# Patient Record
Sex: Female | Born: 1965 | Race: White | Hispanic: No | Marital: Married | Smoking: Never smoker
Health system: Southern US, Community
[De-identification: ages and names within clinical notes are randomized; demographics above are authoritative.]

## PROBLEM LIST (undated history)

## (undated) DIAGNOSIS — D649 Anemia, unspecified: Secondary | ICD-10-CM

## (undated) DIAGNOSIS — D219 Benign neoplasm of connective and other soft tissue, unspecified: Secondary | ICD-10-CM

## (undated) DIAGNOSIS — R112 Nausea with vomiting, unspecified: Secondary | ICD-10-CM

## (undated) DIAGNOSIS — T8859XA Other complications of anesthesia, initial encounter: Secondary | ICD-10-CM

## (undated) DIAGNOSIS — M419 Scoliosis, unspecified: Secondary | ICD-10-CM

## (undated) DIAGNOSIS — C801 Malignant (primary) neoplasm, unspecified: Secondary | ICD-10-CM

## (undated) DIAGNOSIS — I959 Hypotension, unspecified: Secondary | ICD-10-CM

## (undated) DIAGNOSIS — T7840XA Allergy, unspecified, initial encounter: Secondary | ICD-10-CM

## (undated) DIAGNOSIS — R519 Headache, unspecified: Secondary | ICD-10-CM

## (undated) DIAGNOSIS — Z853 Personal history of malignant neoplasm of breast: Secondary | ICD-10-CM

## (undated) DIAGNOSIS — M1990 Unspecified osteoarthritis, unspecified site: Secondary | ICD-10-CM

## (undated) DIAGNOSIS — A048 Other specified bacterial intestinal infections: Secondary | ICD-10-CM

## (undated) DIAGNOSIS — M069 Rheumatoid arthritis, unspecified: Secondary | ICD-10-CM

## (undated) DIAGNOSIS — T4145XA Adverse effect of unspecified anesthetic, initial encounter: Secondary | ICD-10-CM

## (undated) DIAGNOSIS — Z9889 Other specified postprocedural states: Secondary | ICD-10-CM

## (undated) DIAGNOSIS — R51 Headache: Secondary | ICD-10-CM

## (undated) DIAGNOSIS — R011 Cardiac murmur, unspecified: Secondary | ICD-10-CM

## (undated) DIAGNOSIS — K649 Unspecified hemorrhoids: Secondary | ICD-10-CM

## (undated) DIAGNOSIS — E7800 Pure hypercholesterolemia, unspecified: Secondary | ICD-10-CM

## (undated) DIAGNOSIS — L0591 Pilonidal cyst without abscess: Secondary | ICD-10-CM

## (undated) DIAGNOSIS — N939 Abnormal uterine and vaginal bleeding, unspecified: Secondary | ICD-10-CM

## (undated) DIAGNOSIS — R7309 Other abnormal glucose: Secondary | ICD-10-CM

## (undated) DIAGNOSIS — J45909 Unspecified asthma, uncomplicated: Secondary | ICD-10-CM

## (undated) DIAGNOSIS — J3089 Other allergic rhinitis: Secondary | ICD-10-CM

## (undated) DIAGNOSIS — Z5189 Encounter for other specified aftercare: Secondary | ICD-10-CM

## (undated) DIAGNOSIS — Z8619 Personal history of other infectious and parasitic diseases: Secondary | ICD-10-CM

## (undated) HISTORY — DX: Cardiac murmur, unspecified: R01.1

## (undated) HISTORY — DX: Pure hypercholesterolemia, unspecified: E78.00

## (undated) HISTORY — DX: Personal history of malignant neoplasm of breast: Z85.3

## (undated) HISTORY — PX: RHINOPLASTY: SUR1284

## (undated) HISTORY — DX: Personal history of other infectious and parasitic diseases: Z86.19

## (undated) HISTORY — PX: ENDOMETRIAL BIOPSY: SHX622

## (undated) HISTORY — DX: Scoliosis, unspecified: M41.9

## (undated) HISTORY — DX: Abnormal uterine and vaginal bleeding, unspecified: N93.9

## (undated) HISTORY — PX: WISDOM TOOTH EXTRACTION: SHX21

## (undated) HISTORY — DX: Other specified postprocedural states: Z98.890

## (undated) HISTORY — DX: Rheumatoid arthritis, unspecified: M06.9

## (undated) HISTORY — DX: Other specified bacterial intestinal infections: A04.8

## (undated) HISTORY — DX: Benign neoplasm of connective and other soft tissue, unspecified: D21.9

## (undated) HISTORY — DX: Pilonidal cyst without abscess: L05.91

## (undated) HISTORY — DX: Anemia, unspecified: D64.9

## (undated) HISTORY — DX: Encounter for other specified aftercare: Z51.89

## (undated) HISTORY — DX: Malignant (primary) neoplasm, unspecified: C80.1

## (undated) HISTORY — PX: ABDOMINAL HYSTERECTOMY: SHX81

## (undated) HISTORY — DX: Nausea with vomiting, unspecified: R11.2

## (undated) HISTORY — DX: Unspecified hemorrhoids: K64.9

## (undated) HISTORY — DX: Hypotension, unspecified: I95.9

## (undated) HISTORY — DX: Allergy, unspecified, initial encounter: T78.40XA

## (undated) HISTORY — PX: FOOT SURGERY: SHX648

## (undated) HISTORY — DX: Other abnormal glucose: R73.09

## (undated) HISTORY — PX: APPENDECTOMY: SHX54

---
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
## 2001-01-31 ENCOUNTER — Other Ambulatory Visit: Admission: RE | Admit: 2001-01-31 | Discharge: 2001-01-31 | Payer: Self-pay | Admitting: Obstetrics and Gynecology

## 2001-09-21 HISTORY — PX: EYE SURGERY: SHX253

## 2002-04-07 ENCOUNTER — Other Ambulatory Visit: Admission: RE | Admit: 2002-04-07 | Discharge: 2002-04-07 | Payer: Self-pay | Admitting: Obstetrics and Gynecology

## 2002-09-21 HISTORY — PX: OTHER SURGICAL HISTORY: SHX169

## 2003-06-25 ENCOUNTER — Other Ambulatory Visit: Admission: RE | Admit: 2003-06-25 | Discharge: 2003-06-25 | Payer: Self-pay | Admitting: Obstetrics & Gynecology

## 2004-01-01 ENCOUNTER — Encounter (INDEPENDENT_AMBULATORY_CARE_PROVIDER_SITE_OTHER): Payer: Self-pay | Admitting: Specialist

## 2004-01-01 ENCOUNTER — Inpatient Hospital Stay (HOSPITAL_COMMUNITY): Admission: RE | Admit: 2004-01-01 | Discharge: 2004-01-04 | Payer: Self-pay | Admitting: Obstetrics and Gynecology

## 2004-08-05 ENCOUNTER — Other Ambulatory Visit: Admission: RE | Admit: 2004-08-05 | Discharge: 2004-08-05 | Payer: Self-pay | Admitting: Obstetrics and Gynecology

## 2005-10-21 ENCOUNTER — Other Ambulatory Visit: Admission: RE | Admit: 2005-10-21 | Discharge: 2005-10-21 | Payer: Self-pay | Admitting: Obstetrics and Gynecology

## 2006-01-19 HISTORY — PX: OTHER SURGICAL HISTORY: SHX169

## 2008-02-23 ENCOUNTER — Encounter: Admission: RE | Admit: 2008-02-23 | Discharge: 2008-02-23 | Payer: Self-pay | Admitting: Obstetrics and Gynecology

## 2008-03-19 ENCOUNTER — Encounter: Admission: RE | Admit: 2008-03-19 | Discharge: 2008-03-19 | Payer: Self-pay | Admitting: Obstetrics and Gynecology

## 2008-03-21 ENCOUNTER — Encounter: Admission: RE | Admit: 2008-03-21 | Discharge: 2008-03-21 | Payer: Self-pay | Admitting: Obstetrics and Gynecology

## 2008-03-21 ENCOUNTER — Encounter (INDEPENDENT_AMBULATORY_CARE_PROVIDER_SITE_OTHER): Payer: Self-pay | Admitting: Diagnostic Radiology

## 2008-03-28 ENCOUNTER — Ambulatory Visit: Payer: Self-pay | Admitting: Hematology

## 2008-04-20 ENCOUNTER — Encounter: Admission: RE | Admit: 2008-04-20 | Discharge: 2008-04-20 | Payer: Self-pay | Admitting: Surgery

## 2008-04-23 ENCOUNTER — Encounter (INDEPENDENT_AMBULATORY_CARE_PROVIDER_SITE_OTHER): Payer: Self-pay | Admitting: Surgery

## 2008-04-23 ENCOUNTER — Encounter: Admission: RE | Admit: 2008-04-23 | Discharge: 2008-04-23 | Payer: Self-pay | Admitting: Surgery

## 2008-04-23 ENCOUNTER — Ambulatory Visit (HOSPITAL_BASED_OUTPATIENT_CLINIC_OR_DEPARTMENT_OTHER): Admission: RE | Admit: 2008-04-23 | Discharge: 2008-04-23 | Payer: Self-pay | Admitting: Surgery

## 2008-04-23 HISTORY — PX: BREAST LUMPECTOMY W/ NEEDLE LOCALIZATION: SHX1266

## 2008-04-24 ENCOUNTER — Ambulatory Visit: Payer: Self-pay | Admitting: Oncology

## 2008-05-09 LAB — CBC WITH DIFFERENTIAL/PLATELET
Eosinophils Absolute: 0.4 10*3/uL (ref 0.0–0.5)
HCT: 30.6 % — ABNORMAL LOW (ref 34.8–46.6)
HGB: 10.1 g/dL — ABNORMAL LOW (ref 11.6–15.9)
LYMPH%: 29.8 % (ref 14.0–48.0)
MONO#: 0.3 10*3/uL (ref 0.1–0.9)
NEUT#: 2.7 10*3/uL (ref 1.5–6.5)
NEUT%: 55.6 % (ref 39.6–76.8)
Platelets: 278 10*3/uL (ref 145–400)
WBC: 4.9 10*3/uL (ref 3.9–10.0)
lymph#: 1.5 10*3/uL (ref 0.9–3.3)

## 2008-05-10 ENCOUNTER — Ambulatory Visit: Admission: RE | Admit: 2008-05-10 | Discharge: 2008-06-05 | Payer: Self-pay | Admitting: Radiation Oncology

## 2008-05-10 LAB — COMPREHENSIVE METABOLIC PANEL
ALT: 23 U/L (ref 0–35)
CO2: 27 mEq/L (ref 19–32)
Calcium: 9.6 mg/dL (ref 8.4–10.5)
Chloride: 105 mEq/L (ref 96–112)
Creatinine, Ser: 0.76 mg/dL (ref 0.40–1.20)
Glucose, Bld: 63 mg/dL — ABNORMAL LOW (ref 70–99)
Total Bilirubin: 0.8 mg/dL (ref 0.3–1.2)
Total Protein: 6.9 g/dL (ref 6.0–8.3)

## 2008-05-10 LAB — LACTATE DEHYDROGENASE: LDH: 147 U/L (ref 94–250)

## 2008-05-10 LAB — CANCER ANTIGEN 27.29: CA 27.29: 36 U/mL (ref 0–39)

## 2008-05-15 ENCOUNTER — Ambulatory Visit: Admission: RE | Admit: 2008-05-15 | Discharge: 2008-05-15 | Payer: Self-pay | Admitting: Oncology

## 2008-05-15 ENCOUNTER — Encounter: Payer: Self-pay | Admitting: Oncology

## 2008-05-17 ENCOUNTER — Encounter (INDEPENDENT_AMBULATORY_CARE_PROVIDER_SITE_OTHER): Payer: Self-pay | Admitting: Surgery

## 2008-05-17 ENCOUNTER — Ambulatory Visit (HOSPITAL_BASED_OUTPATIENT_CLINIC_OR_DEPARTMENT_OTHER): Admission: RE | Admit: 2008-05-17 | Discharge: 2008-05-17 | Payer: Self-pay | Admitting: Surgery

## 2008-05-17 HISTORY — PX: BREAST LUMPECTOMY: SHX2

## 2008-05-17 HISTORY — PX: PORTACATH PLACEMENT: SHX2246

## 2008-05-31 LAB — CBC WITH DIFFERENTIAL/PLATELET
Basophils Absolute: 0 10*3/uL (ref 0.0–0.1)
Eosinophils Absolute: 0 10*3/uL (ref 0.0–0.5)
HGB: 11.2 g/dL — ABNORMAL LOW (ref 11.6–15.9)
LYMPH%: 11.1 % — ABNORMAL LOW (ref 14.0–48.0)
MONO#: 0.3 10*3/uL (ref 0.1–0.9)
NEUT#: 7.2 10*3/uL — ABNORMAL HIGH (ref 1.5–6.5)
Platelets: 237 10*3/uL (ref 145–400)
RBC: 4.07 10*6/uL (ref 3.70–5.32)
RDW: 18.2 % — ABNORMAL HIGH (ref 11.3–14.5)
WBC: 8.4 10*3/uL (ref 3.9–10.0)

## 2008-06-08 LAB — CBC WITH DIFFERENTIAL/PLATELET
Eosinophils Absolute: 0.1 10*3/uL (ref 0.0–0.5)
HCT: 32.2 % — ABNORMAL LOW (ref 34.8–46.6)
LYMPH%: 5.2 % — ABNORMAL LOW (ref 14.0–48.0)
MONO#: 0.6 10*3/uL (ref 0.1–0.9)
NEUT#: 27 10*3/uL — ABNORMAL HIGH (ref 1.5–6.5)
NEUT%: 92.3 % — ABNORMAL HIGH (ref 39.6–76.8)
Platelets: 193 10*3/uL (ref 145–400)
WBC: 29.3 10*3/uL — ABNORMAL HIGH (ref 3.9–10.0)

## 2008-06-14 ENCOUNTER — Ambulatory Visit: Payer: Self-pay | Admitting: Oncology

## 2008-06-20 LAB — CBC & DIFF AND RETIC
Basophils Absolute: 0 10*3/uL (ref 0.0–0.1)
Eosinophils Absolute: 0 10*3/uL (ref 0.0–0.5)
HCT: 30.5 % — ABNORMAL LOW (ref 34.8–46.6)
HGB: 10.4 g/dL — ABNORMAL LOW (ref 11.6–15.9)
IRF: 0.21 (ref 0.130–0.330)
LYMPH%: 26.9 % (ref 14.0–48.0)
MCHC: 34.2 g/dL (ref 32.0–36.0)
MONO#: 0.4 10*3/uL (ref 0.1–0.9)
NEUT#: 2.5 10*3/uL (ref 1.5–6.5)
NEUT%: 62.6 % (ref 39.6–76.8)
Platelets: 283 10*3/uL (ref 145–400)
WBC: 4 10*3/uL (ref 3.9–10.0)

## 2008-06-28 LAB — CBC WITH DIFFERENTIAL/PLATELET
EOS%: 0.7 % (ref 0.0–7.0)
HGB: 10.6 g/dL — ABNORMAL LOW (ref 11.6–15.9)
MCH: 28.9 pg (ref 26.0–34.0)
MCV: 83 fL (ref 81.0–101.0)
MONO%: 5.6 % (ref 0.0–13.0)
NEUT#: 18.6 10*3/uL — ABNORMAL HIGH (ref 1.5–6.5)
RBC: 3.68 10*6/uL — ABNORMAL LOW (ref 3.70–5.32)
RDW: 18.7 % — ABNORMAL HIGH (ref 11.3–14.5)
lymph#: 1.8 10*3/uL (ref 0.9–3.3)

## 2008-07-12 LAB — CBC & DIFF AND RETIC
Basophils Absolute: 0.1 10*3/uL (ref 0.0–0.1)
Eosinophils Absolute: 0 10*3/uL (ref 0.0–0.5)
HGB: 11.2 g/dL — ABNORMAL LOW (ref 11.6–15.9)
IRF: 0.31 (ref 0.130–0.330)
MONO#: 0.6 10*3/uL (ref 0.1–0.9)
NEUT#: 7 10*3/uL — ABNORMAL HIGH (ref 1.5–6.5)
RBC: 3.72 10*6/uL (ref 3.70–5.32)
RDW: 25 % — ABNORMAL HIGH (ref 11.3–14.5)
RETIC #: 86.3 10*3/uL (ref 19.7–115.1)
Retic %: 2.3 % (ref 0.4–2.3)
WBC: 8.9 10*3/uL (ref 3.9–10.0)
lymph#: 1.2 10*3/uL (ref 0.9–3.3)

## 2008-07-20 LAB — CBC WITH DIFFERENTIAL/PLATELET
Eosinophils Absolute: 0.1 10*3/uL (ref 0.0–0.5)
MCV: 86 fL (ref 81.0–101.0)
MONO#: 1.5 10*3/uL — ABNORMAL HIGH (ref 0.1–0.9)
MONO%: 5.7 % (ref 0.0–13.0)
NEUT#: 22.6 10*3/uL — ABNORMAL HIGH (ref 1.5–6.5)
RBC: 3.65 10*6/uL — ABNORMAL LOW (ref 3.70–5.32)
RDW: 18.3 % — ABNORMAL HIGH (ref 11.3–14.5)
WBC: 26.2 10*3/uL — ABNORMAL HIGH (ref 3.9–10.0)
lymph#: 1.8 10*3/uL (ref 0.9–3.3)

## 2008-07-31 ENCOUNTER — Ambulatory Visit: Payer: Self-pay | Admitting: Oncology

## 2008-08-02 LAB — CBC WITH DIFFERENTIAL/PLATELET
BASO%: 0.1 % (ref 0.0–2.0)
Basophils Absolute: 0 10*3/uL (ref 0.0–0.1)
EOS%: 0.1 % (ref 0.0–7.0)
HCT: 33.8 % — ABNORMAL LOW (ref 34.8–46.6)
HGB: 11.6 g/dL (ref 11.6–15.9)
MCH: 30.5 pg (ref 26.0–34.0)
MCHC: 34.2 g/dL (ref 32.0–36.0)
MCV: 89.1 fL (ref 81.0–101.0)
MONO%: 2.9 % (ref 0.0–13.0)
NEUT%: 83.2 % — ABNORMAL HIGH (ref 39.6–76.8)
RDW: 18.8 % — ABNORMAL HIGH (ref 11.3–14.5)
lymph#: 1 10*3/uL (ref 0.9–3.3)

## 2008-08-09 LAB — MANUAL DIFFERENTIAL
ALC: 1.4 10*3/uL (ref 0.9–3.3)
ANC (CHCC manual diff): 6.1 10*3/uL (ref 1.5–6.5)
Basophil: 2 % (ref 0–2)
Blasts: 0 % (ref 0–0)
Metamyelocytes: 4 % — ABNORMAL HIGH (ref 0–0)
Myelocytes: 1 % — ABNORMAL HIGH (ref 0–0)
PLT EST: DECREASED
PROMYELO: 0 % (ref 0–0)
Variant Lymph: 0 % (ref 0–0)

## 2008-08-09 LAB — CBC WITH DIFFERENTIAL/PLATELET
MCH: 31.3 pg (ref 26.0–34.0)
MCHC: 35.8 g/dL (ref 32.0–36.0)
RDW: 16.4 % — ABNORMAL HIGH (ref 11.3–14.5)

## 2008-08-14 ENCOUNTER — Encounter: Payer: Self-pay | Admitting: Oncology

## 2008-08-14 ENCOUNTER — Ambulatory Visit: Admission: RE | Admit: 2008-08-14 | Discharge: 2008-08-14 | Payer: Self-pay | Admitting: Oncology

## 2008-08-21 DIAGNOSIS — C801 Malignant (primary) neoplasm, unspecified: Secondary | ICD-10-CM

## 2008-08-21 HISTORY — DX: Malignant (primary) neoplasm, unspecified: C80.1

## 2008-08-23 LAB — CBC & DIFF AND RETIC
Basophils Absolute: 0.1 10*3/uL (ref 0.0–0.1)
EOS%: 1.5 % (ref 0.0–7.0)
Eosinophils Absolute: 0.1 10*3/uL (ref 0.0–0.5)
HCT: 34.1 % — ABNORMAL LOW (ref 34.8–46.6)
HGB: 11.9 g/dL (ref 11.6–15.9)
IRF: 0.37 — ABNORMAL HIGH (ref 0.130–0.330)
MCH: 32 pg (ref 26.0–34.0)
MONO#: 0.5 10*3/uL (ref 0.1–0.9)
NEUT#: 4.5 10*3/uL (ref 1.5–6.5)
NEUT%: 70.5 % (ref 39.6–76.8)
RDW: 18 % — ABNORMAL HIGH (ref 11.3–14.5)
RETIC #: 62 10*3/uL (ref 19.7–115.1)
lymph#: 1.2 10*3/uL (ref 0.9–3.3)

## 2008-09-11 ENCOUNTER — Ambulatory Visit (HOSPITAL_COMMUNITY): Admission: RE | Admit: 2008-09-11 | Discharge: 2008-09-12 | Payer: Self-pay | Admitting: Surgery

## 2008-09-11 ENCOUNTER — Encounter (INDEPENDENT_AMBULATORY_CARE_PROVIDER_SITE_OTHER): Payer: Self-pay | Admitting: Surgery

## 2008-09-11 HISTORY — PX: INSERTION OF TISSUE EXPANDER AFTER MASTECTOMY: SHX1831

## 2008-09-11 HISTORY — PX: MASTECTOMY: SHX3

## 2008-09-17 ENCOUNTER — Ambulatory Visit: Payer: Self-pay | Admitting: Oncology

## 2008-09-19 LAB — COMPREHENSIVE METABOLIC PANEL
AST: 17 U/L (ref 0–37)
Albumin: 3.7 g/dL (ref 3.5–5.2)
BUN: 11 mg/dL (ref 6–23)
CO2: 29 mEq/L (ref 19–32)
Calcium: 8.8 mg/dL (ref 8.4–10.5)
Chloride: 103 mEq/L (ref 96–112)
Creatinine, Ser: 0.66 mg/dL (ref 0.40–1.20)
Potassium: 4.1 mEq/L (ref 3.5–5.3)

## 2008-09-19 LAB — CANCER ANTIGEN 27.29: CA 27.29: 21 U/mL (ref 0–39)

## 2008-09-19 LAB — CBC WITH DIFFERENTIAL/PLATELET
Basophils Absolute: 0 10*3/uL (ref 0.0–0.1)
Eosinophils Absolute: 0.8 10*3/uL — ABNORMAL HIGH (ref 0.0–0.5)
HCT: 31.9 % — ABNORMAL LOW (ref 34.8–46.6)
HGB: 11.2 g/dL — ABNORMAL LOW (ref 11.6–15.9)
MONO#: 0.3 10*3/uL (ref 0.1–0.9)
NEUT#: 3.9 10*3/uL (ref 1.5–6.5)
NEUT%: 64.1 % (ref 39.6–76.8)
RDW: 13.5 % (ref 11.3–14.5)
WBC: 6.1 10*3/uL (ref 3.9–10.0)
lymph#: 1.1 10*3/uL (ref 0.9–3.3)

## 2008-09-21 HISTORY — PX: KNEE ARTHROSCOPY W/ MENISCAL REPAIR: SHX1877

## 2008-10-02 LAB — ESTRADIOL, ULTRA SENS

## 2008-10-24 ENCOUNTER — Ambulatory Visit: Admission: RE | Admit: 2008-10-24 | Discharge: 2008-10-24 | Payer: Self-pay | Admitting: Oncology

## 2008-10-24 ENCOUNTER — Encounter: Payer: Self-pay | Admitting: Oncology

## 2008-10-24 ENCOUNTER — Ambulatory Visit: Payer: Self-pay | Admitting: Cardiovascular Disease

## 2008-10-29 ENCOUNTER — Ambulatory Visit: Payer: Self-pay | Admitting: Oncology

## 2008-11-29 LAB — URIC ACID: Uric Acid, Serum: 5.2 mg/dL (ref 2.4–7.0)

## 2008-12-19 ENCOUNTER — Ambulatory Visit: Payer: Self-pay | Admitting: Oncology

## 2008-12-20 HISTORY — PX: REMOVAL OF TISSUE EXPANDER AND PLACEMENT OF IMPLANT: SHX6457

## 2008-12-24 ENCOUNTER — Ambulatory Visit (HOSPITAL_COMMUNITY): Admission: RE | Admit: 2008-12-24 | Discharge: 2008-12-24 | Payer: Self-pay | Admitting: Oncology

## 2008-12-24 LAB — COMPREHENSIVE METABOLIC PANEL
ALT: 15 U/L (ref 0–35)
AST: 17 U/L (ref 0–37)
Albumin: 4.1 g/dL (ref 3.5–5.2)
Alkaline Phosphatase: 53 U/L (ref 39–117)
Calcium: 9.3 mg/dL (ref 8.4–10.5)
Chloride: 106 mEq/L (ref 96–112)
Creatinine, Ser: 0.69 mg/dL (ref 0.40–1.20)
Potassium: 3.9 mEq/L (ref 3.5–5.3)

## 2008-12-24 LAB — CBC WITH DIFFERENTIAL/PLATELET
BASO%: 0.2 % (ref 0.0–2.0)
EOS%: 2.7 % (ref 0.0–7.0)
MCH: 31.2 pg (ref 25.1–34.0)
MCHC: 34.8 g/dL (ref 31.5–36.0)
MONO%: 7.2 % (ref 0.0–14.0)
RDW: 14.4 % (ref 11.2–14.5)
lymph#: 1.6 10*3/uL (ref 0.9–3.3)

## 2008-12-31 ENCOUNTER — Encounter: Admission: RE | Admit: 2008-12-31 | Discharge: 2008-12-31 | Payer: Self-pay | Admitting: Orthopedic Surgery

## 2009-01-23 LAB — RESEARCH LABS

## 2009-01-23 LAB — HCG, SERUM, QUALITATIVE: Preg, Serum: NEGATIVE

## 2009-02-11 ENCOUNTER — Ambulatory Visit: Payer: Self-pay | Admitting: Oncology

## 2009-03-22 ENCOUNTER — Ambulatory Visit: Payer: Self-pay | Admitting: Oncology

## 2009-04-17 ENCOUNTER — Ambulatory Visit (HOSPITAL_COMMUNITY): Admission: RE | Admit: 2009-04-17 | Discharge: 2009-04-17 | Payer: Self-pay | Admitting: Oncology

## 2009-04-18 LAB — COMPREHENSIVE METABOLIC PANEL
Albumin: 3.5 g/dL (ref 3.5–5.2)
CO2: 28 mEq/L (ref 19–32)
Calcium: 8.6 mg/dL (ref 8.4–10.5)
Chloride: 107 mEq/L (ref 96–112)
Glucose, Bld: 80 mg/dL (ref 70–99)
Potassium: 3.9 mEq/L (ref 3.5–5.3)
Sodium: 139 mEq/L (ref 135–145)
Total Protein: 6.2 g/dL (ref 6.0–8.3)

## 2009-04-18 LAB — CBC WITH DIFFERENTIAL/PLATELET
Eosinophils Absolute: 0.1 10*3/uL (ref 0.0–0.5)
LYMPH%: 34.1 % (ref 14.0–49.7)
MONO#: 0.4 10*3/uL (ref 0.1–0.9)
NEUT#: 2 10*3/uL (ref 1.5–6.5)
Platelets: 177 10*3/uL (ref 145–400)
RBC: 3.79 10*6/uL (ref 3.70–5.45)
RDW: 13 % (ref 11.2–14.5)
WBC: 3.8 10*3/uL — ABNORMAL LOW (ref 3.9–10.3)

## 2009-05-07 ENCOUNTER — Ambulatory Visit: Payer: Self-pay | Admitting: Oncology

## 2009-05-30 LAB — CBC WITH DIFFERENTIAL/PLATELET
Basophils Absolute: 0 10*3/uL (ref 0.0–0.1)
Eosinophils Absolute: 0.2 10*3/uL (ref 0.0–0.5)
HCT: 37.9 % (ref 34.8–46.6)
HGB: 13 g/dL (ref 11.6–15.9)
LYMPH%: 30.4 % (ref 14.0–49.7)
MCV: 95.8 fL (ref 79.5–101.0)
MONO%: 9.8 % (ref 0.0–14.0)
NEUT#: 2.2 10*3/uL (ref 1.5–6.5)
NEUT%: 54.9 % (ref 38.4–76.8)
Platelets: 187 10*3/uL (ref 145–400)

## 2009-05-30 LAB — COMPREHENSIVE METABOLIC PANEL
Albumin: 3.7 g/dL (ref 3.5–5.2)
Alkaline Phosphatase: 39 U/L (ref 39–117)
BUN: 11 mg/dL (ref 6–23)
Glucose, Bld: 53 mg/dL — ABNORMAL LOW (ref 70–99)
Potassium: 4.4 mEq/L (ref 3.5–5.3)

## 2009-06-17 ENCOUNTER — Ambulatory Visit: Payer: Self-pay | Admitting: Oncology

## 2009-08-28 ENCOUNTER — Ambulatory Visit (HOSPITAL_BASED_OUTPATIENT_CLINIC_OR_DEPARTMENT_OTHER): Admission: RE | Admit: 2009-08-28 | Discharge: 2009-08-28 | Payer: Self-pay | Admitting: Surgery

## 2009-09-06 ENCOUNTER — Ambulatory Visit (HOSPITAL_COMMUNITY): Admission: RE | Admit: 2009-09-06 | Discharge: 2009-09-06 | Payer: Self-pay | Admitting: Oncology

## 2009-09-06 ENCOUNTER — Ambulatory Visit: Payer: Self-pay | Admitting: Oncology

## 2009-09-09 HISTORY — PX: PORT-A-CATH REMOVAL: SHX5289

## 2009-09-09 HISTORY — PX: UMBILICAL HERNIA REPAIR: SHX196

## 2009-09-10 LAB — COMPREHENSIVE METABOLIC PANEL
Albumin: 3.8 g/dL (ref 3.5–5.2)
Alkaline Phosphatase: 36 U/L — ABNORMAL LOW (ref 39–117)
BUN: 8 mg/dL (ref 6–23)
CO2: 30 mEq/L (ref 19–32)
Chloride: 106 mEq/L (ref 96–112)
Glucose, Bld: 108 mg/dL — ABNORMAL HIGH (ref 70–99)
Potassium: 3.7 mEq/L (ref 3.5–5.3)
Total Protein: 6.6 g/dL (ref 6.0–8.3)

## 2009-09-10 LAB — CBC WITH DIFFERENTIAL/PLATELET
Basophils Absolute: 0 10*3/uL (ref 0.0–0.1)
Eosinophils Absolute: 0.2 10*3/uL (ref 0.0–0.5)
HGB: 13.9 g/dL (ref 11.6–15.9)
MCV: 94.9 fL (ref 79.5–101.0)
MONO#: 0.3 10*3/uL (ref 0.1–0.9)
MONO%: 7.3 % (ref 0.0–14.0)
NEUT#: 2.5 10*3/uL (ref 1.5–6.5)
RDW: 13.6 % (ref 11.2–14.5)
WBC: 4.1 10*3/uL (ref 3.9–10.3)

## 2010-06-27 ENCOUNTER — Ambulatory Visit: Payer: Self-pay | Admitting: Oncology

## 2010-07-01 ENCOUNTER — Ambulatory Visit (HOSPITAL_COMMUNITY): Admission: RE | Admit: 2010-07-01 | Discharge: 2010-07-01 | Payer: Self-pay | Admitting: Oncology

## 2010-07-01 LAB — COMPREHENSIVE METABOLIC PANEL
ALT: 20 U/L (ref 0–35)
AST: 20 U/L (ref 0–37)
Albumin: 4.2 g/dL (ref 3.5–5.2)
Calcium: 9.6 mg/dL (ref 8.4–10.5)
Chloride: 105 mEq/L (ref 96–112)
Potassium: 4.1 mEq/L (ref 3.5–5.3)
Total Protein: 6.5 g/dL (ref 6.0–8.3)

## 2010-07-01 LAB — CBC WITH DIFFERENTIAL/PLATELET
BASO%: 0.5 % (ref 0.0–2.0)
EOS%: 3 % (ref 0.0–7.0)
HGB: 13.1 g/dL (ref 11.6–15.9)
MCH: 33.4 pg (ref 25.1–34.0)
MCHC: 35 g/dL (ref 31.5–36.0)
RDW: 12.5 % (ref 11.2–14.5)
WBC: 4.5 10*3/uL (ref 3.9–10.3)
lymph#: 1.6 10*3/uL (ref 0.9–3.3)

## 2010-09-10 ENCOUNTER — Ambulatory Visit: Payer: Self-pay | Admitting: Oncology

## 2010-09-10 LAB — CBC WITH DIFFERENTIAL/PLATELET
Basophils Absolute: 0 10*3/uL (ref 0.0–0.1)
EOS%: 4.8 % (ref 0.0–7.0)
HGB: 13.3 g/dL (ref 11.6–15.9)
MCH: 33.3 pg (ref 25.1–34.0)
MCV: 94.6 fL (ref 79.5–101.0)
MONO%: 9.4 % (ref 0.0–14.0)
RBC: 3.99 10*6/uL (ref 3.70–5.45)
RDW: 12.9 % (ref 11.2–14.5)

## 2010-09-11 LAB — COMPREHENSIVE METABOLIC PANEL
ALT: 16 U/L (ref 0–35)
AST: 18 U/L (ref 0–37)
Albumin: 4 g/dL (ref 3.5–5.2)
Alkaline Phosphatase: 38 U/L — ABNORMAL LOW (ref 39–117)
BUN: 11 mg/dL (ref 6–23)
CO2: 25 mEq/L (ref 19–32)
Calcium: 8.9 mg/dL (ref 8.4–10.5)
Chloride: 105 mEq/L (ref 96–112)
Creatinine, Ser: 0.72 mg/dL (ref 0.40–1.20)
Glucose, Bld: 87 mg/dL (ref 70–99)
Potassium: 4.1 mEq/L (ref 3.5–5.3)
Sodium: 139 mEq/L (ref 135–145)
Total Bilirubin: 0.6 mg/dL (ref 0.3–1.2)
Total Protein: 5.9 g/dL — ABNORMAL LOW (ref 6.0–8.3)

## 2010-09-11 LAB — FOLLICLE STIMULATING HORMONE: FSH: 35.4 m[IU]/mL

## 2010-09-11 LAB — CANCER ANTIGEN 27.29: CA 27.29: 25 U/mL (ref 0–39)

## 2010-09-11 LAB — LUTEINIZING HORMONE: LH: 28.8 m[IU]/mL

## 2010-09-11 LAB — VITAMIN D 25 HYDROXY (VIT D DEFICIENCY, FRACTURES): Vit D, 25-Hydroxy: 42 ng/mL (ref 30–89)

## 2010-09-18 LAB — ESTRADIOL, ULTRA SENS

## 2010-12-23 LAB — POCT HEMOGLOBIN-HEMACUE: Hemoglobin: 13.2 g/dL (ref 12.0–15.0)

## 2011-02-03 NOTE — Op Note (Signed)
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   OPERATION:  Needle-guided excision of left breast cancer with blue dye  injection and axillary sentinel lymph node biopsy.   SURGEON:  Currie Paris, M.D.   ANESTHESIA:  General.   CLINICAL HISTORY:  This is a 45 year old lady who was recently found  (basically by an MRI) to have a breast cancer in upper outer quadrant is  about 1.2 cm in size.  She has very dense breast tissue and the mass was  neither palpable nor visible to me on mammography.  After discussion  with the patient and review of her situation with a genetic counselor,  we elected to proceed to needle-guided wide local excision with axillary  sentinel lymph node biopsy.   DESCRIPTION OF PROCEDURE:  The patient was seen in the holding area and  she had no further questions.  We both initialed the left breast as the  operative side.  I reviewed the mammogram films.  The guidewire appeared  to enter the upper outer quadrant and tracked somewhat medially and  superiorly.  There was an X on the skin overlying the approximate  tumor.  This was somewhat inferior to the procedure tract of the  guidewire with the patient lying supine and her arm out.   The patient was then taken to the operating room.  After satisfactory  general anesthesia had been obtained, the time-out was done.  I then  injected the left breast nipple-areolar area with 5 mL dilute methylene  blue and massaged this in.  Entire  breast was prepped and draped.   I started with the axillary sentinel lymph node biopsy.  I used a  NeoProbe to identify a hot area and made a transverse incision.  Divided  a little bit of the subcutaneous tissues and almost immediately found a  blue lymphatic leading to a blue lymph node, which as I started to  dissect it out, realized it was really a cluster of three nodes together  two of  which had blue dye and one of which was just hot.  I removed all  three and the counts in the hottest was up to 3500.  With those out,  there were no other high counts, any other palpable abnormalities, and  no other blue lymphatics noted.  There was a little leakage of blue  fluid from the primary lymphatic entering this node cluster, and I put a  clip on that.  At the end of the case, we put some Marcaine and closed  with 3-0 Vicryl, 4-0 Monocryl subcuticular, and eventually  Dermabond.  Pathology later at the end of the case had reported that the sentinel  nodes were negative.   While awaiting for the node results, the lumpectomy was done.  I made a  curvilinear incision with the lower lateral port being the guidewire  entry side and a curving over the procedure tract of the guidewire.  I  then raised some skin flaps medially and out towards the axilla.  I then  raised a little bit lateral to the guidewire so I could get underneath  it and then using some Allis clamps on the tissue, took a wide area of  what I thought was the tract of the guidewire including the fascia deep.  I got well medial to where I thought the tip of the wire was and  completed the dissection.  This tissue was extraordinarily dense fibrous  breast tissue with multiple fairly large blood vessels, which had to be  coagulated or tied.  I was indeed past its tip of the guidewire and did  a specimen mammogram.  It was oriented in the tray from anterior to  posterior and the guidewire and the clip overlay each other, so I   thought we were well around this.  That was also the thought of the  radiologist, Dr. Cain Saupe.   Once this was out, I went ahead and made sure everything was dry,  spending several minutes irrigating, coagulating, etc.  I put Marcaine  in here as well and then closed a little bit of the breast tissue deep  as I could with some 3-0 Vicryl and then the more superficial with more  3-0 Vicryl followed by 4-0 Monocryl subcuticular, and Dermabond.  The  patient tolerated the procedure well and there were no operative  complications.  All counts were correct.      Currie Paris, M.D.  Electronically Signed     CJS/MEDQ  D:  04/23/2008  T:  04/24/2008  Job:  [id number]   cc:   Randye Lobo, M.D.

## 2011-02-03 NOTE — Op Note (Signed)
NAME:  [place], [facility]                 ACCOUNT NO.:  [id number]   MEDICAL RECORD NO.:  [id number]          PATIENT TYPE:  OIB   LOCATION:  5114                         FACILITY:  [facility]   PHYSICIAN:  Etter Sjogren, M.D.     DATE OF BIRTH:  05/01/1966   DATE OF PROCEDURE:  DATE OF DISCHARGE:                               OPERATIVE REPORT   PREOPERATIVE DIAGNOSIS:  Breast cancer with bilateral mastectomy.   POSTOPERATIVE DIAGNOSIS:  Breast cancer with bilateral mastectomy.   PROCEDURE PERFORMED:  Bilateral breast reconstruction with tissue  expanders.   SURGEON:  Etter Sjogren, MD   ANESTHESIA:  General.   ESTIMATED BLOOD LOSS:  100 mL.   DRAINS:  Two Blake drains left on each side.   CLINICAL NOTE:  A 45 year old woman has had breast cancer and had  bilateral mastectomy and requested reconstruction at the time of the  planned mastectomies.  Options discussed.  She selected to use tissue  expanders followed as a planned stage procedure by placement of  implants.  These procedures were discussed with her in great detail,  risks plus complications, overall convalescence, risks included, but not  limited to bleeding, infection, anesthesia complications, healing  problems, scarring, fluid collections, loss of sensation, displacing the  expanders, asymmetry, failure of the tissue expanders, capsular  contracture, disappointment, and the deleterious effects of radiation  for only the tissue expanders, if in fact, radiation proved to be  necessary postoperatively.  She understood all of this and wished to  proceed.   SUMMARY:  The patient had inframammary folds marked in the office the  day prior to the procedure.  The mastectomies had been completed and the  dissection was then carried deep to the pectoralis major muscles.  Submuscular pocket was developed with full muscle coverage, dissected  down to the level of the inframammary folds that had been marked  preoperatively.  Hemostasis  with electrocautery in great care was taken  to avoid damage to the underlying chest cavity during this dissection.  Thorough irrigation with saline, meticulous hemostasis with  electrocautery.  The expanders were prepared.  These were Allergan  tissue expanders and the 500 mL, style and the lot numbers on the  left side [id number] and on the right side [id number], 150 mL sterile saline  placed using a closed filling system for the right side, 100 mL for the  left side.  Skin was little bit tighter on the left as compared to the  right, and all the air was removed and expanders were soaked with  antibiotic solution and the submuscular space was soaked with antibiotic  solution as well.  The breast weight was little over 300 g on the left  side, under 300 g on the right side.  The expanders were positioned  after confirming excellent hemostasis.  Blake drains had already been  positioned, brought through separate stab wounds inferiorly.  One of  these left in the submuscular pocket.  The other placed under the  mastectomy flaps.  These were secured at the skin level with 3-0 Prolene  sutures.  The muscle was then closed using 3-0 Vicryl figure-of-eight  interrupted sutures.  Great care had been taken to avoid damaged  underlying tissue expanders.  The remainder of the skin closure with 3-0  Monocryl interrupted inverted deep dermal sutures and running 3-0  Monocryl subcuticular suture.  The skin flaps appeared to have excellent  color and bright  red bleeding along the periphery consisting viability and there is no  evidence of any vascular compromise at the present time for the  mastectomy flaps.  Steri-Strips, dry sterile dressing, and Ace wrap were  applied and she was transferred to the recovery room stable having  tolerated the procedure well.      Etter Sjogren, M.D.  Electronically Signed     DB/MEDQ  D:  09/11/2008  T:  09/12/2008  Job:  [id number]

## 2011-02-03 NOTE — Op Note (Signed)
NAME:  Rebecca Fleming, [facility]                 ACCOUNT NO.:  [id number]   MEDICAL RECORD NO.:  [id number]          PATIENT TYPE:  OIB   LOCATION:  5114                         FACILITY:  [facility]   PHYSICIAN:  Currie Paris, M.D.DATE OF BIRTH:  28-Jul-1966   DATE OF PROCEDURE:  09/11/2008  DATE OF DISCHARGE:                               OPERATIVE REPORT   PREOPERATIVE DIAGNOSIS:  Carcinoma, left breast, upper outer quadrant,  status post lumpectomy and chemotherapy.   POSTOPERATIVE DIAGNOSIS:  Carcinoma, left breast, upper outer quadrant,  status post lumpectomy and chemotherapy.   OPERATION:  Bilateral total mastectomy.   SURGEON:  Currie Paris, MD   ASSISTANT:  Clovis Pu. Cornett, MD   ANESTHESIA:  General endotracheal.   CLINICAL HISTORY:  Rebecca Fleming has had a lumpectomy for breast cancer in  the upper outer quadrant of the left breast.  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  Following discussion with the  patient, she elected to have a mastectomy after her chemotherapy as well  as a prophylactic mastectomy on the right side.   DESCRIPTION OF PROCEDURE:  The patient was seen in the holding area.  She had no further questions.  We confirmed bilateral mastectomy as the  planned procedure and confirmed that we planned to leave the port in  situ, so she could complete her Herceptin therapy.   Preoperatively, the patient was taken to the operating room, and after  satisfactory general endotracheal anesthesia had been obtained, both  breasts were prepped and draped as a sterile field.  The time-out was  done.   We had marked inframammary folda and the midline and the outline of the  breast.  I started on the right side and made an incision around the  areola and then extended it medially and laterally, but the only skin we  took was that of the areola and nipple complex.  I raised skin  flaps in  the usual fashion superiorly towards the clavicle, medially to the  sternum, inferiorly to the inframammary fold, and laterally to the  latissimus.  The breast was removed with coagulation current cautery  starting medially and working laterally and we did not enter the axilla.  She was fairly vascular and we spent a lot of time controlling blood  vessels with cautery.   Once this was done and dry, I put a moist lap.  I turned my attention to  the left breast.  Here, I made more elliptical incision to include the  upper outer quadrant scar from prior lumpectomy.  Again, skin flaps were  raised as on the right and the breast was removed from medial to  lateral.  I did not enter the axilla, but we did not feel any  abnormalities up there and she already had a sentinel node evaluation  preop at one of her other surgeries.  Again, we  made sure everything was dry, placed a moist lap.  At this point, we  waited for  Dr. Odis Luster to come in to complete the reconstructions.  Estimated blood loss from operative portion of the procedure was about  200 mL.  There were no complications.      Currie Paris, M.D.  Electronically Signed     CJS/MEDQ  D:  09/11/2008  T:  09/11/2008  Job:  [id number]

## 2011-02-03 NOTE — Op Note (Signed)
NAME:  P[place], [facility]                 ACCOUNT NO.:  [id number]   MEDICAL RECORD NO.:  [id number]          PATIENT TYPE:  AMB   LOCATION:  DSC                          FACILITY:  [facility]   PHYSICIAN:  Currie Paris, M.D.DATE OF BIRTH:  [birth year]   DATE OF PROCEDURE:  05/17/2008  DATE OF DISCHARGE:                               OPERATIVE REPORT   PREOPERATIVE DIAGNOSIS:  Carcinoma left breast stage I upper outer  quadrant.   POSTOPERATIVE DIAGNOSIS:  Carcinoma left breast stage I upper outer  quadrant.   OPERATION:  Re-excision of left lumpectomy site and placement of Port-A-  Cath right subclavian.   SURGEON:  Currie Paris, MD   ANESTHESIA:  General.   CLINICAL HISTORY:  This is a 45 year old lady who recently found to have  an invasive left breast cancer.  Her margins were somewhat closed and we  decided we would re-excise these at the time of her Port-A-Cath  placement for chemo.   DESCRIPTION OF PROCEDURE:  The patient was seen in the holding area and  she had no further questions.  I identified and marked the right breast  as the operative side for the re-excision and I selected a spot on the  right anterior chest wall for the Port-A-Cath placement.  She had no  further questions about either portion of the procedure.   The patient was taken to the operating room after satisfactory general  anesthesia have been obtained.  The breasts and lower neck were prepped  and draped as a sterile field.  A time-out was done.   I started out by reopening the old scar, using cautery I did go through  a very dense breast tissue, I entered the lumpectomy cavity.  I took  excision of the entire inferior margingoing down to be sure I was down  to fascia for the entire length of the inferior portion of the incision  and then carrying it around the medial portion.  This all looked like  dense fibrotic breast tissue.  I oriented the specimen for pathology for  the new side.   I  spent several minutes irrigating, making sure everything was dry and  put some Marcaine in.  I was able to reapproximate a little breast  tissue deep and then the more superficial tissue leaving cavity to form.  I put 4 baby clips in to mark the 4 corners +1 on the chest wall for the  deep margin.   Skin was then closed with 4-0 Monocryl, subcuticular, and Dermabond.   Using new instruments and gloves etc., the right side was approached.  She was placed in Trendelenburg.  The right subclavian vein was entered  on the initial attempt and the guidewire threaded easily into superior  vena cava.  Because of some of the patient's apparent scoliosis, this  was little disorienting at first looking at the fluoro, but when we had  her tilted to get right atrium projected over the spine, we were able to  see that we in the right atrial area with a guidewire.   I then made  a transverse incision on the anterior chest wall portion,  formed a pocket.  I put some Marcaine in all of this to make sure it was  somewhat numb for postop pain control.   A Port-A-Cath tubing was pulled between the 2 sites and flushed.  The  guidewire tract was dilated once with a dilator peel-away sheath and the  dilator and guidewire were removed.  The Port-A-Cath tubing was threaded  into 20 cm and the peel-away sheath removed.  It aspirated and flushed  easily.   Care using fluoro, I could see I appeared to be in the right atrium and  using the fluoro was able to back this up until what I thought was the  distal superior vena cava just about a vertical body below the  bifurcation of the trachea.  Again this aspirated and flushed easily.  The reservoir was flushed, attached and locking mechanism engaged and  that aspirated and flushed easily.   The reservoir was then secured in the pocket with some Prolene sutures.  A final fluoro check was made and appeared to have good positioning and  no kinks.  It was aspirated  and flushed with concentrated heparin.   The incision was closed with 3-0 Vicryl, 4-0 Monocryl, subcuticular, and  Dermabond.   The patient tolerated the procedure well and there were no  complications.  All counts were correct.      Currie Paris, M.D.  Electronically Signed     CJS/MEDQ  D:  05/17/2008  T:  05/18/2008  Job:  [id number]   cc:   Randye Lobo, M.D.  Valentino Hue. Magrinat, M.D.

## 2011-02-06 NOTE — Op Note (Signed)
NAME:  Rebecca Fleming, [facility]                             ACCOUNT NO.:  [id number]   MEDICAL RECORD NO.:  [id number]                   PATIENT TYPE:  INP   LOCATION:  9313                                 FACILITY:  [facility]   PHYSICIAN:  Randye Lobo, M.D.                DATE OF BIRTH:  Jan 26, 1966   DATE OF PROCEDURE:  01/01/2004  DATE OF DISCHARGE:                                 OPERATIVE REPORT   PREOPERATIVE DIAGNOSES:  1. Intrauterine gestation at 36 + 5 weeks.  2. Twin gestation.  3. Vertex and oblique transverse presentation.  4. Status post in vitro fertilization, surrogate pregnancy.   POSTOPERATIVE DIAGNOSES:  1. Intrauterine gestation at 68 + 5 weeks.  2. Twin gestation.  3. Vertex and oblique transverse presentation.  4. Status post in vitro fertilization with surrogate pregnancy.  5. Parasitic fibroid.   PROCEDURES:  1. Primary low segment transverse cesarean section.  2. Removal of parasitic fibroid.   SURGEON:  Randye Lobo, M.D.   ASSISTANT:  Carrington Clamp, M.D.   ANESTHESIA:  Spinal.   FLUIDS REPLACED:  4000 mL Ringer's lactate.   ESTIMATED BLOOD LOSS:  1200 mL.   URINE OUTPUT:  200 mL.   COMPLICATIONS:  None.   INDICATION FOR PROCEDURE:  The patient is a 45 year old gravida 2, para 1-0-  0-1, Caucasian female with a known twin gestation, status post IVF, who  presents at 51 + 5 weeks with a vertex-oblique transverse lie.  The patient  is a surrogate mother in this pregnancy.  Throughout the pregnancy the twins  had normal concordant growth, and twin A had mild pyelectasis.  There was an  increased risk of an open neural tube defect but ultrasound documented  normal neuroanatomy.  No amniocentesis was performed.  A discussion was held  with the patient and with the adoptive mother regarding the malpresentation  of twin B, and a plan was made to proceed with a primary cesarean section  after risks, benefits, and alternatives were discussed with them.   FINDINGS:  A viable female was delivered at 11:46 a.m.  Apgars were 9 at one  minute and 9 at five minutes.  The weight was 7 pounds 5 ounces, and the  amniotic fluid was noted to be clear.  A viable female was delivered at  11:47 a.m. with Apgars of 8 at one minute and 9 at five minutes.  The weight  was 6 pounds 15 ounces, and the amniotic fluid was also clear.  There was a  nuchal cord x1.  Twin A was delivered in the vertex presentation and twin B  was guided into a vertex presentation as well.   The uterus, tubes, and ovaries were normal.  There was a 1 cm parasitic  fibroid which was attached between the left mesosalpinx and the large  intestine, and this was removed.   SPECIMENS:  The placentas  were sent to pathology with an umbilical clamp on  the placenta of twin A.  The fibroid was sent to pathology separately.   PROCEDURE:  The patient was re-identified in the preoperative hold area.  An  ultrasound was performed to confirm the vertex and transverse oblique lie of  the twins.  The patient was then brought down to the operating room, where a  spinal anesthetic was administered.  The patient was then placed in the  supine position and the abdomen was sterilely prepped and draped.   A Pfannenstiel incision was created sharply with a scalpel and carried down  to the fascia using monopolar cautery for hemostasis.  The fascia was then  incised in the midline with the monopolar cautery and the incision was  extended sharply using a Mayo scissors bilaterally.  The rectus muscles were  dissected off of the overlying fascia superiorly and inferiorly, and the  rectus muscles were sharply divided in the midline.  The parietal peritoneum  was elevated with hemostat clamps and entered sharply.  The incision was  extended cranially and caudally.   The lower uterine segment was exposed and the bladder flap was created  sharply.  A transverse lower uterine segment incision was created with a   scalpel and was extended bluntly.  Membranes were ruptured and a hand was  inserted through the uterine incision to deliver twin A without difficulty.  The nares and mouth were suctioned and the cord was doubly clamped and cut,  and the newborn was carried over to the pediatricians.  The placenta of twin  A was then clamped with an umbilical clamp.   The vertex of twin B was then guided down to the uterine incision and  membranes were ruptured and again clear fluid was appreciated.  The vertex  of twin B was delivered without difficulty and a nuchal cord x1 was reduced  and the remainder of the infant was delivered.  The cord was again doubly  clamped and cut and the newborn was carried over to the awaiting  pediatricians.  The nares and mouth were suctioned.  The cord blood was then  obtained from each of the placentas separately and the placenta was manually  extracted and was sent to pathology.   A moistened lap pad was used to remove any remaining products of conception  from within the uterine cavity.  The uterus was exteriorized to facilitate  its closure.  The lower uterine segment incision was closed with a double  layer closure of #1 chromic.  The first layer was a running locked layer and  the second layer was an imbricating layer.  Hemostasis was noted to be  excellent.   The fibroid was identified attached from the left adnexal region to the  sigmoid colon, and this was sharply excised after two Kelly clamps were  placed on either side.  A free tie was placed around the adipose tissue  which held it in this location.  The specimen was sent to pathology.  Hemostasis was excellent at this site.   The peritoneum along the lower uterine segment incision was made hemostatic  with monopolar cautery and the uterus was then returned to the peritoneal  cavity.   The uterine incision was hemostatic, and the abdomen was therefore closed at this time.  A running suture of 3-0 Vicryl  was used to close the peritoneum.  The rectus muscles were reapproximated in the midline by placing interrupted  sutures of #1 chromic.  The fascia was closed with a running suture of 0  Vicryl.  The subcutaneous tissue  was irrigated and suctioned and made hemostatic with monopolar cautery.  Staples were used to close the skin and a bandage was placed over the  incision.   There were no complications to the procedure.  All needle, instrument, and  sponge counts were correct.                                               Randye Lobo, M.D.    BES/MEDQ  D:  01/01/2004  T:  01/02/2004  Job:  [id number]

## 2011-02-06 NOTE — Discharge Summary (Signed)
NAME:  [facility]                             ACCOUNT NO.:  [id number]   MEDICAL RECORD NO.:  [id number]                   PATIENT TYPE:  INP   LOCATION:  9313                                 FACILITY:  [facility]   PHYSICIAN:  Carrington Clamp, M.D.              DATE OF BIRTH:  1966/01/20   DATE OF ADMISSION:  01/01/2004  DATE OF DISCHARGE:  01/04/2004                                 DISCHARGE SUMMARY   FINAL DIAGNOSES:  1. Twin gestation.  2. Intrauterine pregnancy at 38-5/7 weeks.  3. Vertex and oblique transverse presentation.  4. Status post in vitro fertilization with surrogate pregnancy.  5. Paracytic fibroid.   PROCEDURES:  1. Primary low transverse cesarean section.  2. Removal of paracytic fibroid.   SURGEON:  Randye Lobo, M.D.   ASSISTANT:  Carrington Clamp, M.D.   COMPLICATIONS:  None.   HOSPITAL COURSE:  This 45 year old G2, P1-0-0-1, pregnant at term for a  cesarean section.  The patient has had a known twin gestation which has been  monitored throughout her pregnancy.  She has had normal concordant growth.  The patient's quad screen did show an increased open neural tube defect.  Risk at ultrasound had documented normal neurologic anatomy.  There was no  amniocentesis performed.  The patient was advanced maternal age, though.  The patient's antepartum course has also been complicated by her history of  infertility.  She did go through IVF.  She is a surrogate mother in this  pregnancy as well.  Otherwise, the patient's antepartum course up to this  point had been uncomplicated.  Because of the now presentation of twin B, a  discussion was held with the patient and the adoptive mother and it was felt  to proceed with a cesarean section.   The patient was taken to the operating room on January 01, 2004, by Dr. Edward Jolly  where a primary low transverse cesarean section was performed with delivery  of a 7 pound 5 ounce female infant which was twin A.  He had Apgars of 9  and  9.  Twin B was delivered as well with a female infant weighing 6 pounds 15  ounces, Apgars 8 and 9.  Their deliveries went without complication.  There  was a 1-cm paracytic fibroid which was attached between the left mesosalpinx  and the left intestine and this was removed.  The placentas and the fibroid  were both sent to pathology separately.  The procedure went without  complication.   The patient's postoperative course was benign without significant fever.  She did have some postoperative anemia, for which she was started on iron.  She was felt ready for discharge on postoperative day #3.  She was sent home  on  a regular diet, told to decrease activity, and told to continue her iron as  needed.  There is no pain medication listed in the chart that  she was given.  She was told to follow up in the office in 4 weeks with Dr. Edward Jolly.   Laboratory on discharge:  The patient had a hemoglobin of 8.9, white blood  cell count 9.6.     Leilani Able, P.A.-C.                Carrington Clamp, M.D.    MB/MEDQ  D:  02/04/2004  T:  02/04/2004  Job:  [id number]

## 2011-04-17 ENCOUNTER — Other Ambulatory Visit: Payer: Self-pay | Admitting: Oncology

## 2011-04-17 ENCOUNTER — Encounter (HOSPITAL_BASED_OUTPATIENT_CLINIC_OR_DEPARTMENT_OTHER): Payer: BC Managed Care – PPO | Admitting: Oncology

## 2011-04-17 DIAGNOSIS — C50419 Malignant neoplasm of upper-outer quadrant of unspecified female breast: Secondary | ICD-10-CM

## 2011-04-17 LAB — COMPREHENSIVE METABOLIC PANEL
Albumin: 3.7 g/dL (ref 3.5–5.2)
Alkaline Phosphatase: 35 U/L — ABNORMAL LOW (ref 39–117)
BUN: 14 mg/dL (ref 6–23)
Glucose, Bld: 139 mg/dL — ABNORMAL HIGH (ref 70–99)
Total Bilirubin: 0.6 mg/dL (ref 0.3–1.2)

## 2011-04-17 LAB — CBC WITH DIFFERENTIAL/PLATELET
Basophils Absolute: 0 10*3/uL (ref 0.0–0.1)
EOS%: 3.6 % (ref 0.0–7.0)
HCT: 36 % (ref 34.8–46.6)
HGB: 12.6 g/dL (ref 11.6–15.9)
MCH: 33.1 pg (ref 25.1–34.0)
MCV: 94.9 fL (ref 79.5–101.0)
MONO%: 8.1 % (ref 0.0–14.0)
NEUT%: 57.9 % (ref 38.4–76.8)
Platelets: 164 10*3/uL (ref 145–400)

## 2011-04-24 ENCOUNTER — Encounter: Payer: Self-pay | Admitting: Rheumatology

## 2011-04-24 ENCOUNTER — Encounter: Payer: Self-pay | Admitting: Physician Assistant

## 2011-05-01 ENCOUNTER — Ambulatory Visit (HOSPITAL_COMMUNITY)
Admission: RE | Admit: 2011-05-01 | Discharge: 2011-05-01 | Disposition: A | Discharge status: Home / Self Care | Payer: BC Managed Care – PPO | Source: Ambulatory Visit | Attending: Rheumatology | Admitting: Rheumatology

## 2011-05-01 ENCOUNTER — Other Ambulatory Visit (HOSPITAL_COMMUNITY): Payer: Self-pay | Admitting: Rheumatology

## 2011-05-01 DIAGNOSIS — J45909 Unspecified asthma, uncomplicated: Secondary | ICD-10-CM | POA: Insufficient documentation

## 2011-05-01 DIAGNOSIS — M4120 Other idiopathic scoliosis, site unspecified: Secondary | ICD-10-CM | POA: Insufficient documentation

## 2011-05-01 DIAGNOSIS — D869 Sarcoidosis, unspecified: Secondary | ICD-10-CM

## 2011-06-19 LAB — DIFFERENTIAL
Basophils Absolute: 0
Basophils Relative: 1
Eosinophils Absolute: 0
Monocytes Relative: 6
Neutro Abs: 3.5
Neutrophils Relative %: 62

## 2011-06-19 LAB — COMPREHENSIVE METABOLIC PANEL
ALT: 26
Alkaline Phosphatase: 46
BUN: 9
CO2: 29
Chloride: 100
GFR calc non Af Amer: >60
Glucose, Bld: 93
Potassium: 3.6
Sodium: 135
Total Bilirubin: 1.1
Total Protein: 7

## 2011-06-19 LAB — CBC
HCT: 32.8 — ABNORMAL LOW
Hemoglobin: 10.8 — ABNORMAL LOW
RBC: 4.02
RDW: 16.1 — ABNORMAL HIGH

## 2011-06-19 LAB — CANCER ANTIGEN 27.29: CA 27.29: 33

## 2011-06-19 LAB — POCT HEMOGLOBIN-HEMACUE: Hemoglobin: 10.6 — ABNORMAL LOW

## 2011-06-26 LAB — CBC
MCV: 95 fL (ref 78.0–100.0)
Platelets: 223 10*3/uL (ref 150–400)
RBC: 3.82 MIL/uL — ABNORMAL LOW (ref 3.87–5.11)
WBC: 5.2 10*3/uL (ref 4.0–10.5)

## 2011-06-26 LAB — COMPREHENSIVE METABOLIC PANEL
ALT: 21 U/L (ref 0–35)
AST: 21 U/L (ref 0–37)
Albumin: 3.7 g/dL (ref 3.5–5.2)
CO2: 32 mEq/L (ref 19–32)
Calcium: 9.7 mg/dL (ref 8.4–10.5)
Chloride: 106 mEq/L (ref 96–112)
Creatinine, Ser: 0.66 mg/dL (ref 0.4–1.2)
GFR calc Af Amer: >60 mL/min (ref >60)
Sodium: 143 mEq/L (ref 135–145)
Total Bilirubin: 0.6 mg/dL (ref 0.3–1.2)

## 2011-06-26 LAB — DIFFERENTIAL
Eosinophils Absolute: 0.6 10*3/uL (ref 0.0–0.7)
Eosinophils Relative: 11 % — ABNORMAL HIGH (ref 0–5)
Lymphocytes Relative: 26 % (ref 12–46)
Lymphs Abs: 1.3 10*3/uL (ref 0.7–4.0)
Monocytes Absolute: 0.4 10*3/uL (ref 0.1–1.0)

## 2011-06-26 LAB — URINALYSIS, ROUTINE W REFLEX MICROSCOPIC
Bilirubin Urine: NEGATIVE
Glucose, UA: NEGATIVE mg/dL
Ketones, ur: NEGATIVE mg/dL
Protein, ur: NEGATIVE mg/dL
pH: 7 (ref 5.0–8.0)

## 2011-07-02 ENCOUNTER — Other Ambulatory Visit: Payer: Self-pay | Admitting: Oncology

## 2011-07-02 ENCOUNTER — Encounter (HOSPITAL_BASED_OUTPATIENT_CLINIC_OR_DEPARTMENT_OTHER): Payer: BC Managed Care – PPO | Admitting: Oncology

## 2011-07-02 ENCOUNTER — Ambulatory Visit (HOSPITAL_COMMUNITY)
Admission: RE | Admit: 2011-07-02 | Discharge: 2011-07-02 | Disposition: A | Discharge status: Home / Self Care | Payer: BC Managed Care – PPO | Source: Ambulatory Visit | Attending: Oncology | Admitting: Oncology

## 2011-07-02 DIAGNOSIS — Z853 Personal history of malignant neoplasm of breast: Secondary | ICD-10-CM | POA: Insufficient documentation

## 2011-07-02 DIAGNOSIS — M4120 Other idiopathic scoliosis, site unspecified: Secondary | ICD-10-CM | POA: Insufficient documentation

## 2011-07-02 DIAGNOSIS — Z5111 Encounter for antineoplastic chemotherapy: Secondary | ICD-10-CM

## 2011-07-02 DIAGNOSIS — C801 Malignant (primary) neoplasm, unspecified: Secondary | ICD-10-CM

## 2011-07-02 LAB — CBC WITH DIFFERENTIAL/PLATELET
Basophils Absolute: 0 10*3/uL (ref 0.0–0.1)
EOS%: 1.4 % (ref 0.0–7.0)
HCT: 39.5 % (ref 34.8–46.6)
HGB: 13.9 g/dL (ref 11.6–15.9)
LYMPH%: 18.9 % (ref 14.0–49.7)
MCH: 33.8 pg (ref 25.1–34.0)
MCV: 95.7 fL (ref 79.5–101.0)
MONO%: 6.8 % (ref 0.0–14.0)
NEUT%: 72.7 % (ref 38.4–76.8)
Platelets: 185 10*3/uL (ref 145–400)

## 2011-07-02 LAB — COMPREHENSIVE METABOLIC PANEL
AST: 25 U/L (ref 0–37)
Alkaline Phosphatase: 37 U/L — ABNORMAL LOW (ref 39–117)
BUN: 12 mg/dL (ref 6–23)
Calcium: 10 mg/dL (ref 8.4–10.5)
Chloride: 102 mEq/L (ref 96–112)
Creatinine, Ser: 0.78 mg/dL (ref 0.50–1.10)
Total Bilirubin: 0.6 mg/dL (ref 0.3–1.2)

## 2011-07-02 LAB — LUTEINIZING HORMONE: LH: 37.2 m[IU]/mL

## 2011-07-09 ENCOUNTER — Other Ambulatory Visit: Payer: Self-pay | Admitting: Obstetrics and Gynecology

## 2011-07-09 ENCOUNTER — Encounter (HOSPITAL_BASED_OUTPATIENT_CLINIC_OR_DEPARTMENT_OTHER): Payer: BC Managed Care – PPO | Admitting: Oncology

## 2011-07-09 ENCOUNTER — Other Ambulatory Visit: Payer: Self-pay | Admitting: Oncology

## 2011-07-09 DIAGNOSIS — M069 Rheumatoid arthritis, unspecified: Secondary | ICD-10-CM

## 2011-07-09 DIAGNOSIS — C50419 Malignant neoplasm of upper-outer quadrant of unspecified female breast: Secondary | ICD-10-CM

## 2011-07-09 DIAGNOSIS — Z853 Personal history of malignant neoplasm of breast: Secondary | ICD-10-CM

## 2011-07-11 LAB — ESTRADIOL, ULTRA SENS: Estradiol, Ultra Sensitive: 472 pg/mL

## 2011-07-16 ENCOUNTER — Encounter (INDEPENDENT_AMBULATORY_CARE_PROVIDER_SITE_OTHER): Payer: Self-pay | Admitting: Surgery

## 2011-07-16 DIAGNOSIS — Z853 Personal history of malignant neoplasm of breast: Secondary | ICD-10-CM | POA: Insufficient documentation

## 2011-07-20 NOTE — H&P (Signed)
Rebecca Fleming, Rebecca Fleming                 ACCOUNT NO.:  [id number]  MEDICAL RECORD NO.:  [id number]  LOCATION:  [facility]                         FACILITY:  [facility]  PHYSICIAN:  Randye Lobo, M.D.   DATE OF BIRTH:  09/13/1966  DATE OF ADMISSION:  07/09/2011 DATE OF DISCHARGE:                             HISTORY & PHYSICAL   CHIEF COMPLAINT:  Thickened endometrial lining.  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  The patient's last menstrual period was September 2009, which coincided with her chemotherapy for her breast cancer.  Pelvic ultrasound performed July 01, 2011 documented uterine volume of 328 cubic cm with an endometrial stripe of 19.59 mm.  Tamoxifen cystic effect was noted in the endometrium.  There were no fibroids and the ovaries appeared to be unremarkable.  An endometrial biopsy was performed on July 03, 2011, and this documented benign endocervical tissue with no evidence of endometrial tissue present.  The patient's last Pap smear was performed July 01, 2011 and was within normal limits.  The patient reports symptoms of left lower quadrant sensitivity and bloating.  PAST OBSTETRIC AND GYNECOLOGIC HISTORY: The patient is status post vaginal delivery x1.  She is status post cesarean section of twins, which she carried as a surrogate mother.  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 Colposcopic biopsies at that time were negative, as have been on followup Pap smears.  Her last Pap smear was performed July 01, 2011 and was within normal limits.  PAST MEDICAL HISTORY: 1. Left breast cancer, T1c grade 2 invasive ductal carcinoma, triple     positive.  BRCA1 and 2 negative.  The patient is status post left     lumpectomy and sentinel lymph node dissection.  She is status post     chemotherapy with Taxotere, Cytoxan, and Herceptin.  She began     tamoxifen therapy in January 2010.  She is status post bilateral     mastectomies with implant placement in November 2009. 2. Rheumatoid arthritis. 3. Asthma. 4. Scoliosis. 5. Urinary tract infections.  PAST SURGICAL HISTORY: 1. Status post left breast lumpectomy with sentinel lymph node     dissection. 2. Status post bilateral mastectomies with bilateral reconstructions     with implant placement. 3. Status post rotator cuff surgery in 2007. 4. Status post right knee arthroscopy. 5. Status post cesarean section for twins. 6. Status post appendectomy. 7. Status post rhinoplasty. 8. Status post ganglion cyst removal.  MEDICATIONS:  Tamoxifen, Singulair, Flonase.  ALLERGIES:  LATEX.  SOCIAL HISTORY:  The patient is married.  She works as a Social research officer, government.  She denies the use of tobacco or illicit drugs.  She uses alcohol 1 time per month.  FAMILY HISTORY:  Positive for breast cancer in the patient's mother at age 66.  PHYSICAL EXAMINATION:  VITAL SIGNS:  Height is 5 feet, 9  inches, weight is 152 pounds, blood pressure 100/72. HEENT:  Normocephalic, atraumatic. LUNGS:  Clear to auscultation bilaterally. HEART:  S1, S2 with a regular rate and rhythm. ABDOMEN:  There is a well-healed Pfannenstiel incision, and a right lower quadrant oblique incision, which are both well healed.  The abdomen is soft and nontender without evidence of hepatosplenomegaly or organomegaly. CHEST:  Chest wall and breast exam are significant for bilateral mastectomy with palpable implants consistent with reconstruction.  No evidence of axillary adenopathy is noted. PELVIC:  Normal external genitalia and urethra.  The cervix and vagina are unremarkable.  The uterus palpates to be 9-10  weeks size and is anteverted and mobile.  No adnexal masses nor tenderness are appreciated.  IMPRESSION:  The patient is a 45 year old,  para 38 female with left breast cancer who is currently on tamoxifen therapy and demonstrates both uterine enlargement along with the thickened endometrial stripe. Office biopsy of the endometrium is nondiagnostic.  PLAN:  The patient will undergo a hysteroscopy with dilation and curettage at the [facility] [place] on July 21, 2011. Risks, benefits, alternatives, and surgical goals have been discussed with the patient, who wishes to proceed.     Randye Lobo, M.D.     BES/MEDQ  D:  07/20/2011  T:  07/20/2011  Job:  [id number]

## 2011-07-21 ENCOUNTER — Other Ambulatory Visit: Payer: Self-pay | Admitting: Obstetrics and Gynecology

## 2011-07-21 ENCOUNTER — Encounter (HOSPITAL_COMMUNITY): Payer: Self-pay | Admitting: *Deleted

## 2011-07-21 ENCOUNTER — Ambulatory Visit (HOSPITAL_COMMUNITY): Payer: BC Managed Care – PPO | Admitting: Anesthesiology

## 2011-07-21 ENCOUNTER — Encounter (HOSPITAL_COMMUNITY): Payer: Self-pay | Admitting: Anesthesiology

## 2011-07-21 ENCOUNTER — Encounter (HOSPITAL_COMMUNITY)
Admission: RE | Disposition: A | Discharge status: Home / Self Care | Payer: Self-pay | Source: Ambulatory Visit | Attending: Obstetrics and Gynecology

## 2011-07-21 ENCOUNTER — Ambulatory Visit (HOSPITAL_COMMUNITY): Payer: BC Managed Care – PPO

## 2011-07-21 ENCOUNTER — Ambulatory Visit (HOSPITAL_COMMUNITY)
Admission: RE | Admit: 2011-07-21 | Discharge: 2011-07-21 | Disposition: A | Discharge status: Home / Self Care | Payer: BC Managed Care – PPO | Source: Ambulatory Visit | Attending: Obstetrics and Gynecology | Admitting: Obstetrics and Gynecology

## 2011-07-21 DIAGNOSIS — R9389 Abnormal findings on diagnostic imaging of other specified body structures: Secondary | ICD-10-CM | POA: Insufficient documentation

## 2011-07-21 DIAGNOSIS — C50919 Malignant neoplasm of unspecified site of unspecified female breast: Secondary | ICD-10-CM | POA: Insufficient documentation

## 2011-07-21 HISTORY — DX: Adverse effect of unspecified anesthetic, initial encounter: T41.45XA

## 2011-07-21 HISTORY — DX: Unspecified osteoarthritis, unspecified site: M19.90

## 2011-07-21 HISTORY — PX: HYSTEROSCOPY WITH D & C: SHX1775

## 2011-07-21 HISTORY — DX: Other complications of anesthesia, initial encounter: T88.59XA

## 2011-07-21 LAB — COMPREHENSIVE METABOLIC PANEL
AST: 23 U/L (ref 0–37)
Albumin: 3.7 g/dL (ref 3.5–5.2)
Alkaline Phosphatase: 46 U/L (ref 39–117)
BUN: 11 mg/dL (ref 6–23)
Chloride: 103 mEq/L (ref 96–112)
Potassium: 3.5 mEq/L (ref 3.5–5.1)
Sodium: 138 mEq/L (ref 135–145)
Total Bilirubin: 0.4 mg/dL (ref 0.3–1.2)
Total Protein: 6.7 g/dL (ref 6.0–8.3)

## 2011-07-21 LAB — URINALYSIS, ROUTINE W REFLEX MICROSCOPIC
Bilirubin Urine: NEGATIVE
Glucose, UA: NEGATIVE mg/dL
Ketones, ur: 15 mg/dL — AB
Leukocytes, UA: NEGATIVE
Nitrite: NEGATIVE
Specific Gravity, Urine: >1.03 — ABNORMAL HIGH (ref 1.005–1.030)
pH: 6 (ref 5.0–8.0)

## 2011-07-21 LAB — PREGNANCY, URINE: Preg Test, Ur: NEGATIVE

## 2011-07-21 SURGERY — DILATATION AND CURETTAGE /HYSTEROSCOPY
Anesthesia: General | Site: Vagina | Wound class: Clean Contaminated

## 2011-07-21 MED ORDER — KETOROLAC TROMETHAMINE 30 MG/ML IJ SOLN: 15.0000 mg | Freq: Once | INTRAMUSCULAR | Status: DC | PRN

## 2011-07-21 MED ORDER — ONDANSETRON HCL 4 MG/2ML IJ SOLN: 4.0000 mg | Freq: Once | INTRAMUSCULAR | Status: DC | PRN

## 2011-07-21 MED ORDER — GLYCINE 1.5 % IR SOLN
Status: DC | PRN
  Administered 2011-07-21: 3000 mL

## 2011-07-21 MED ORDER — FLUMAZENIL 0.5 MG/5ML IV SOLN
INTRAVENOUS | Status: DC | PRN
  Administered 2011-07-21: 0.2 mg via INTRAVENOUS

## 2011-07-21 MED ORDER — METOCLOPRAMIDE HCL 10 MG PO TABS
10.0000 mg | ORAL_TABLET | Freq: Once | ORAL | Status: AC
  Administered 2011-07-21: 10 mg via ORAL

## 2011-07-21 MED ORDER — HYDROMORPHONE HCL 1 MG/ML IJ SOLN: 0.2500 mg | INTRAMUSCULAR | Status: DC | PRN

## 2011-07-21 MED ORDER — IBUPROFEN 200 MG PO TABS: 600.0000 mg | ORAL_TABLET | Freq: Four times a day (QID) | ORAL | Status: AC | PRN

## 2011-07-21 MED ORDER — FENTANYL CITRATE 0.05 MG/ML IJ SOLN
INTRAMUSCULAR | Status: DC | PRN
  Administered 2011-07-21 (×2): 50 ug via INTRAVENOUS

## 2011-07-21 MED ORDER — GLYCOPYRROLATE 0.2 MG/ML IJ SOLN
INTRAMUSCULAR | Status: DC | PRN
  Administered 2011-07-21: .4 mg via INTRAVENOUS

## 2011-07-21 MED ORDER — CEFAZOLIN SODIUM 1-5 GM-% IV SOLN
1.0000 g | INTRAVENOUS | Status: AC
  Administered 2011-07-21: 1 g via INTRAVENOUS

## 2011-07-21 MED ORDER — ONDANSETRON HCL 4 MG/2ML IJ SOLN
INTRAMUSCULAR | Status: AC
  Filled 2011-07-21: qty 2

## 2011-07-21 MED ORDER — LIDOCAINE HCL (CARDIAC) 20 MG/ML IV SOLN
INTRAVENOUS | Status: AC
  Filled 2011-07-21: qty 5

## 2011-07-21 MED ORDER — CEFAZOLIN SODIUM 1-5 GM-% IV SOLN
INTRAVENOUS | Status: AC
  Filled 2011-07-21: qty 50

## 2011-07-21 MED ORDER — ONDANSETRON HCL 4 MG/2ML IJ SOLN
INTRAMUSCULAR | Status: DC | PRN
  Administered 2011-07-21: 4 mg via INTRAVENOUS

## 2011-07-21 MED ORDER — MIDAZOLAM HCL 5 MG/5ML IJ SOLN
INTRAMUSCULAR | Status: DC | PRN
  Administered 2011-07-21: 2 mg via INTRAVENOUS

## 2011-07-21 MED ORDER — METOCLOPRAMIDE HCL 10 MG PO TABS
ORAL_TABLET | ORAL | Status: AC
  Administered 2011-07-21: 10 mg via ORAL
  Filled 2011-07-21: qty 1

## 2011-07-21 MED ORDER — KETOROLAC TROMETHAMINE 30 MG/ML IJ SOLN
INTRAMUSCULAR | Status: AC
  Filled 2011-07-21: qty 1

## 2011-07-21 MED ORDER — LACTATED RINGERS IV SOLN
INTRAVENOUS | Status: DC
  Administered 2011-07-21: 14:00:00 via INTRAVENOUS

## 2011-07-21 MED ORDER — PROPOFOL 10 MG/ML IV EMUL
INTRAVENOUS | Status: AC
  Filled 2011-07-21: qty 20

## 2011-07-21 MED ORDER — DEXAMETHASONE SODIUM PHOSPHATE 4 MG/ML IJ SOLN: INTRAMUSCULAR | Status: DC | PRN

## 2011-07-21 MED ORDER — MIDAZOLAM HCL 2 MG/2ML IJ SOLN
INTRAMUSCULAR | Status: AC
  Filled 2011-07-21: qty 2

## 2011-07-21 MED ORDER — GLYCOPYRROLATE 0.2 MG/ML IJ SOLN
INTRAMUSCULAR | Status: AC
  Filled 2011-07-21: qty 1

## 2011-07-21 MED ORDER — MEPERIDINE HCL 25 MG/ML IJ SOLN: 6.2500 mg | INTRAMUSCULAR | Status: DC | PRN

## 2011-07-21 MED ORDER — FENTANYL CITRATE 0.05 MG/ML IJ SOLN
INTRAMUSCULAR | Status: AC
  Filled 2011-07-21: qty 2

## 2011-07-21 MED ORDER — FLUMAZENIL 1 MG/10ML IV SOLN
INTRAVENOUS | Status: AC
  Filled 2011-07-21: qty 10

## 2011-07-21 MED ORDER — PROPOFOL 10 MG/ML IV EMUL
INTRAVENOUS | Status: DC | PRN
  Administered 2011-07-21: 150 mg via INTRAVENOUS

## 2011-07-21 MED ORDER — FAMOTIDINE 20 MG PO TABS
20.0000 mg | ORAL_TABLET | Freq: Once | ORAL | Status: AC
  Administered 2011-07-21: 20 mg via ORAL

## 2011-07-21 MED ORDER — KETOROLAC TROMETHAMINE 30 MG/ML IJ SOLN
INTRAMUSCULAR | Status: DC | PRN
  Administered 2011-07-21: 30 mg via INTRAVENOUS

## 2011-07-21 MED ORDER — LACTATED RINGERS IV SOLN
INTRAVENOUS | Status: DC | PRN
  Administered 2011-07-21 (×2): via INTRAVENOUS

## 2011-07-21 MED ORDER — LIDOCAINE HCL 1 % IJ SOLN
INTRAMUSCULAR | Status: DC | PRN
  Administered 2011-07-21: 10 mL

## 2011-07-21 MED ORDER — FAMOTIDINE 20 MG PO TABS
ORAL_TABLET | ORAL | Status: AC
  Administered 2011-07-21: 20 mg via ORAL
  Filled 2011-07-21: qty 1

## 2011-07-21 SURGICAL SUPPLY — 12 items
CANISTER SUCTION 2500CC (MISCELLANEOUS) ×2 IMPLANT
CATH FOLEY 2WAY SLVR  5CC 16FR (CATHETERS) ×1
CATH FOLEY 2WAY SLVR 5CC 16FR (CATHETERS) IMPLANT
CATH ROBINSON RED A/P 16FR (CATHETERS) ×1 IMPLANT
CLOTH BEACON ORANGE TIMEOUT ST (SAFETY) ×2 IMPLANT
CONTAINER PREFILL 10% NBF 60ML (FORM) ×4 IMPLANT
DRAPE UTILITY XL STRL (DRAPES) ×2 IMPLANT
GLOVE BIO SURGEON STRL SZ 6.5 (GLOVE) ×4 IMPLANT
GOWN PREVENTION PLUS LG XLONG (DISPOSABLE) ×2 IMPLANT
PACK HYSTEROSCOPY LF (CUSTOM PROCEDURE TRAY) ×2 IMPLANT
TOWEL OR 17X24 6PK STRL BLUE (TOWEL DISPOSABLE) ×4 IMPLANT
WATER STERILE IRR 1000ML POUR (IV SOLUTION) ×1 IMPLANT

## 2011-07-21 NOTE — Anesthesia Preprocedure Evaluation (Signed)
Anesthesia Evaluation  Patient identified by MRN, date of birth, ID band Patient awake  General Assessment Comment  Reviewed: Allergy & Precautions, H&P , NPO status , Patient's Chart, lab work & pertinent test results  Airway Mallampati: I TM Distance: >3 FB Neck ROM: full    Dental  (+) Teeth Intact   Pulmonary    Pulmonary exam normal       Cardiovascular     Neuro/Psych Negative Neurological ROS  Negative Psych ROS   GI/Hepatic negative GI ROS Neg liver ROS    Endo/Other  Negative Endocrine ROS  Renal/GU negative Renal ROS  Genitourinary negative   Musculoskeletal negative musculoskeletal ROS (+)   Abdominal Normal abdominal exam  (+)   Peds negative pediatric ROS (+)  Hematology negative hematology ROS (+)   Anesthesia Other Findings   Reproductive/Obstetrics negative OB ROS                           Anesthesia Physical Anesthesia Plan  ASA: I  Anesthesia Plan: General   Post-op Pain Management:    Induction: Intravenous  Airway Management Planned: LMA  Additional Equipment:   Intra-op Plan:   Post-operative Plan:   Informed Consent: I have reviewed the patients History and Physical, chart, labs and discussed the procedure including the risks, benefits and alternatives for the proposed anesthesia with the patient or authorized representative who has indicated his/her understanding and acceptance.   Dental Advisory Given and History available from chart only  Plan Discussed with: CRNA  Anesthesia Plan Comments:         Anesthesia Quick Evaluation

## 2011-07-21 NOTE — Transfer of Care (Signed)
Immediate Anesthesia Transfer of Care Note  Patient: Rebecca Fleming  Procedure(s) Performed:  DILATATION AND CURETTAGE (D&C) /HYSTEROSCOPY  Patient Location: PACU  Anesthesia Type: General  Level of Consciousness: awake  Airway & Oxygen Therapy: Patient Spontanous Breathing and Patient connected to nasal cannula oxygen  Post-op Assessment: Report given to PACU RN and Patient moving all extremities  Post vital signs: Reviewed and stable  Complications: No apparent anesthesia complications

## 2011-07-21 NOTE — Anesthesia Postprocedure Evaluation (Signed)
Anesthesia Post Note  Patient: Rebecca Fleming  Procedure(s) Performed:  DILATATION AND CURETTAGE (D&C) /HYSTEROSCOPY - with Ultrasound Guidance  Anesthesia type: General  Patient location: PACU  Post pain: Pain level controlled  Post assessment: Post-op Vital signs reviewed  Last Vitals:  Filed Vitals:   07/21/11 1620  BP: 102/54  Pulse: 69  Temp: 36.4 C  Resp: 14    Post vital signs: Reviewed  Level of consciousness: sedated  Complications: No apparent anesthesia complicationsfj

## 2011-07-21 NOTE — Transfer of Care (Signed)
Immediate Anesthesia Transfer of Care Note  Patient: Rebecca Fleming  Procedure(s) Performed:  DILATATION AND CURETTAGE (D&C) /HYSTEROSCOPY  Patient Location: PACU  Anesthesia Type: General  Level of Consciousness: awake  Airway & Oxygen Therapy: Patient Spontanous Breathing and Patient connected to nasal cannula oxygen  Post-op Assessment: Report given to PACU RN, Post -op Vital signs reviewed and stable and Patient moving all extremities  Post vital signs: Reviewed and stable  Complications: No apparent anesthesia complications

## 2011-07-21 NOTE — Progress Notes (Signed)
Pre-op Note  Is now on Plaquinil for rhuematoid arthritis.  OK to proceed with surgery.

## 2011-07-21 NOTE — Brief Op Note (Signed)
07/21/2011  4:16 PM  PATIENT:  Rebecca Fleming  45 y.o. female  PRE-OPERATIVE DIAGNOSIS:  THICKENED ENDOMETRIUM HX BREAST CANCER TAMOXIFEN USE  POST-OPERATIVE DIAGNOSIS:  Thickened Endometrium, Hx Breast Cancer, Recently discontinued tamoxifen  PROCEDURE:  Procedure(s): ULTRASOUND GUIDED FRACTIONAL DILATATION AND CURETTAGE (D&C) /HYSTEROSCOPY   SURGEON:  Surgeon(s): Arabela Basaldua A Silva  PHYSICIAN ASSISTANT:   ASSISTANTS: none   ANESTHESIA:   general  EBL:  Total I/O In: 1000 [I.V.:1000] Out: 45 [Urine:20; Blood:25]  BLOOD ADMINISTERED:none  DRAINS: none   LOCAL MEDICATIONS USED:  LIDOCAINE 10 CC  SPECIMEN:  Source of Specimen:  Endometrial and Endocervical Curettings  DISPOSITION OF SPECIMEN:  PATHOLOGY  COUNTS:  YES  TOURNIQUET:  * No tourniquets in log *  DICTATION: .Other Dictation: Dictation Number    PLAN OF CARE: Discharge to home after PACU  PATIENT DISPOSITION:  PACU - hemodynamically stable.   Delay start of Pharmacological VTE agent (>24hrs) due to surgical blood loss or risk of bleeding:  not applicable

## 2011-07-22 ENCOUNTER — Encounter (HOSPITAL_COMMUNITY): Payer: Self-pay | Admitting: Obstetrics and Gynecology

## 2011-07-22 NOTE — Op Note (Signed)
Rebecca Fleming, Rebecca Fleming                 ACCOUNT NO.:  [id number]  MEDICAL RECORD NO.:  [id number]  LOCATION:  [facility]                          FACILITY:  [facility]  PHYSICIAN:  Randye Lobo, M.D.   DATE OF BIRTH:  03/24/1966  DATE OF PROCEDURE:  07/21/2011 DATE OF DISCHARGE:  07/21/2011                              OPERATIVE REPORT   PREOPERATIVE DIAGNOSES: 1. Thickened endometrium. 2. T1c grade 2 invasive ductal carcinoma of the breast, currently on     tamoxifen therapy.  POSTOPERATIVE DIAGNOSES: 1. Thickened endometrium. 2. T1c grade 2 invasive ductal carcinoma of the left breast, currently     off tamoxifen therapy.  PROCEDURES: 1. Examination under anesthesia. 2. Ultrasound guided Fractional Dilation and Curettage. 3. Hysteroscopy.  SURGEON:  Randye Lobo, MD  ANESTHESIA:  General endotracheal, paracervical block with 1% lidocaine.  ESTIMATED BLOOD LOSS:  25 mL.  URINE OUTPUT:  25 mL.  COMPLICATIONS:  None.  GLYCINE DEFICIT:  120 mL.  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  The patient has been amenorrheic since September 2009, which coincided with chemotherapy for her breast cancer.  Pelvic ultrasound on July 01, 2011 documented the uterine volume of 328 cubic cm with an endometrial stripe of 19.59 mm.  Tamoxifen cystic effect was noted within the endometrium.  No fibroids of the uterus were noted and the ovaries appeared to be unremarkable.  In office, endometrial biopsy performed on July 03, 2011 was nondiagnostic and documented benign endocervical tissue with no evidence of endometrium present.  The patient's last Pap smear was July 01, 2011 and was within normal limits.  Plan is now made to proceed with a hysteroscopy and dilation and curettage after risks, benefits, alternatives, surgical objectives have been reviewed.  In the interim between the patient's preoperative visit and her surgical visit today, she has met with her oncologist and she has discontinued tamoxifen use.  Examination under anesthesia revealed a 12-week size anteverted mobile uterus.  No adnexal masses were appreciated.  The uterus was initially sounded to 7 cm, however, with placement of the hysteroscope, it was clear that there was area of adhesions at the top of the cervix, which prevented entry into the uterine cavity.  After ultrasound guidance was obtained, the uterus was sounded to 14 cm. Intraoperative ultrasound demonstrated an endometrial thickness of 2.6 cm, which was reduced down to approximately 1.5 cm at the end of the surgery.  A moderate amount of endometrial curettings were sent to Pathology.  PROCEDURE IN DETAIL:  The patient was reidentified in the preoperative hold area.  She received Ancef 1 g IV for antibiotic prophylaxis.  She received TED hose and PAS stockings for DVT prophylaxis.  In the operating room, general endotracheal anesthesia was induced and the patient was then placed in the dorsal lithotomy position.  The lower abdomen, vagina, and perineum were sterilely prepped and draped.  The bladder was catheterized of urine.  An exam under anesthesia was performed.  A speculum was placed inside the vagina and a single-tooth tenaculum was placed on the anterior cervical lip.  A paracervical block was performed with a total of 10 mL of 1% lidocaine.  The uterus was sounded to 7 cm. The cervix was then dilated to a #21 Pratt dilator.  The diagnostic hysteroscope was inserted under the continuous infusion of glycine. There appeared to be potential adhesions at the top of the surgical field, but this was not clear at  this time.  Ultrasound assistance was requested from the Radiology Department.  The ultrasound technician appeared promptly and performed transabdominal ultrasonography as I placed a Pratt dilator from below.  It was clear at this time that there was a scar tissue at the top of the cervix at the level of the lower uterine segment.  Under ultrasound guidance, I was able to appropriately perforate through the scar tissue into the uterine cavity in order to reach the uterine fundus.  A uterine sound was then used to measure this distance, which was 14 cm.  The cervix was then dilated to a #29 Pratt dilator again under ultrasound guidance.  A combination of sharp and serrated curettes were then used to curette the endometrium under ultrasound guidance.  A moderate amount of tissue was obtained.  This was sent to Pathology.  A curved curette was then used to curette the endocervix and the specimen was sent to Pathology separately from the endometrial curettings.  All of the instruments were removed at this time and the patient was cleansed of Betadine.  She was awakened and extubated, and escorted to the recovery room in stable condition.  There were no complications to the procedure.  All needle, instrument, and sponge counts were correct.    Randye Lobo, M.D.    BES/MEDQ  D:  07/21/2011  T:  07/22/2011  Job:  [id number]

## 2011-08-31 ENCOUNTER — Ambulatory Visit
Admission: RE | Admit: 2011-08-31 | Discharge: 2011-08-31 | Disposition: A | Discharge status: Home / Self Care | Payer: BC Managed Care – PPO | Source: Ambulatory Visit | Attending: Oncology | Admitting: Oncology

## 2011-08-31 DIAGNOSIS — Z853 Personal history of malignant neoplasm of breast: Secondary | ICD-10-CM

## 2011-12-03 ENCOUNTER — Other Ambulatory Visit: Payer: BC Managed Care – PPO | Admitting: Lab

## 2011-12-16 ENCOUNTER — Telehealth: Payer: Self-pay | Admitting: Oncology

## 2011-12-16 NOTE — Telephone Encounter (Signed)
Pt called today to check schedule due to system says she has an appt w/PR. appt was rescheduled to 4/22 @ 9 am w/GM as this is GM's pt. Pt was given given new appts for lb 4/8 @ 9:15 am and GM 4/22 @ 9 am. Pt aware that 3/28 appt w/PR is an error.

## 2011-12-17 ENCOUNTER — Ambulatory Visit: Payer: BC Managed Care – PPO | Admitting: Oncology

## 2011-12-28 ENCOUNTER — Other Ambulatory Visit: Payer: BC Managed Care – PPO | Admitting: Lab

## 2011-12-28 LAB — CBC WITH DIFFERENTIAL/PLATELET
Basophils Absolute: 0 10*3/uL (ref 0.0–0.1)
Eosinophils Absolute: 0.2 10*3/uL (ref 0.0–0.5)
HGB: 13.7 g/dL (ref 11.6–15.9)
LYMPH%: 27.8 % (ref 14.0–49.7)
MCV: 97.1 fL (ref 79.5–101.0)
MONO%: 8.5 % (ref 0.0–14.0)
NEUT#: 2 10*3/uL (ref 1.5–6.5)
Platelets: 190 10*3/uL (ref 145–400)
RBC: 4.11 10*6/uL (ref 3.70–5.45)

## 2011-12-28 LAB — COMPREHENSIVE METABOLIC PANEL
BUN: 9 mg/dL (ref 6–23)
CO2: 30 mEq/L (ref 19–32)
Creatinine, Ser: 0.76 mg/dL (ref 0.50–1.10)
Glucose, Bld: 64 mg/dL — ABNORMAL LOW (ref 70–99)
Total Bilirubin: 0.8 mg/dL (ref 0.3–1.2)

## 2011-12-28 LAB — LUTEINIZING HORMONE: LH: 82.8 m[IU]/mL — ABNORMAL HIGH

## 2011-12-28 LAB — FOLLICLE STIMULATING HORMONE: FSH: 156.6 m[IU]/mL — ABNORMAL HIGH

## 2012-01-11 ENCOUNTER — Ambulatory Visit (HOSPITAL_BASED_OUTPATIENT_CLINIC_OR_DEPARTMENT_OTHER): Payer: BC Managed Care – PPO | Admitting: Oncology

## 2012-01-11 ENCOUNTER — Telehealth: Payer: Self-pay | Admitting: *Deleted

## 2012-01-11 VITALS — BP 101/62 | HR 60 | Temp 97.9°F | Ht 67.5 in | Wt 153.0 lb

## 2012-01-11 DIAGNOSIS — M069 Rheumatoid arthritis, unspecified: Secondary | ICD-10-CM

## 2012-01-11 DIAGNOSIS — C50419 Malignant neoplasm of upper-outer quadrant of unspecified female breast: Secondary | ICD-10-CM

## 2012-01-11 DIAGNOSIS — Z9010 Acquired absence of unspecified breast and nipple: Secondary | ICD-10-CM

## 2012-01-11 DIAGNOSIS — Z853 Personal history of malignant neoplasm of breast: Secondary | ICD-10-CM

## 2012-01-11 DIAGNOSIS — L409 Psoriasis, unspecified: Secondary | ICD-10-CM

## 2012-01-11 NOTE — Progress Notes (Signed)
ID: QUANISHA DREWRY   DOB: Jan 27, 1966  MR#: [id number]  JXB#:[id number]  HISTORY OF PRESENT ILLNESS: Ms. Slinker had routine mammography in October of 2008, which was very hard to read because of density in the breast and fibrocystic change.  So on August 22, 2007, she had bilateral breast MRIs at [facility].  This showed extensive fibrocystic change and adenosis, particularly in the right breast at the 12 o'clock position.  In the left breast, there was a fibroadenoma and slight enhancement, clumping, and nodularity as well.  A 62-month repeat MRI was suggested and this was done at [facility] on March 19, 2008.  This showed a 1.7 cm mildly spiculated mass in the left breast with MR features highly suspicious for malignancy.  There were no other suspicious areas in either breast and the lymph nodes also appeared normal.  Ultrasound of the left breast July 1st was able to locate the mass and biopsy was obtained the same day. 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    With this information, the patient was referred to Dr. Jamey Ripa.  After appropriate discussion, she underwent left lumpectomy and sentinel lymph node biopsy April 23, 2008.  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  INTERVAL HISTORY: Shahed returns today with her husband [name] for follow-up of her breast cancer. Interval history is generally unremarkable. In addition to her "[employer]", she is working for the [employer], [employer]. Family is doing well and her gynecologic history is  updated below  REVIEW OF SYSTEMS: She started letrozole she has had some aches and cramps, but she considers these minimal. There very intermittent. She does hydrate herself well. She has a little bit of a runny nose and some sinus problems, history of heartburn, keeps a dry cough this time of year, has a history of psoriasis and rheumatoid arthritis and this is being treated currently with methotrexate under Dr. Titus Dubin. Hot flashes are mild and really haven't changed. She had a couple of episodes of spotting since switching to letrozole, this is being followed through her gynecologist, Duane Lope. Otherwise a detailed review of systems was noncontributory  PAST MEDICAL HISTORY: Past Medical History  Diagnosis Date  . Arthritis   . Complication of anesthesia     pt states" gets shakes" with general  Significant for scoliosis, rhinoplasty, iron deficiency anemia secondary to menstruation, status post C-section in 2005, history of left shoulder arthroscopy 2007, removal of a cyst from her left foot, removal of a mass from her left breast at age 69, status post LASIX surgery, removal of 2 moles from her skin, both benign.  History of exercise-induced asthma.  Remote history of migraines.  Mild GERD.  Mild problems with hemorrhoids.    PAST SURGICAL HISTORY: Past Surgical History  Procedure Date  . Cesarean section 01/01/2004    Dr Edward Jolly  . Breast lumpectomy w/ needle localization 04/23/2008    w SLN Dr Jamey Ripa  . Breast lumpectomy 05/17/2008    re excise for margin - Dr Jamey Ripa  . Portacath placement 05/17/2008    Dr Jamey Ripa  . Mastectomy  09/11/2008    bilateral - Dr Jamey Ripa  . Insertion of tissue expander after mastectomy 09/11/2008    Dr Odis Luster  . Port-a-cath removal 09/09/2009    Dr Jamey Ripa  . Umbilical hernia repair 09/09/2009    Dr Jamey Ripa  . Hysteroscopy w/d&c 07/21/2011    Procedure: DILATATION AND CURETTAGE (D&C) /HYSTEROSCOPY;  Surgeon: Melony Overly;  Location: [facility];  Service:  Gynecology;  Laterality: N/A;  with Ultrasound Guidance    FAMILY HISTORY The patient's father died from an MI in his late 9's. The patient's mother is alive at age 6.  She was diagnosed with breast cancer at age 82.  She is BRCA 1 and 2 negative.  The patient has two brothers, both in good shape. There is no other history of breast or ovarian cancer in the family.   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  She also carried her own child to term.  Last menstrual period was 2009; she had a D&C due to a thickened endometrial strip 2012; she has had some spotting last 2 months. [place], most recently Jan 2013 under Duane Lope, normal (per pt)  SOCIAL HISTORY: Yi works at the [employer].  Her husband, [name], present today, is a Psychologist, educational.  They have a son, Will, currently 55 who is doing great in school.  The patient is not a church attender.     ADVANCED DIRECTIVES: not in place  HEALTH MAINTENANCE: History  Substance Use Topics  . Smoking status: Never Smoker   . Smokeless tobacco: Not on file  . Alcohol Use: Yes     Colonoscopy: n/a  PAP: UTD  Bone density: 08/31/2011/ normal  Lipid panel:  Allergies  Allergen Reactions  . Latex     stuffiness    Current Outpatient Prescriptions  Medication Sig Dispense Refill  . albuterol (PROVENTIL HFA;VENTOLIN HFA) 108 (90 BASE) MCG/ACT inhaler Inhale 2 puffs into the lungs every 6 (six) hours as needed. Shortness of breath       . b complex vitamins tablet Take 1 tablet by mouth daily.        . Calcium Carbonate-Vitamin D (CALTRATE 600+D PO) Take 1 tablet by mouth daily.        . cetirizine (ZYRTEC) 10 MG tablet Take 10 mg by mouth daily.        . fluticasone (FLONASE) 50 MCG/ACT nasal spray Place 1 spray into the nose daily.        . folic acid (FOLVITE) 1 MG tablet Take 2 mg by mouth daily.      Marland Kitchen GLUCOSAMINE PO Take 1 tablet by mouth daily.        .  hydroxychloroquine (PLAQUENIL) 200 MG tablet Take 200 mg by mouth 2 (two) times daily.        Marland Kitchen letrozole (FEMARA) 2.5 MG tablet Take 2.5 mg by mouth daily.      Marland Kitchen METHOTREXATE SODIUM, PF, IJ Inject 0.8 mLs as directed once a week.      . montelukast (SINGULAIR) 10 MG tablet Take 10 mg by mouth at bedtime.        . Olopatadine HCl (PATADAY OP) Apply 1 drop to eye daily.          OBJECTIVE: Middle-aged white woman who appears fit Filed Vitals:   01/11/12 0911  BP: 101/62  Pulse: 60  Temp: 97.9 F (36.6 C)     Body mass index is 23.61  kg/(m^2).    ECOG FS: 0  Sclerae unicteric Oropharynx clear No peripheral adenopathy Lungs no rales or rhonchi Heart regular rate and rhythm, 2/6 systolic murmur as previously noted Abd benign MSK no focal spinal tenderness, no peripheral edema Neuro: nonfocal Breasts: status post bilateral mastectomies with reconstruction; no evidence of local recurrence  LAB RESULTS: estradiol <2, FSH 156.6,  LH 82.8 Lab Results  Component Value Date   WBC 3.5* 12/28/2011   NEUTROABS 2.0 12/28/2011   HGB 13.7 12/28/2011   HCT 39.9 12/28/2011   MCV 97.1 12/28/2011   PLT 190 12/28/2011      Chemistry      Component Value Date/Time   NA 141 12/28/2011 0934   K 4.2 12/28/2011 0934   CL 103 12/28/2011 0934   CO2 30 12/28/2011 0934   BUN 9 12/28/2011 0934   CREATININE 0.76 12/28/2011 0934      Component Value Date/Time   CALCIUM 10.0 12/28/2011 0934   ALKPHOS 54 12/28/2011 0934   AST 18 12/28/2011 0934   ALT 17 12/28/2011 0934   BILITOT 0.8 12/28/2011 0934       Lab Results  Component Value Date   LABCA2 23 07/02/2011    No components found with this basename: [id number]    No results found for this basename: INR:1;PROTIME:1 in the last 168 hours  Urinalysis    Component Value Date/Time   COLORURINE YELLOW 07/21/2011 1344   APPEARANCEUR CLEAR 07/21/2011 1344   LABSPEC >1.030* 07/21/2011 1344   PHURINE 6.0 07/21/2011 1344   GLUCOSEU NEGATIVE 07/21/2011 1344   HGBUR  NEGATIVE 07/21/2011 1344   BILIRUBINUR NEGATIVE 07/21/2011 1344   KETONESUR 15* 07/21/2011 1344   PROTEINUR NEGATIVE 07/21/2011 1344   UROBILINOGEN 0.2 07/21/2011 1344   NITRITE NEGATIVE 07/21/2011 1344   LEUKOCYTESUR NEGATIVE 07/21/2011 1344    STUDIES:  CHEST - 2 VIEW  07/02/2011 Comparison: 05/01/2011 and earlier.  Findings: Stable scoliosis. Incidental breast implants. Stable  cardiac size and mediastinal contours. Stable large lung volumes.  No pneumothorax, pulmonary edema, pleural effusion, pulmonary  nodule or mass. Stable surgical clip in the left axilla.  IMPRESSION:  No acute or metastatic cardiopulmonary abnormality.  Original Report Authenticated By: Harley Hallmark, M.D.   ASSESSMENT: 46 year old BRCA 1-2 negative [place] woman   (1) status post left lumpectomy and sentinel lymph node dissection August of 2009 for a T1c N0, stage IA invasive ductal carcinoma, grade 2, triple positive with a low proliferation fraction.   (2) received adjuvantTaxotere and Cytoxan x4, then Herceptin to complete a year in October 2010.    (3) s/p bilateral mastectomies with implant placement November 2009   (4)on tamoxifen January 2010 -- January 2013, when she was switched to letrozole.   PLAN: she is tolerating the letrozole well. The plan is to continue that out to total 5 years, which would be 2 years from now. However at that point we may decide to continue an additional 5 years, depending on the current data available then.  I think the spotting she is having is due to her prior endometrial stripe thickening from tamoxifen and it should stop. She is clearly postmenopausal per labs. Her estradiol level is as appropriately suppressed.  The only other issue she is concerned about is her slightly low blood sugar. This has not been accompanied by symptoms. Nonetheless we will obtain a fasting sugar with a concurrent insulin level next week. I will call her with those results, which I  expect to be normal.  Otherwise she will see me again in one year.   Nasiya Pascual C    01/11/2012

## 2012-01-11 NOTE — Telephone Encounter (Signed)
gave patient appointment for 2014 printed out calendar and gave to the patient gave the patient appointment for 01-20-2012 for lab only gave patient instruction on getting the chest x-ray done

## 2012-01-20 ENCOUNTER — Other Ambulatory Visit (HOSPITAL_BASED_OUTPATIENT_CLINIC_OR_DEPARTMENT_OTHER): Payer: BC Managed Care – PPO | Admitting: Lab

## 2012-01-20 DIAGNOSIS — Z853 Personal history of malignant neoplasm of breast: Secondary | ICD-10-CM

## 2012-01-20 DIAGNOSIS — C50419 Malignant neoplasm of upper-outer quadrant of unspecified female breast: Secondary | ICD-10-CM

## 2012-01-20 LAB — BASIC METABOLIC PANEL
CO2: 29 mEq/L (ref 19–32)
Chloride: 106 mEq/L (ref 96–112)
Creatinine, Ser: 0.73 mg/dL (ref 0.50–1.10)
Glucose, Bld: 82 mg/dL (ref 70–99)

## 2012-07-10 ENCOUNTER — Other Ambulatory Visit: Payer: Self-pay | Admitting: Oncology

## 2012-09-27 ENCOUNTER — Other Ambulatory Visit: Payer: Self-pay | Admitting: Oncology

## 2012-10-04 ENCOUNTER — Other Ambulatory Visit: Payer: Self-pay | Admitting: *Deleted

## 2012-10-04 DIAGNOSIS — Z853 Personal history of malignant neoplasm of breast: Secondary | ICD-10-CM

## 2012-10-06 ENCOUNTER — Ambulatory Visit (HOSPITAL_COMMUNITY)
Admission: RE | Admit: 2012-10-06 | Discharge: 2012-10-06 | Disposition: A | Discharge status: Home / Self Care | Payer: BC Managed Care – PPO | Source: Ambulatory Visit | Attending: Oncology | Admitting: Oncology

## 2012-10-06 ENCOUNTER — Encounter (HOSPITAL_COMMUNITY): Payer: Self-pay

## 2012-10-06 DIAGNOSIS — Z853 Personal history of malignant neoplasm of breast: Secondary | ICD-10-CM | POA: Insufficient documentation

## 2012-10-06 DIAGNOSIS — R911 Solitary pulmonary nodule: Secondary | ICD-10-CM | POA: Insufficient documentation

## 2012-10-06 MED ORDER — IOHEXOL 300 MG/ML  SOLN
80.0000 mL | Freq: Once | INTRAMUSCULAR | Status: AC | PRN
  Administered 2012-10-06: 80 mL via INTRAVENOUS

## 2012-12-26 ENCOUNTER — Ambulatory Visit (HOSPITAL_COMMUNITY)
Admission: RE | Admit: 2012-12-26 | Discharge: 2012-12-26 | Disposition: A | Discharge status: Home / Self Care | Payer: BC Managed Care – PPO | Source: Ambulatory Visit | Attending: Oncology | Admitting: Oncology

## 2012-12-26 ENCOUNTER — Other Ambulatory Visit (HOSPITAL_BASED_OUTPATIENT_CLINIC_OR_DEPARTMENT_OTHER): Payer: BC Managed Care – PPO

## 2012-12-26 DIAGNOSIS — M4180 Other forms of scoliosis, site unspecified: Secondary | ICD-10-CM | POA: Insufficient documentation

## 2012-12-26 DIAGNOSIS — K449 Diaphragmatic hernia without obstruction or gangrene: Secondary | ICD-10-CM | POA: Insufficient documentation

## 2012-12-26 DIAGNOSIS — I517 Cardiomegaly: Secondary | ICD-10-CM | POA: Insufficient documentation

## 2012-12-26 DIAGNOSIS — C50419 Malignant neoplasm of upper-outer quadrant of unspecified female breast: Secondary | ICD-10-CM

## 2012-12-26 DIAGNOSIS — Z853 Personal history of malignant neoplasm of breast: Secondary | ICD-10-CM | POA: Insufficient documentation

## 2012-12-26 LAB — COMPREHENSIVE METABOLIC PANEL (CC13)
ALT: 20 U/L (ref 0–55)
AST: 22 U/L (ref 5–34)
Albumin: 3.8 g/dL (ref 3.5–5.0)
Alkaline Phosphatase: 63 U/L (ref 40–150)
BUN: 9.3 mg/dL (ref 7.0–26.0)
Creatinine: 0.8 mg/dL (ref 0.6–1.1)
Glucose: 89 mg/dl (ref 70–99)
Sodium: 139 mEq/L (ref 136–145)
Total Bilirubin: 0.64 mg/dL (ref 0.20–1.20)
Total Protein: 6.7 g/dL (ref 6.4–8.3)

## 2012-12-26 LAB — CBC WITH DIFFERENTIAL/PLATELET
Eosinophils Absolute: 0.1 10*3/uL (ref 0.0–0.5)
MONO#: 0.5 10*3/uL (ref 0.1–0.9)
NEUT#: 4.1 10*3/uL (ref 1.5–6.5)
Platelets: 200 10*3/uL (ref 145–400)
RBC: 4.15 10*6/uL (ref 3.70–5.45)
RDW: 13.3 % (ref 11.2–14.5)
WBC: 5.9 10*3/uL (ref 3.9–10.3)
lymph#: 1.2 10*3/uL (ref 0.9–3.3)
nRBC: 0 % (ref 0–0)

## 2012-12-26 LAB — LUTEINIZING HORMONE: LH: 72 m[IU]/mL

## 2012-12-26 LAB — FOLLICLE STIMULATING HORMONE: FSH: 144.5 m[IU]/mL — ABNORMAL HIGH

## 2013-01-03 ENCOUNTER — Other Ambulatory Visit: Payer: BC Managed Care – PPO | Admitting: Lab

## 2013-01-09 LAB — ESTRADIOL, ULTRA SENS: Estradiol, Ultra Sensitive: 19

## 2013-01-10 ENCOUNTER — Ambulatory Visit (HOSPITAL_BASED_OUTPATIENT_CLINIC_OR_DEPARTMENT_OTHER): Payer: BC Managed Care – PPO | Admitting: Oncology

## 2013-01-10 ENCOUNTER — Telehealth: Payer: Self-pay | Admitting: *Deleted

## 2013-01-10 VITALS — BP 117/68 | HR 69 | Temp 98.0°F | Resp 20 | Ht 67.5 in | Wt 158.2 lb

## 2013-01-10 DIAGNOSIS — Z853 Personal history of malignant neoplasm of breast: Secondary | ICD-10-CM

## 2013-01-10 DIAGNOSIS — C50419 Malignant neoplasm of upper-outer quadrant of unspecified female breast: Secondary | ICD-10-CM

## 2013-01-10 DIAGNOSIS — R918 Other nonspecific abnormal finding of lung field: Secondary | ICD-10-CM

## 2013-01-10 DIAGNOSIS — IMO0002 Reserved for concepts with insufficient information to code with codable children: Secondary | ICD-10-CM

## 2013-01-10 MED ORDER — LETROZOLE 2.5 MG PO TABS: 2.5000 mg | ORAL_TABLET | Freq: Every day | ORAL | Status: DC

## 2013-01-10 NOTE — Progress Notes (Signed)
ID: Rebecca Fleming   DOB: 1966-06-02  MR#: [id number]  CSN#:[id number]  PCP: No primary provider on file. GYN: Duane Lope SU: Cicero Duck OTHER MD: Norlene Campbell, Etter Sjogren, Chipper Herb  HISTORY OF PRESENT ILLNESS: Rebecca Fleming had routine mammography in October of 2008, which was very hard to read because of density in the breast and fibrocystic change.  So on August 22, 2007, she had bilateral breast MRIs at [facility].  This showed extensive fibrocystic change and adenosis, particularly in the right breast at the 12 o'clock position.  In the left breast, there was a fibroadenoma and slight enhancement, clumping, and nodularity as well.  A 61-month repeat MRI was suggested and this was done at [facility] on March 19, 2008.  This showed a 1.7 cm mildly spiculated mass in the left breast with MR features highly suspicious for malignancy.  There were no other suspicious areas in either breast and the lymph nodes also appeared normal.  Ultrasound of the left breast July 1st was able to locate the mass and biopsy was obtained the same day. This showed ([id number] and [id number]) an invasive ductal carcinoma, which was 98% positive for the estrogen receptor, 99% positive for the progesterone receptor, with a low mib-1 at 16% and Hercept test equivocal at 2+, with FISH showing amplification with a ratio of 3.88.    With this information, the patient was referred to Dr. Jamey Ripa.  After appropriate discussion, she underwent left lumpectomy and sentinel lymph node biopsy April 23, 2008.  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  INTERVAL HISTORY: Rebecca Fleming returns today for follow-up of her breast cancer. Interval history is significant for her having had a recurring urinary  tract infection in December 2013, leading to a CT of the abdomen and pelvis which incidentally showed a 3 mm lung lesion. We then obtained a CT of the chest which showed additional very small nonspecific lung lesions. We discussed proper followup of that today.  REVIEW OF SYSTEMS: She just returned from a cruise to the [place] and [employer]. She continues to do her [employer]. She describes herself is moderately fatigued. She's had significant back pain which was evaluated by Dr. Norlene Campbell with an MRI of the lower spine showing no evidence of cancer, but significant degenerative disease. Her rheumatoid arthritis is moderately well-controlled. She has a mild runny nose, rare hot flashes, and no significant problems with vaginal dryness. A detailed review of systems today was otherwise stable.  PAST MEDICAL HISTORY: Past Medical History  Diagnosis Date  . Arthritis   . Complication of anesthesia     pt states" gets shakes" with general  Significant for scoliosis, rhinoplasty, iron deficiency anemia secondary to menstruation, status post C-section in 2005, history of left shoulder arthroscopy 2007, removal of a cyst from her left foot, removal of a mass from her left breast at age 27, status post LASIX surgery, removal of 2 moles from her skin, both benign.  History of exercise-induced asthma.  Remote history of migraines.  Mild GERD.  Mild problems with hemorrhoids.    PAST SURGICAL HISTORY: Past Surgical History  Procedure Laterality Date  . Cesarean section  01/01/2004    Dr Edward Jolly  . Breast lumpectomy w/ needle localization  04/23/2008    w SLN Dr Jamey Ripa  .  Breast lumpectomy  05/17/2008    re excise for margin - Dr Jamey Ripa  . Portacath placement  05/17/2008    Dr Jamey Ripa  . Mastectomy  09/11/2008    bilateral - Dr Jamey Ripa  . Insertion of tissue expander after mastectomy  09/11/2008    Dr Odis Luster  . Port-a-cath removal  09/09/2009    Dr Jamey Ripa  . Umbilical hernia repair   09/09/2009    Dr Jamey Ripa  . Hysteroscopy w/d&c  07/21/2011    Procedure: DILATATION AND CURETTAGE (D&C) /HYSTEROSCOPY;  Surgeon: Melony Overly;  Location: [facility];  Service: Gynecology;  Laterality: N/A;  with Ultrasound Guidance    FAMILY HISTORY The patient's father died from an MI in his late 54's. The patient's mother is alive at age 67.  She was diagnosed with breast cancer at age 57.  She is BRCA 1 and 2 negative.  The patient has two brothers, both in good shape. There is no other history of breast or ovarian cancer in the family.   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  She also carried her own child to term.  Last menstrual period was 2009; she had a D&C due to a thickened endometrial strip 2012; she has had some spotting last 2 months. [place], most recently Jan 2013 under Duane Lope, normal (per pt)  SOCIAL HISTORY: Angell works at the [employer].  Her husband, [name], present today, is a Psychologist, educational.  They have a son, Rebecca Fleming, currently 64 who is doing great in school.  The patient is not a church attender.     ADVANCED DIRECTIVES: not in place  HEALTH MAINTENANCE: History  Substance Use Topics  . Smoking status: Never Smoker   . Smokeless tobacco: Not on file  . Alcohol Use: Yes     Colonoscopy: n/a  PAP: UTD  Bone density: 08/31/2011/ normal  Lipid panel:  Allergies  Allergen Reactions  . Latex     stuffiness    Current Outpatient Prescriptions  Medication Sig Dispense Refill  . albuterol (PROVENTIL HFA;VENTOLIN HFA) 108 (90 BASE) MCG/ACT inhaler Inhale 2 puffs into the lungs every 6 (six) hours as needed. Shortness of breath       . b complex vitamins tablet Take 1 tablet by mouth daily.        . Calcium Carbonate-Vitamin D (CALTRATE 600+D PO) Take 1 tablet by mouth daily.        . cetirizine (ZYRTEC) 10 MG tablet Take 10 mg by mouth daily.        . fluticasone (FLONASE) 50 MCG/ACT  nasal spray Place 1 spray into the nose daily.        . folic acid (FOLVITE) 1 MG tablet Take 2 mg by mouth daily.      Marland Kitchen GLUCOSAMINE PO Take 1 tablet by mouth daily.        . hydroxychloroquine (PLAQUENIL) 200 MG tablet Take 200 mg by mouth 2 (two) times daily.        Marland Kitchen letrozole (FEMARA) 2.5 MG tablet TAKE ONE TABLET BY MOUTH ONE TIME DAILY  30 tablet  PRN  . METHOTREXATE SODIUM, PF, IJ Inject 0.8 mLs as directed once a week.      . montelukast (SINGULAIR) 10 MG tablet Take 10 mg by mouth at bedtime.        . Olopatadine HCl (PATADAY OP) Apply 1 drop to eye daily.  No current facility-administered medications for this visit.    OBJECTIVE: Middle-aged white woman who appears well Filed Vitals:   01/10/13 0940  BP: 117/68  Pulse: 69  Temp: 98 F (36.7 C)  Resp: 20     Body mass index is 24.4 kg/(m^2).    ECOG FS: 0  Sclerae unicteric Oropharynx clear No peripheral adenopathy Lungs no rales or rhonchi, good excursion bilaterally Heart regular rate and rhythm, no murmurs are noted today  Abd benign MSK scoliosis but no focal spinal tenderness, no peripheral edema Neuro: nonfocal, well oriented, appropriate affect Breasts: status post bilateral mastectomies with silicone reconstruction; no evidence of local recurrence; both axillae are benign  LAB RESULTS: estradiol <2, FSH 156.6,  LH 82.8 Lab Results  Component Value Date   WBC 5.9 12/26/2012   NEUTROABS 4.1 12/26/2012   HGB 13.6 12/26/2012   HCT 39.2 12/26/2012   MCV 94.5 12/26/2012   PLT 200 12/26/2012      Chemistry      Component Value Date/Time   NA 139 12/26/2012 1213   NA 143 01/20/2012 0856   K 4.3 12/26/2012 1213   K 4.5 01/20/2012 0856   CL 104 12/26/2012 1213   CL 106 01/20/2012 0856   CO2 25 12/26/2012 1213   CO2 29 01/20/2012 0856   BUN 9.3 12/26/2012 1213   BUN 10 01/20/2012 0856   CREATININE 0.8 12/26/2012 1213   CREATININE 0.73 01/20/2012 0856      Component Value Date/Time   CALCIUM 9.8 12/26/2012 1213   CALCIUM 9.6  01/20/2012 0856   ALKPHOS 63 12/26/2012 1213   ALKPHOS 54 12/28/2011 0934   AST 22 12/26/2012 1213   AST 18 12/28/2011 0934   ALT 20 12/26/2012 1213   ALT 17 12/28/2011 0934   BILITOT 0.64 12/26/2012 1213   BILITOT 0.8 12/28/2011 0934       Lab Results  Component Value Date   LABCA2 23 07/02/2011    No components found with this basename: [id number]    No results found for this basename: INR,  in the last 168 hours  Urinalysis    Component Value Date/Time   COLORURINE YELLOW 07/21/2011 1344   APPEARANCEUR CLEAR 07/21/2011 1344   LABSPEC >1.030* 07/21/2011 1344   PHURINE 6.0 07/21/2011 1344   GLUCOSEU NEGATIVE 07/21/2011 1344   HGBUR NEGATIVE 07/21/2011 1344   BILIRUBINUR NEGATIVE 07/21/2011 1344   KETONESUR 15* 07/21/2011 1344   PROTEINUR NEGATIVE 07/21/2011 1344   UROBILINOGEN 0.2 07/21/2011 1344   NITRITE NEGATIVE 07/21/2011 1344   LEUKOCYTESUR NEGATIVE 07/21/2011 1344    STUDIES:  Dg Chest 2 View  12/26/2012  *RADIOLOGY REPORT*  Clinical Data: History breast carcinoma, follow-up  CHEST - 2 VIEW  Comparison: CT chest of 10/06/2012 and chest x-ray of 07/02/2011  Findings: No active infiltrate or lung metastasis is seen. Mediastinal contours appear stable.  The heart is mildly enlarged. A retrocardiac opacity medially at the left lung base represents a hiatal hernia when compared to the prior CT.   Moderate thoracolumbar scoliosis is noted.  IMPRESSION:  1.  No active lung disease. 2.  Hiatal hernia. 3.  Moderate thoracolumbar scoliosis.   Original Report Authenticated By: Dwyane Dee, M.D.      ASSESSMENT: 47 y.o.  BRCA 1-2 negative [place] woman   (1) status post left lumpectomy and sentinel lymph node dissection August of 2009 for a T1c N0, stage IA invasive ductal carcinoma, grade 2, triple positive with a low proliferation fraction.   (2)  received adjuvantTaxotere and Cytoxan x4, then Herceptin to complete a year in October 2010.    (3) s/p bilateral mastectomies with implant  placement November 2009   (4) on tamoxifen January 2010 -- January 2013, when she was switched to letrozole. Normal dexa scan 08/31/2011  (5) scoliosis and chronic low back pain, evaluated by MRI showing DDD  (6) multiple nonspecific pulmonary nodules noted January 2014 on CT of the chest  PLAN: We discussed the fact that the lung findings are very nonspecific and almost certainly benign, particularly given her history of rheumatoid arthritis. Nevertheless we cannot ignore it. And negative chest x-ray essentially tells [place] first nothing larger than three quarters of a centimeter in her lungs, so it is reassuring from that point of view. We could repeat his CT scan every 6 months, but although it really would do is expose her to more radiation.  Accordingly we decided we would repeat a CT of the chest one more time, January of 2015, which Rebecca Fleming be a year after the original scan. Assuming there has been no significant change we Rebecca Fleming stop further evaluation of those nonspecific lesions. She is a due for a bone density in about the same time and we have also entered that order. Otherwise the plan is to continue letrozole and she knows to call for any problems that may develop before the next visit here, which Rebecca Fleming be January of 2015.   MAGRINAT,GUSTAV C    01/10/2013

## 2013-01-10 NOTE — Telephone Encounter (Signed)
appts made and printed for a bone density, lab and ov for 09/2013.the patient is aware that cs will call and gv her appt d/t for CT scans.Marland KitchenMarland Kitchen

## 2013-06-30 ENCOUNTER — Telehealth: Payer: Self-pay | Admitting: Obstetrics and Gynecology

## 2013-06-30 ENCOUNTER — Encounter: Payer: Self-pay | Admitting: Obstetrics and Gynecology

## 2013-06-30 ENCOUNTER — Ambulatory Visit (INDEPENDENT_AMBULATORY_CARE_PROVIDER_SITE_OTHER): Payer: BC Managed Care – PPO | Admitting: Obstetrics and Gynecology

## 2013-06-30 VITALS — BP 104/60 | HR 60 | Ht 65.75 in | Wt 160.5 lb

## 2013-06-30 DIAGNOSIS — N950 Postmenopausal bleeding: Secondary | ICD-10-CM

## 2013-06-30 NOTE — Progress Notes (Signed)
Patient ID: Rebecca Fleming, female   DOB: Apr 25, 1966, 47 y.o.   MRN: [id number] GYNECOLOGY PROBLEM VISIT  PCP: None  Referring provider:   HPI: 47 y.o.   Married  Caucasian  female   No obstetric history on file. with Patient's last menstrual period was 06/20/2008.   here for  Postmenopausal bleeding.  Menses stopped when patient did chemotherapy. Triple positive breast cancer. Status post bilateral mastectomy. Took tamoxifen until January 2013 for 3 years.  Started Letrozol when had testing which indicated patient in menopause. Has been on this for two years.   Spotting in May 2014.   Two days ago noted dark brown blood and has now become red. Pad change every four hours.   Lower back pain.   Lower abdominal tenderness.   History of hysteroscopic polypectomy with dilation and curettage in October 2012 - benign polyp.  Due to see oncology in January 2015.  Had a dilation and curettage in October 2012 for thickened endometrium 2 years.   GYNECOLOGIC HISTORY: Patient's last menstrual period was 06/20/2008. Sexually active:  yes Partner preference: female Contraception:  postmenopausal  Menopausal hormone therapy: no DES exposure:   no Blood transfusions:   Yes, 10 years ago following surgery. Sexually transmitted diseases:   no GYN Procedures:  D & C Mammogram:   2009 when diagnosed with breast cancer and had bilateral mastectomy.              Pap:   06/2012 wnl History of abnormal pap smear:  no   OB History   Grav Para Term Preterm Abortions TAB SAB Ect Mult Living                     Family History  Problem Relation Age of Onset  . Breast cancer Mother   . Diabetes Mother   . Hypertension Mother   . Stroke Mother   . Heart failure Father   . Hypertension Father     Patient Active Problem List   Diagnosis Date Noted  . Rheumatoid arthritis 01/11/2012  . Psoriasis 01/11/2012  . Hx Breast cancer, IDC, left, Stage I, triple + 07/16/2011    Class: Stage 1     Past Medical History  Diagnosis Date  . Arthritis   . Complication of anesthesia     pt states" gets shakes" with general  . RA (rheumatoid arthritis)   . Anemia   . Blood transfusion without reported diagnosis     greater than 10 yrs ago following surgery  . Cancer 2009    left breast--has bilateral mastectomy  . Fibroid   . Heart murmur   . History of breast cancer   . Scoliosis     Past Surgical History  Procedure Laterality Date  . Cesarean section  01/01/2004    Dr Edward Jolly  . Breast lumpectomy w/ needle localization  04/23/2008    w [facility] Dr Jamey Ripa  . Breast lumpectomy  05/17/2008    re excise for margin - Dr Jamey Ripa  . Portacath placement  05/17/2008    Dr Jamey Ripa  . Mastectomy  09/11/2008    bilateral - Dr Jamey Ripa  . Insertion of tissue expander after mastectomy  09/11/2008    Dr Odis Luster  . Port-a-cath removal  09/09/2009    Dr Jamey Ripa  . Umbilical hernia repair  09/09/2009    Dr Jamey Ripa  . Hysteroscopy w/d&c  07/21/2011    Procedure: DILATATION AND CURETTAGE (D&C) /HYSTEROSCOPY;  Surgeon: Melony Overly;  Location: [facility];  Service: Gynecology;  Laterality: N/A;  with Ultrasound Guidance  . Knee arthroscopy w/ meniscal repair Right     --Dr. Priscille Kluver  . Appendectomy      ALLERGIES: Latex  Current Outpatient Prescriptions  Medication Sig Dispense Refill  . albuterol (PROVENTIL HFA;VENTOLIN HFA) 108 (90 BASE) MCG/ACT inhaler Inhale 2 puffs into the lungs every 6 (six) hours as needed. Shortness of breath       . calcium carbonate (OS-CAL) 600 MG TABS tablet Take 600 mg by mouth 2 (two) times daily with a meal.      . cetirizine (ZYRTEC) 10 MG tablet Take 10 mg by mouth daily.        . fluticasone (FLONASE) 50 MCG/ACT nasal spray Place 1 spray into the nose daily.        . folic acid (FOLVITE) 1 MG tablet Take 2 mg by mouth daily.      Marland Kitchen GLUCOSAMINE PO Take 1 tablet by mouth daily.        . hydroxychloroquine (PLAQUENIL) 200 MG tablet Take 200 mg by mouth 2 (two) times  daily.        Marland Kitchen letrozole (FEMARA) 2.5 MG tablet Take 1 tablet (2.5 mg total) by mouth daily.  30 tablet  PRN  . Magnesium 500 MG TABS Take 1 tablet by mouth 2 (two) times daily.      . Manganese 50 MG TABS Take 1 tablet by mouth daily.      . methocarbamol (ROBAXIN) 500 MG tablet Take 500 mg by mouth as needed.      . METHOTREXATE SODIUM, PF, IJ Inject 0.8 mLs as directed once a week.      . montelukast (SINGULAIR) 10 MG tablet Take 10 mg by mouth at bedtime.        . Olopatadine HCl (PATADAY OP) Apply 1 drop to eye daily.         No current facility-administered medications for this visit.     ROS:  Pertinent items are noted in HPI.  SOCIAL HISTORY:  Married.  Aerobics Secondary school teacher.  PHYSICAL EXAMINATION:    BP 104/60  Pulse 60  Ht 5' 5.75" (1.67 m)  Wt 160 lb 8 oz (72.802 kg)  BMI 26.1 kg/m2  LMP 06/20/2008   Wt Readings from Last 3 Encounters:  06/30/13 160 lb 8 oz (72.802 kg)  01/10/13 158 lb 3.2 oz (71.759 kg)  01/11/12 153 lb (69.4 kg)     Ht Readings from Last 3 Encounters:  06/30/13 5' 5.75" (1.67 m)  01/10/13 5' 7.5" (1.715 m)  01/11/12 5' 7.5" (1.715 m)    General appearance: alert, cooperative and appears stated age Head: Normocephalic, without obvious abnormality, atraumatic Neck: no adenopathy, supple, symmetrical, trachea midline and thyroid not enlarged, symmetric, no tenderness/mass/nodules Lungs: clear to auscultation bilaterally Breasts: Inspection negative, No nipple retraction or dimpling, No nipple discharge or bleeding, No axillary or supraclavicular adenopathy, Normal to palpation without dominant masses Heart: regular rate and rhythm Abdomen: soft, non-tender; no masses,  no organomegaly Extremities: extremities normal, atraumatic, no cyanosis or edema Skin: Skin color, texture, turgor normal. No rashes or lesions Lymph nodes: Cervical, supraclavicular, and axillary nodes normal. No abnormal inguinal nodes palpated Neurologic: Grossly  normal  Pelvic: External genitalia:  no lesions              Urethra:  normal appearing urethra with no masses, tenderness or lesions  Bartholins and Skenes: normal                 Vagina: normal appearing vagina with normal color and discharge, no lesions              Cervix: normal appearance, red blood noted, no active bleeding.                     Bimanual Exam:  Uterus:  uterus is normal size, shape, consistency and nontender                                      Adnexa: normal adnexa in size, nontender and no masses   Procedure - Endometrial Biopsy  Consent for procedure.  Speculum placed.  Sterile prep of cervix.  Tenaculum placed on anterior cervical lip.  Pipelle passed to 6 cm.  Very little tissue obtained.  Paracervical block then performed with 10 cc of 15 lidocaine to the 5 and 7 o'clock positions.  Prat dilator used.  Pipelle passed again to same depth and very little tissue obtained and sent to pathology.  No complications.  Minimal EBL.                               ASSESSMENT  Postmenopausal bleeding episode. History of breast cancer.  Triple positive breast cancer. On Femara. History of endometrial polyp.   PLAN  Follow up of EMB. Check FSH, LH, estradiol. I recommend returning for a pelvic ultrasound and sonohysterogram.  Has return appointment for the end of October for annual exam.  We may need to change this appointment.    An After Visit Summary was printed and given to the patient.

## 2013-06-30 NOTE — Telephone Encounter (Signed)
Chief Complaint  Patient presents with  . Advice Only    Pt is a old pt of Dr.Silva and is a breast cancer survior and has been in menopause for 5 years. She has started bleeding heavy Ngyn appt is 10/31  '

## 2013-06-30 NOTE — Patient Instructions (Signed)
Endometrial Biopsy This is a test in which a tissue sample (a biopsy) is taken from inside the uterus (womb). It is then looked at by a specialist under a microscope to see if the tissue is normal or abnormal. The endometrium is the lining of the uterus. This test helps determine where you are in your menstrual cycle and how hormone levels are affecting the lining of the uterus. Another use for this test is to diagnose endometrial cancer, tuberculosis, polyps, or inflammatory conditions and to evaluate uterine bleeding. PREPARATION FOR TEST No preparation or fasting is necessary. NORMAL FINDINGS No pathologic conditions. Presence of "secretory-type" endometrium 3 to 5 days before to normal menstruation. Ranges for normal findings may vary among different laboratories and hospitals. You should always check with your doctor after having lab work or other tests done to discuss the meaning of your test results and whether your values are considered within normal limits. MEANING OF TEST  Your caregiver will go over the test results with you and discuss the importance and meaning of your results, as well as treatment options and the need for additional tests if necessary. OBTAINING THE TEST RESULTS It is your responsibility to obtain your test results. Ask the lab or department performing the test when and how you will get your results. Document Released: 01/08/2005 Document Revised: 11/30/2011 Document Reviewed: 08/17/2008 [employer] Patient Information 2014 [employer].  

## 2013-06-30 NOTE — Telephone Encounter (Addendum)
Spoke with patient. She has not had a period in 5 years. No chance of pregnancy per patient.  Soaking pads q 3 hours with bright red blood.  Spoke with Dr. Edward Jolly, appointment for today scheduled and patient will come for check in at 1445.

## 2013-07-03 NOTE — Addendum Note (Signed)
Addended by: Conley Simmonds on: 07/03/2013 03:02 PM   Modules accepted: Orders

## 2013-07-05 ENCOUNTER — Telehealth: Payer: Self-pay | Admitting: Obstetrics and Gynecology

## 2013-07-05 LAB — IPS CERVICAL/ECC/EMB/VULVAR/VAGINAL BIOPSY

## 2013-07-05 NOTE — Telephone Encounter (Signed)
Phone call to discuss results.  Patient states that she is still bleeding but less so compared to last week.  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  Labs indicate probable menopause, although the levels have changed from last FSH, LH, and estradiol check.  The estradiol I used was not the ultra sensitive estradiol.  Endometrial biopsy showed benign endocervical cells and no definitive endometrial tissue.  Patient scheduled for next week for ultrasound, sonohysterogram, and potential repeat endometrial biopsy.  She will call Dr. Darrall Dears office to inform them of what is happening.    I have sent a copy of her last office visit to Dr. Darnelle Catalan and will send a copy of the next one as well through Epic.

## 2013-07-13 ENCOUNTER — Encounter: Payer: Self-pay | Admitting: Obstetrics and Gynecology

## 2013-07-13 ENCOUNTER — Ambulatory Visit (INDEPENDENT_AMBULATORY_CARE_PROVIDER_SITE_OTHER): Payer: BC Managed Care – PPO | Admitting: Obstetrics and Gynecology

## 2013-07-13 ENCOUNTER — Ambulatory Visit (INDEPENDENT_AMBULATORY_CARE_PROVIDER_SITE_OTHER): Payer: BC Managed Care – PPO

## 2013-07-13 ENCOUNTER — Other Ambulatory Visit: Payer: Self-pay | Admitting: Obstetrics and Gynecology

## 2013-07-13 VITALS — BP 100/62 | HR 52 | Ht 65.75 in | Wt 159.5 lb

## 2013-07-13 DIAGNOSIS — N950 Postmenopausal bleeding: Secondary | ICD-10-CM

## 2013-07-13 NOTE — Progress Notes (Signed)
Subjective  Patient is here for follow up of postmenopausal bleeding.    Patient is on Femara for treatment of breast cancer. Status post bilateral mastectomy.   Bleeding lasted for 10 days and then stopped.  Had blood work done showing estradiol of 47.4, FSH 71.6, and LH 36 on 06/30/13. Endometrial biopsy showed benign endocervical glands and no atypia.  No endometrial tissue was noted.   Patient scheduled for annual exam on 07/21/13.  Had pap last year, but uncertain if had HPV testing or not.   Status post hysteroscopy with dilation and curettage in 2012 for a benign endometrial polyp.  Objective  See ultrasound below - Normal uterus. EMS 2.32 mm.  Normal ovaries.      Sonohysterogram  Consent for procedure.  Speculum placed.  Sterile prep of cervix with betadine.  Tenaculum to anterior cervical lip.  Cannula introduced with ring forceps.  Speculum removed.  Saline injected.  No intracavitary defect noted.  Cesarean section scar identified.  No complications.  Minimal EBL.  Endometrial biopsy  Consent for procedure.  Speculum placed.  Sterile prep of cervix with betadine.  Tenaculum to anterior cervical lip.  Pipelle passed to 8 cm twice.  Minimal tissue obtained.  Tissue sent to pathology.  No complicaitions.  Minimal EBL.    Assessment  Episode of postmenopausal bleeding. Possible menstrual cycle. History of benign endometrial polyp. No evidence of pathology today.  Plan  Follow up on endometrial biopsy. Will attempt to get patient's last pap to see if she had HPV testing done.  If not, will do this at appt on July 21, 2013.

## 2013-07-13 NOTE — Patient Instructions (Signed)
We will contact you after we receive the results from your last pap smear to see if you had high risk HPV testing performed. If not, please keep your appointment for 07/21/13, and I will do it then.

## 2013-07-13 NOTE — Progress Notes (Signed)
Addendum Note  Patient had vital signs, weight, and pelvic exam only but not full physical exam in this date.

## 2013-07-18 ENCOUNTER — Telehealth: Payer: Self-pay | Admitting: Emergency Medicine

## 2013-07-18 NOTE — Telephone Encounter (Signed)
Patient has read mychart message:  Rebecca Fleming - last viewed at 12:14 PM on 07/18/2013

## 2013-07-18 NOTE — Telephone Encounter (Signed)
Message copied by Joeseph Amor on Tue Jul 18, 2013 12:05 PM ------      Message from: Conley Simmonds      Created: Tue Jul 18, 2013 11:40 AM       French Ana,             Please inform patient of negative endometrial biopsy.            Please have her keep a bleeding calendar.  If bleeding recurs, she will need to return to the office for re-evaluation.             Thank you!            Conley Simmonds ------

## 2013-07-18 NOTE — Telephone Encounter (Signed)
Message left to return call to [place] at [phone] also, mychart message sent as well with results.

## 2013-07-21 ENCOUNTER — Encounter: Payer: Self-pay | Admitting: Obstetrics and Gynecology

## 2013-07-21 ENCOUNTER — Ambulatory Visit (INDEPENDENT_AMBULATORY_CARE_PROVIDER_SITE_OTHER): Payer: BC Managed Care – PPO | Admitting: Obstetrics and Gynecology

## 2013-07-21 VITALS — BP 100/60 | HR 78 | Resp 16 | Ht 66.0 in | Wt 160.0 lb

## 2013-07-21 DIAGNOSIS — Z01419 Encounter for gynecological examination (general) (routine) without abnormal findings: Secondary | ICD-10-CM

## 2013-07-21 LAB — LIPID PANEL
HDL: 54 mg/dL (ref >39)
LDL Cholesterol: 154 mg/dL — ABNORMAL HIGH (ref 0–99)
Total CHOL/HDL Ratio: 4.1 Ratio
Triglycerides: 69 mg/dL (ref <150)

## 2013-07-21 NOTE — Progress Notes (Signed)
GYNECOLOGY VISIT   HPI: 47 y.o.   Married  Caucasian  female   G3P3 with Patient's last menstrual period was 06/28/2013.  LMP was 06/20/08. here for   Annual Exam Patient has just completed negative evaluation for postmenopausal bleeding and had a negative EMB (twice due to scanty tissue) and a negative ultrasound and sonohysterogram. No further bleeding other than after endometrial biopsy last week.  Patient is status post bilateral mastectomy for breast cancer and is on Femara.  Previously took Tamoxifen.  Had a respiratory illness last week. Congested, sneezing, and fatigued.  Questions if she had influenza.   Some hot flashes which are tolerable. Some night sweats.  No bladder or bowel complaints.   Hgb:  PCP Urine:  Declined  GYNECOLOGIC HISTORY: Patient's last menstrual period was 06/28/2013. Sexually active:  YES Partner preference: Female Contraception:   None Menopausal hormone therapy: patient is on Femara for breast cancer treatment.  DES exposure:   No Blood transfusions:   Yes Sexually transmitted diseases:   No GYN Procedures:  D&C, Endometrial BX Mammogram:    05/2008              Pap:   2013 neg History of abnormal pap smear:  No   OB History   Grav Para Term Preterm Abortions TAB SAB Ect Mult Living   3 3       1 3        LIFESTYLE: Exercise:  Yes  Jazzercise Instructor             Tobacco: No Alcohol: Occ glass of wine or Beer Drug use:  No  OTHER HEALTH MAINTENANCE: Tetanus/TDap: 01/2011 Gardisil: No Influenza:  No Zostavax: No Pneumonia:  2012  Bone density: Yes 2012   Normal Colonoscopy: No  Cholesterol check: 08/2012 Slightly Elevated, LDL in 140s.    Family History  Problem Relation Age of Onset  . Breast cancer Mother   . Diabetes Mother   . Hypertension Mother   . Stroke Mother   . Heart failure Father   . Hypertension Father     Patient Active Problem List   Diagnosis Date Noted  . Postmenopausal bleeding 07/13/2013  .  Rheumatoid arthritis 01/11/2012  . Psoriasis 01/11/2012  . Hx Breast cancer, IDC, left, Stage I, triple + 07/16/2011    Class: Stage 1   Past Medical History  Diagnosis Date  . Arthritis   . Complication of anesthesia     pt states" gets shakes" with general  . RA (rheumatoid arthritis)   . Anemia   . Blood transfusion without reported diagnosis     greater than 10 yrs ago following surgery  . Cancer 2009    left breast--has bilateral mastectomy  . Fibroid   . Heart murmur   . History of breast cancer   . Scoliosis   . Abnormal uterine bleeding     Past Surgical History  Procedure Laterality Date  . Cesarean section  01/01/2004    Dr Edward Jolly  . Breast lumpectomy w/ needle localization  04/23/2008    w [facility] Dr Jamey Ripa  . Breast lumpectomy  05/17/2008    re excise for margin - Dr Jamey Ripa  . Portacath placement  05/17/2008    Dr Jamey Ripa  . Mastectomy  09/11/2008    bilateral - Dr Jamey Ripa  . Insertion of tissue expander after mastectomy  09/11/2008    Dr Odis Luster  . Port-a-cath removal  09/09/2009    Dr Jamey Ripa  . Umbilical hernia  repair  09/09/2009    Dr Jamey Ripa  . Hysteroscopy w/d&c  07/21/2011    Procedure: DILATATION AND CURETTAGE (D&C) /HYSTEROSCOPY;  Surgeon: Melony Overly;  Location: [facility];  Service: Gynecology;  Laterality: N/A;  with Ultrasound Guidance  . Knee arthroscopy w/ meniscal repair Right     --Dr. Priscille Kluver  . Appendectomy    . Endocervical biopsy  07/13/13    ALLERGIES: Latex  Current Outpatient Prescriptions  Medication Sig Dispense Refill  . albuterol (PROVENTIL HFA;VENTOLIN HFA) 108 (90 BASE) MCG/ACT inhaler Inhale 2 puffs into the lungs every 6 (six) hours as needed. Shortness of breath       . calcium carbonate (OS-CAL) 600 MG TABS tablet Take 600 mg by mouth 2 (two) times daily with a meal.      . cetirizine (ZYRTEC) 10 MG tablet Take 10 mg by mouth daily.        . fluticasone (FLONASE) 50 MCG/ACT nasal spray Place 1 spray into the nose daily.        .  folic acid (FOLVITE) 1 MG tablet Take 2 mg by mouth daily.      Marland Kitchen GLUCOSAMINE PO Take 1 tablet by mouth daily.        . hydroxychloroquine (PLAQUENIL) 200 MG tablet Take 200 mg by mouth 2 (two) times daily.        Marland Kitchen letrozole (FEMARA) 2.5 MG tablet Take 1 tablet (2.5 mg total) by mouth daily.  30 tablet  PRN  . Magnesium 500 MG TABS Take 1 tablet by mouth 2 (two) times daily.      . Manganese 50 MG TABS Take 1 tablet by mouth daily.      Marland Kitchen METHOTREXATE SODIUM, PF, IJ Inject 0.8 mLs as directed once a week.      . montelukast (SINGULAIR) 10 MG tablet Take 10 mg by mouth at bedtime.        . methocarbamol (ROBAXIN) 500 MG tablet Take 500 mg by mouth as needed.      . Olopatadine HCl (PATADAY OP) Apply 1 drop to eye daily.         No current facility-administered medications for this visit.     ROS:  Pertinent items are noted in HPI.  SOCIAL HISTORY:  Married. Retail banker.   PHYSICAL EXAMINATION:    BP 100/60  Pulse 78  Resp 16  Ht 5\' 6"  (1.676 m)  Wt 160 lb (72.576 kg)  BMI 25.84 kg/m2  LMP 06/28/2013   Wt Readings from Last 3 Encounters:  07/21/13 160 lb (72.576 kg)  07/13/13 159 lb 8 oz (72.349 kg)  06/30/13 160 lb 8 oz (72.802 kg)     Ht Readings from Last 3 Encounters:  07/21/13 5\' 6"  (1.676 m)  07/13/13 5' 5.75" (1.67 m)  06/30/13 5' 5.75" (1.67 m)    General appearance: alert, cooperative and appears stated age Head: Normocephalic, without obvious abnormality, atraumatic Neck: no adenopathy, supple, symmetrical, trachea midline and thyroid not enlarged, symmetric, no tenderness/mass/nodules Lungs: clear to auscultation bilaterally Breasts: Consistent with mastectomy and reconstruction.  No masses.  No axillary nodes.  Heart: regular rate and rhythm Abdomen: Pfannenstiel and RLQ oblique incision, soft, non-tender; no masses,  no organomegaly Extremities: extremities normal, atraumatic, no cyanosis or edema Skin: Skin color, texture, turgor normal. No rashes or  lesions Lymph nodes: Cervical, supraclavicular, and axillary nodes normal. No abnormal inguinal nodes palpated Neurologic: Grossly normal  Pelvic: External genitalia:  no lesions  Urethra:  normal appearing urethra with no masses, tenderness or lesions              Bartholins and Skenes: normal                 Vagina: normal appearing vagina with normal color and discharge, no lesions              Cervix: normal appearance              Pap and high risk HPV testing done: yes.            Bimanual Exam:  Uterus:  uterus is normal size, shape, consistency and nontender                                      Adnexa: normal adnexa in size, nontender and no masses                                      Rectovaginal: Confirms                                      Anus:  normal sphincter tone, no lesions  ASSESSMENT  Normal gynecologic exam. Status post bilateral mastectomy for breast cancer.  On Femara.  Recent episode of postmenopausal bleeding.  Negative evaluation.  Elevated LDL cholesterol.  PLAN  Observation for further postmenopausal bleeding.  No further treatment indicated at this time.  Encounters were forwarded to Dr. Darnelle Catalan regarding the postmenopausal bleeding. Breast cancer care per Dr. Darnelle Catalan. Pap smear and high risk HPV testing Fasting lipid profile.  Encouraged flu vaccine.  Next bone density per Dr. Darnelle Catalan in January.  Lipid profile now (Had only small amount of orange juice this am.) Return annually or prn   An After Visit Summary was printed and given to the patient.

## 2013-07-21 NOTE — Patient Instructions (Signed)

## 2013-07-25 LAB — IPS PAP TEST WITH HPV

## 2013-07-27 ENCOUNTER — Other Ambulatory Visit: Payer: Self-pay

## 2013-09-28 ENCOUNTER — Ambulatory Visit
Admission: RE | Admit: 2013-09-28 | Discharge: 2013-09-28 | Disposition: A | Discharge status: Home / Self Care | Payer: BC Managed Care – PPO | Source: Ambulatory Visit | Attending: Oncology | Admitting: Oncology

## 2013-09-28 DIAGNOSIS — Z853 Personal history of malignant neoplasm of breast: Secondary | ICD-10-CM

## 2013-10-05 ENCOUNTER — Other Ambulatory Visit (HOSPITAL_BASED_OUTPATIENT_CLINIC_OR_DEPARTMENT_OTHER): Payer: BC Managed Care – PPO

## 2013-10-05 ENCOUNTER — Ambulatory Visit (HOSPITAL_COMMUNITY)
Admission: RE | Admit: 2013-10-05 | Discharge: 2013-10-05 | Disposition: A | Discharge status: Home / Self Care | Payer: BC Managed Care – PPO | Source: Ambulatory Visit | Attending: Oncology | Admitting: Oncology

## 2013-10-05 DIAGNOSIS — M4120 Other idiopathic scoliosis, site unspecified: Secondary | ICD-10-CM | POA: Insufficient documentation

## 2013-10-05 DIAGNOSIS — Z9010 Acquired absence of unspecified breast and nipple: Secondary | ICD-10-CM | POA: Insufficient documentation

## 2013-10-05 DIAGNOSIS — R911 Solitary pulmonary nodule: Secondary | ICD-10-CM | POA: Insufficient documentation

## 2013-10-05 DIAGNOSIS — Z853 Personal history of malignant neoplasm of breast: Secondary | ICD-10-CM

## 2013-10-05 DIAGNOSIS — R918 Other nonspecific abnormal finding of lung field: Secondary | ICD-10-CM | POA: Insufficient documentation

## 2013-10-05 DIAGNOSIS — K449 Diaphragmatic hernia without obstruction or gangrene: Secondary | ICD-10-CM | POA: Insufficient documentation

## 2013-10-05 DIAGNOSIS — C50419 Malignant neoplasm of upper-outer quadrant of unspecified female breast: Secondary | ICD-10-CM

## 2013-10-05 DIAGNOSIS — Z978 Presence of other specified devices: Secondary | ICD-10-CM | POA: Insufficient documentation

## 2013-10-05 DIAGNOSIS — R091 Pleurisy: Secondary | ICD-10-CM | POA: Insufficient documentation

## 2013-10-05 LAB — COMPREHENSIVE METABOLIC PANEL (CC13)
ALK PHOS: 57 U/L (ref 40–150)
ALT: 19 U/L (ref 0–55)
AST: 19 U/L (ref 5–34)
Albumin: 3.9 g/dL (ref 3.5–5.0)
Anion Gap: 9 mEq/L (ref 3–11)
BILIRUBIN TOTAL: 0.84 mg/dL (ref 0.20–1.20)
BUN: 9.7 mg/dL (ref 7.0–26.0)
CO2: 26 mEq/L (ref 22–29)
Calcium: 9.5 mg/dL (ref 8.4–10.4)
Chloride: 108 mEq/L (ref 98–109)
Creatinine: 0.8 mg/dL (ref 0.6–1.1)
GLUCOSE: 94 mg/dL (ref 70–140)
POTASSIUM: 4.4 meq/L (ref 3.5–5.1)
SODIUM: 143 meq/L (ref 136–145)
Total Protein: 6.8 g/dL (ref 6.4–8.3)

## 2013-10-05 LAB — CBC WITH DIFFERENTIAL/PLATELET
BASO%: 0.8 % (ref 0.0–2.0)
Basophils Absolute: 0 10*3/uL (ref 0.0–0.1)
EOS%: 3.5 % (ref 0.0–7.0)
Eosinophils Absolute: 0.1 10*3/uL (ref 0.0–0.5)
HCT: 41.3 % (ref 34.8–46.6)
HGB: 14.2 g/dL (ref 11.6–15.9)
LYMPH%: 29.3 % (ref 14.0–49.7)
MCH: 32.9 pg (ref 25.1–34.0)
MCHC: 34.3 g/dL (ref 31.5–36.0)
MCV: 95.8 fL (ref 79.5–101.0)
MONO#: 0.4 10*3/uL (ref 0.1–0.9)
MONO%: 11.9 % (ref 0.0–14.0)
NEUT#: 2.1 10*3/uL (ref 1.5–6.5)
NEUT%: 54.5 % (ref 38.4–76.8)
PLATELETS: 190 10*3/uL (ref 145–400)
RBC: 4.32 10*6/uL (ref 3.70–5.45)
RDW: 13.6 % (ref 11.2–14.5)
WBC: 3.8 10*3/uL — ABNORMAL LOW (ref 3.9–10.3)
lymph#: 1.1 10*3/uL (ref 0.9–3.3)

## 2013-10-05 MED ORDER — IOHEXOL 300 MG/ML  SOLN
80.0000 mL | Freq: Once | INTRAMUSCULAR | Status: AC | PRN
  Administered 2013-10-05: 80 mL via INTRAVENOUS

## 2013-10-06 ENCOUNTER — Telehealth: Payer: Self-pay | Admitting: *Deleted

## 2013-10-06 NOTE — Telephone Encounter (Signed)
Called to inform pt of CT results. Pt verbalized understanding . Pt will see Dr. Jana Hakim on Jan.22,2015.

## 2013-10-12 ENCOUNTER — Ambulatory Visit (HOSPITAL_BASED_OUTPATIENT_CLINIC_OR_DEPARTMENT_OTHER): Payer: BC Managed Care – PPO | Admitting: Oncology

## 2013-10-12 VITALS — BP 95/62 | HR 60 | Temp 97.6°F | Resp 20 | Ht 66.0 in | Wt 162.1 lb

## 2013-10-12 DIAGNOSIS — M899 Disorder of bone, unspecified: Secondary | ICD-10-CM

## 2013-10-12 DIAGNOSIS — R21 Rash and other nonspecific skin eruption: Secondary | ICD-10-CM

## 2013-10-12 DIAGNOSIS — Z853 Personal history of malignant neoplasm of breast: Secondary | ICD-10-CM

## 2013-10-12 DIAGNOSIS — M949 Disorder of cartilage, unspecified: Secondary | ICD-10-CM

## 2013-10-12 NOTE — Progress Notes (Signed)
ID: Rebecca Fleming   DOB: [birth year]  MR#: [id number]  CSN#:[id number]  PCP: No PCP Per Patient GYN: Rebecca Fleming SU: Osborn Coho OTHER MD: Rebecca Fleming, Rebecca Fleming, Rebecca Fleming  HISTORY OF PRESENT ILLNESS: Rebecca Fleming had routine mammography in October of 2008, which was very hard to read because of density in the breast and fibrocystic change.  So on August 22, 2007, she had bilateral breast MRIs at [facility].  This showed extensive fibrocystic change and adenosis, particularly in the right breast at the 12 o'clock position.  In the left breast, there was a fibroadenoma and slight enhancement, clumping, and nodularity as well.  A 55-monthrepeat MRI was suggested [facility]  This showed a 1.7 cm mildly spiculated mass in the left breast with MR features highly suspicious for malignancy.  There were no other suspicious areas in either breast and the lymph nodes also appeared normal.  Ultrasound of the left breast July 1st was able to locate the mass and biopsy was obtained the same day. This showed (([id number] an invasive ductal carcinoma, which was 98% positive for the estrogen receptor, 99% positive for the progesterone receptor, with a low mib-1 at 16% and Hercept test equivocal at 2+, with FISH showing amplification with a ratio of 3.88.    With this information, the patient was referred to Dr. SMargot Fleming  After appropriate discussion, she underwent left lumpectomy and sentinel lymph node biopsy April 23, 2008.  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  INTERVAL HISTORY: WTygerreturns today for follow-up of her breast cancer accompanied by her husband Rebecca Fleming Interval history is significant for her having had a  good.", Which led to full valuation by Dr. SQuincy Fleming showing no evidence of endometrial cancer. The symptoms have not recurred.   REVIEW OF SYSTEMS: She is losing her hair little. She is having some insomnia. She recently had "the flu" but has gotten over that with only symptomatic over-the-counter medications. 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 Hot flashes are moderate. A detailed review of systems today was otherwise stable  PAST MEDICAL HISTORY: Past Medical History  Diagnosis Date  . Arthritis   . Complication of anesthesia     pt states" gets shakes" with general  . RA (rheumatoid arthritis)   . Anemia   . Blood transfusion without reported diagnosis     greater than 10 yrs ago following surgery  . Cancer 2009    left breast--has bilateral mastectomy  . Fibroid   . Heart murmur   . History of breast cancer   . Scoliosis   . Abnormal uterine bleeding   Significant for scoliosis, rhinoplasty, iron deficiency anemia secondary to menstruation, status post C-section in 2005, history of left shoulder arthroscopy 2007, removal of a cyst from her left foot, removal of a mass from her left breast at age 48 status post LASIX surgery, removal of 2 moles from her skin, both benign.  History of exercise-induced asthma.  Remote history of migraines.  Mild GERD.  Mild problems with hemorrhoids.    PAST SURGICAL HISTORY: Past Surgical History  Procedure Laterality Date  . Cesarean section  01/01/2004    Dr SQuincy Fleming . Breast lumpectomy w/ needle  localization  04/23/2008    w [facility] Dr Rebecca Fleming  . Breast lumpectomy  05/17/2008    re excise for margin - Dr Rebecca Fleming  . Portacath placement  05/17/2008    Dr Rebecca Fleming  . Mastectomy  09/11/2008    bilateral - Dr Rebecca Fleming  . Insertion of tissue expander after mastectomy  09/11/2008    Dr Rebecca Fleming  . Port-a-cath removal  09/09/2009    Dr Rebecca Fleming  . Umbilical hernia repair  09/09/2009    Dr Rebecca Fleming  . Hysteroscopy w/d&c  07/21/2011     Procedure: DILATATION AND CURETTAGE (D&C) /HYSTEROSCOPY;  Surgeon: Rebecca Fleming;  Location: [facility];  Service: Gynecology;  Laterality: N/A;  with Ultrasound Guidance  . Knee arthroscopy w/ meniscal repair Right     --Dr. Telford Fleming  . Appendectomy    . Endometrial biopsy  06/2013    FAMILY HISTORY The patient's father died from an MI in his late 81's. The patient's mother is alive at age 82.  She was diagnosed with breast cancer at age 21.  She is BRCA 1 and 2 negative.  The patient has two brothers, both in good shape. There is no other history of breast or ovarian cancer in the family.   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  She also carried her own child to term.  Last menstrual period was 2009; she had a D&C due to a thickened endometrial strip 2012; she has had some spotting last 2 months. [place], most recently Jan 2013 under Rebecca Fleming, normal (per pt)  SOCIAL HISTORY: Rebecca Fleming works at the [employer].  Her husband, Rebecca Fleming, present today, is a Customer service manager.  They have a son, Rebecca Fleming, currently 28, who has been admitted to [facility] in Engineer, production..  The patient is not a church attender.     ADVANCED DIRECTIVES: not in place  HEALTH MAINTENANCE: History  Substance Use Topics  . Smoking status: Never Smoker   . Smokeless tobacco: Never Used  . Alcohol Use: 0.5 oz/week    1 drink(s) per week     Comment: occ glass of wine or beer     Colonoscopy: n/a  PAP: UTD  Bone density: 08/31/2011/ normal  Lipid panel:  Allergies  Allergen Reactions  . Latex     stuffiness    Current Outpatient Prescriptions  Medication Sig Dispense Refill  . albuterol (PROVENTIL HFA;VENTOLIN HFA) 108 (90 BASE) MCG/ACT inhaler Inhale 2 puffs into the lungs every 6 (six) hours as needed. Shortness of breath       . calcium carbonate (OS-CAL) 600 MG TABS tablet Take 600 mg by mouth 2 (two) times daily with a  meal.      . cetirizine (ZYRTEC) 10 MG tablet Take 10 mg by mouth daily.        . fluticasone (FLONASE) 50 MCG/ACT nasal spray Place 1 spray into the nose daily.        . folic acid (FOLVITE) 1 MG tablet Take 2 mg by mouth daily.      Marland Kitchen GLUCOSAMINE PO Take 1 tablet by mouth daily.        . hydroxychloroquine (PLAQUENIL) 200 MG tablet Take 200 mg by mouth 2 (two) times daily.        Marland Kitchen letrozole (FEMARA) 2.5 MG tablet Take 1 tablet (2.5 mg total) by mouth daily.  30 tablet  PRN  . Magnesium 500 MG TABS Take 1 tablet  by mouth 2 (two) times daily.      . Manganese 50 MG TABS Take 1 tablet by mouth daily.      . methocarbamol (ROBAXIN) 500 MG tablet Take 500 mg by mouth as needed.      . METHOTREXATE SODIUM, PF, IJ Inject 0.8 mLs as directed once a week.      . montelukast (SINGULAIR) 10 MG tablet Take 10 mg by mouth at bedtime.        . Olopatadine HCl (PATADAY OP) Apply 1 drop to eye daily.         No current facility-administered medications for this visit.    OBJECTIVE: Middle-aged white woman in no acute distress Filed Vitals:   10/12/13 0850  BP: 95/62  Pulse: 60  Temp: 97.6 F (36.4 C)  Resp: 20     Body mass index is 26.18 kg/(m^2).    ECOG FS: 1  Sclerae unicteric Oropharynx clear No peripheral adenopathy Lungs no rales or rhonchi, good excursion bilaterally Heart regular rate and rhythm, no murmurs are noted today  Abd benign MSK scoliosis but no focal spinal tenderness, no peripheral edema Neuro: nonfocal, well oriented, appropriate affect Breasts: status post bilateral mastectomies with silicone reconstruction; no evidence of local recurrence; both axillae are benign Skin: She has a slight irregularity in the superior a slightly lateral aspect of her left reconstructed breast. There is also [place] little bit of a rash in the upper part of the sternum, which is erythematous, blanching, and minimally palpable. Neither looks suspicious.  LAB RESULTS:  Lab Results  Component  Value Date   WBC 3.8* 10/05/2013   NEUTROABS 2.1 10/05/2013   HGB 14.2 10/05/2013   HCT 41.3 10/05/2013   MCV 95.8 10/05/2013   PLT 190 10/05/2013      Chemistry      Component Value Date/Time   NA 143 10/05/2013 0911   NA 143 01/20/2012 0856   K 4.4 10/05/2013 0911   K 4.5 01/20/2012 0856   CL 104 12/26/2012 1213   CL 106 01/20/2012 0856   CO2 26 10/05/2013 0911   CO2 29 01/20/2012 0856   BUN 9.7 10/05/2013 0911   BUN 10 01/20/2012 0856   CREATININE 0.8 10/05/2013 0911   CREATININE 0.73 01/20/2012 0856      Component Value Date/Time   CALCIUM 9.5 10/05/2013 0911   CALCIUM 9.6 01/20/2012 0856   ALKPHOS 57 10/05/2013 0911   ALKPHOS 54 12/28/2011 0934   AST 19 10/05/2013 0911   AST 18 12/28/2011 0934   ALT 19 10/05/2013 0911   ALT 17 12/28/2011 0934   BILITOT 0.84 10/05/2013 0911   BILITOT 0.8 12/28/2011 0934       Lab Results  Component Value Date   LABCA2 23 07/02/2011    No components found with this basename: [id number]    No results found for this basename: INR,  in the last 168 hours  Urinalysis    Component Value Date/Time   COLORURINE YELLOW 07/21/2011 [place] 07/21/2011 1344   LABSPEC >1.030* 07/21/2011 1344   PHURINE 6.0 07/21/2011 1344   GLUCOSEU NEGATIVE 07/21/2011 Rebecca Fleming 07/21/2011 [place] 07/21/2011 1344   KETONESUR 15* 07/21/2011 1344   PROTEINUR NEGATIVE 07/21/2011 1344   UROBILINOGEN 0.2 07/21/2011 1344   NITRITE NEGATIVE 07/21/2011 1344   LEUKOCYTESUR NEGATIVE 07/21/2011 1344    STUDIES:  Ct Chest W Contrast  10/05/2013   CLINICAL DATA:  History breast cancer  diagnosed in 2009 status post bilateral mastectomy and chemotherapy which is now complete. Followup evaluation of lung nodule noted on prior CT scan.  EXAM: CT CHEST WITH CONTRAST  TECHNIQUE: Multidetector CT imaging of the chest was performed during intravenous contrast administration.  CONTRAST:  64m OMNIPAQUE IOHEXOL 300 MG/ML  SOLN  COMPARISON:  Chest CT  10/06/2012.  FINDINGS: Mediastinum: Moderate size hiatal hernia. Heart size is normal. There is no significant pericardial fluid, thickening or pericardial calcification. No pathologically enlarged mediastinal or hilar lymph nodes. Separate origin of left vertebral artery directly off the aortic arch (normal anatomical variant) incidentally noted.  Lungs/Pleura: 3 mm nodule in the periphery of the left lower lobe (image 46 of series 5) is unchanged. 3 mm nodule in the periphery of the right lower lobe (image 53 of series 5) is also unchanged. Previously noted 2 mm nodule in the right lower lobe is no longer identified. Previously noted 5 mm right lower lobe nodule is unchanged (image 31 of series 5). No other larger more suspicious appearing pulmonary nodules or masses are otherwise identified on today's examination. No acute consolidative airspace disease. Mild bilateral apical pleuroparenchymal thickening, presumably post-infectious, similar to prior study. Mild dependent atelectasis in the left lower lobe.  Upper Abdomen: Unremarkable.  Musculoskeletal: Status post bilateral modified radical mastectomy and bilateral subpectoral breast implants. Dextroscoliosis of the thoracic spine. There are no aggressive appearing lytic or blastic lesions noted in the visualized portions of the skeleton.  IMPRESSION: 1. No significant change in numerous tiny subcentimeter pulmonary nodules scattered throughout the lungs bilaterally, the majority of which measure 3 mm or less in size and can be considered benign. Previously noted 5 mm right lower lobe pulmonary nodule is also stable and strongly favored to be benign. An additional 1 year followup chest CT could be performed if clinically desired, however, this is likely unwarranted. 2. Moderate size hiatal hernia. 3. Status post bilateral modified radical mastectomy and breast reconstruction.   Electronically Signed   By: DVinnie LangtonM.D.   On: 10/05/2013 11:35   Dg Bone  Density  09/28/2013   CLINICAL DATA:  48year old postmenopausal white female with history of rheumatoid arthritis with prior history of short-term prednisone use, currently taking methotrexate and letrozole. Patient is also taking calcium and vitamin-D supplements.  EXAM: DUAL X-RAY ABSORPTIOMETRY (DXA) FOR BONE MINERAL DENSITY  COMPARISON:  Comparison is made to the most recent prior exam from 08/31/2011; since the prior exam there has been a significant 8.2% decrease in bone mineral density of the lumbar spine and a significant 4.1% decrease in bone mineral density of the left hip as well as a significant 3.3% decrease in bone mineral density of the left forearm.  FINDINGS: AP LUMBAR SPINE (PATIENT HAS A SEVERE SCOLIOSIS)  Bone Mineral Density (BMD):  0.889 g/cm2  Young Adult T Score:  -1.4  Z Score:  -0.8  LEFT FEMUR NECK  Bone Mineral Density (BMD):  0.707 g/cm2  Young Adult T Score: -1.3  Z Score:  -0.6  LEFT FOREARM (1/3 RADIUS)  Bone Mineral Density (BMD):  0.753  Young Adult T Score:  1  Z Score:  1.7  ASSESSMENT: Patient's diagnostic category is LOW BONE MASS by WHO Criteria.  FRACTURE RISK: INCREASED  FRAX:  Based on the WPriest Rivermodel, the 10 year probability of a major osteoporotic fracture is 5.3%. The 10 year probability of a hip fracture is 0.4%.  RECOMMENDATIONS: Effective therapies are available in the form of  bisphosphonates, selective estrogen receptor modulators, biologic agents, and hormone replacement therapy (for women). All patients should ensure an adequate intake of dietary calcium (1246m daily) and vitamin D (800 IU daily) unless contraindicated.  All treatment decisions require clinical judgement and consideration of individual patient factors, including patient preferences, co-morbidities, previous drug use, risk factors not captured in the FRAX model (e.g., frailty, falls, vitamin D deficiency, increased bone turnover, interval significant decline in bone  density) and possible under-or over-estimation of fracture risk by FRAX.  The National Osteoporosis Foundation recommends that FDA-approved medical  therapies be considered in postmenopausal women and mean age 2862or older with a:  1. Hip or vertebral (clinical or morphometric) fracture.  2. T-score of -2.5 or lower at the spine or hip.  3. Ten-year fracture probability by FRAX of 3% or greater for hip fracture or 20% or greater for major osteoporotic fracture.  FOLLOW UP:  People with diagnosed cases of osteoporosis or at high risk for fracture should have regular bone mineral density tests. For patients eligible for Medicare, routine testing is allowed once every 2 years. The testing frequency can be increased to one year for patients who have rapidly progressing disease, those who are receiving or discontinuing medical therapy to restore bone mass, or have additional risk factors.  World HPharmacologist([facility] Criteria:  Normal: T scores from +1.0 to -1.0  Low Bone Mass (Osteopenia): T scores between -1.0 and -2.5  Osteoporosis: T scores -2.5 and below  Comparison to Reference Population:  T score is the key measure used in the diagnosis of osteoporosis and relative risk determination for fracture. It provides a value for bone mass relative to the mean bone mass of a young adult reference population expressed in terms of standard deviation (SD).  Z score is the age-matched score showing the patient's values compared to a population matched for age, sex, and race. This is also expressed in terms of standard deviation. The patient may have values that compare favorably to the age-matched values and still be at increased risk for fracture.   Electronically Signed   By: JEverlean AlstromM.D.   On: 09/28/2013 13:42   CLINICAL DATA: 48year old postmenopausal white female with history  of rheumatoid arthritis with prior history of short-term prednisone  use, currently taking methotrexate and letrozole. Patient is  also  taking calcium and vitamin-D supplements.  EXAM:  DUAL X-RAY ABSORPTIOMETRY (DXA) FOR BONE MINERAL DENSITY  COMPARISON: Comparison is made to the most recent prior exam from  08/31/2011; since the prior exam there has been a significant 8.2%  decrease in bone mineral density of the lumbar spine and a  significant 4.1% decrease in bone mineral density of the left hip as  well as a significant 3.3% decrease in bone mineral density of the  left forearm.  FINDINGS:  AP LUMBAR SPINE (PATIENT HAS A SEVERE SCOLIOSIS)  Bone Mineral Density (BMD): 0.889 g/cm2  Young Adult T Score: -1.4  Z Score: -0.8  LEFT FEMUR NECK  Bone Mineral Density (BMD): 0.707 g/cm2  Young Adult T Score: -1.3  Z Score: -0.6  LEFT FOREARM (1/3 RADIUS)  Bone Mineral Density (BMD): 0.753  Young Adult T Score: 1  Z Score: 1.7  ASSESSMENT: Patient's diagnostic category is LOW BONE MASS by WHO  Criteria.  FRACTURE RISK: INCREASED  FRAX:  Based on the WFultondalemodel, the 10 year  probability of a major osteoporotic fracture is 5.3%. The 10 year  probability of a  hip fracture is 0.4%.    ASSESSMENT: 48 y.o.  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   (2) received adjuvantTaxotere and Cytoxan x4, then Herceptin to complete a year in October 2010.    (3) s/p bilateral mastectomies with implant placement November 2009   (4) on tamoxifen January 2010 -- January 2013, when she was switched to letrozole, discontinued in January of 2015.   (5) scoliosis and chronic low back pain, evaluated by MRI showing DDD  (6) multiple nonspecific pulmonary nodules noted January 2014 on CT of the chest-- repeat chest CT JAN 2015 showed no change (benign)  (7) osteopenia, with T score of -1.4 on bone density  09/28/2048   PLAN:  Maribeth has completed 5 years of optimal antiestrogen therapy. We do not have grade 1 data to tell us that continuing antiestrogen therapy beyond this point is helpful, unhelpful, or neutral. She is getting some hair thinning and some osteopenia from the letrozole. Accordingly my vote is for her to stop at this point. She is in agreement with this plan and very happy to "graduate" from followup today.  I think the minimal rash that she has in the upper anterior chest is not going to be related to her breast cancer. I suggested she try some Cortaid for that. The slight irregularity in the upper left reconstructed breast this needs followup. Solis there is no change, it requires no other intervention.  I do not believe we need to repeat a CT of the chest any further, but certainly if there were any pulmonary issues in the future that would be the test that she should have just to make sure these little scars she has in the lungs remain unchanged.  All she needs is a yearly physician breast exam, which Dr. Quincy Fleming can perform. I Rebecca Fleming be glad to see Sheba again at anytime as needed, but as of now no further routine appointments are being made for her here.  MAGRINAT,GUSTAV C    10/12/2013

## 2014-05-25 ENCOUNTER — Encounter: Payer: Self-pay | Admitting: Obstetrics and Gynecology

## 2014-07-23 ENCOUNTER — Encounter: Payer: Self-pay | Admitting: Obstetrics and Gynecology

## 2014-07-26 ENCOUNTER — Ambulatory Visit: Payer: BC Managed Care – PPO | Admitting: Obstetrics and Gynecology

## 2014-08-01 ENCOUNTER — Ambulatory Visit (INDEPENDENT_AMBULATORY_CARE_PROVIDER_SITE_OTHER): Payer: BC Managed Care – PPO | Admitting: Obstetrics and Gynecology

## 2014-08-01 ENCOUNTER — Encounter: Payer: Self-pay | Admitting: Obstetrics and Gynecology

## 2014-08-01 VITALS — BP 100/60 | HR 60 | Resp 16 | Ht 66.0 in | Wt 163.8 lb

## 2014-08-01 DIAGNOSIS — Z01419 Encounter for gynecological examination (general) (routine) without abnormal findings: Secondary | ICD-10-CM

## 2014-08-01 DIAGNOSIS — Z0000 Encounter for general adult medical examination without abnormal findings: Secondary | ICD-10-CM

## 2014-08-01 DIAGNOSIS — N950 Postmenopausal bleeding: Secondary | ICD-10-CM

## 2014-08-01 LAB — LIPID PANEL
CHOLESTEROL: 238 mg/dL — AB (ref 0–200)
HDL: 68 mg/dL (ref >39)
LDL Cholesterol: 158 mg/dL — ABNORMAL HIGH (ref 0–99)
Total CHOL/HDL Ratio: 3.5 Ratio
Triglycerides: 61 mg/dL (ref <150)
VLDL: 12 mg/dL (ref 0–40)

## 2014-08-01 LAB — POCT URINALYSIS DIPSTICK
BILIRUBIN UA: NEGATIVE
Glucose, UA: NEGATIVE
Ketones, UA: NEGATIVE
LEUKOCYTES UA: NEGATIVE
Nitrite, UA: NEGATIVE
PH UA: 5
Protein, UA: NEGATIVE
Urobilinogen, UA: NEGATIVE

## 2014-08-01 NOTE — Patient Instructions (Signed)

## 2014-08-01 NOTE — Progress Notes (Signed)
Patient ID: Rebecca Fleming, female   DOB: [birth year], 48 y.o.   MRN: [id number] 48 y.o. G2P2 MarriedCaucasianF here for annual exam.    Patient with postmenopausal bleeding again.  Prior menses was October 2014.  Bleeding 07/27/14 until now.  Pad change once a day.  Some tenderness and soreness.  Seen last year on 06/30/13 for bleeding as well.  Had blood work done showing estradiol of 47.4, [place] 71.6, and LH 36 on 06/30/13. Endometrial biopsy showed benign endocervical glands and no atypia. No endometrial tissue was noted. Ultrasound done 07/13/14 -  Normal uterus. EMS 2.32 mm. Normal ovaries.  Status post hysteroscopy with dilation and curettage in 2012 for a benign endometrial polyp.  Was released from Dr. Jana Hakim in January 2015.  Not taking any Letrozole or Tamoxifen since January 2015.   Has some hot flashes.   Has rash on anterior chest below necklace line.   Sees Dr. Corena Pilgrim for her rheumatoid arthritis.   Son at [facility] [facility] in [place].   PCP:  None  Patient's last menstrual period was 06/28/2013. Patient states began having bleeding 07-27-14 and is still bleeding.       Sexually active: Yes.   female The current method of family planning is post menopausal status.    Exercising: Yes.    aerobics, strength training and dance. Smoker:  no  Health Maintenance: Pap:  07-21-13 wnl:neg HR HPV History of abnormal Pap:  no MMG:  2009 --Hx bilateral mastectomy for breast cancer (left). Colonoscopy:   --- BMD:   09-28-13 normal:[facility] TDaP:  2012 Screening Labs:  Hb today: 14.2 with Dr. Jana Hakim 09/2013, Urine today: Trace RBC's--see LMP   reports that she has never smoked. She has never used smokeless tobacco. She reports that she drinks about 0.6 oz of alcohol per week. She reports that she does not use illicit drugs.  Past Medical History  Diagnosis Date  . Arthritis   . Complication of anesthesia     pt states" gets shakes" with general  . RA (rheumatoid  arthritis)   . Anemia   . Blood transfusion without reported diagnosis     greater than 10 yrs ago following surgery  . Cancer 2009    left breast--has bilateral mastectomy  . Fibroid   . Heart murmur   . History of breast cancer   . Scoliosis   . Abnormal uterine bleeding     Past Surgical History  Procedure Laterality Date  . Cesarean section  01/01/2004    Dr Quincy Simmonds  . Breast lumpectomy w/ needle localization  04/23/2008    w [facility] Dr Margot Chimes  . Breast lumpectomy  05/17/2008    re excise for margin - Dr Margot Chimes  . Portacath placement  05/17/2008    Dr Margot Chimes  . Mastectomy  09/11/2008    bilateral - Dr Margot Chimes  . Insertion of tissue expander after mastectomy  09/11/2008    Dr Harlow Mares  . Port-a-cath removal  09/09/2009    Dr Margot Chimes  . Umbilical hernia repair  09/09/2009    Dr Margot Chimes  . Hysteroscopy w/d&c  07/21/2011    Procedure: DILATATION AND CURETTAGE (D&C) /HYSTEROSCOPY;  Surgeon: Arloa Koh;  Location: [facility];  Service: Gynecology;  Laterality: N/A;  with Ultrasound Guidance  . Knee arthroscopy w/ meniscal repair Right     --Dr. Telford Nab  . Appendectomy    . Endometrial biopsy  06/2013    Current Outpatient Prescriptions  Medication Sig Dispense Refill  . albuterol (  PROVENTIL HFA;VENTOLIN HFA) 108 (90 BASE) MCG/ACT inhaler Inhale 2 puffs into the lungs every 6 (six) hours as needed. Shortness of breath     . calcium carbonate (OS-CAL) 600 MG TABS tablet Take 600 mg by mouth 2 (two) times daily with a meal.    . cetirizine (ZYRTEC) 10 MG tablet Take 10 mg by mouth daily.      . folic acid (FOLVITE) 1 MG tablet Take 2 mg by mouth daily.    Marland Kitchen GLUCOSAMINE PO Take 1 tablet by mouth daily.      . hydroxychloroquine (PLAQUENIL) 200 MG tablet Take 200 mg by mouth 2 (two) times daily.      . Magnesium 500 MG TABS Take 1 tablet by mouth 2 (two) times daily.    . Manganese 50 MG TABS Take 1 tablet by mouth daily.    . methotrexate (RHEUMATREX) 2.5 MG tablet Take 8-10 mg by mouth  once a week. Caution:Chemotherapy. Protect from light.    . montelukast (SINGULAIR) 10 MG tablet Take 10 mg by mouth at bedtime.      . Triamcinolone Acetonide (NASACORT ALLERGY 24HR NA) Place into the nose.     No current facility-administered medications for this visit.    Family History  Problem Relation Age of Onset  . Breast cancer Mother   . Diabetes Mother   . Hypertension Mother   . Stroke Mother   . Heart failure Father   . Hypertension Father     ROS:  Pertinent items are noted in HPI.  Otherwise, a comprehensive ROS was negative.  Exam:   BP 100/60 mmHg  Pulse 60  Resp 16  Ht 5\' 6"  (1.676 m)  Wt 163 lb 12.8 oz (74.299 kg)  BMI 26.45 kg/m2  LMP 06/28/2013     Height: 5\' 6"  (167.6 cm)  Ht Readings from Last 3 Encounters:  08/01/14 5\' 6"  (1.676 m)  10/12/13 5\' 6"  (1.676 m)  07/21/13 5\' 6"  (1.676 m)    General appearance: alert, cooperative and appears stated age Head: Normocephalic, without obvious abnormality, atraumatic Neck: no adenopathy, supple, symmetrical, trachea midline and thyroid normal to inspection and palpation Lungs: clear to auscultation bilaterally Breasts: Bilateral mastectomy wtih implants.  No nipple sparing.  Heart: regular rate and rhythm Abdomen: soft, non-tender; bowel sounds normal; no masses,  no organomegaly Extremities: extremities normal, atraumatic, no cyanosis or edema Skin: Skin color, texture, turgor normal. No rashes or lesions Lymph nodes: Cervical, supraclavicular, and axillary nodes normal. No abnormal inguinal nodes palpated Neurologic: Grossly normal   Pelvic: External genitalia:  no lesions              Urethra:  normal appearing urethra with no masses, tenderness or lesions              Bartholins and Skenes: normal                 Vagina: normal appearing vagina with normal color and discharge, no lesions              Cervix: no lesions.  Menstrual blood noted.               Pap taken: Yes.   Bimanual Exam:   Uterus:  normal size, contour, position, consistency, mobility, non-tender              Adnexa: normal adnexa               Rectovaginal: Confirms  Anus:  normal sphincter tone, no lesions  A:  Well Woman with normal exam Recurrent postmenopausal bleeding.  Status post bilateral mastectomy for breast cancer.   P:   Mammogram not indicated.  Check FSH and estradiol. Anticipate sonohysterogram and endometrial biopsy.  Check cholesterol. pap smear and HR HPV done.  return annually or prn  An After Visit Summary was printed and given to the patient.

## 2014-08-02 LAB — FOLLICLE STIMULATING HORMONE: FSH: 74.4 m[IU]/mL

## 2014-08-02 LAB — IPS PAP TEST WITH HPV

## 2014-08-02 LAB — ESTRADIOL: ESTRADIOL: 31.2 pg/mL

## 2014-08-23 ENCOUNTER — Other Ambulatory Visit: Payer: Self-pay | Admitting: Oncology

## 2014-08-23 ENCOUNTER — Encounter: Payer: Self-pay | Admitting: Obstetrics and Gynecology

## 2014-08-23 ENCOUNTER — Ambulatory Visit (INDEPENDENT_AMBULATORY_CARE_PROVIDER_SITE_OTHER): Payer: BC Managed Care – PPO

## 2014-08-23 ENCOUNTER — Other Ambulatory Visit: Payer: BC Managed Care – PPO | Admitting: Obstetrics and Gynecology

## 2014-08-23 ENCOUNTER — Other Ambulatory Visit: Payer: Self-pay | Admitting: Obstetrics and Gynecology

## 2014-08-23 ENCOUNTER — Other Ambulatory Visit: Payer: BC Managed Care – PPO

## 2014-08-23 ENCOUNTER — Ambulatory Visit (INDEPENDENT_AMBULATORY_CARE_PROVIDER_SITE_OTHER): Payer: BC Managed Care – PPO | Admitting: Obstetrics and Gynecology

## 2014-08-23 DIAGNOSIS — N950 Postmenopausal bleeding: Secondary | ICD-10-CM

## 2014-08-23 NOTE — Patient Instructions (Signed)
Please contact Dr. Virgie Dad office with your questions regarding any further breast cancer prevention care.

## 2014-08-23 NOTE — Progress Notes (Signed)
Subjective  Patient is here today for sonohysterogram and endometrial biopsy for postmenopausal bleeding.   FSH 78.4, estradiol 31.2 on 08/01/14.  History of breast cancer.  Now off all Tamoxifen and Letrozole.   Objective  Pelvic ultrasound - images and report reviewed with patient.   Uterus with EMS 3.89 mm and small cystic area in endometrium.  No myometrial masses.  Left ovary with follicular activity.  Right ovary no follicular activity.  No free fluid.       Procedure - sonohysterogram Consent performed. Speculum placed in vagina. Sterile prep of cervix with betadine. Cannula placed inside endometrial cavity without difficult. Speculum removed. Sterile saline injected.     No         filling defect noted. Cannula removed. No complication.   Procedure - endometrial biopsy Consent performed. Speculum place in vagina.  Sterile prep of cervix with betadine.  Pipelle placed to    8      cm without difficulty three times Tissue obtained and sent to pathology. Speculum removed.  No complications.  Assessment  Postmenopausal bleeding? Looks more like perimenopausal bleeding based on estradiol level and ovarian follicular activity.  History of breast cancer.  Status post bilateral mastectomy. Off all therapy.   Plan  Follow up endometrial biopsy.  I am not anticipating any surgical intervention at this time.  Will send a copy of this note to Dr. Jana Hakim to keep him informed as patient is asking how this affects any further breast cancer prevention care.  Patient will be calling Dr. Virgie Dad office.  15 minutes face to face time of which over 50% was spent in counseling.   After visit summary to patient.

## 2014-08-24 ENCOUNTER — Telehealth: Payer: Self-pay | Admitting: Oncology

## 2014-08-27 LAB — IPS OTHER TISSUE BIOPSY

## 2014-09-11 ENCOUNTER — Telehealth: Payer: Self-pay | Admitting: Oncology

## 2014-09-11 NOTE — Telephone Encounter (Signed)
, °

## 2014-09-26 ENCOUNTER — Ambulatory Visit: Payer: BC Managed Care – PPO | Admitting: Oncology

## 2014-11-07 ENCOUNTER — Telehealth: Payer: Self-pay | Admitting: Oncology

## 2014-11-07 ENCOUNTER — Ambulatory Visit (HOSPITAL_BASED_OUTPATIENT_CLINIC_OR_DEPARTMENT_OTHER): Payer: BLUE CROSS/BLUE SHIELD | Admitting: Oncology

## 2014-11-07 VITALS — BP 112/62 | HR 57 | Temp 98.2°F | Resp 18 | Ht 66.0 in | Wt 162.6 lb

## 2014-11-07 DIAGNOSIS — C50912 Malignant neoplasm of unspecified site of left female breast: Secondary | ICD-10-CM

## 2014-11-07 DIAGNOSIS — N950 Postmenopausal bleeding: Secondary | ICD-10-CM

## 2014-11-07 DIAGNOSIS — Z853 Personal history of malignant neoplasm of breast: Secondary | ICD-10-CM

## 2014-11-07 DIAGNOSIS — M8580 Other specified disorders of bone density and structure, unspecified site: Secondary | ICD-10-CM

## 2014-11-07 DIAGNOSIS — M069 Rheumatoid arthritis, unspecified: Secondary | ICD-10-CM

## 2014-11-07 NOTE — Addendum Note (Signed)
Addended by: Laureen Abrahams on: 11/07/2014 10:28 AM   Modules accepted: Orders, Medications

## 2014-11-07 NOTE — Progress Notes (Signed)
ID: Vilma Meckel   DOB: July 11, 1966  MR#: [id number]  NLZ#:[id number]  PCP: No PCP Per Patient GYN: Josefa Half SU: Osborn Coho OTHER MD: Joni Fears, Crissie Reese, Arloa Koh  HISTORY OF PRESENT ILLNESS: From the original intake note:  Ms. Lanius had routine mammography in October of 2008, which was very hard to read because of density in the breast and fibrocystic change.  So on August 22, 2007, she had bilateral breast MRIs at [facility].  This showed extensive fibrocystic change and adenosis, particularly in the right breast at the 12 o'clock position.  In the left breast, there was a fibroadenoma and slight enhancement, clumping, and nodularity as well.  A 73-monthrepeat MRI was suggested and [facility].  This showed a 1.7 cm mildly spiculated mass in the left breast with MR features highly suspicious for malignancy.  There were no other suspicious areas in either breast and the lymph nodes also appeared normal.  Ultrasound of the left breast July 1st was able to locate the mass and biopsy was obtained the same day. This showed (([id number] an invasive ductal carcinoma, which was 98% positive for the estrogen receptor, 99% positive for the progesterone receptor, with a low mib-1 at 16% and Hercept test equivocal at 2+, with FISH showing amplification with a ratio of 3.88.    With this information, the patient was referred to Dr. SMargot Chimes  After appropriate discussion, she underwent left lumpectomy and sentinel lymph node biopsy April 23, 2008.  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  Her subsequent history as detailed below  INTERVAL HISTORY: WDewannareturns today for follow-up of her breast cancer. We  had released her from follow-up, but then late last year she had some postmenopausal bleeding and she was evaluated by Dr. SQuincy Simmondswith pelvic ultrasonography and biopsy, which was benign. However [facility] to whether she should go back on anti-estrogens. She is here today for that discussion   REVIEW OF SYSTEMS: She is tolerating the methotrexate and Plaquenil well and that is keeping the rheumatoid arthritis under control. She has mild seasonal allergies and sinus symptoms with minimal epistaxis. She still has a spot on her upper anterior chest that she wants me to look at again. Reviewing her past tolerance of tamoxifen, she generally did well with that, with mild hot flashes and no vaginal discharge that she recalls. She did not have hair thinning with that drug as she did with the aromatase inhibitors. A detailed review of systems today was otherwise stable  PAST MEDICAL HISTORY: Past Medical History  Diagnosis Date  . Arthritis   . Complication of anesthesia     pt states" gets shakes" with general  . RA (rheumatoid arthritis)   . Anemia   . Blood transfusion without reported diagnosis     greater than 10 yrs ago following surgery  . Cancer 2009    left breast--has bilateral mastectomy  . Fibroid   . Heart murmur   . History of breast cancer   . Scoliosis   . Abnormal uterine bleeding   Significant for scoliosis, rhinoplasty, iron deficiency anemia secondary to menstruation, status post C-section in 2005, history of left shoulder arthroscopy 2007, removal of a cyst  from her left foot, removal of a mass from her left breast at age 47, status post LASIX surgery, removal of 2 moles from her skin, both benign.  History of exercise-induced asthma.  Remote history of migraines.  Mild GERD.  Mild problems with hemorrhoids.    PAST SURGICAL HISTORY: Past Surgical History  Procedure Laterality Date  . Cesarean section  01/01/2004    Dr Quincy Simmonds  . Breast lumpectomy w/  needle localization  04/23/2008    w [facility] Dr Margot Chimes  . Breast lumpectomy  05/17/2008    re excise for margin - Dr Margot Chimes  . Portacath placement  05/17/2008    Dr Margot Chimes  . Mastectomy  09/11/2008    bilateral - Dr Margot Chimes  . Insertion of tissue expander after mastectomy  09/11/2008    Dr Harlow Mares  . Port-a-cath removal  09/09/2009    Dr Margot Chimes  . Umbilical hernia repair  09/09/2009    Dr Margot Chimes  . Hysteroscopy w/d&c  07/21/2011    Procedure: DILATATION AND CURETTAGE (D&C) /HYSTEROSCOPY;  Surgeon: Arloa Koh;  Location: [facility];  Service: Gynecology;  Laterality: N/A;  with Ultrasound Guidance  . Knee arthroscopy w/ meniscal repair Right     --Dr. Telford Nab  . Appendectomy    . Endometrial biopsy  06/2013    FAMILY HISTORY The patient's father died from an MI in his late 81's. The patient's mother is alive at age 77.  She was diagnosed with breast cancer at age 9.  She is BRCA 1 and 2 negative.  The patient has two brothers, both in good shape. There is no other history of breast or ovarian cancer in the family.   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  She also carried her own child to term.  Last menstrual period was 2009; she had a D&C due to a thickened endometrial strip 2012; she has had some spotting last 2 months. [place], most recently Jan 2013 under Melinda Crutch, normal (per pt)  SOCIAL HISTORY: Simisola works at the [employer].  Her husband, Patrick Jupiter, present today, is a Customer service manager.  They have a son, Will, currently 73, who has been admitted to [facility] in [place], [place]..  The patient is not a church attender.     ADVANCED DIRECTIVES: not in place  HEALTH MAINTENANCE: History  Substance Use Topics  . Smoking status: Never Smoker   . Smokeless tobacco: Never Used  . Alcohol Use: 0.6 oz/week    1 Standard drinks or equivalent per week     Comment: occ glass of wine or beer      Colonoscopy: n/a  PAP: UTD  Bone density: 08/31/2011/ normal  Lipid panel:  Allergies  Allergen Reactions  . Latex     stuffiness  . Other Swelling    Dye #5    Current Outpatient Prescriptions  Medication Sig Dispense Refill  . albuterol (PROVENTIL HFA;VENTOLIN HFA) 108 (90 BASE) MCG/ACT inhaler Inhale 2 puffs into the lungs every 6 (six) hours as needed. Shortness of breath     . calcium carbonate (OS-CAL) 600 MG TABS tablet Take 600 mg by mouth 2 (two) times daily with a meal.    . cetirizine (ZYRTEC) 10 MG tablet Take 10 mg by mouth daily.      . folic acid (FOLVITE) 1 MG tablet Take 2 mg by mouth daily.    Marland Kitchen GLUCOSAMINE PO Take 1 tablet by mouth  daily.      . hydroxychloroquine (PLAQUENIL) 200 MG tablet Take 200 mg by mouth 2 (two) times daily.      . Magnesium 500 MG TABS Take 1 tablet by mouth 2 (two) times daily.    . Manganese 50 MG TABS Take 1 tablet by mouth daily.    . methotrexate (RHEUMATREX) 2.5 MG tablet Take 8-10 mg by mouth once a week. Caution:Chemotherapy. Protect from light.    . montelukast (SINGULAIR) 10 MG tablet Take 10 mg by mouth at bedtime.      . Triamcinolone Acetonide (NASACORT ALLERGY 24HR NA) Place into the nose.     No current facility-administered medications for this visit.    OBJECTIVE: Middle-aged white woman who appears younger than stated age 76 Vitals:   11/07/14 1003  BP: 112/62  Pulse: 57  Temp: 98.2 F (36.8 C)  Resp: 18     Body mass index is 26.26 kg/(m^2).    ECOG FS: 1  Sclerae unicteric, pupils equal and reactive Oropharynx clear and moist-- no thrush or other lesions  No cervical or supraclavicular adenopathy Lungs no rales or rhonchi Heart regular rate and rhythm Abd soft, nontender, positive bowel sounds MSK no focal spinal tenderness, no upper extremity lymphedema Neuro: nonfocal, well oriented, appropriate affect Breasts:  status post bilateral mastectomies. There is no evidence of local recurrence. Both  axillae are benign Skin: There is a slightly irregular scaly pink lesion in the mid upper chest, slightly above the level of the manubrium, which appears unchanged as compared to my prior description. There are minimal telangiectasias associated with this.   LAB RESULTS:  Lab Results  Component Value Date   WBC 3.8* 10/05/2013   NEUTROABS 2.1 10/05/2013   HGB 14.2 10/05/2013   HCT 41.3 10/05/2013   MCV 95.8 10/05/2013   PLT 190 10/05/2013      Chemistry      Component Value Date/Time   NA 143 10/05/2013 0911   NA 143 01/20/2012 0856   K 4.4 10/05/2013 0911   K 4.5 01/20/2012 0856   CL 104 12/26/2012 1213   CL 106 01/20/2012 0856   CO2 26 10/05/2013 0911   CO2 29 01/20/2012 0856   BUN 9.7 10/05/2013 0911   BUN 10 01/20/2012 0856   CREATININE 0.8 10/05/2013 0911   CREATININE 0.73 01/20/2012 0856      Component Value Date/Time   CALCIUM 9.5 10/05/2013 0911   CALCIUM 9.6 01/20/2012 0856   ALKPHOS 57 10/05/2013 0911   ALKPHOS 54 12/28/2011 0934   AST 19 10/05/2013 0911   AST 18 12/28/2011 0934   ALT 19 10/05/2013 0911   ALT 17 12/28/2011 0934   BILITOT 0.84 10/05/2013 0911   BILITOT 0.8 12/28/2011 0934       Lab Results  Component Value Date   LABCA2 23 07/02/2011    No components found for: [id number]  No results for input(s): INR in the last 168 hours.  Urinalysis    Component Value Date/Time   COLORURINE YELLOW 07/21/2011 [place] 07/21/2011 1344   LABSPEC >1.030* 07/21/2011 1344   PHURINE 6.0 07/21/2011 1344   GLUCOSEU NEGATIVE 07/21/2011 1344   HGBUR NEGATIVE 07/21/2011 1344   BILIRUBINUR n 08/01/2014 1357   [place] 07/21/2011 1344   KETONESUR 15* 07/21/2011 1344   PROTEINUR n 08/01/2014 1357   PROTEINUR NEGATIVE 07/21/2011 1344   UROBILINOGEN negative 08/01/2014 1357   UROBILINOGEN 0.2 07/21/2011 1344   NITRITE n 08/01/2014 1357  NITRITE NEGATIVE 07/21/2011 1344   LEUKOCYTESUR Negative 08/01/2014 1357     STUDIES:  No results found.  ASSESSMENT: 49 y.o.  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   (2) received adjuvant docetaxel/ cyclophosphamide x4, with trastuzumab to complete a year in October 2010.    (3) s/p bilateral mastectomies with implant placement November 2009   (4) on tamoxifen January 2010 -- January 2013, when she was switched to letrozole, discontinued in January of 2015.   (5) scoliosis and chronic low back pain, evaluated by MRI showing DDD  (6) multiple nonspecific pulmonary nodules noted January 2014 on CT of the chest-- repeat chest CT JAN 2015 showed no change (benign)  (7) osteopenia, with T score of -1.4 on bone density 09/28/2048  (8) postmenopausal bleeding, status post biopsy December 2015 with benign pathology   PLAN:  I spent approximately 45 minutes with winded today going over her situation. We reviewed the fact that she received tamoxifen for 2 years then anastrozole for 3. We still do not have category 1 data that tells as any additional antiestrogen therapy is superior to that 5 year treatment.  We reviewed results of the recent trials which showed that in women who receive chemotherapy for breast cancer and continue to menstruate making them postmenopausal either with bilateral salpingo-oophorectomy or with monthly goserelin shots improves survival. The benefits however were mostly seen in women below 40, and particularly in those 35 and under. There was little if any benefit in women above 45. Accordingly I don't think that data really applies to [place].  We also discussed the fact that 10 years of tamoxifen has been compared to 5 years of tamoxifen and we know that 10 years is better in that setting. However 10 years of tamoxifen has not been compared with 2 years of  tamoxifen +3 years on anastrozole.  Finally we reviewed the possible toxicities, side effects and complications of tamoxifen including endometrial stimulation, endometrial polyps, and the rare case of endometrial cancer, as well as of course of blood clots and other possible concerns. Given the fact that we do not have data that any further antiestrogen therapy would be helpful in terms of breast cancer prevention or treatment on the I would favor continuing observation.  After this discussion Saniya is comfortable with this plan. She does have that spot in her mid upper anterior chest wall, which I think warrants dermatologic follow-up and I am placing a referral to Dr. Allyson Sabal (who already sees her son) or one of his partners for possible biopsy.  I have not made a return appointment for Jocilyn here but of course I will be glad to see her at any point in nature if and when the need arises. MAGRINAT,GUSTAV C    11/07/2014

## 2014-11-07 NOTE — Telephone Encounter (Signed)
per pof to sch pt w/Dr Lipton-cld & spoke w/pt and gave appt time & date-pt understood

## 2014-11-26 ENCOUNTER — Telehealth: Payer: Self-pay | Admitting: Obstetrics and Gynecology

## 2014-11-26 NOTE — Telephone Encounter (Signed)
Spoke with patient. She states she has been having vaginal bleeding/spotting x 2 weeks (started 11/12/14). Patient states she was advised to call with any new bleeding. Has not had any bleeding since 08/2014 Hx Sonohysterogram/Endometrial biopsy 08/23/14.  Patient also states that she is having L sided intermittent ache that corresponds with bleeding that started two weeks ago. She is not using any medication to relieve pain. She also states that she has a new vaginal odor that corresponds with the start of this cycle as well. No vaginal discharge, no dysuria, no fevers. Advised patient recommended office visit if having new odor and ongoing vaginal bleeding. Patient initially wanted to monitor symptoms from home and call back with any changes, but is agreeable to office visit with Dr. Quincy Simmonds.  Scheduled for 11/29/14 at 1445.  Patient states she will call back with any increase in bleeding or changes to symptoms.  Advised would call back with any instructions from Dr. Quincy Simmonds prior to appointment. Patient agreeable.

## 2014-11-26 NOTE — Telephone Encounter (Signed)
Pt states Dr Quincy Simmonds seeing her for 'random periods'. States she started 2 weeks ago today and is still spotting. Usually 5-7 day cycle.

## 2014-11-26 NOTE — Telephone Encounter (Signed)
Please have patient keep appointment.  I agree with having her come in for an office visit.

## 2014-11-29 ENCOUNTER — Encounter: Payer: Self-pay | Admitting: Obstetrics and Gynecology

## 2014-11-29 ENCOUNTER — Ambulatory Visit (INDEPENDENT_AMBULATORY_CARE_PROVIDER_SITE_OTHER): Payer: BLUE CROSS/BLUE SHIELD | Admitting: Obstetrics and Gynecology

## 2014-11-29 VITALS — BP 120/64 | HR 60 | Ht 66.0 in | Wt 160.8 lb

## 2014-11-29 DIAGNOSIS — N760 Acute vaginitis: Secondary | ICD-10-CM

## 2014-11-29 DIAGNOSIS — N939 Abnormal uterine and vaginal bleeding, unspecified: Secondary | ICD-10-CM

## 2014-11-29 LAB — WET PREP BY MOLECULAR PROBE
CANDIDA SPECIES: NEGATIVE
GARDNERELLA VAGINALIS: NEGATIVE
TRICHOMONAS VAG: NEGATIVE

## 2014-11-29 NOTE — Progress Notes (Signed)
Patient ID: Rebecca Fleming, female   DOB: 1966/07/29, 49 y.o.   MRN: [id number] GYNECOLOGY VISIT  PCP:   None  Referring provider:   HPI: 49 y.o.   Married  Caucasian  female   G2P2 with Patient's last menstrual period was 06/28/2013.   here for postmenopausal bleeding.  Patient states began with vaginal bleeding 11-12-14 and is still bleeding slightly today.  She also complains of vaginal odor with this bleeding and LLQ discomfort. Noted some odor with the bleeding.   Prior bleeding was in October 2014.   Evaluation of bleeding performed on 08/23/14: Uterus with EMS 3.89 mm and small cystic area in endometrium. No myometrial masses.  Left ovary with follicular activity. Right ovary no follicular activity.  No free fluid.  Sonohysterogram - no filling defects.  Endometrial biopsy showed benign tissue/atrophy.   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  Patient is status post hysteroscopy with dilation and curettage for benign endometrial polyp in 2012.  GYNECOLOGIC HISTORY: Patient's last menstrual period was 06/28/2013. Sexually active: yes  Partner preference: female Contraception: postmenopausal   Menopausal hormone therapy: None DES exposure:  no  Blood transfusions:  Yes, 2004 following surgery.  Sexually transmitted diseases:   no GYN procedures and prior surgeries: D & C, C-section, bilateral masectomy(left breast CA) Last mammogram:  2009--hx bilateral mastectomy for left breast cancer               Last pap and high risk HPV testing: 07-21-13 wnl:neg HR HPV History of abnormal pap smear:  no   OB History    Gravida Para Term Preterm AB TAB SAB Ectopic Multiple Living   2 2       1 3        LIFESTYLE: Exercise:               Tobacco: no Alcohol:    1 drink per week Drug use:  no  Patient Active Problem List   Diagnosis Date Noted  . Breast cancer, left breast 11/07/2014  . Postmenopausal bleeding 07/13/2013  . Rheumatoid arthritis  01/11/2012  . Psoriasis 01/11/2012    Past Medical History  Diagnosis Date  . Arthritis   . Complication of anesthesia     pt states" gets shakes" with general  . RA (rheumatoid arthritis)   . Anemia   . Blood transfusion without reported diagnosis     greater than 10 yrs ago following surgery  . Cancer 2009    left breast--has bilateral mastectomy  . Fibroid   . Heart murmur   . History of breast cancer   . Scoliosis   . Abnormal uterine bleeding     Past Surgical History  Procedure Laterality Date  . Cesarean section  01/01/2004    Dr Quincy Simmonds  . Breast lumpectomy w/ needle localization  04/23/2008    w [facility] Dr Margot Chimes  . Breast lumpectomy  05/17/2008    re excise for margin - Dr Margot Chimes  . Portacath placement  05/17/2008    Dr Margot Chimes  . Mastectomy  09/11/2008    bilateral - Dr Margot Chimes  . Insertion of tissue expander after mastectomy  09/11/2008    Dr Harlow Mares  . Port-a-cath removal  09/09/2009    Dr Margot Chimes  . Umbilical hernia repair  09/09/2009    Dr Margot Chimes  . Hysteroscopy w/d&c  07/21/2011    Procedure: DILATATION AND CURETTAGE (D&C) /HYSTEROSCOPY;  Surgeon: Arloa Koh;  Location: [facility];  Service:  Gynecology;  Laterality: N/A;  with Ultrasound Guidance  . Knee arthroscopy w/ meniscal repair Right     --Dr. Telford Nab  . Appendectomy    . Endometrial biopsy  06/2013    Current Outpatient Prescriptions  Medication Sig Dispense Refill  . albuterol (PROVENTIL HFA;VENTOLIN HFA) 108 (90 BASE) MCG/ACT inhaler Inhale 2 puffs into the lungs every 6 (six) hours as needed. Shortness of breath     . calcium carbonate (OS-CAL) 600 MG TABS tablet Take 600 mg by mouth 2 (two) times daily with a meal.    . cetirizine (ZYRTEC) 10 MG tablet Take 10 mg by mouth daily.      . folic acid (FOLVITE) 1 MG tablet Take 2 mg by mouth daily.    Marland Kitchen GLUCOSAMINE PO Take 1 tablet by mouth daily.      . hydroxychloroquine (PLAQUENIL) 200 MG tablet Take 200 mg by mouth 2 (two) times daily.      .  Magnesium 500 MG TABS Take 1 tablet by mouth 2 (two) times daily.    . Manganese 50 MG TABS Take 1 tablet by mouth daily.    . methotrexate (RHEUMATREX) 2.5 MG tablet Take 8-10 mg by mouth once a week. Caution:Chemotherapy. Protect from light.    . Triamcinolone Acetonide (NASACORT ALLERGY 24HR NA) Place into the nose.     No current facility-administered medications for this visit.     ALLERGIES: Latex and Other  Family History  Problem Relation Age of Onset  . Breast cancer Mother   . Diabetes Mother   . Hypertension Mother   . Stroke Mother   . Heart failure Father   . Hypertension Father     History   Social History  . Marital Status: Married    Spouse Name: N/A  . Number of Children: N/A  . Years of Education: N/A   Occupational History  . Not on file.   Social History Main Topics  . Smoking status: Never Smoker   . Smokeless tobacco: Never Used  . Alcohol Use: 0.6 oz/week    1 Standard drinks or equivalent per week     Comment: occ glass of wine or beer  . Drug Use: No  . Sexual Activity:    Partners: Male    Birth Control/ Protection: None, Post-menopausal   Other Topics Concern  . Not on file   Social History Narrative    ROS:  Pertinent items are noted in HPI.  PHYSICAL EXAMINATION:    BP 120/64 mmHg  Pulse 60  Ht 5\' 6"  (1.676 m)  Wt 160 lb 12.8 oz (72.938 kg)  BMI 25.97 kg/m2  LMP 06/28/2013   Wt Readings from Last 3 Encounters:  11/29/14 160 lb 12.8 oz (72.938 kg)  11/07/14 162 lb 9.6 oz (73.755 kg)  08/01/14 163 lb 12.8 oz (74.299 kg)     Ht Readings from Last 3 Encounters:  11/29/14 5\' 6"  (1.676 m)  11/07/14 5\' 6"  (1.676 m)  08/23/14 5\' 6"  (1.676 m)    General appearance: alert, cooperative and appears stated age  Pelvic: External genitalia:  no lesions              Urethra:  normal appearing urethra with no masses, tenderness or lesions              Bartholins and Skenes: normal                 Vagina: normal appearing vagina  with normal color and discharge,  no lesions              Cervix: normal appearance.  Pink mucousy blood in the vagina.   Some decensus of cervix and uterus with tenaculum to anterior cervical lip.                 Bimanual Exam:  Uterus:  uterus is normal size, shape, consistency and nontender                                      Adnexa: normal adnexa in size, nontender and no masses  ASSESSMENT  Abnormal uterine bleeding.  History of breast cancer, estrogen and progesterone receptor positive  Status post bilateral mastectomy.  Status post Tamoxifen and Letrozole therapy.  Vaginitis.   PLAN  Affirm testing performed.  Will check FSH and estradiol. Discussion of potential laparoscopically assisted vaginal hysterectomy with bilateral salpingo-oophorectomy for recurrent abnormal uterine bleeding.   An After Visit Summary was printed and given to the patient.  25 minutes face to face time of which over 50% was spent in counseling.

## 2014-11-30 ENCOUNTER — Telehealth: Payer: Self-pay | Admitting: Obstetrics and Gynecology

## 2014-11-30 ENCOUNTER — Other Ambulatory Visit: Payer: Self-pay | Admitting: Obstetrics and Gynecology

## 2014-11-30 LAB — FOLLICLE STIMULATING HORMONE: FSH: 182.4 m[IU]/mL — AB

## 2014-11-30 LAB — ESTRADIOL: ESTRADIOL: 17.6 pg/mL

## 2014-11-30 MED ORDER — METRONIDAZOLE 0.75 % VA GEL: 1.0000 | Freq: Every day | VAGINAL | Status: DC

## 2014-11-30 NOTE — Telephone Encounter (Signed)
Patient has some questions for the nurse about scheduling a hysterectomy.

## 2014-11-30 NOTE — Telephone Encounter (Signed)
Routing to Sally Yeakley, RN for review and advise. 

## 2014-11-30 NOTE — Telephone Encounter (Signed)
Return call to patient. She is considering information from Dr Quincy Simmonds regarding surgery.  Her preference would be to wait until October 2016. She is not sure how soon Dr Quincy Simmonds recommends she proceed. Mid-June would be her second preference if recommended or even end of April. She just needs to know how to prioritize this around what she has planned.  Date options and scheduling policies discussed.  She is aware I will call her back next week after reviewing with Dr Quincy Simmonds.

## 2014-12-02 NOTE — Telephone Encounter (Signed)
I would proceed this spring if possible due to the nature of the bleeding being postmenopausal.  Patient did have a benign biopsy of endometrium in December for abnormal uterine bleeding.

## 2014-12-03 NOTE — Telephone Encounter (Signed)
Call to patient. Advised of Dr Elza Rafter recommendation to proceed earlier than later if possible due to PMB. Patient agreeable to proceeding with 01-15-15 date. Will proceed with scheduling and call patient back.  Case request sent to central scheduling. [facility]/ [facility].   Routing to provider for final review. Anything else needed or is it ok to close encounter?

## 2014-12-04 NOTE — Telephone Encounter (Signed)
Thank you for the update.  Encounter closed by me.

## 2014-12-06 ENCOUNTER — Telehealth: Payer: Self-pay | Admitting: Obstetrics and Gynecology

## 2014-12-06 NOTE — Telephone Encounter (Signed)
Left message for patient to call back. Need to go over surgery benefits. °

## 2014-12-07 NOTE — Telephone Encounter (Signed)
Call to patient. Patient not able to talk because she is with a customer. She will call back.

## 2014-12-07 NOTE — Telephone Encounter (Signed)
Pt returning sabrinas call. Requests call at work using [phone]. Press [phone] or [phone] to reach her.

## 2014-12-07 NOTE — Telephone Encounter (Signed)
Patient returned call. Advised of benefit quote received for surgeons portion of her surgery. Advised patient that she will receive separate communication from the hospital regarding facility charges/fees/payment.   Patient agreeable. Paid in full. Receipt mailed to patient

## 2014-12-11 ENCOUNTER — Telehealth: Payer: Self-pay | Admitting: *Deleted

## 2014-12-11 NOTE — Telephone Encounter (Signed)
Surgery scheduled for 01-15-15 at 0730 at [facility] as previously discussed. Call to patient to review surgery instructions. Left message to call back.

## 2014-12-11 NOTE — Telephone Encounter (Signed)
Call back to patient. Surgery instruction sheet reviewed and printed copy will be mailed to patient. (see scanned copy). Patient is on Methotrexate weekly, Plaquenil BID and several supplements for her RA. She requests I double check with Dr Quincy Simmonds regarding the need to discontinue any of these since they are for her RA. (calcium, magnesium, manganese, glucosamine). Which bowel prep will she need?

## 2014-12-11 NOTE — Telephone Encounter (Signed)
Returning call.

## 2014-12-12 NOTE — Telephone Encounter (Signed)
I would like to have recommendations from her rheumatologist about how much in advance the patient needs to stop the Methotrexate and Plaquenil prior to surgery, and when it is safe to resume.  Please contact the patient's rheumatology office.   I am ok with her taking her vitamins, but would recommend stopping the glucosamine 2 weeks in advance of surgery.

## 2014-12-17 NOTE — Telephone Encounter (Signed)
Call to patient. Notified of Dr Elza Rafter recommendations below. Patient states Dr Estanislado Pandy treats her for RA. Notified we will check reagarding Plaquenil and Methotrexate but should discontinue Glucosamine 2 weeks prior to surgery. Patient is agreeable.    Call to Dr Estanislado Pandy office, [phone], Left message with receptionist Mariann Laster, that I am calling to confirm medications prior to patient's surgery on 01-15-15.   Dr Estanislado Pandy is out of office this week and will call us back next week.

## 2014-12-18 NOTE — Telephone Encounter (Signed)
I would prefer to have patient off Plaquenil for 2 weeks prior to procedure.  Can cause thrombocytopenia, bronchospasm. She can start this back one week post op.

## 2014-12-18 NOTE — Telephone Encounter (Signed)
Call to Amy at Dr Arlean Hopping office. Advised message received regarding Methotrexate. Advised we are also calling to confirm instructions on Plaquenil as well. Per Amy, patient does not have to change schedule of Plaquenil unless surgeon prefers for patient to be off of it.  Please advise.

## 2014-12-18 NOTE — Telephone Encounter (Signed)
Rebecca Fleming from Dr. Arlean Hopping called and said, "Patient should stop methotrexate two weeks prior to surgery and can start again two weeks after surgery if she is doing well."

## 2014-12-25 NOTE — Telephone Encounter (Signed)
Patient notified to discontinue both Methotrexate and Plaquenil two weeks prior to surgery. Will see Dr Quincy Simmonds at one week post op and can discuss restarting at one week post op at the post op visit. Patient agreeable to instruction.  Encounter closed.

## 2014-12-27 ENCOUNTER — Encounter: Payer: Self-pay | Admitting: Obstetrics and Gynecology

## 2014-12-27 ENCOUNTER — Ambulatory Visit (INDEPENDENT_AMBULATORY_CARE_PROVIDER_SITE_OTHER): Payer: BLUE CROSS/BLUE SHIELD | Admitting: Obstetrics and Gynecology

## 2014-12-27 VITALS — BP 96/68 | HR 70 | Ht 66.0 in | Wt 159.8 lb

## 2014-12-27 DIAGNOSIS — N950 Postmenopausal bleeding: Secondary | ICD-10-CM | POA: Diagnosis not present

## 2014-12-27 NOTE — Progress Notes (Signed)
GYNECOLOGY  VISIT   HPI: 49 y.o.   Married  Caucasian  female   G2P2 with Patient's last menstrual period was 11/12/2014.   here for Surgery Consult.  Patient having recurrent postmenopausal bleeding - February 2016, December 2015, October 2014.  Evaluation of bleeding performed on 08/23/14: Uterus with EMS 3.89 mm and small cystic area in endometrium. No myometrial masses.  Left ovary with follicular activity. Right ovary no follicular activity.  No free fluid.  Sonohysterogram - no filling defects.  Endometrial biopsy showed benign tissue/atrophy.   FSH 182 and E2 17.6 on 11/29/14.  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 Status post bilateral mastectomy.   Status post hysteroscopy with dilation and curettage in 2012. History of umbilical hernia repair in 2010.  No known mesh usage. History of appendectomy.  History of Cesarean Section.   GYNECOLOGIC HISTORY: Patient's last menstrual period was 11/12/2014. Contraception:   Post Menopuasal Menopausal hormone therapy: N/A        OB History    Gravida Para Term Preterm AB TAB SAB Ectopic Multiple Living   2 2       1 3          Patient Active Problem List   Diagnosis Date Noted  . Breast cancer, left breast 11/07/2014  . Postmenopausal bleeding 07/13/2013  . Rheumatoid arthritis 01/11/2012  . Psoriasis 01/11/2012    Past Medical History  Diagnosis Date  . Arthritis   . Complication of anesthesia     pt states" gets shakes" with general  . RA (rheumatoid arthritis)   . Anemia   . Blood transfusion without reported diagnosis     greater than 10 yrs ago following surgery  . Cancer 2009    left breast--has bilateral mastectomy  . Fibroid   . Heart murmur   . History of breast cancer   . Scoliosis   . Abnormal uterine bleeding     Past Surgical History  Procedure Laterality Date  . Cesarean section  01/01/2004    Dr Quincy Simmonds  . Breast lumpectomy w/ needle localization  04/23/2008    w [facility]  Dr Margot Chimes  . Breast lumpectomy  05/17/2008    re excise for margin - Dr Margot Chimes  . Portacath placement  05/17/2008    Dr Margot Chimes  . Mastectomy  09/11/2008    bilateral - Dr Margot Chimes  . Insertion of tissue expander after mastectomy  09/11/2008    Dr Harlow Mares  . Port-a-cath removal  09/09/2009    Dr Margot Chimes  . Umbilical hernia repair  09/09/2009    Dr Margot Chimes  . Hysteroscopy w/d&c  07/21/2011    Procedure: DILATATION AND CURETTAGE (D&C) /HYSTEROSCOPY;  Surgeon: Arloa Koh;  Location: [facility];  Service: Gynecology;  Laterality: N/A;  with Ultrasound Guidance  . Knee arthroscopy w/ meniscal repair Right     --Dr. Telford Nab  . Appendectomy    . Endometrial biopsy  06/2013    Current Outpatient Prescriptions  Medication Sig Dispense Refill  . albuterol (PROVENTIL HFA;VENTOLIN HFA) 108 (90 BASE) MCG/ACT inhaler Inhale 2 puffs into the lungs every 6 (six) hours as needed. Shortness of breath     . calcium carbonate (OS-CAL) 600 MG TABS tablet Take 600 mg by mouth 2 (two) times daily with a meal.    . cetirizine (ZYRTEC) 10 MG tablet Take 10 mg by mouth daily.      . folic acid (FOLVITE) 1 MG tablet Take 2 mg by mouth daily.    Marland Kitchen  GLUCOSAMINE PO Take 1 tablet by mouth daily.      . hydroxychloroquine (PLAQUENIL) 200 MG tablet Take 200 mg by mouth 2 (two) times daily.      . Magnesium 500 MG TABS Take 1 tablet by mouth 2 (two) times daily.    . Manganese 50 MG TABS Take 1 tablet by mouth daily.    . methocarbamol (ROBAXIN) 500 MG tablet as needed.  1  . methotrexate (RHEUMATREX) 2.5 MG tablet Take 8-10 mg by mouth once a week. Caution:Chemotherapy. Protect from light.    . metroNIDAZOLE (METROGEL) 0.75 % vaginal gel Place 1 Applicatorful vaginally at bedtime. Use for 5 nights. 70 g 0  . Triamcinolone Acetonide (NASACORT ALLERGY 24HR NA) Place into the nose.     No current facility-administered medications for this visit.     ALLERGIES: Latex and Other  Family History  Problem Relation Age of  Onset  . Breast cancer Mother   . Diabetes Mother   . Hypertension Mother   . Stroke Mother   . Heart failure Father   . Hypertension Father     History   Social History  . Marital Status: Married    Spouse Name: N/A  . Number of Children: N/A  . Years of Education: N/A   Occupational History  . Not on file.   Social History Main Topics  . Smoking status: Never Smoker   . Smokeless tobacco: Never Used  . Alcohol Use: 0.6 oz/week    1 Standard drinks or equivalent per week     Comment: occ glass of wine or beer  . Drug Use: No  . Sexual Activity:    Partners: Male    Birth Control/ Protection: None, Post-menopausal   Other Topics Concern  . Not on file   Social History Narrative    ROS:  Pertinent items are noted in HPI.  PHYSICAL EXAMINATION:    BP 96/68 mmHg  Pulse 70  Ht 5\' 6"  (1.676 m)  Wt 159 lb 12.8 oz (72.485 kg)  BMI 25.80 kg/m2  LMP 11/12/2014     General appearance: alert, cooperative and appears stated age Lungs: clear to auscultation bilaterally Heart: regular rate and rhythm Abdomen: Umbilical incision and Pfannenstiel incision, soft, non-tender; no masses,  no organomegaly No abnormal inguinal nodes palpated  Pelvic: External genitalia:  no lesions              Urethra:  normal appearing urethra with no masses, tenderness or lesions              Bartholins and Skenes: normal                 Vagina: normal appearing vagina with normal color and discharge, no lesions              Cervix: normal appearance                   Bimanual Exam:  Uterus:  uterus is normal size, shape, consistency and nontender                                      Adnexa: normal adnexa in size, nontender and no masses  ASSESSMENT  Recurrent postmenopausal bleeding.  History of breast cancer.  Status post bilateral mastectomy.  Status post Cesarean Section x 1.  Status post umbilical herniorrhaphy.  Rheumatoid arthritis.    PLAN  Proceed with laparoscopically assisted vaginal hysterectomy with bilateral salpingo-oophorectomy. Risks, benefits, and alternatives discussed with the patient who wishes to proceed.  Risks include but are not limited to bleeding, infection, damage to surrounding organs, reaction to anesthesia, pneumonia, DVT, PE, death, hernia formation, and need for reoperation.  Surgical expectations and recovery discussed. Stop Methotrexate and Plaquinil 2 weeks pre-op.  Restart Methotrexate 2 weeks post op.  Can start Plaquinil probably at her 6 - 7 day post op visit.  Will get op report for herniorrhaphy to see if has mesh in umbilicus.   An After Visit Summary was printed and given to the patient.  __25____ minutes face to face time of which over 50% was spent in counseling.

## 2015-01-03 NOTE — Patient Instructions (Addendum)
   Your procedure is scheduled on: TUESDAY, APRIL 26  Enter through the Main Entrance of [facility] at: 6 AM Pick up the phone at the desk and dial [phone] and inform us of your arrival.  Please call this number if you have any problems the morning of surgery: [phone]  Remember: Do not eat or drink after midnight: Monday Take these medicines the morning of surgery with a SIP OF WATER:  Zyrtec. Bring albuterol inhaler with you on day of surgery.  Do not wear jewelry, make-up, or FINGER nail polish No metal in your hair or on your body. Do not wear lotions, powders, perfumes.  You may wear deodorant.  Do not bring valuables to the hospital. Contacts, dentures or bridgework may not be worn into surgery.  Leave suitcase in the car. After Surgery it may be brought to your room. For patients being admitted to the hospital, checkout time is 11:00am the day of discharge.  Home with husband Adell cell [phone]

## 2015-01-07 ENCOUNTER — Encounter (HOSPITAL_COMMUNITY)
Admission: RE | Admit: 2015-01-07 | Discharge: 2015-01-07 | Disposition: A | Discharge status: Home / Self Care | Payer: BLUE CROSS/BLUE SHIELD | Source: Ambulatory Visit | Attending: Obstetrics and Gynecology | Admitting: Obstetrics and Gynecology

## 2015-01-07 ENCOUNTER — Encounter (HOSPITAL_COMMUNITY): Payer: Self-pay

## 2015-01-07 DIAGNOSIS — Z01818 Encounter for other preprocedural examination: Secondary | ICD-10-CM | POA: Diagnosis not present

## 2015-01-07 HISTORY — DX: Unspecified asthma, uncomplicated: J45.909

## 2015-01-07 HISTORY — DX: Headache, unspecified: R51.9

## 2015-01-07 HISTORY — DX: Other allergic rhinitis: J30.89

## 2015-01-07 HISTORY — DX: Headache: R51

## 2015-01-07 LAB — COMPREHENSIVE METABOLIC PANEL
ALT: 20 U/L (ref 0–35)
ANION GAP: 5 (ref 5–15)
AST: 21 U/L (ref 0–37)
Albumin: 3.9 g/dL (ref 3.5–5.2)
Alkaline Phosphatase: 45 U/L (ref 39–117)
BUN: 13 mg/dL (ref 6–23)
CO2: 30 mmol/L (ref 19–32)
Calcium: 9.3 mg/dL (ref 8.4–10.5)
Chloride: 103 mmol/L (ref 96–112)
Creatinine, Ser: 0.73 mg/dL (ref 0.50–1.10)
GFR calc non Af Amer: >90 mL/min (ref >90)
GLUCOSE: 80 mg/dL (ref 70–99)
Potassium: 4.8 mmol/L (ref 3.5–5.1)
SODIUM: 138 mmol/L (ref 135–145)
TOTAL PROTEIN: 7 g/dL (ref 6.0–8.3)
Total Bilirubin: 0.8 mg/dL (ref 0.3–1.2)

## 2015-01-07 LAB — CBC
HCT: 40.9 % (ref 36.0–46.0)
Hemoglobin: 13.8 g/dL (ref 12.0–15.0)
MCH: 32.2 pg (ref 26.0–34.0)
MCHC: 33.7 g/dL (ref 30.0–36.0)
MCV: 95.3 fL (ref 78.0–100.0)
Platelets: 179 10*3/uL (ref 150–400)
RBC: 4.29 MIL/uL (ref 3.87–5.11)
RDW: 13.5 % (ref 11.5–15.5)
WBC: 4.7 10*3/uL (ref 4.0–10.5)

## 2015-01-13 NOTE — Anesthesia Preprocedure Evaluation (Addendum)
Anesthesia Evaluation  Patient identified by MRN, date of birth, ID band Patient awake    Reviewed: Allergy & Precautions, NPO status , Patient's Chart, lab work & pertinent test results, reviewed documented beta blocker date and time   History of Anesthesia Complications (+) history of anesthetic complications (shakes when emerging)  Airway Mallampati: II   Neck ROM: Full    Dental  (+) Teeth Intact, Dental Advisory Given   Pulmonary asthma ,  breath sounds clear to auscultation        Cardiovascular Rhythm:Regular  ECHO 2010 EF 55%   Neuro/Psych    GI/Hepatic negative GI ROS, Neg liver ROS,   Endo/Other  negative endocrine ROS  Renal/GU negative Renal ROS     Musculoskeletal  (+) Arthritis -,   Abdominal (+)  Abdomen: soft.    Peds  Hematology 13/40   Anesthesia Other Findings   Reproductive/Obstetrics                            Anesthesia Physical Anesthesia Plan  ASA: II  Anesthesia Plan: General   Post-op Pain Management:    Induction: Intravenous  Airway Management Planned: Oral ETT  Additional Equipment:   Intra-op Plan:   Post-operative Plan: Extubation in OR  Informed Consent: I have reviewed the patients History and Physical, chart, labs and discussed the procedure including the risks, benefits and alternatives for the proposed anesthesia with the patient or authorized representative who has indicated his/her understanding and acceptance.     Plan Discussed with:   Anesthesia Plan Comments:         Anesthesia Quick Evaluation

## 2015-01-14 MED ORDER — DEXTROSE 5 % IV SOLN
2.0000 g | INTRAVENOUS | Status: AC
  Administered 2015-01-15: 2 g via INTRAVENOUS
  Filled 2015-01-14: qty 2

## 2015-01-14 NOTE — H&P (Signed)
GYNECOLOGY  VISIT   HPI: 49 y.o.   Married  Caucasian  female    G2P2 with Patient's last menstrual period was 11/12/2014.    here for Surgery Consult.  Patient having recurrent postmenopausal bleeding - February 2016, December 2015, October 2014.  Evaluation of bleeding performed on 08/23/14: Uterus with EMS 3.89 mm and small cystic area in endometrium.  No myometrial masses.   Left ovary with follicular activity.  Right ovary no follicular activity.   No free fluid.   Sonohysterogram - no filling defects.   Endometrial biopsy showed benign tissue/atrophy.   FSH 182 and E2 17.6 on 11/29/14.  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 Status post bilateral mastectomy.   Status post hysteroscopy with dilation and curettage in 2012. History of umbilical hernia repair in 2010.  No known mesh usage. History of appendectomy.   History of Cesarean Section.   GYNECOLOGIC HISTORY: Patient's last menstrual period was 11/12/2014. Contraception:   Post Menopuasal Menopausal hormone therapy: N/A         OB History      Gravida  Para  Term  Preterm  AB  TAB  SAB  Ectopic  Multiple  Living     2  2              1  3              Patient Active Problem List     Diagnosis  Date Noted   .  Breast cancer, left breast  11/07/2014   .  Postmenopausal bleeding  07/13/2013   .  Rheumatoid arthritis  01/11/2012   .  Psoriasis  01/11/2012       Past Medical History   Diagnosis  Date   .  Arthritis     .  Complication of anesthesia         pt states" gets shakes" with general   .  RA (rheumatoid arthritis)     .  Anemia     .  Blood transfusion without reported diagnosis         greater than 10 yrs ago following surgery   .  Cancer  2009       left breast--has bilateral mastectomy   .  Fibroid     .  Heart murmur     .  History of breast cancer     .  Scoliosis     .  Abnormal uterine bleeding         Past Surgical History   Procedure  Laterality  Date   .   Cesarean section    01/01/2004       Dr Quincy Simmonds   .  Breast lumpectomy w/ needle localization    04/23/2008       w [facility] Dr Margot Chimes   .  Breast lumpectomy    05/17/2008       re excise for margin - Dr Margot Chimes   .  Portacath placement    05/17/2008       Dr Margot Chimes   .  Mastectomy    09/11/2008       bilateral - Dr Margot Chimes   .  Insertion of tissue expander after mastectomy    09/11/2008       Dr Harlow Mares   .  Port-a-cath removal    09/09/2009       Dr Margot Chimes   .  Umbilical hernia repair    09/09/2009  Dr Margot Chimes   .  Hysteroscopy w/d&c    07/21/2011       Procedure: DILATATION AND CURETTAGE (D&C) /HYSTEROSCOPY;  Surgeon: Arloa Koh;  Location: [facility];  Service: Gynecology;  Laterality: N/A;  with Ultrasound Guidance   .  Knee arthroscopy w/ meniscal repair  Right         --Dr. Telford Nab   .  Appendectomy       .  Endometrial biopsy    06/2013       Current Outpatient Prescriptions   Medication  Sig  Dispense  Refill   .  albuterol (PROVENTIL HFA;VENTOLIN HFA) 108 (90 BASE) MCG/ACT inhaler  Inhale 2 puffs into the lungs every 6 (six) hours as needed. Shortness of breath        .  calcium carbonate (OS-CAL) 600 MG TABS tablet  Take 600 mg by mouth 2 (two) times daily with a meal.       .  cetirizine (ZYRTEC) 10 MG tablet  Take 10 mg by mouth daily.         .  folic acid (FOLVITE) 1 MG tablet  Take 2 mg by mouth daily.       Marland Kitchen  GLUCOSAMINE PO  Take 1 tablet by mouth daily.         .  hydroxychloroquine (PLAQUENIL) 200 MG tablet  Take 200 mg by mouth 2 (two) times daily.         .  Magnesium 500 MG TABS  Take 1 tablet by mouth 2 (two) times daily.       .  Manganese 50 MG TABS  Take 1 tablet by mouth daily.       .  methocarbamol (ROBAXIN) 500 MG tablet  as needed.    1   .  methotrexate (RHEUMATREX) 2.5 MG tablet  Take 8-10 mg by mouth once a week. Caution:Chemotherapy. Protect from light.       .  metroNIDAZOLE (METROGEL) 0.75 % vaginal gel  Place 1 Applicatorful vaginally at bedtime. Use  for 5 nights.  70 g  0   .  Triamcinolone Acetonide (NASACORT ALLERGY 24HR NA)  Place into the nose.          No current facility-administered medications for this visit.      ALLERGIES: Latex and Other    Family History   Problem  Relation  Age of Onset   .  Breast cancer  Mother     .  Diabetes  Mother     .  Hypertension  Mother     .  Stroke  Mother     .  Heart failure  Father     .  Hypertension  Father         History      Social History   .  Marital Status:  Married       Spouse Name:  N/A   .  Number of Children:  N/A   .  Years of Education:  N/A      Occupational History   .  Not on file.      Social History Main Topics   .  Smoking status:  Never Smoker    .  Smokeless tobacco:  Never Used   .  Alcohol Use:  0.6 oz/week       1 Standard drinks or equivalent per week         Comment: occ glass of wine or beer   .  Drug Use:  No   .  Sexual Activity:       Partners:  Male       Birth Control/ Protection:  None, Post-menopausal      Other Topics  Concern   .  Not on file      Social History Narrative     ROS:  Pertinent items are noted in HPI.  PHYSICAL EXAMINATION:    BP 96/68 mmHg  Pulse 70  Ht 5\' 6"  (1.676 m)  Wt 159 lb 12.8 oz (72.485 kg)  BMI 25.80 kg/m2  LMP 11/12/2014     General appearance: alert, cooperative and appears stated age Lungs: clear to auscultation bilaterally Heart: regular rate and rhythm Abdomen: Umbilical incision and Pfannenstiel incision, soft, non-tender; no masses,  no organomegaly No abnormal inguinal nodes palpated  Pelvic: External genitalia:  no lesions              Urethra:  normal appearing urethra with no masses, tenderness or lesions              Bartholins and Skenes: normal                  Vagina: normal appearing vagina with normal color and discharge, no lesions              Cervix: normal appearance                    Bimanual Exam:  Uterus:  uterus is normal size, shape, consistency and  nontender                                      Adnexa: normal adnexa in size, nontender and no masses                                     ASSESSMENT  Recurrent postmenopausal bleeding.   History of breast cancer.  Status post bilateral mastectomy.   Status post Cesarean Section x 1.   Status post umbilical herniorrhaphy.   Rheumatoid arthritis.   PLAN  Proceed with laparoscopically assisted vaginal hysterectomy with bilateral salpingo-oophorectomy. Risks, benefits, and alternatives discussed with the patient who wishes to proceed.   Risks include but are not limited to bleeding, infection, damage to surrounding organs, reaction to anesthesia, pneumonia, DVT, PE, death, hernia formation, and need for reoperation.   Surgical expectations and recovery discussed. Stop Methotrexate and Plaquinil 2 weeks pre-op.  Restart Methotrexate 2 weeks post op.   Can start Plaquinil probably at her 6 - 7 day post op visit.   Will get op report for herniorrhaphy to see if has mesh in umbilicus.   An After Visit Summary was printed and given to the patient.  __25____ minutes face to face time of which over 50% was spent in counseling.

## 2015-01-15 ENCOUNTER — Encounter (HOSPITAL_COMMUNITY): Payer: Self-pay | Admitting: *Deleted

## 2015-01-15 ENCOUNTER — Ambulatory Visit (HOSPITAL_COMMUNITY): Payer: BLUE CROSS/BLUE SHIELD | Admitting: Anesthesiology

## 2015-01-15 ENCOUNTER — Encounter (HOSPITAL_COMMUNITY)
Admission: RE | Disposition: A | Discharge status: Home / Self Care | Payer: Self-pay | Source: Ambulatory Visit | Attending: Obstetrics and Gynecology

## 2015-01-15 ENCOUNTER — Observation Stay (HOSPITAL_COMMUNITY)
Admission: RE | Admit: 2015-01-15 | Discharge: 2015-01-16 | Disposition: A | Discharge status: Home / Self Care | Payer: BLUE CROSS/BLUE SHIELD | Source: Ambulatory Visit | Attending: Obstetrics and Gynecology | Admitting: Obstetrics and Gynecology

## 2015-01-15 DIAGNOSIS — Z79899 Other long term (current) drug therapy: Secondary | ICD-10-CM | POA: Diagnosis not present

## 2015-01-15 DIAGNOSIS — Z853 Personal history of malignant neoplasm of breast: Secondary | ICD-10-CM

## 2015-01-15 DIAGNOSIS — N809 Endometriosis, unspecified: Secondary | ICD-10-CM | POA: Diagnosis not present

## 2015-01-15 DIAGNOSIS — N800 Endometriosis of uterus: Secondary | ICD-10-CM | POA: Insufficient documentation

## 2015-01-15 DIAGNOSIS — M069 Rheumatoid arthritis, unspecified: Secondary | ICD-10-CM | POA: Diagnosis not present

## 2015-01-15 DIAGNOSIS — M1990 Unspecified osteoarthritis, unspecified site: Secondary | ICD-10-CM | POA: Diagnosis not present

## 2015-01-15 DIAGNOSIS — L409 Psoriasis, unspecified: Secondary | ICD-10-CM | POA: Insufficient documentation

## 2015-01-15 DIAGNOSIS — J45909 Unspecified asthma, uncomplicated: Secondary | ICD-10-CM | POA: Insufficient documentation

## 2015-01-15 DIAGNOSIS — D649 Anemia, unspecified: Secondary | ICD-10-CM | POA: Insufficient documentation

## 2015-01-15 DIAGNOSIS — N830 Follicular cyst of ovary: Secondary | ICD-10-CM | POA: Insufficient documentation

## 2015-01-15 DIAGNOSIS — N950 Postmenopausal bleeding: Secondary | ICD-10-CM | POA: Diagnosis not present

## 2015-01-15 DIAGNOSIS — D259 Leiomyoma of uterus, unspecified: Principal | ICD-10-CM | POA: Insufficient documentation

## 2015-01-15 DIAGNOSIS — Z90710 Acquired absence of both cervix and uterus: Secondary | ICD-10-CM | POA: Diagnosis present

## 2015-01-15 HISTORY — PX: LAPAROSCOPIC ASSISTED VAGINAL HYSTERECTOMY: SHX5398

## 2015-01-15 HISTORY — PX: SALPINGOOPHORECTOMY: SHX82

## 2015-01-15 SURGERY — HYSTERECTOMY, VAGINAL, LAPAROSCOPY-ASSISTED
Anesthesia: General | Site: Vagina

## 2015-01-15 MED ORDER — DIPHENHYDRAMINE HCL 12.5 MG/5ML PO ELIX: 12.5000 mg | ORAL_SOLUTION | Freq: Four times a day (QID) | ORAL | Status: DC | PRN

## 2015-01-15 MED ORDER — KETOROLAC TROMETHAMINE 30 MG/ML IJ SOLN
30.0000 mg | Freq: Four times a day (QID) | INTRAMUSCULAR | Status: DC
  Administered 2015-01-15 – 2015-01-16 (×4): 30 mg via INTRAVENOUS
  Filled 2015-01-15 (×3): qty 1

## 2015-01-15 MED ORDER — FENTANYL CITRATE (PF) 100 MCG/2ML IJ SOLN
INTRAMUSCULAR | Status: DC | PRN
  Administered 2015-01-15: 100 ug via INTRAVENOUS
  Administered 2015-01-15 (×3): 50 ug via INTRAVENOUS

## 2015-01-15 MED ORDER — KETOROLAC TROMETHAMINE 30 MG/ML IJ SOLN
INTRAMUSCULAR | Status: AC
  Filled 2015-01-15: qty 1

## 2015-01-15 MED ORDER — LIDOCAINE HCL (CARDIAC) 20 MG/ML IV SOLN
INTRAVENOUS | Status: DC | PRN
  Administered 2015-01-15: 100 mg via INTRAVENOUS

## 2015-01-15 MED ORDER — EPHEDRINE SULFATE 50 MG/ML IJ SOLN
INTRAMUSCULAR | Status: DC | PRN
  Administered 2015-01-15: 10 mg via INTRAVENOUS

## 2015-01-15 MED ORDER — LACTATED RINGERS IV SOLN
INTRAVENOUS | Status: DC
  Administered 2015-01-15 (×3): via INTRAVENOUS

## 2015-01-15 MED ORDER — LACTATED RINGERS IR SOLN
Status: DC | PRN
  Administered 2015-01-15: 3000 mL

## 2015-01-15 MED ORDER — NEOSTIGMINE METHYLSULFATE 10 MG/10ML IV SOLN
INTRAVENOUS | Status: AC
  Filled 2015-01-15: qty 1

## 2015-01-15 MED ORDER — MEPERIDINE HCL 25 MG/ML IJ SOLN: 6.2500 mg | INTRAMUSCULAR | Status: DC | PRN

## 2015-01-15 MED ORDER — SODIUM CHLORIDE 0.9 % IJ SOLN: 9.0000 mL | INTRAMUSCULAR | Status: DC | PRN

## 2015-01-15 MED ORDER — ONDANSETRON HCL 4 MG PO TABS
4.0000 mg | ORAL_TABLET | Freq: Four times a day (QID) | ORAL | Status: DC | PRN
Start: 2015-01-15 — End: 2015-01-16

## 2015-01-15 MED ORDER — PROPOFOL 10 MG/ML IV BOLUS
INTRAVENOUS | Status: AC
  Filled 2015-01-15: qty 20

## 2015-01-15 MED ORDER — OXYCODONE-ACETAMINOPHEN 5-325 MG PO TABS
1.0000 | ORAL_TABLET | ORAL | Status: DC | PRN
  Administered 2015-01-15: 2 via ORAL
  Administered 2015-01-16: 1 via ORAL
  Filled 2015-01-15: qty 2
  Filled 2015-01-15: qty 1

## 2015-01-15 MED ORDER — GLYCOPYRROLATE 0.2 MG/ML IJ SOLN
INTRAMUSCULAR | Status: DC | PRN
  Administered 2015-01-15: 0.4 mg via INTRAVENOUS
  Administered 2015-01-15: 0.2 mg via INTRAVENOUS

## 2015-01-15 MED ORDER — LIDOCAINE HCL (CARDIAC) 20 MG/ML IV SOLN
INTRAVENOUS | Status: AC
  Filled 2015-01-15: qty 5

## 2015-01-15 MED ORDER — MIDAZOLAM HCL 2 MG/2ML IJ SOLN
INTRAMUSCULAR | Status: AC
  Filled 2015-01-15: qty 2

## 2015-01-15 MED ORDER — NEOSTIGMINE METHYLSULFATE 10 MG/10ML IV SOLN
INTRAVENOUS | Status: DC | PRN
  Administered 2015-01-15: 2 mg via INTRAVENOUS

## 2015-01-15 MED ORDER — LACTATED RINGERS IV SOLN: INTRAVENOUS | Status: DC

## 2015-01-15 MED ORDER — EPHEDRINE 5 MG/ML INJ
INTRAVENOUS | Status: AC
  Filled 2015-01-15: qty 10

## 2015-01-15 MED ORDER — ONDANSETRON HCL 4 MG/2ML IJ SOLN: 4.0000 mg | Freq: Four times a day (QID) | INTRAMUSCULAR | Status: DC | PRN

## 2015-01-15 MED ORDER — ESTRADIOL 0.1 MG/GM VA CREA
TOPICAL_CREAM | VAGINAL | Status: AC
  Filled 2015-01-15: qty 42.5

## 2015-01-15 MED ORDER — BUPIVACAINE HCL (PF) 0.25 % IJ SOLN
INTRAMUSCULAR | Status: DC | PRN
  Administered 2015-01-15: 6 mL

## 2015-01-15 MED ORDER — NALOXONE HCL 0.4 MG/ML IJ SOLN
0.4000 mg | INTRAMUSCULAR | Status: DC | PRN
Start: 2015-01-15 — End: 2015-01-15

## 2015-01-15 MED ORDER — ROCURONIUM BROMIDE 100 MG/10ML IV SOLN
INTRAVENOUS | Status: DC | PRN
  Administered 2015-01-15: 40 mg via INTRAVENOUS

## 2015-01-15 MED ORDER — FENTANYL CITRATE (PF) 100 MCG/2ML IJ SOLN
INTRAMUSCULAR | Status: AC
  Filled 2015-01-15: qty 2

## 2015-01-15 MED ORDER — DEXAMETHASONE SODIUM PHOSPHATE 4 MG/ML IJ SOLN
INTRAMUSCULAR | Status: AC
  Filled 2015-01-15: qty 1

## 2015-01-15 MED ORDER — MIDAZOLAM HCL 2 MG/2ML IJ SOLN
INTRAMUSCULAR | Status: DC | PRN
  Administered 2015-01-15: 2 mg via INTRAVENOUS

## 2015-01-15 MED ORDER — SCOPOLAMINE 1 MG/3DAYS TD PT72
1.0000 | MEDICATED_PATCH | Freq: Once | TRANSDERMAL | Status: DC
  Administered 2015-01-15: 1.5 mg via TRANSDERMAL

## 2015-01-15 MED ORDER — IBUPROFEN 600 MG PO TABS: 600.0000 mg | ORAL_TABLET | Freq: Four times a day (QID) | ORAL | Status: DC | PRN

## 2015-01-15 MED ORDER — ONDANSETRON HCL 4 MG/2ML IJ SOLN
INTRAMUSCULAR | Status: AC
  Filled 2015-01-15: qty 2

## 2015-01-15 MED ORDER — BUPIVACAINE HCL (PF) 0.25 % IJ SOLN
INTRAMUSCULAR | Status: AC
  Filled 2015-01-15: qty 30

## 2015-01-15 MED ORDER — LIDOCAINE-EPINEPHRINE 1 %-1:100000 IJ SOLN
INTRAMUSCULAR | Status: DC | PRN
  Administered 2015-01-15: 10 mL

## 2015-01-15 MED ORDER — MENTHOL 3 MG MT LOZG: 1.0000 | LOZENGE | OROMUCOSAL | Status: DC | PRN

## 2015-01-15 MED ORDER — PROPOFOL 10 MG/ML IV BOLUS
INTRAVENOUS | Status: DC | PRN
  Administered 2015-01-15: 150 mg via INTRAVENOUS

## 2015-01-15 MED ORDER — SCOPOLAMINE 1 MG/3DAYS TD PT72
MEDICATED_PATCH | TRANSDERMAL | Status: AC
  Administered 2015-01-15: 1.5 mg via TRANSDERMAL
  Filled 2015-01-15: qty 1

## 2015-01-15 MED ORDER — DIPHENHYDRAMINE HCL 50 MG/ML IJ SOLN: 12.5000 mg | Freq: Four times a day (QID) | INTRAMUSCULAR | Status: DC | PRN

## 2015-01-15 MED ORDER — MORPHINE SULFATE (PF) 1 MG/ML IV SOLN
INTRAVENOUS | Status: DC
  Administered 2015-01-15: 12:00:00 via INTRAVENOUS
  Administered 2015-01-15: 6 mg via INTRAVENOUS
  Administered 2015-01-15: 5 mg via INTRAVENOUS
  Administered 2015-01-15: 3 mg via INTRAVENOUS
  Filled 2015-01-15: qty 25

## 2015-01-15 MED ORDER — ONDANSETRON HCL 4 MG/2ML IJ SOLN
INTRAMUSCULAR | Status: DC | PRN
  Administered 2015-01-15: 4 mg via INTRAVENOUS

## 2015-01-15 MED ORDER — GLYCOPYRROLATE 0.2 MG/ML IJ SOLN
INTRAMUSCULAR | Status: AC
  Filled 2015-01-15: qty 3

## 2015-01-15 MED ORDER — ACETAMINOPHEN 10 MG/ML IV SOLN
1000.0000 mg | Freq: Once | INTRAVENOUS | Status: AC
  Administered 2015-01-15: 1000 mg via INTRAVENOUS
  Filled 2015-01-15: qty 100

## 2015-01-15 MED ORDER — DEXAMETHASONE SODIUM PHOSPHATE 10 MG/ML IJ SOLN
INTRAMUSCULAR | Status: DC | PRN
  Administered 2015-01-15: 4 mg via INTRAVENOUS

## 2015-01-15 MED ORDER — ROCURONIUM BROMIDE 100 MG/10ML IV SOLN
INTRAVENOUS | Status: AC
  Filled 2015-01-15: qty 1

## 2015-01-15 MED ORDER — FENTANYL CITRATE (PF) 100 MCG/2ML IJ SOLN
25.0000 ug | INTRAMUSCULAR | Status: DC | PRN
  Administered 2015-01-15 (×2): 50 ug via INTRAVENOUS

## 2015-01-15 MED ORDER — FENTANYL CITRATE (PF) 250 MCG/5ML IJ SOLN
INTRAMUSCULAR | Status: AC
  Filled 2015-01-15: qty 5

## 2015-01-15 MED ORDER — PNEUMOCOCCAL VAC POLYVALENT 25 MCG/0.5ML IJ INJ
0.5000 mL | INJECTION | INTRAMUSCULAR | Status: AC
  Administered 2015-01-16: 0.5 mL via INTRAMUSCULAR
  Filled 2015-01-15: qty 0.5

## 2015-01-15 MED ORDER — LIDOCAINE-EPINEPHRINE 1 %-1:100000 IJ SOLN
INTRAMUSCULAR | Status: AC
  Filled 2015-01-15: qty 1

## 2015-01-15 MED ORDER — LACTATED RINGERS IV SOLN
INTRAVENOUS | Status: DC
  Administered 2015-01-15 – 2015-01-16 (×2): via INTRAVENOUS

## 2015-01-15 MED ORDER — ESTRADIOL 0.1 MG/GM VA CREA: TOPICAL_CREAM | VAGINAL | Status: DC | PRN

## 2015-01-15 MED ORDER — PROMETHAZINE HCL 25 MG/ML IJ SOLN: 6.2500 mg | INTRAMUSCULAR | Status: DC | PRN

## 2015-01-15 SURGICAL SUPPLY — 44 items
CABLE HIGH FREQUENCY MONO STRZ (ELECTRODE) ×1 IMPLANT
CATH ROBINSON RED A/P 16FR (CATHETERS) IMPLANT
CLOTH BEACON ORANGE TIMEOUT ST (SAFETY) ×3 IMPLANT
CONT PATH 16OZ SNAP LID 3702 (MISCELLANEOUS) ×3 IMPLANT
COVER BACK TABLE 60X90IN (DRAPES) ×3 IMPLANT
DECANTER SPIKE VIAL GLASS SM (MISCELLANEOUS) ×1 IMPLANT
DRSG COVADERM PLUS 2X2 (GAUZE/BANDAGES/DRESSINGS) ×6 IMPLANT
DRSG OPSITE POSTOP 3X4 (GAUZE/BANDAGES/DRESSINGS) ×1 IMPLANT
DURAPREP 26ML APPLICATOR (WOUND CARE) ×3 IMPLANT
ELECT REM PT RETURN 9FT ADLT (ELECTROSURGICAL) ×3
ELECTRODE REM PT RTRN 9FT ADLT (ELECTROSURGICAL) IMPLANT
FILTER SMOKE EVAC LAPAROSHD (FILTER) ×3 IMPLANT
FORCEPS CUTTING 33CM 5MM (CUTTING FORCEPS) IMPLANT
GLOVE BIO SURGEON STRL SZ 6.5 (GLOVE) ×6 IMPLANT
GLOVE BIOGEL PI IND STRL 7.0 (GLOVE) ×4 IMPLANT
GLOVE BIOGEL PI INDICATOR 7.0 (GLOVE) ×2
LIQUID BAND (GAUZE/BANDAGES/DRESSINGS) ×1 IMPLANT
NS IRRIG 1000ML POUR BTL (IV SOLUTION) ×3 IMPLANT
PACK LAVH (CUSTOM PROCEDURE TRAY) ×3 IMPLANT
PACK ROBOTIC GOWN (GOWN DISPOSABLE) ×3 IMPLANT
PAD MAGNETIC INST (MISCELLANEOUS) ×3 IMPLANT
PAD POSITIONER PINK NONSTERILE (MISCELLANEOUS) ×3 IMPLANT
PROTECTOR NERVE ULNAR (MISCELLANEOUS) ×3 IMPLANT
SCISSORS LAP 5X35 DISP (ENDOMECHANICALS) ×1 IMPLANT
SET IRRIG TUBING LAPAROSCOPIC (IRRIGATION / IRRIGATOR) IMPLANT
SOLUTION ELECTROLUBE (MISCELLANEOUS) ×1 IMPLANT
STRIP CLOSURE SKIN 1/4X3 (GAUZE/BANDAGES/DRESSINGS) IMPLANT
SUT VIC AB 0 CT1 18XCR BRD8 (SUTURE) ×6 IMPLANT
SUT VIC AB 0 CT1 27 (SUTURE) ×6
SUT VIC AB 0 CT1 27XBRD ANBCTR (SUTURE) ×4 IMPLANT
SUT VIC AB 0 CT1 36 (SUTURE) ×3 IMPLANT
SUT VIC AB 0 CT1 8-18 (SUTURE) ×9
SUT VIC AB 0 CT2 27 (SUTURE) IMPLANT
SUT VICRYL 0 TIES 12 18 (SUTURE) ×3 IMPLANT
SUT VICRYL 0 UR6 27IN ABS (SUTURE) ×1 IMPLANT
SUT VICRYL 4-0 PS2 18IN ABS (SUTURE) ×3 IMPLANT
TIP UTERINE 5.1X6CM LAV DISP (MISCELLANEOUS) IMPLANT
TIP UTERINE 6.7X10CM GRN DISP (MISCELLANEOUS) IMPLANT
TIP UTERINE 6.7X6CM WHT DISP (MISCELLANEOUS) IMPLANT
TIP UTERINE 6.7X8CM BLUE DISP (MISCELLANEOUS) IMPLANT
TOWEL OR 17X24 6PK STRL BLUE (TOWEL DISPOSABLE) ×6 IMPLANT
TRAY FOLEY CATH SILVER 16FR (SET/KITS/TRAYS/PACK) ×3 IMPLANT
WARMER LAPAROSCOPE (MISCELLANEOUS) ×3 IMPLANT
WATER STERILE IRR 1000ML POUR (IV SOLUTION) ×3 IMPLANT

## 2015-01-15 NOTE — Progress Notes (Signed)
Ur chart review completed.  

## 2015-01-15 NOTE — Anesthesia Postprocedure Evaluation (Signed)
  Anesthesia Post-op Note  Patient: Rebecca Fleming  Procedure(s) Performed: Procedure(s): LAPAROSCOPIC ASSISTED VAGINAL HYSTERECTOMY (N/A) BILATERAL SALPINGO OOPHORECTOMY (Bilateral)  Patient Location: PACU  Anesthesia Type:General  Level of Consciousness: awake, alert  and oriented  Airway and Oxygen Therapy: Patient Spontanous Breathing and Patient connected to nasal cannula oxygen  Post-op Pain: mild  Post-op Assessment: Post-op Vital signs reviewed, Patient's Cardiovascular Status Stable, Respiratory Function Stable and Patent Airway  Post-op Vital Signs: Reviewed and stable  Last Vitals:  Filed Vitals:   01/15/15 1015  BP:   Pulse:   Temp: 36.7 C  Resp:     Complications: No apparent anesthesia complications

## 2015-01-15 NOTE — Transfer of Care (Signed)
Immediate Anesthesia Transfer of Care Note  Patient: Rebecca Fleming  Procedure(s) Performed: Procedure(s): LAPAROSCOPIC ASSISTED VAGINAL HYSTERECTOMY (N/A) BILATERAL SALPINGO OOPHORECTOMY (Bilateral)  Patient Location: PACU  Anesthesia Type:General  Level of Consciousness: awake, alert  and oriented  Airway & Oxygen Therapy: Patient Spontanous Breathing and Patient connected to nasal cannula oxygen  Post-op Assessment: Report given to RN, Post -op Vital signs reviewed and stable and Patient moving all extremities  Post vital signs: Reviewed and stable  Last Vitals:  Filed Vitals:   01/15/15 0602  BP: 106/73  Pulse: 56  Temp: 36.5 C  Resp: 20    Complications: No apparent anesthesia complications

## 2015-01-15 NOTE — Brief Op Note (Signed)
01/15/2015  10:03 AM  PATIENT:  Rebecca Fleming  49 y.o. female  PRE-OPERATIVE DIAGNOSIS:  POST MENOPAUSAL BLEEDING  POST-OPERATIVE DIAGNOSIS:  post menopausal bleeding  PROCEDURE:  Procedure(s): LAPAROSCOPIC ASSISTED VAGINAL HYSTERECTOMY (N/A) BILATERAL SALPINGO OOPHORECTOMY (Bilateral)  SURGEON:  Surgeon(s) and Role:    * Brook E Yisroel Ramming, MD - Primary    * Megan Salon, MD - Assisting  PHYSICIAN ASSISTANT: NA  ASSISTANTS:  Lyman Speller, MD   ANESTHESIA:   local, general .  EBL:  Total I/O In: 2000 [I.V.:2000] Out: 200 [Urine:100; Blood:100]  BLOOD ADMINISTERED:none  DRAINS: Urinary Catheter (Foley)   LOCAL MEDICATIONS USED:  MARCAINE    and LIDOCAINE   SPECIMEN:  Source of Specimen:  Uterus, cervix, bilateral tubes and ovaries.   DISPOSITION OF SPECIMEN:  PATHOLOGY  COUNTS:  YES  TOURNIQUET:  * No tourniquets in log *  DICTATION: .Other Dictation: Dictation Number    PLAN OF CARE: Admit for overnight observation  PATIENT DISPOSITION:  PACU - hemodynamically stable.   Delay start of Pharmacological VTE agent (>24hrs) due to surgical blood loss or risk of bleeding: not applicable

## 2015-01-15 NOTE — Anesthesia Procedure Notes (Signed)
Procedure Name: Intubation Date/Time: 01/15/2015 7:30 AM Performed by: Hewitt Blade Pre-anesthesia Checklist: Patient identified, Emergency Drugs available, Suction available and Patient being monitored Patient Re-evaluated:Patient Re-evaluated prior to inductionOxygen Delivery Method: Circle system utilized Preoxygenation: Pre-oxygenation with 100% oxygen Intubation Type: IV induction Ventilation: Mask ventilation without difficulty Laryngoscope Size: Mac and 3 Grade View: Grade II Tube type: Oral Tube size: 7.0 mm Number of attempts: 1 Airway Equipment and Method: Stylet Placement Confirmation: ETT inserted through vocal cords under direct vision,  positive ETCO2 and breath sounds checked- equal and bilateral Secured at: 20 cm Tube secured with: Tape Dental Injury: Teeth and Oropharynx as per pre-operative assessment

## 2015-01-15 NOTE — Op Note (Signed)
Rebecca Fleming, Rebecca Fleming                 ACCOUNT NO.:  [id number]  MEDICAL RECORD NO.:  [id number]  LOCATION:  5188                          FACILITY:  [facility]  PHYSICIAN:  Lenard Galloway, M.D.   DATE OF BIRTH:  [birth year]  DATE OF PROCEDURE:  01/15/2015 DATE OF DISCHARGE:                              OPERATIVE REPORT   PREOPERATIVE DIAGNOSIS:  Recurrent postmenopausal bleeding.  POSTOPERATIVE DIAGNOSIS:  Recurrent postmenopausal bleeding.  PROCEDURE:  Laparoscopically assisted vaginal hysterectomy with bilateral salpingo-oophorectomy, lysis of adhesions.  SURGEON:  Lenard Galloway, M.D.  ASSISTANT:  Lyman Speller, M.D.  ANESTHESIA:  General endotracheal, local with 0.25% Marcaine to the skin and 1% lidocaine with epinephrine 1:100,000 to the cervix.  IV FLUIDS:  2000 mL Ringer's lactate.  ESTIMATED BLOOD LOSS:  100 mL.  URINE OUTPUT:  100 mL.  COMPLICATIONS:  None.  INDICATIONS FOR THE PROCEDURE:  The patient is a 49 year old gravida 2, para 2, Caucasian female who presents with recurrent postmenopausal bleeding.  The patient has had evaluation of the bleeding performed on August 23, 2014, which documented a uterus with an endometrial stripe of 3.89 mm and a cystic area within the endometrium.  The patient had no myometrial masses.  The left ovary documented some follicular activity at that time.  There was no right follicular ovarian activity.  There was no free fluid.  Sonohysterogram documented no filling defects and an endometrial biopsy showed benign tissue and atrophy.  The patient had a prior [facility] in November 2015, which was 74.4.  The patient presented again with postmenopausal bleeding in the month of February and was seen in the month of March and at that time, her [facility] level was 182 and her estradiol level was 17.6.  The patient has a history of breast cancer which is estrogen and progesterone receptor positive.  She is off tamoxifen and letrozole. The plan is  now made to proceed with a laparoscopically assisted vaginal hysterectomy with bilateral salpingo-oophorectomy.  Risks, benefits, and alternatives have been reviewed with the patient who wishes to proceed.  FINDINGS:  Laparoscopy demonstrated the uterus to be slightly enlarged and boggy.  The bilateral ovaries and fallopian tubes were unremarkable. There were some small adhesions between the right fallopian tube and the right pelvic sidewall.  There were also some adhesions between omentum and the right anterior abdominal wall above the site of the patient's prior appendectomy.  In the upper abdomen, the liver was unremarkable.  SPECIMENS:  The uterus with cervix, bilateral tubes and ovaries were sent to Pathology together.  DESCRIPTION OF PROCEDURE:  The patient was reidentified in the preoperative hold area.  She received cefotetan 2 g IV for antibiotic prophylaxis and she received TED hose and PAS stockings for DVT prophylaxis.  In the operating room, the patient was placed in the dorsal lithotomy position with her arms at her sides.  General endotracheal anesthesia was induced.  The vagina and abdomen were then sterilely prepped and she was draped.  A Foley catheter was placed inside the bladder and a speculum was placed inside the vagina.  A single-tooth tenaculum was placed on the anterior cervical lip and this was replaced  with a Hulka tenaculum.  The procedure began by making a small supraumbilical incision with a scalpel after the skin was injected locally with 0.25% Marcaine.  A Veress needle was introduced into the peritoneal cavity without difficulty.  The saline drop test was performed and the fluid flowed freely.  The CO2 pneumoperitoneum was achieved.  The supraumbilical incision was then enlarged to accommodate a 10 mm trocar.  The trocar was inserted into the peritoneal cavity without difficulty.  The laparoscope confirmed proper placement.  The patient was placed  in Trendelenburg position.  The right and left lower quadrants were injected locally with 0.25% Marcaine and 5 mm trocars were then placed under direct visualization of the laparoscope. An inspection of the pelvic and abdominal organs was performed and the findings are as noted above.  The procedure began by identifying the ureters along each of the pelvic sidewalls.  The small omental adhesions to the right anterior abdominal wall were lysed with monopolar scissors.  The left infundibulopelvic ligament was identified at this time and was triply cauterized and then cut with the gyrus instrument.  Dissection through the broad ligament continued with the gyrus instrument for cautery and cutting.  The left round ligament was then similarly cauterized and bisected with the gyrus.  Dissection through the anterior leaves of the broad ligament was performed, partially with the gyrus and partially with laparoscopic scissors.  The same procedure that was performed on the patient's left-hand side was repeated on the right-hand side again after the ureter was identified.  The small adhesions between the fallopian tube and the right pelvic sidewall were lysed sharply.  The bladder flap was partially taken down using the laparoscopic monopolar scissors.  At this time, the remainder of the procedure was performed vaginally except for the final laparoscopy at the termination of the procedure.  The CO2 pneumoperitoneum was released.  The laparoscopic trocars remained in place.  The patient was placed in the high lithotomy position.  A weighted speculum was placed inside the vagina and Ardis Hughs tenaculums were placed on the anterior and posterior cervical lips.  The cervix was circumferentially injected with 1% lidocaine with epinephrine 1:100,000. The cervix was circumscribed with a scalpel.  The posterior cul-de-sac was entered sharply with Mayo scissors.  The long weighted speculum was placed  in the posterior cul-de-sac.  Mayo scissors were used to dissect through some of the endopelvic fascia on the cervix.  Each of the uterosacral ligaments were then clamped, sharply divided, and suture ligated with transfixing sutures of 0 Vicryl.  The bladder pillars were similarly clamped, sharply divided, and suture ligated with 0 Vicryl. Dissection of the bladder off the cervix and lower uterine segment was performed using sharp dissection.  Continued clamping of the inferior aspects of the cardinal ligaments was performed with the Heaney clamps. These pedicles were sharply divided and were suture ligated with 0 Vicryl.  Eventually, entry into the anterior cul-de-sac was performed sharply with Metzenbaum scissors.  The Deaver was placed in the anterior cul-de- sac.  The remainder of the cardinal ligaments were then each bilaterally clamped, sharply divided, and suture ligated with 0 Vicryl bilaterally. Eventually, there was just a small portion of the broad ligament which remained on both sides.  These were clamped, sharply divided, and then free tied with 0 Vicryl.  The specimen was sent to Pathology.  The pedicles were reexamined.  There was some bleeding noted along the vaginal cuff of the patient's left-hand side between the uterosacral ligament  and the uterine artery pedicle.  The vaginal mucosa was cauterized with monopolar cautery.  A right angle was then used to clamp the pedicle between the uterosacral and the uterine artery pedicle on that same side and a free tie of 0 Vicryl was placed.  Hemostasis was then good.  The McCall culdoplasty suture was performed at this time using 0 Vicryl. The suture came through the vagina at the 6 o'clock position and into the posterior cul-de-sac.  It was brought through the distal left uterosacral ligament, across the posterior cul-de-sac in a pursestring fashion, down through the distal right uterosacral ligament, into the posterior  cul-de-sac, and then out the vagina at the 6 o'clock position.  This suture was held and it was tied after closure of the vaginal cuff and there was good elevation of the vaginal apex.  The peritoneum was grasped anteriorly at this time.  The vagina was closed with a running locked suture of 0 Vicryl.  The culdoplasty suture was tied.  The vagina was irrigated and suctioned and found to be hemostatic.  A gauze packing was placed inside the vagina.  The patient was taken out of the high lithotomy position.  A final pneumoperitoneum was achieved and the pelvis was irrigated and suctioned.  There was a small amount of bleeding noted along the peritoneum of the broad ligament posteriorly and this responded to cautery with the gyrus instrument.  The pneumoperitoneum was released and all pedicle sites were examined and there was good hemostasis.  The ureters were noted to be peristalsing bilaterally.  The lower abdominal trocars were removed under visualization of the laparoscope.  The CO2 pneumoperitoneum was released.  Manual breaths were given and the remaining pneumoperitoneum was released.  The umbilical trocar was removed.  The fascia was closed with a single suture of 0 Vicryl which closed the fascia well.  All skin incisions were closed with subcuticular sutures of 4-0 Vicryl.  Dermabond was placed over the incisions and the supraumbilical incision was closed with a honeycomb dressing.  This concluded the patient's procedure.  There were no complications. All needle, instrument, and sponge counts were correct.  The patient was escorted to the recovery room in stable and awake condition.     Lenard Galloway, M.D.     BES/MEDQ  D:  01/15/2015  T:  01/15/2015  Job:  [id number]

## 2015-01-15 NOTE — Progress Notes (Signed)
Update Note  A little sore throat today. No fever.   OP report from general surgeon received regarding umbilical herniorrhaphy.  Has permanent mesh and Prolene suture in umbilicus.   Patient examined.   OK to proceed with surgery.

## 2015-01-15 NOTE — Progress Notes (Signed)
Day of Surgery Procedure(s) (LRB): LAPAROSCOPIC ASSISTED VAGINAL HYSTERECTOMY (N/A) BILATERAL SALPINGO OOPHORECTOMY (Bilateral)  Subjective: Patient reports tolerating PO.   Tolerating clear liquids. Ambulating well.  No vaginal bleeding  Objective: I have reviewed patient's vital signs and intake and output.  T 98.2,  BP 100/55, P 72, RR 11 General: alert and cooperative Resp: clear to auscultation bilaterally Cardio: regular rate and rhythm, S1, S2 normal, no murmur, click, rub or gallop GI: soft, non-tender; bowel sounds normal; no masses,  no organomegaly and incision: clean, dry and intact Extremities:  PAS and Ted hose on. .  Vaginal Bleeding: none  Assessment: s/p Procedure(s): LAPAROSCOPIC ASSISTED VAGINAL HYSTERECTOMY (N/A) BILATERAL SALPINGO OOPHORECTOMY (Bilateral): stable  Plan: Advance diet Encourage ambulation Continue foley due to post op state.   CBC and BMP.  Discussed surgical findings and procedure.       Gilman Schmidt, Brook E 01/15/2015, 5:16 PM

## 2015-01-16 ENCOUNTER — Encounter (HOSPITAL_COMMUNITY): Payer: Self-pay | Admitting: Obstetrics and Gynecology

## 2015-01-16 DIAGNOSIS — D259 Leiomyoma of uterus, unspecified: Secondary | ICD-10-CM | POA: Diagnosis not present

## 2015-01-16 LAB — BASIC METABOLIC PANEL
Anion gap: 5 (ref 5–15)
BUN: 7 mg/dL (ref 6–23)
CO2: 30 mmol/L (ref 19–32)
Calcium: 8.4 mg/dL (ref 8.4–10.5)
Chloride: 105 mmol/L (ref 96–112)
Creatinine, Ser: 0.7 mg/dL (ref 0.50–1.10)
GFR calc Af Amer: >90 mL/min (ref >90)
GLUCOSE: 100 mg/dL — AB (ref 70–99)
POTASSIUM: 4.1 mmol/L (ref 3.5–5.1)
Sodium: 140 mmol/L (ref 135–145)

## 2015-01-16 LAB — CBC
HCT: 34.7 % — ABNORMAL LOW (ref 36.0–46.0)
Hemoglobin: 11.4 g/dL — ABNORMAL LOW (ref 12.0–15.0)
MCH: 31.6 pg (ref 26.0–34.0)
MCHC: 32.9 g/dL (ref 30.0–36.0)
MCV: 96.1 fL (ref 78.0–100.0)
Platelets: 154 10*3/uL (ref 150–400)
RBC: 3.61 MIL/uL — ABNORMAL LOW (ref 3.87–5.11)
RDW: 13.4 % (ref 11.5–15.5)
WBC: 7.1 10*3/uL (ref 4.0–10.5)

## 2015-01-16 MED ORDER — PNEUMOCOCCAL VAC POLYVALENT 25 MCG/0.5ML IJ INJ: 0.5000 mL | INJECTION | INTRAMUSCULAR | Status: DC

## 2015-01-16 MED ORDER — IBUPROFEN 600 MG PO TABS: 600.0000 mg | ORAL_TABLET | Freq: Four times a day (QID) | ORAL | Status: DC | PRN

## 2015-01-16 MED ORDER — OXYCODONE-ACETAMINOPHEN 5-325 MG PO TABS: 1.0000 | ORAL_TABLET | ORAL | Status: DC | PRN

## 2015-01-16 NOTE — Discharge Instructions (Signed)
Start your methotrexate in 2 weeks.  Start your Plaquenil after your office visit next week.   Thank you,   Josefa Half, MD   Laparoscopically Assisted Vaginal Hysterectomy, Care After Refer to this sheet in the next few weeks. These instructions provide you with information on caring for yourself after your procedure. Your health care provider may also give you more specific instructions. Your treatment has been planned according to current medical practices, but problems sometimes occur. Call your health care provider if you have any problems or questions after your procedure. WHAT TO EXPECT AFTER THE PROCEDURE After your procedure, it is typical to have the following:  Abdominal pain. You will be given pain medicine to control it.  Sore throat from the breathing tube that was inserted during surgery. HOME CARE INSTRUCTIONS  Only take over-the-counter or prescription medicines for pain, discomfort, or fever as directed by your health care provider.  Do not take aspirin. It can cause bleeding.  Do not drive when taking pain medicine.  Follow your health care provider's advice regarding diet, exercise, lifting, driving, and general activities.  Resume your usual diet as directed and allowed.  Get plenty of rest and sleep.  Do not douche, use tampons, or have sexual intercourse for at least 6 weeks, or until your health care provider gives you permission.  Change your bandages (dressings) as directed by your health care provider.  Monitor your temperature and notify your health care provider of a fever.  Take showers instead of baths for 2-3 weeks.  Do not drink alcohol until your health care provider gives you permission.  If you develop constipation, you may take a mild laxative with your health care provider's permission. Bran foods may help with constipation problems. Drinking enough fluids to keep your urine clear or pale yellow may help as well.  Try to have someone  home with you for 1-2 weeks to help around the house.  Keep all of your follow-up appointments as directed by your health care provider. SEEK MEDICAL CARE IF:   You have swelling, redness, or increasing pain around your incision sites.  You have pus coming from your incision.  You notice a bad smell coming from your incision.  Your incision breaks open.  You feel dizzy or lightheaded.  You have pain or bleeding when you urinate.  You have persistent diarrhea.  You have persistent nausea and vomiting.  You have abnormal vaginal discharge.  You have a rash.  You have any type of abnormal reaction or develop an allergy to your medicine.  You have poor pain control with your prescribed medicine. SEEK IMMEDIATE MEDICAL CARE IF:   You have a fever.  You have severe abdominal pain.  You have chest pain.  You have shortness of breath.  You faint.  You have pain, swelling, or redness in your leg.  You have heavy vaginal bleeding with blood clots. MAKE SURE YOU:  Understand these instructions.  Will watch your condition.  Will get help right away if you are not doing well or get worse. Document Released: 08/27/2011 Document Revised: 09/12/2013 Document Reviewed: 03/23/2013 [employer] Patient Information 2015 [place], [place]. This information is not intended to replace advice given to you by your health care provider. Make sure you discuss any questions you have with your health care provider.

## 2015-01-16 NOTE — Progress Notes (Signed)
Pt discharged home with husband... Discharge instructions reviewed with pt and she verbalized understanding... Condition stable... No equipment... Ambulated to car with Janalyn Shy, RN.

## 2015-01-16 NOTE — Progress Notes (Signed)
Ur chart review completed.  

## 2015-01-16 NOTE — Progress Notes (Signed)
1 Day Post-Op Procedure(s) (LRB): LAPAROSCOPIC ASSISTED VAGINAL HYSTERECTOMY (N/A) BILATERAL SALPINGO OOPHORECTOMY (Bilateral)  Subjective: Patient reports tolerating PO.   Foley and vaginal packing out.  No void yet.  Ambulating.  Good pain control with Toradol.  PCA off.  Objective: I have reviewed patient's vital signs, intake and output and labs. T 98,  BP 98/55,  P 58, RR 18 I/O - 5256/2675 cc Hgb 11.4  General: alert and cooperative Resp: clear to auscultation bilaterally Cardio: regular rate and rhythm, S1, S2 normal, no murmur, click, rub or gallop GI: soft, non-tender; bowel sounds normal; no masses,  no organomegaly and incision: clean, dry and intact Vaginal Bleeding: none  Assessment: s/p Procedure(s): LAPAROSCOPIC ASSISTED VAGINAL HYSTERECTOMY (N/A) BILATERAL SALPINGO OOPHORECTOMY (Bilateral): progressing well  Plan: Discharge home  Percocet and Motrin for pain medication.  Follow up in 5 days.  Instructions and precautions given.      Gilman Schmidt, [place] E 01/16/2015, 7:33 AM

## 2015-01-18 ENCOUNTER — Telehealth: Payer: Self-pay

## 2015-01-18 NOTE — Telephone Encounter (Signed)
-----   Message from Nunzio Cobbs, MD sent at 01/16/2015  6:38 PM EDT ----- Please contact patient with surgical pathology from 01/14/15 showing uterine fibroids, adenomyosis, and endometriosis of the surface of the uterus.   Cervix, tubes and ovaries were unremarkable.  There was no sign of any cancer.   Thank you!

## 2015-01-18 NOTE — Telephone Encounter (Signed)
Spoke with patient. Advised patient of results as seen below from [place]. Patient is agreeable and verbalizes understanding.  Routing to provider for final review. Patient agreeable to disposition. Will close encounter

## 2015-01-21 ENCOUNTER — Encounter: Payer: Self-pay | Admitting: Obstetrics and Gynecology

## 2015-01-21 ENCOUNTER — Ambulatory Visit (INDEPENDENT_AMBULATORY_CARE_PROVIDER_SITE_OTHER): Payer: BLUE CROSS/BLUE SHIELD | Admitting: Obstetrics and Gynecology

## 2015-01-21 VITALS — BP 100/64 | HR 60 | Ht 66.0 in | Wt 159.2 lb

## 2015-01-21 DIAGNOSIS — Z90710 Acquired absence of both cervix and uterus: Secondary | ICD-10-CM

## 2015-01-21 DIAGNOSIS — R300 Dysuria: Secondary | ICD-10-CM

## 2015-01-21 MED ORDER — CIPROFLOXACIN HCL 500 MG PO TABS: 500.0000 mg | ORAL_TABLET | Freq: Two times a day (BID) | ORAL | Status: DC

## 2015-01-21 NOTE — Progress Notes (Signed)
Patient ID: Rebecca Fleming, female   DOB: 22-Feb-1966, 49 y.o.   MRN: [id number] GYNECOLOGY  VISIT   HPI: 49 y.o.   Married  Caucasian  female   G2P2 with Patient's last menstrual period was 11/12/2014.   here for 1 week follow up.   Stopped Percocet.  Percocet caused itching.  Using Motrin which is also helping her joint pain. Little bit of vaginal bleeding.  Some dysuria sometimes.  No fevers.  Normal bowel function.   Pathology showed adenomyosis, fibroids, endometriosis, benign cervix/uterus/tubes/ovaries.   Urine dip - negative.   GYNECOLOGIC HISTORY: Patient's last menstrual period was 11/12/2014. Contraception:   Hysterectomy Menopausal hormone therapy: none Last pap:  08-01-14 wnl:neg HR HPV Last mammogram: 2009--Hx bilateral mastectomy for left breast cancer.        OB History    Gravida Para Term Preterm AB TAB SAB Ectopic Multiple Living   2 2       1 3          Patient Active Problem List   Diagnosis Date Noted  . Status post laparoscopic assisted vaginal hysterectomy (LAVH) 01/15/2015  . Breast cancer, left breast 11/07/2014  . Postmenopausal bleeding 07/13/2013  . Rheumatoid arthritis 01/11/2012  . Psoriasis 01/11/2012    Past Medical History  Diagnosis Date  . Arthritis   . RA (rheumatoid arthritis)   . Anemia   . Blood transfusion without reported diagnosis 1976    1976  following surgery at [facility]   . Fibroid   . History of breast cancer   . Scoliosis     tx with robaxin prn - rarely uses  . Abnormal uterine bleeding   . Environmental and seasonal allergies   . Asthma     exercised induced asthma - rarely uses inhaler  . Heart murmur     never has caused any problems  . Headache     sleeps , no meds  . Cancer 08/2008    left breast--has bilateral mastectomy  . SVD (spontaneous vaginal delivery) 1996    x 1  . Complication of anesthesia     pt states" gets shakes" with general    Past Surgical History  Procedure Laterality Date  . Cesarean  section  01/01/2004    Dr Quincy Simmonds  . Breast lumpectomy w/ needle localization  04/23/2008    w [facility] Dr Margot Chimes  . Portacath placement  05/17/2008    Dr Margot Chimes  . Mastectomy  09/11/2008    bilateral - Dr Margot Chimes  . Insertion of tissue expander after mastectomy  09/11/2008    Dr Harlow Mares  . Port-a-cath removal  09/09/2009    Dr Margot Chimes  . Umbilical hernia repair  09/09/2009    Dr Margot Chimes  . Hysteroscopy w/d&c  07/21/2011    Procedure: DILATATION AND CURETTAGE (D&C) /HYSTEROSCOPY;  Surgeon: Arloa Koh;  Location: [facility];  Service: Gynecology;  Laterality: N/A;  with Ultrasound Guidance  . Knee arthroscopy w/ meniscal repair Right 2010    --Dr. Telford Nab  . Appendectomy    . Endometrial biopsy  06/2013, 2015    x 2  . Breast lumpectomy  05/17/2008    x 2 re excise for margin - Dr Margot Chimes, bilateral mastectomy 08/2008  . Removal of tissue expander and placement of implant  12/2008  . Wisdom tooth extraction    . Eye surgery  2003    bilateral lasik  . Left rotator cuff repair  01/2006  . Left foot surgery  2004  .  Laparoscopic assisted vaginal hysterectomy N/A 01/15/2015    Procedure: LAPAROSCOPIC ASSISTED VAGINAL HYSTERECTOMY;  Surgeon: Nunzio Cobbs, MD;  Location: [facility];  Service: Gynecology;  Laterality: N/A;  . Salpingoophorectomy Bilateral 01/15/2015    Procedure: BILATERAL SALPINGO OOPHORECTOMY;  Surgeon: Nunzio Cobbs, MD;  Location: [facility];  Service: Gynecology;  Laterality: Bilateral;    Current Outpatient Prescriptions  Medication Sig Dispense Refill  . albuterol (PROVENTIL HFA;VENTOLIN HFA) 108 (90 BASE) MCG/ACT inhaler Inhale 2 puffs into the lungs every 6 (six) hours as needed. Shortness of breath     . calcium carbonate (OS-CAL) 600 MG TABS tablet Take 600 mg by mouth 2 (two) times daily with a meal.    . cetirizine (ZYRTEC) 10 MG tablet Take 10 mg by mouth daily.      . folic acid (FOLVITE) 1 MG tablet Take 2 mg by mouth daily.    Marland Kitchen ibuprofen (ADVIL,MOTRIN)  600 MG tablet Take 1 tablet (600 mg total) by mouth every 6 (six) hours as needed (mild pain). 30 tablet 0  . Magnesium 500 MG TABS Take 1 tablet by mouth 2 (two) times daily.    . Manganese 50 MG TABS Take 1 tablet by mouth daily.    . methocarbamol (ROBAXIN) 500 MG tablet Take 500 mg by mouth every 8 (eight) hours as needed for muscle spasms.   1  . oxyCODONE-acetaminophen (PERCOCET/ROXICET) 5-325 MG per tablet Take 1-2 tablets by mouth every 4 (four) hours as needed for severe pain (moderate to severe pain (when tolerating fluids)). 30 tablet 0  . pneumococcal 23 valent vaccine (PNU-IMMUNE) 25 MCG/0.5ML injection Inject 0.5 mLs into the muscle tomorrow at 10 am. 2.5 mL 0  . simethicone (MYLICON) 250 MG chewable tablet Chew 125 mg by mouth every 6 (six) hours as needed (For indigestion and bloating.).    Marland Kitchen Triamcinolone Acetonide (NASACORT ALLERGY 24HR NA) Place 1 spray into both nostrils at bedtime.     . ciprofloxacin (CIPRO) 500 MG tablet Take 1 tablet (500 mg total) by mouth 2 (two) times daily. 14 tablet 0  . GLUCOSAMINE PO Take 2 tablets by mouth daily.     . hydroxychloroquine (PLAQUENIL) 200 MG tablet Take 200 mg by mouth 2 (two) times daily.      . methotrexate (RHEUMATREX) 2.5 MG tablet   0   No current facility-administered medications for this visit.     ALLERGIES: Latex; Other; and Percocet  Family History  Problem Relation Age of Onset  . Breast cancer Mother   . Diabetes Mother   . Hypertension Mother   . Stroke Mother   . Heart failure Father   . Hypertension Father     History   Social History  . Marital Status: Married    Spouse Name: N/A  . Number of Children: N/A  . Years of Education: N/A   Occupational History  . Not on file.   Social History Main Topics  . Smoking status: Never Smoker   . Smokeless tobacco: Never Used  . Alcohol Use: 0.6 oz/week    1 Standard drinks or equivalent per week     Comment: occ glass of wine or beer  . Drug Use: No  .  Sexual Activity:    Partners: Male    Birth Control/ Protection: Post-menopausal     Comment: Hyst/BSO   Other Topics Concern  . Not on file   Social History Narrative    ROS:  Pertinent items are  noted in HPI.  PHYSICAL EXAMINATION:    BP 100/64 mmHg  Pulse 60  Ht 5\' 6"  (1.676 m)  Wt 159 lb 3.2 oz (72.213 kg)  BMI 25.71 kg/m2  LMP 11/12/2014     General appearance: alert, cooperative and appears stated age   Abdomen: incisions intact, soft, non-tender; no masses,  no organomegaly   Pelvic: External genitalia:  no lesions              Urethra:  normal appearing urethra with no masses, tenderness or lesions              Bartholins and Skenes: normal                 Vagina:  Suture line intact, ecchymoses along suture line.  Nontender.               Cervix:  absent                   Bimanual Exam:  Uterus:  absent                                      Adnexa:  no masses                                     ASSESSMENT  Status post LAVH/BSO. Dysuria.  Urine dip negative Inflammation of suture line?  No surrounding erythema.  Mild cuff cellulitis? Off Methotrexate.  Off Plaquenil.   PLAN  UC sent. Ciprofloxacin 500 mg po bid for 7 days.  Recheck in one week.  Call for fever, increased pain , increased bleeding or any concern.  Anticipate starting Methotrexate and Palquenil next week.   An After Visit Summary was printed and given to the patient.  __15____ minutes face to face time of which over 50% was spent in counseling.

## 2015-01-24 ENCOUNTER — Other Ambulatory Visit: Payer: Self-pay | Admitting: Obstetrics and Gynecology

## 2015-01-24 LAB — URINE CULTURE

## 2015-01-24 MED ORDER — AMPICILLIN 250 MG PO CAPS: 250.0000 mg | ORAL_CAPSULE | Freq: Four times a day (QID) | ORAL | Status: DC

## 2015-01-28 ENCOUNTER — Ambulatory Visit: Payer: BLUE CROSS/BLUE SHIELD | Admitting: Obstetrics and Gynecology

## 2015-01-28 ENCOUNTER — Telehealth: Payer: Self-pay | Admitting: Obstetrics and Gynecology

## 2015-01-28 NOTE — Telephone Encounter (Signed)
Patient was told not to drive for 2 weeks after surgery. Patient's post op appointment is past the two week time frame, pt. Is asking if it is okay for her drive?

## 2015-01-28 NOTE — Telephone Encounter (Signed)
Spoke with patient. Patient had hysterectomy on 01/15/2015 with Dr.Silva. Patient inquiring about when she will be able to drive. Patient has post op follow up scheduled for 01/31/2015 with Dr.Silva. Advised may begin driving after tomorrow 5/10 as this will be the two weeks from surgery. Advised should not take any pain medication and drive. Patient is agreeable. "I have not been taking it for a while so I am good with that."   Routing to provider for final review. Patient agreeable to disposition. Patient aware provider will review message and nurse will return call with any additional instructions or change of disposition. Will close encounter.

## 2015-01-30 ENCOUNTER — Ambulatory Visit: Payer: BLUE CROSS/BLUE SHIELD | Admitting: Obstetrics and Gynecology

## 2015-01-31 ENCOUNTER — Ambulatory Visit (INDEPENDENT_AMBULATORY_CARE_PROVIDER_SITE_OTHER): Payer: BLUE CROSS/BLUE SHIELD | Admitting: Obstetrics and Gynecology

## 2015-01-31 ENCOUNTER — Encounter: Payer: Self-pay | Admitting: Obstetrics and Gynecology

## 2015-01-31 VITALS — BP 108/70 | HR 78 | Temp 97.8°F | Resp 16 | Ht 66.0 in | Wt 159.0 lb

## 2015-01-31 DIAGNOSIS — B952 Enterococcus as the cause of diseases classified elsewhere: Secondary | ICD-10-CM

## 2015-01-31 DIAGNOSIS — N390 Urinary tract infection, site not specified: Secondary | ICD-10-CM

## 2015-01-31 NOTE — Progress Notes (Signed)
GYNECOLOGY  VISIT   HPI: 49 y.o.   Married  Caucasian  female   G2P2 with Patient's last menstrual period was 11/12/2014.   here for   2 week Post Op from 01/15/15  LAPAROSCOPIC ASSISTED VAGINAL HYSTERECTOMY N/A General Anesthesia    BILATERAL SALPINGO OOPHORECTOMY Bilateral General Anesthesia         Diagnosed with Enterococcus UTI post op.  Was initially on Cipro and this was changed to Ampicillin.  Just finished abx this am. Having a little vaginal bleeding.   Wants to restart RA meds.   GYNECOLOGIC HISTORY: Patient's last menstrual period was 11/12/2014. Contraception: Hysterectomy  Menopausal hormone therapy: None Last mammogram: 2009 Bilateral Mastectomy for Breast Cancer Last pap smear: 08/01/14 Neg. HR HPV:Neg        OB History    Gravida Para Term Preterm AB TAB SAB Ectopic Multiple Living   2 2       1 3          Patient Active Problem List   Diagnosis Date Noted  . Status post laparoscopic assisted vaginal hysterectomy (LAVH) 01/15/2015  . Breast cancer, left breast 11/07/2014  . Postmenopausal bleeding 07/13/2013  . Rheumatoid arthritis 01/11/2012  . Psoriasis 01/11/2012    Past Medical History  Diagnosis Date  . Arthritis   . RA (rheumatoid arthritis)   . Anemia   . Blood transfusion without reported diagnosis 1976    1976  following surgery at [facility]   . Fibroid   . History of breast cancer   . Scoliosis     tx with robaxin prn - rarely uses  . Abnormal uterine bleeding   . Environmental and seasonal allergies   . Asthma     exercised induced asthma - rarely uses inhaler  . Heart murmur     never has caused any problems  . Headache     sleeps , no meds  . Cancer 08/2008    left breast--has bilateral mastectomy  . SVD (spontaneous vaginal delivery) 1996    x 1  . Complication of anesthesia     pt states" gets shakes" with general    Past Surgical History  Procedure Laterality Date  . Cesarean section  01/01/2004    Dr Quincy Simmonds  . Breast  lumpectomy w/ needle localization  04/23/2008    w [facility] Dr Margot Chimes  . Portacath placement  05/17/2008    Dr Margot Chimes  . Mastectomy  09/11/2008    bilateral - Dr Margot Chimes  . Insertion of tissue expander after mastectomy  09/11/2008    Dr Harlow Mares  . Port-a-cath removal  09/09/2009    Dr Margot Chimes  . Umbilical hernia repair  09/09/2009    Dr Margot Chimes  . Hysteroscopy w/d&c  07/21/2011    Procedure: DILATATION AND CURETTAGE (D&C) /HYSTEROSCOPY;  Surgeon: Arloa Koh;  Location: [facility];  Service: Gynecology;  Laterality: N/A;  with Ultrasound Guidance  . Knee arthroscopy w/ meniscal repair Right 2010    --Dr. Telford Nab  . Appendectomy    . Endometrial biopsy  06/2013, 2015    x 2  . Breast lumpectomy  05/17/2008    x 2 re excise for margin - Dr Margot Chimes, bilateral mastectomy 08/2008  . Removal of tissue expander and placement of implant  12/2008  . Wisdom tooth extraction    . Eye surgery  2003    bilateral lasik  . Left rotator cuff repair  01/2006  . Left foot surgery  2004  . Laparoscopic assisted  vaginal hysterectomy N/A 01/15/2015    Procedure: LAPAROSCOPIC ASSISTED VAGINAL HYSTERECTOMY;  Surgeon: Nunzio Cobbs, MD;  Location: [facility];  Service: Gynecology;  Laterality: N/A;  . Salpingoophorectomy Bilateral 01/15/2015    Procedure: BILATERAL SALPINGO OOPHORECTOMY;  Surgeon: Nunzio Cobbs, MD;  Location: [facility];  Service: Gynecology;  Laterality: Bilateral;    Current Outpatient Prescriptions  Medication Sig Dispense Refill  . calcium carbonate (OS-CAL) 600 MG TABS tablet Take 600 mg by mouth 2 (two) times daily with a meal.    . cetirizine (ZYRTEC) 10 MG tablet Take 10 mg by mouth daily.      . folic acid (FOLVITE) 1 MG tablet Take 2 mg by mouth daily.    . Magnesium 500 MG TABS Take 1 tablet by mouth 2 (two) times daily.    . Manganese 50 MG TABS Take 1 tablet by mouth daily.    . Triamcinolone Acetonide (NASACORT ALLERGY 24HR NA) Place 1 spray into both nostrils at bedtime.      Marland Kitchen albuterol (PROVENTIL HFA;VENTOLIN HFA) 108 (90 BASE) MCG/ACT inhaler Inhale 2 puffs into the lungs every 6 (six) hours as needed. Shortness of breath     . GLUCOSAMINE PO Take 2 tablets by mouth daily.     . hydroxychloroquine (PLAQUENIL) 200 MG tablet Take 200 mg by mouth 2 (two) times daily.      Marland Kitchen ibuprofen (ADVIL,MOTRIN) 600 MG tablet Take 1 tablet (600 mg total) by mouth every 6 (six) hours as needed (mild pain). (Patient not taking: Reported on 01/31/2015) 30 tablet 0  . methocarbamol (ROBAXIN) 500 MG tablet Take 500 mg by mouth every 8 (eight) hours as needed for muscle spasms.   1  . methotrexate (RHEUMATREX) 2.5 MG tablet   0  . simethicone (MYLICON) 916 MG chewable tablet Chew 125 mg by mouth every 6 (six) hours as needed (For indigestion and bloating.).     No current facility-administered medications for this visit.     ALLERGIES: Latex; Other; and Percocet  Family History  Problem Relation Age of Onset  . Breast cancer Mother   . Diabetes Mother   . Hypertension Mother   . Stroke Mother   . Heart failure Father   . Hypertension Father     History   Social History  . Marital Status: Married    Spouse Name: N/A  . Number of Children: N/A  . Years of Education: N/A   Occupational History  . Not on file.   Social History Main Topics  . Smoking status: Never Smoker   . Smokeless tobacco: Never Used  . Alcohol Use: 0.6 oz/week    1 Standard drinks or equivalent per week     Comment: occ glass of wine or beer  . Drug Use: No  . Sexual Activity:    Partners: Male    Birth Control/ Protection: Post-menopausal     Comment: Hyst/BSO   Other Topics Concern  . Not on file   Social History Narrative    ROS:  Pertinent items are noted in HPI.  PHYSICAL EXAMINATION:    BP 108/70 mmHg  Pulse 78  Temp(Src) 97.8 F (36.6 C) (Oral)  Resp 16  Ht 5\' 6"  (1.676 m)  Wt 159 lb (72.122 kg)  BMI 25.68 kg/m2  LMP 11/12/2014     General appearance: alert,  cooperative and appears stated age  Abdomen: incisions intact, soft, non-tender; no masses,  no organomegaly   Pelvic: External genitalia:  no lesions              Urethra:  normal appearing urethra with no masses, tenderness or lesions              Bartholins and Skenes: normal                 Vagina:  Suture line intact.  No erythema or ecchymotic areas.              Cervix: normal appearance                   Bimanual Exam:  Uterus:   absent                                      Adnexa:  no masses                                   ASSESSMENT  Status post LAVH/BSO - doing well.  Enterococcus UTI - treated.   PLAN  Repeat urine culture today.  Return for 6 week post op visit.  OK to resume usual RA meds.    An After Visit Summary was printed and given to the patient.

## 2015-01-31 NOTE — Patient Instructions (Signed)
Please have reduced activity for 6 weeks post op.

## 2015-02-01 LAB — URINE CULTURE
Colony Count: NO GROWTH
Organism ID, Bacteria: NO GROWTH

## 2015-02-01 NOTE — Discharge Summary (Signed)
Physician Discharge Summary  Patient ID: Rebecca Fleming MRN: [id number] DOB/AGE: 06-15-1966 49 y.o.  Admit date: 01/15/2015 Discharge date:  01/16/15 Admission Diagnoses:  Recurrent postmenopausal bleeding  Discharge Diagnoses:  Recurrent postmenopausal bleeding Status post laparoscopically assisted vaginal hysterectomy with bilateral salpingo-oophorectomy  Active Problems:   Status post laparoscopic assisted vaginal hysterectomy (LAVH)   Discharged Condition: good  Hospital Course: The patient was admitted on 01/15/15 for a laparoscopically assisted vaginal hysterectomy with bilateral salpingo-oophorectomy which were performed without complication while under general anesthesia.  The patient's post op course was uneventful.  She had a morphine PCA and Toradol for pain control initially, and this was converted over to Percocet and Motrin on post op day one when the patient began taking po well.  She ambulated independently and wore PAS and Ted hose for DVT prophylaxis while in bed.  Her vaginal packing and foley catheter were removed on post op day one, and she voided good volumes.  The patient's vital signs remained stable and she demonstrated no signs of infection during her hospitalization.  The patient's post op day one Hgb was    11.4, and she was tolerating this well. She was found to be in good condition and ready for discharge on post op day one.      Consults: None  Significant Diagnostic Studies: labs:  See hospital course above.  Treatments: surgery:  laparoscopically assisted vaginal hysterectomy with bilateral salpingo-oophorectomy on 01/15/15.  Discharge Exam: Blood pressure 98/55, pulse 58, temperature 98 F (36.7 C), temperature source Oral, resp. rate 18, height 5\' 7"  (1.702 m), weight 160 lb (72.576 kg), last menstrual period 06/28/2013, SpO2 93 %.  General: alert and cooperative Resp: clear to auscultation bilaterally Cardio: regular rate and rhythm, S1, S2 normal, no  murmur, click, rub or gallop GI: soft, non-tender; bowel sounds normal; no masses, no organomegaly and incision: clean, dry and intact Vaginal Bleeding: none  Disposition: 01-Home or Self Care Discharge instructions and precautions reviewed in verbal and written form.  Will stay of medications for rheumatoid arthritis for one to two weeks.    Medication List    STOP taking these medications        methotrexate 2.5 MG tablet  Commonly known as:  RHEUMATREX      TAKE these medications        albuterol 108 (90 BASE) MCG/ACT inhaler  Commonly known as:  PROVENTIL HFA;VENTOLIN HFA  Inhale 2 puffs into the lungs every 6 (six) hours as needed. Shortness of breath     calcium carbonate 600 MG Tabs tablet  Commonly known as:  OS-CAL  Take 600 mg by mouth 2 (two) times daily with a meal.     cetirizine 10 MG tablet  Commonly known as:  ZYRTEC  Take 10 mg by mouth daily.     folic acid 1 MG tablet  Commonly known as:  FOLVITE  Take 2 mg by mouth daily.     GLUCOSAMINE PO  Take 2 tablets by mouth daily.     hydroxychloroquine 200 MG tablet  Commonly known as:  PLAQUENIL  Take 200 mg by mouth 2 (two) times daily.     ibuprofen 600 MG tablet  Commonly known as:  ADVIL,MOTRIN  Take 1 tablet (600 mg total) by mouth every 6 (six) hours as needed (mild pain).     Magnesium 500 MG Tabs  Take 1 tablet by mouth 2 (two) times daily.     Manganese 50 MG Tabs  Take 1  tablet by mouth daily.     methocarbamol 500 MG tablet  Commonly known as:  ROBAXIN  Take 500 mg by mouth every 8 (eight) hours as needed for muscle spasms.     NASACORT ALLERGY 24HR NA  Place 1 spray into both nostrils at bedtime.     simethicone 125 MG chewable tablet  Commonly known as:  MYLICON  Chew 586 mg by mouth every 6 (six) hours as needed (For indigestion and bloating.).           Follow-up Information    Follow up with Darcel Bayley, MD In 2 weeks.   Specialty:  Obstetrics and Gynecology    Contact information:   [address] [place] [address] [phone]       Signed: Aundria Rud, MD  02/01/2015, 9:40 AM

## 2015-02-04 ENCOUNTER — Ambulatory Visit: Payer: BLUE CROSS/BLUE SHIELD

## 2015-02-28 ENCOUNTER — Encounter: Payer: Self-pay | Admitting: Obstetrics and Gynecology

## 2015-02-28 ENCOUNTER — Ambulatory Visit (INDEPENDENT_AMBULATORY_CARE_PROVIDER_SITE_OTHER): Payer: BLUE CROSS/BLUE SHIELD | Admitting: Obstetrics and Gynecology

## 2015-02-28 VITALS — BP 90/60 | HR 68 | Resp 16 | Wt 159.0 lb

## 2015-02-28 DIAGNOSIS — Z9889 Other specified postprocedural states: Secondary | ICD-10-CM

## 2015-02-28 NOTE — Progress Notes (Signed)
Patient ID: Rebecca Fleming, female   DOB: [birth year], 49 y.o.   MRN: [id number] GYNECOLOGY  VISIT   HPI: 49 y.o.   Married  Caucasian  female   G2P2 with Patient's last menstrual period was 11/12/2014.   here for  6 week post op. LAVH. Patient is doing well Pleased with surgery.  No bleeding or spotting.  No bladder symptoms.   No real change in hot flashes.   Had a flare of RA off meds.  Doing a course of prednisone right now.   GYNECOLOGIC HISTORY: Patient's last menstrual period was 11/12/2014. Contraception:Hysterectomy  Menopausal hormone therapy: N/A Last mammogram: 2009 Bilateral Mastectomy for breast cancer Last pap smear: 08-01-14 WNL NEG HR HPV         OB History    Gravida Para Term Preterm AB TAB SAB Ectopic Multiple Living   2 2       1 3          Patient Active Problem List   Diagnosis Date Noted  . Status post laparoscopic assisted vaginal hysterectomy (LAVH) 01/15/2015  . Breast cancer, left breast 11/07/2014  . Postmenopausal bleeding 07/13/2013  . Rheumatoid arthritis 01/11/2012  . Psoriasis 01/11/2012    Past Medical History  Diagnosis Date  . Arthritis   . RA (rheumatoid arthritis)   . Anemia   . Blood transfusion without reported diagnosis 1976    1976  following surgery at [facility]   . Fibroid   . History of breast cancer   . Scoliosis     tx with robaxin prn - rarely uses  . Abnormal uterine bleeding   . Environmental and seasonal allergies   . Asthma     exercised induced asthma - rarely uses inhaler  . Heart murmur     never has caused any problems  . Headache     sleeps , no meds  . Cancer 08/2008    left breast--has bilateral mastectomy  . SVD (spontaneous vaginal delivery) 1996    x 1  . Complication of anesthesia     pt states" gets shakes" with general    Past Surgical History  Procedure Laterality Date  . Cesarean section  01/01/2004    Dr Quincy Simmonds  . Breast lumpectomy w/ needle localization  04/23/2008    w [facility] Dr Margot Chimes  .  Portacath placement  05/17/2008    Dr Margot Chimes  . Mastectomy  09/11/2008    bilateral - Dr Margot Chimes  . Insertion of tissue expander after mastectomy  09/11/2008    Dr Harlow Mares  . Port-a-cath removal  09/09/2009    Dr Margot Chimes  . Umbilical hernia repair  09/09/2009    Dr Margot Chimes  . Hysteroscopy w/d&c  07/21/2011    Procedure: DILATATION AND CURETTAGE (D&C) /HYSTEROSCOPY;  Surgeon: Arloa Koh;  Location: [facility];  Service: Gynecology;  Laterality: N/A;  with Ultrasound Guidance  . Knee arthroscopy w/ meniscal repair Right 2010    --Dr. Telford Nab  . Appendectomy    . Endometrial biopsy  06/2013, 2015    x 2  . Breast lumpectomy  05/17/2008    x 2 re excise for margin - Dr Margot Chimes, bilateral mastectomy 08/2008  . Removal of tissue expander and placement of implant  12/2008  . Wisdom tooth extraction    . Eye surgery  2003    bilateral lasik  . Left rotator cuff repair  01/2006  . Left foot surgery  2004  . Laparoscopic assisted vaginal hysterectomy N/A 01/15/2015  Procedure: LAPAROSCOPIC ASSISTED VAGINAL HYSTERECTOMY;  Surgeon: Nunzio Cobbs, MD;  Location: [facility];  Service: Gynecology;  Laterality: N/A;  . Salpingoophorectomy Bilateral 01/15/2015    Procedure: BILATERAL SALPINGO OOPHORECTOMY;  Surgeon: Nunzio Cobbs, MD;  Location: [facility];  Service: Gynecology;  Laterality: Bilateral;    Current Outpatient Prescriptions  Medication Sig Dispense Refill  . albuterol (PROVENTIL HFA;VENTOLIN HFA) 108 (90 BASE) MCG/ACT inhaler Inhale 2 puffs into the lungs every 6 (six) hours as needed. Shortness of breath     . calcium carbonate (OS-CAL) 600 MG TABS tablet Take 600 mg by mouth 2 (two) times daily with a meal.    . cetirizine (ZYRTEC) 10 MG tablet Take 10 mg by mouth daily.      . folic acid (FOLVITE) 1 MG tablet Take 2 mg by mouth daily.    Marland Kitchen GLUCOSAMINE PO Take 2 tablets by mouth daily.     . hydroxychloroquine (PLAQUENIL) 200 MG tablet Take 200 mg by mouth 2 (two) times  daily.      . Magnesium 500 MG TABS Take 1 tablet by mouth 2 (two) times daily.    . Manganese 50 MG TABS Take 1 tablet by mouth daily.    . methocarbamol (ROBAXIN) 500 MG tablet Take 500 mg by mouth every 8 (eight) hours as needed for muscle spasms.   1  . methotrexate (RHEUMATREX) 2.5 MG tablet   0  . predniSONE (DELTASONE) 5 MG tablet   0  . simethicone (MYLICON) 382 MG chewable tablet Chew 125 mg by mouth every 6 (six) hours as needed (For indigestion and bloating.).    Marland Kitchen Triamcinolone Acetonide (NASACORT ALLERGY 24HR NA) Place 1 spray into both nostrils at bedtime.      No current facility-administered medications for this visit.     ALLERGIES: Latex; Other; and Percocet  Family History  Problem Relation Age of Onset  . Breast cancer Mother   . Diabetes Mother   . Hypertension Mother   . Stroke Mother   . Heart failure Father   . Hypertension Father     History   Social History  . Marital Status: Married    Spouse Name: N/A  . Number of Children: N/A  . Years of Education: N/A   Occupational History  . Not on file.   Social History Main Topics  . Smoking status: Never Smoker   . Smokeless tobacco: Never Used  . Alcohol Use: 0.6 oz/week    1 Standard drinks or equivalent per week     Comment: occ glass of wine or beer  . Drug Use: No  . Sexual Activity:    Partners: Male    Birth Control/ Protection: Post-menopausal     Comment: Hyst/BSO   Other Topics Concern  . Not on file   Social History Narrative    ROS:  Pertinent items are noted in HPI.  PHYSICAL EXAMINATION:    BP 90/60 mmHg  Pulse 68  Resp 16  Wt 159 lb (72.122 kg)  LMP 11/12/2014    General appearance: alert, cooperative and appears stated age   Abdomen: incisions intact. soft, non-tender; bowel sounds normal; no masses,  no organomegaly   Pelvic: External genitalia:  no lesions              Urethra:  normal appearing urethra with no masses, tenderness or lesions               Bartholins and Skenes: normal  Vagina: normal appearing vagina with normal color and discharge, no lesions.  Suture line still healing although no sutures present.              Cervix: absent           Bimanual Exam:  Uterus:  uterus absent              Adnexa: no mass, fullness, tenderness             Chaperone was present for exam.  ASSESSMENT  Status post LAVH/BSO. DoIng well.  PLAN  Counseled regarding decreased activity for 1 more week.  Then resume all normal activities. Return in 11 months.   An After Visit Summary was printed and given to the patient.  __15____ minutes face to face time of which over 50% was spent in counseling.

## 2015-05-17 ENCOUNTER — Emergency Department
Admission: EM | Admit: 2015-05-17 | Discharge: 2015-05-17 | Disposition: A | Discharge status: Home / Self Care | Payer: BLUE CROSS/BLUE SHIELD | Source: Home / Self Care | Attending: Emergency Medicine | Admitting: Emergency Medicine

## 2015-05-17 ENCOUNTER — Encounter: Payer: Self-pay | Admitting: *Deleted

## 2015-05-17 DIAGNOSIS — M25532 Pain in left wrist: Secondary | ICD-10-CM

## 2015-05-17 MED ORDER — PREDNISONE 10 MG (21) PO TBPK: 10.0000 mg | ORAL_TABLET | Freq: Every day | ORAL | Status: DC

## 2015-05-17 NOTE — ED Notes (Signed)
Pt c/o LT wrist pain and swelling x 5 days. Denies specific injury. Reports bumping it on a door last weekend. History of RA.

## 2015-05-17 NOTE — ED Provider Notes (Signed)
CSN: [id number]     Arrival date & time 05/17/15  1649 History   First MD Initiated Contact with Patient 05/17/15 1717     Chief Complaint  Patient presents with  . Wrist Pain   (Consider location/radiation/quality/duration/timing/severity/associated sxs/prior Treatment) HPI She presents with left wrist pain and swelling for the last 5 days.  She does not recall specific injury, but may have bumped it on the door last weekend.  She has also been helping her son move out of the house and going to college, so has been carrying some heavy objects.  She has a history of RA and takes Plaquenil.  Pain seems to be worse at night and she has not been using any other medications or modalities.  Pain is intermittent, daily, moderate.  Past Medical History  Diagnosis Date  . Arthritis   . RA (rheumatoid arthritis)   . Anemia   . Blood transfusion without reported diagnosis 1976    1976  following surgery at [facility]   . Fibroid   . History of breast cancer   . Scoliosis     tx with robaxin prn - rarely uses  . Abnormal uterine bleeding   . Environmental and seasonal allergies   . Asthma     exercised induced asthma - rarely uses inhaler  . Heart murmur     never has caused any problems  . Headache     sleeps , no meds  . Cancer 08/2008    left breast--has bilateral mastectomy  . SVD (spontaneous vaginal delivery) 1996    x 1  . Complication of anesthesia     pt states" gets shakes" with general   Past Surgical History  Procedure Laterality Date  . Cesarean section  01/01/2004    Dr Quincy Simmonds  . Breast lumpectomy w/ needle localization  04/23/2008    w [facility] Dr Margot Chimes  . Portacath placement  05/17/2008    Dr Margot Chimes  . Mastectomy  09/11/2008    bilateral - Dr Margot Chimes  . Insertion of tissue expander after mastectomy  09/11/2008    Dr Harlow Mares  . Port-a-cath removal  09/09/2009    Dr Margot Chimes  . Umbilical hernia repair  09/09/2009    Dr Margot Chimes  . Hysteroscopy w/d&c  07/21/2011    Procedure:  DILATATION AND CURETTAGE (D&C) /HYSTEROSCOPY;  Surgeon: Arloa Koh;  Location: [facility];  Service: Gynecology;  Laterality: N/A;  with Ultrasound Guidance  . Knee arthroscopy w/ meniscal repair Right 2010    --Dr. Telford Nab  . Appendectomy    . Endometrial biopsy  06/2013, 2015    x 2  . Breast lumpectomy  05/17/2008    x 2 re excise for margin - Dr Margot Chimes, bilateral mastectomy 08/2008  . Removal of tissue expander and placement of implant  12/2008  . Wisdom tooth extraction    . Eye surgery  2003    bilateral lasik  . Left rotator cuff repair  01/2006  . Left foot surgery  2004  . Laparoscopic assisted vaginal hysterectomy N/A 01/15/2015    Procedure: LAPAROSCOPIC ASSISTED VAGINAL HYSTERECTOMY;  Surgeon: Nunzio Cobbs, MD;  Location: [facility];  Service: Gynecology;  Laterality: N/A;  . Salpingoophorectomy Bilateral 01/15/2015    Procedure: BILATERAL SALPINGO OOPHORECTOMY;  Surgeon: Nunzio Cobbs, MD;  Location: [facility];  Service: Gynecology;  Laterality: Bilateral;  . Abdominal hysterectomy     Family History  Problem Relation Age of Onset  . Breast cancer  Mother   . Diabetes Mother   . Hypertension Mother   . Stroke Mother   . Heart failure Father   . Hypertension Father    Social History  Substance Use Topics  . Smoking status: Never Smoker   . Smokeless tobacco: Never Used  . Alcohol Use: 0.6 oz/week    1 Standard drinks or equivalent per week     Comment: occ glass of wine or beer   OB History    Gravida Para Term Preterm AB TAB SAB Ectopic Multiple Living   2 2       1 3      Review of Systems  All other systems reviewed and are negative.   Allergies  Latex; Other; and Percocet  Home Medications   Prior to Admission medications   Medication Sig Start Date End Date Taking? Authorizing Provider  calcium carbonate (OS-CAL) 600 MG TABS tablet Take 600 mg by mouth 2 (two) times daily with a meal.   Yes Historical Provider, MD  cetirizine (ZYRTEC) 10  MG tablet Take 10 mg by mouth daily.     Yes Historical Provider, MD  fluticasone (FLONASE) 50 MCG/ACT nasal spray Place into both nostrils daily.   Yes Historical Provider, MD  folic acid (FOLVITE) 1 MG tablet Take 2 mg by mouth daily.   Yes Historical Provider, MD  GLUCOSAMINE PO Take 2 tablets by mouth daily.    Yes Historical Provider, MD  hydroxychloroquine (PLAQUENIL) 200 MG tablet Take 200 mg by mouth 2 (two) times daily.     Yes Historical Provider, MD  methocarbamol (ROBAXIN) 500 MG tablet Take 500 mg by mouth every 8 (eight) hours as needed for muscle spasms.  12/11/14  Yes Historical Provider, MD  methotrexate (RHEUMATREX) 2.5 MG tablet  12/10/14  Yes Historical Provider, MD  montelukast (SINGULAIR) 10 MG tablet Take 10 mg by mouth at bedtime.   Yes Historical Provider, MD  albuterol (PROVENTIL HFA;VENTOLIN HFA) 108 (90 BASE) MCG/ACT inhaler Inhale 2 puffs into the lungs every 6 (six) hours as needed. Shortness of breath     Historical Provider, MD  Magnesium 500 MG TABS Take 1 tablet by mouth 2 (two) times daily.    Historical Provider, MD  Manganese 50 MG TABS Take 1 tablet by mouth daily.    Historical Provider, MD  predniSONE (STERAPRED UNI-PAK 21 TAB) 10 MG (21) TBPK tablet Take 1 tablet (10 mg total) by mouth daily. 6 day pack, use as directed 05/17/15   Janeann Forehand, MD  simethicone (MYLICON) 456 MG chewable tablet Chew 125 mg by mouth every 6 (six) hours as needed (For indigestion and bloating.).    Historical Provider, MD  Triamcinolone Acetonide (NASACORT ALLERGY 24HR NA) Place 1 spray into both nostrils at bedtime.     Historical Provider, MD   Meds Ordered and Administered this Visit  Medications - No data to display  BP 107/70 mmHg  Pulse 64  Temp(Src) 97.9 F (36.6 C) (Oral)  Resp 16  Ht 5' 6.5" (1.689 m)  Wt 161 lb (73.029 kg)  BMI 25.60 kg/m2  SpO2 96%  LMP 11/12/2014 No data found.   Physical Exam  Constitutional: She is oriented to person, place, and  time. She appears well-developed and well-nourished.  HENT:  Head: Normocephalic and atraumatic.  Eyes: No scleral icterus.  Neck: Neck supple.  Cardiovascular: Regular rhythm and normal heart sounds.   Pulmonary/Chest: Effort normal and breath sounds normal. No respiratory distress.  Musculoskeletal:  Left wrist:  Full range of motion.  She is tender to palpation over the TFCC, ulnar aspect.  Resisted ulnar deviation is mildly painful.  There is no ecchymoses but there is mild localized swelling.  Neurological: She is alert and oriented to person, place, and time.  Skin: Skin is warm and dry.  Psychiatric: She has a normal mood and affect. Her speech is normal.  Nursing note and vitals reviewed.   ED Course  Procedures (including critical care time)  Labs Review Labs Reviewed - No data to display  Imaging Review No results found.   Visual Acuity Review  Right Eye Distance:   Left Eye Distance:   Bilateral Distance:    Right Eye Near:   Left Eye Near:    Bilateral Near:         MDM   1. Left wrist pain    Patient most likely with TFCC sprain versus flexor carpi ulnaris tendinitis.  Prescription of prednisone given.  Encourage ice and bracing.  Follow-up restaurant.  Janeann Forehand, MD 05/17/15 [phone]

## 2015-08-05 ENCOUNTER — Ambulatory Visit: Payer: BC Managed Care – PPO | Admitting: Obstetrics and Gynecology

## 2016-01-29 ENCOUNTER — Ambulatory Visit (INDEPENDENT_AMBULATORY_CARE_PROVIDER_SITE_OTHER): Payer: BLUE CROSS/BLUE SHIELD | Admitting: Obstetrics and Gynecology

## 2016-01-29 ENCOUNTER — Encounter: Payer: Self-pay | Admitting: Obstetrics and Gynecology

## 2016-01-29 VITALS — BP 92/58 | HR 76 | Resp 16 | Ht 66.0 in | Wt 160.0 lb

## 2016-01-29 DIAGNOSIS — Z113 Encounter for screening for infections with a predominantly sexual mode of transmission: Secondary | ICD-10-CM | POA: Diagnosis not present

## 2016-01-29 DIAGNOSIS — Z0000 Encounter for general adult medical examination without abnormal findings: Secondary | ICD-10-CM | POA: Diagnosis not present

## 2016-01-29 DIAGNOSIS — Z01419 Encounter for gynecological examination (general) (routine) without abnormal findings: Secondary | ICD-10-CM

## 2016-01-29 LAB — TSH: TSH: 1.17 mIU/L

## 2016-01-29 LAB — COMPREHENSIVE METABOLIC PANEL
ALT: 15 U/L (ref 6–29)
AST: 19 U/L (ref 10–35)
Albumin: 4.3 g/dL (ref 3.6–5.1)
Alkaline Phosphatase: 60 U/L (ref 33–130)
BUN: 11 mg/dL (ref 7–25)
CHLORIDE: 102 mmol/L (ref 98–110)
CO2: 29 mmol/L (ref 20–31)
Calcium: 9.5 mg/dL (ref 8.6–10.4)
Creat: 0.76 mg/dL (ref 0.50–1.05)
GLUCOSE: 62 mg/dL — AB (ref 65–99)
POTASSIUM: 4.3 mmol/L (ref 3.5–5.3)
Sodium: 139 mmol/L (ref 135–146)
Total Bilirubin: 0.5 mg/dL (ref 0.2–1.2)
Total Protein: 6.7 g/dL (ref 6.1–8.1)

## 2016-01-29 LAB — POCT URINALYSIS DIPSTICK
BILIRUBIN UA: NEGATIVE
Blood, UA: NEGATIVE
Glucose, UA: NEGATIVE
KETONES UA: NEGATIVE
LEUKOCYTES UA: NEGATIVE
Nitrite, UA: NEGATIVE
Protein, UA: NEGATIVE
Urobilinogen, UA: NEGATIVE
pH, UA: 6

## 2016-01-29 LAB — CBC
HCT: 41.1 % (ref 35.0–45.0)
Hemoglobin: 13.4 g/dL (ref 11.7–15.5)
MCH: 29.9 pg (ref 27.0–33.0)
MCHC: 32.6 g/dL (ref 32.0–36.0)
MCV: 91.7 fL (ref 80.0–100.0)
MPV: 10.2 fL (ref 7.5–12.5)
PLATELETS: 247 10*3/uL (ref 140–400)
RBC: 4.48 MIL/uL (ref 3.80–5.10)
RDW: 15.2 % — AB (ref 11.0–15.0)
WBC: 4.5 10*3/uL (ref 3.8–10.8)

## 2016-01-29 LAB — LIPID PANEL
CHOL/HDL RATIO: 3.7 ratio (ref <=5.0)
CHOLESTEROL: 238 mg/dL — AB (ref 125–200)
HDL: 64 mg/dL (ref >=46)
LDL Cholesterol: 158 mg/dL — ABNORMAL HIGH (ref <130)
TRIGLYCERIDES: 79 mg/dL (ref <150)
VLDL: 16 mg/dL (ref <30)

## 2016-01-29 NOTE — Progress Notes (Signed)
50 y.o. G2P2 Married Caucasian female here for annual exam.    RA symptoms are controlled again.  Sees Rheumatology this summer.   Hot flashes are not bad.  Decreased sex drive.  PCP:  none   Patient's last menstrual period was 11/12/2014.           Sexually active: Yes.    The current method of family planning is status post hysterectomy.    Exercising: Yes.    aerobics, dance, strength Smoker:  no  Health Maintenance: Pap:  08/01/14 HR HPV neg History of abnormal Pap:  no MMG:  2009, bilateral mastectomy; see Epic Colonoscopy:  None.  She will schedule.  BMD:   09/28/13  Result  Osetopenia TDaP:  May 2012 Gardasil:   no HIV:  Negative in pregnancy.  Hep C:  Today.  Screening Labs:  Hb today: 13.0 Urine today: Normal   reports that she has never smoked. She has never used smokeless tobacco. She reports that she drinks about 0.6 oz of alcohol per week. She reports that she does not use illicit drugs.  Past Medical History  Diagnosis Date  . Arthritis   . RA (rheumatoid arthritis) ([place])   . Anemia   . Blood transfusion without reported diagnosis 1976    1976  following surgery at [facility]   . Fibroid   . History of breast cancer   . Scoliosis     tx with robaxin prn - rarely uses  . Abnormal uterine bleeding   . Environmental and seasonal allergies   . Asthma     exercised induced asthma - rarely uses inhaler  . Heart murmur     never has caused any problems  . Headache     sleeps , no meds  . Cancer ([place]) 08/2008    left breast--has bilateral mastectomy  . SVD (spontaneous vaginal delivery) 1996    x 1  . Complication of anesthesia     pt states" gets shakes" with general    Past Surgical History  Procedure Laterality Date  . Cesarean section  01/01/2004    Dr Quincy Simmonds  . Breast lumpectomy w/ needle localization  04/23/2008    w [facility] Dr Margot Chimes  . Portacath placement  05/17/2008    Dr Margot Chimes  . Mastectomy  09/11/2008    bilateral - Dr Margot Chimes  . Insertion of tissue  expander after mastectomy  09/11/2008    Dr Harlow Mares  . Port-a-cath removal  09/09/2009    Dr Margot Chimes  . Umbilical hernia repair  09/09/2009    Dr Margot Chimes  . Hysteroscopy w/d&c  07/21/2011    Procedure: DILATATION AND CURETTAGE (D&C) /HYSTEROSCOPY;  Surgeon: Arloa Koh;  Location: [facility];  Service: Gynecology;  Laterality: N/A;  with Ultrasound Guidance  . Knee arthroscopy w/ meniscal repair Right 2010    --Dr. Telford Nab  . Appendectomy    . Endometrial biopsy  06/2013, 2015    x 2  . Breast lumpectomy  05/17/2008    x 2 re excise for margin - Dr Margot Chimes, bilateral mastectomy 08/2008  . Removal of tissue expander and placement of implant  12/2008  . Wisdom tooth extraction    . Eye surgery  2003    bilateral lasik  . Left rotator cuff repair  01/2006  . Left foot surgery  2004  . Laparoscopic assisted vaginal hysterectomy N/A 01/15/2015    Procedure: LAPAROSCOPIC ASSISTED VAGINAL HYSTERECTOMY;  Surgeon: Nunzio Cobbs, MD;  Location: [facility];  Service: Gynecology;  Laterality: N/A;  . Salpingoophorectomy Bilateral 01/15/2015    Procedure: BILATERAL SALPINGO OOPHORECTOMY;  Surgeon: Nunzio Cobbs, MD;  Location: [facility];  Service: Gynecology;  Laterality: Bilateral;  . Abdominal hysterectomy      Current Outpatient Prescriptions  Medication Sig Dispense Refill  . albuterol (PROVENTIL HFA;VENTOLIN HFA) 108 (90 BASE) MCG/ACT inhaler Inhale 2 puffs into the lungs every 6 (six) hours as needed. Shortness of breath     . calcium carbonate (OS-CAL) 600 MG TABS tablet Take 600 mg by mouth 2 (two) times daily with a meal.    . cetirizine (ZYRTEC) 10 MG tablet Take 10 mg by mouth daily.      . folic acid (FOLVITE) 1 MG tablet Take 2 mg by mouth daily.    Marland Kitchen GLUCOSAMINE PO Take 2 tablets by mouth daily.     . hydroxychloroquine (PLAQUENIL) 200 MG tablet Take 200 mg by mouth 2 (two) times daily.      . Magnesium 500 MG TABS Take 1 tablet by mouth 2 (two) times daily.    . Manganese  50 MG TABS Take 1 tablet by mouth daily.    . methocarbamol (ROBAXIN) 500 MG tablet Take 500 mg by mouth every 8 (eight) hours as needed for muscle spasms.   1  . fluticasone (FLONASE) 50 MCG/ACT nasal spray Place into both nostrils daily. Reported on 01/29/2016    . OTREXUP 20 MG/0.4ML SOAJ Inject into the skin once a week.  2   No current facility-administered medications for this visit.    Family History  Problem Relation Age of Onset  . Breast cancer Mother   . Diabetes Mother   . Hypertension Mother   . Stroke Mother   . Heart failure Father   . Hypertension Father     ROS:  Pertinent items are noted in HPI.  Otherwise, a comprehensive ROS was negative.  Exam:   BP 92/58 mmHg  Pulse 76  Resp 16  Ht 5\' 6"  (1.676 m)  Wt 160 lb (72.576 kg)  BMI 25.84 kg/m2  LMP 11/12/2014    General appearance: alert, cooperative and appears stated age Head: Normocephalic, without obvious abnormality, atraumatic Neck: no adenopathy, supple, symmetrical, trachea midline and thyroid normal to inspection and palpation Lungs: clear to auscultation bilaterally Breasts: normal appearance, no masses or tenderness, consistent with bilateral mastectomy with reconstruction.  Heart: regular rate and rhythm Abdomen: incisions:  Yes.    Pfannenstiel and laparoscopic incisions , soft, non-tender; no masses, no organomegaly Extremities: extremities normal, atraumatic, no cyanosis or edema Skin: Skin color, texture, turgor normal. No rashes or lesions Lymph nodes: Cervical, supraclavicular, and axillary nodes normal. No abnormal inguinal nodes palpated Neurologic: Grossly normal  Pelvic: External genitalia:  no lesions              Urethra:  normal appearing urethra with no masses, tenderness or lesions              Bartholins and Skenes: normal                 Vagina: normal appearing vagina with normal color and discharge, no lesions              Cervix: absent              Pap taken: No. Bimanual  Exam:  Uterus:  uterus absent              Adnexa: normal adnexa and no  mass, fullness, tenderness              Rectal exam: Yes.  .  Confirms.              Anus:  normal sphincter tone, no lesions  Chaperone was present for exam.  Assessment:   Well woman visit with normal exam. Status post bilateral mastectomy with reconstruction for breast cancer.  Status post laparoscopically assisted vaginal hysterectomy with bilateral salpingo-oophorectomy.  Osteopenia.  Rheumatoid arthritis. Screening for Hep C.  Plan: Yearly mammogram recommended after age 63.  Recommended self breast exam.  Pap and HR HPV as above. Discussed Calcium, Vitamin D, regular exercise program including cardiovascular and weight bearing exercise. Labs performed.  Yes.  .   See orders.  Routine labs including Hep C. Prescription medication(s) given.  No..   BMD through Rheumatology. Follow up annually and prn.       After visit summary provided.

## 2016-01-29 NOTE — Patient Instructions (Signed)
Health Maintenance, Female Adopting a healthy lifestyle and getting preventive care can go a long way to promote health and wellness. Talk with your health care provider about what schedule of regular examinations is right for you. This is a good chance for you to check in with your provider about disease prevention and staying healthy. In between checkups, there are plenty of things you can do on your own. Experts have done a lot of research about which lifestyle changes and preventive measures are most likely to keep you healthy. Ask your health care provider for more information. WEIGHT AND DIET  Eat a healthy diet  Be sure to include plenty of vegetables, fruits, low-fat dairy products, and lean protein.  Do not eat a lot of foods high in solid fats, added sugars, or salt.  Get regular exercise. This is one of the most important things you can do for your health.  Most adults should exercise for at least 150 minutes each week. The exercise should increase your heart rate and make you sweat (moderate-intensity exercise).  Most adults should also do strengthening exercises at least twice a week. This is in addition to the moderate-intensity exercise.  Maintain a healthy weight  Body mass index (BMI) is a measurement that can be used to identify possible weight problems. It estimates body fat based on height and weight. Your health care provider can help determine your BMI and help you achieve or maintain a healthy weight.  For females 20 years of age and older:   A BMI below 18.5 is considered underweight.  A BMI of 18.5 to 24.9 is normal.  A BMI of 25 to 29.9 is considered overweight.  A BMI of 30 and above is considered obese.  Watch levels of cholesterol and blood lipids  You should start having your blood tested for lipids and cholesterol at 52 years of age, then have this test every 5 years.  You may need to have your cholesterol levels checked more often if:  Your lipid  or cholesterol levels are high.  You are older than 52 years of age.  You are at high risk for heart disease.  CANCER SCREENING   Lung Cancer  Lung cancer screening is recommended for adults 55-80 years old who are at high risk for lung cancer because of a history of smoking.  A yearly low-dose CT scan of the lungs is recommended for people who:  Currently smoke.  Have quit within the past 15 years.  Have at least a 30-pack-year history of smoking. A pack year is smoking an average of one pack of cigarettes a day for 1 year.  Yearly screening should continue until it has been 15 years since you quit.  Yearly screening should stop if you develop a health problem that would prevent you from having lung cancer treatment.  Breast Cancer  Practice breast self-awareness. This means understanding how your breasts normally appear and feel.  It also means doing regular breast self-exams. Let your health care provider know about any changes, no matter how small.  If you are in your 20s or 30s, you should have a clinical breast exam (CBE) by a health care provider every 1-3 years as part of a regular health exam.  If you are 40 or older, have a CBE every year. Also consider having a breast X-ray (mammogram) every year.  If you have a family history of breast cancer, talk to your health care provider about genetic screening.  If you   are at high risk for breast cancer, talk to your health care provider about having an MRI and a mammogram every year.  Breast cancer gene (BRCA) assessment is recommended for women who have family members with BRCA-related cancers. BRCA-related cancers include:  Breast.  Ovarian.  Tubal.  Peritoneal cancers.  Results of the assessment will determine the need for genetic counseling and BRCA1 and BRCA2 testing. Cervical Cancer Your health care provider may recommend that you be screened regularly for cancer of the pelvic organs (ovaries, uterus, and  vagina). This screening involves a pelvic examination, including checking for microscopic changes to the surface of your cervix (Pap test). You may be encouraged to have this screening done every 3 years, beginning at age 21.  For women ages 30-65, health care providers may recommend pelvic exams and Pap testing every 3 years, or they may recommend the Pap and pelvic exam, combined with testing for human papilloma virus (HPV), every 5 years. Some types of HPV increase your risk of cervical cancer. Testing for HPV may also be done on women of any age with unclear Pap test results.  Other health care providers may not recommend any screening for nonpregnant women who are considered low risk for pelvic cancer and who do not have symptoms. Ask your health care provider if a screening pelvic exam is right for you.  If you have had past treatment for cervical cancer or a condition that could lead to cancer, you need Pap tests and screening for cancer for at least 20 years after your treatment. If Pap tests have been discontinued, your risk factors (such as having a new sexual partner) need to be reassessed to determine if screening should resume. Some women have medical problems that increase the chance of getting cervical cancer. In these cases, your health care provider may recommend more frequent screening and Pap tests. Colorectal Cancer  This type of cancer can be detected and often prevented.  Routine colorectal cancer screening usually begins at 52 years of age and continues through 52 years of age.  Your health care provider may recommend screening at an earlier age if you have risk factors for colon cancer.  Your health care provider may also recommend using home test kits to check for hidden blood in the stool.  A small camera at the end of a tube can be used to examine your colon directly (sigmoidoscopy or colonoscopy). This is done to check for the earliest forms of colorectal  cancer.  Routine screening usually begins at age 50.  Direct examination of the colon should be repeated every 5-10 years through 52 years of age. However, you may need to be screened more often if early forms of precancerous polyps or small growths are found. Skin Cancer  Check your skin from head to toe regularly.  Tell your health care provider about any new moles or changes in moles, especially if there is a change in a mole's shape or color.  Also tell your health care provider if you have a mole that is larger than the size of a pencil eraser.  Always use sunscreen. Apply sunscreen liberally and repeatedly throughout the day.  Protect yourself by wearing long sleeves, pants, a wide-brimmed hat, and sunglasses whenever you are outside. HEART DISEASE, DIABETES, AND HIGH BLOOD PRESSURE   High blood pressure causes heart disease and increases the risk of stroke. High blood pressure is more likely to develop in:  People who have blood pressure in the high end   of the normal range (130-139/85-89 mm Hg).  People who are overweight or obese.  People who are African American.  If you are 38-23 years of age, have your blood pressure checked every 3-5 years. If you are 61 years of age or older, have your blood pressure checked every year. You should have your blood pressure measured twice--once when you are at a hospital or clinic, and once when you are not at a hospital or clinic. Record the average of the two measurements. To check your blood pressure when you are not at a hospital or clinic, you can use:  An automated blood pressure machine at a pharmacy.  A home blood pressure monitor.  If you are between 45 years and 39 years old, ask your health care provider if you should take aspirin to prevent strokes.  Have regular diabetes screenings. This involves taking a blood sample to check your fasting blood sugar level.  If you are at a normal weight and have a low risk for diabetes,  have this test once every three years after 52 years of age.  If you are overweight and have a high risk for diabetes, consider being tested at a younger age or more often. PREVENTING INFECTION  Hepatitis B  If you have a higher risk for hepatitis B, you should be screened for this virus. You are considered at high risk for hepatitis B if:  You were born in a country where hepatitis B is common. Ask your health care provider which countries are considered high risk.  Your parents were born in a high-risk country, and you have not been immunized against hepatitis B (hepatitis B vaccine).  You have HIV or AIDS.  You use needles to inject street drugs.  You live with someone who has hepatitis B.  You have had sex with someone who has hepatitis B.  You get hemodialysis treatment.  You take certain medicines for conditions, including cancer, organ transplantation, and autoimmune conditions. Hepatitis C  Blood testing is recommended for:  Everyone born from [birth year] through 1967.  Anyone with known risk factors for hepatitis C. Sexually transmitted infections (STIs)  You should be screened for sexually transmitted infections (STIs) including gonorrhea and chlamydia if:  You are sexually active and are younger than 52 years of age.  You are older than 53 years of age and your health care provider tells you that you are at risk for this type of infection.  Your sexual activity has changed since you were last screened and you are at an increased risk for chlamydia or gonorrhea. Ask your health care provider if you are at risk.  If you do not have HIV, but are at risk, it may be recommended that you take a prescription medicine daily to prevent HIV infection. This is called pre-exposure prophylaxis (PrEP). You are considered at risk if:  You are sexually active and do not regularly use condoms or know the HIV status of your partner(s).  You take drugs by injection.  You are sexually  active with a partner who has HIV. Talk with your health care provider about whether you are at high risk of being infected with HIV. If you choose to begin PrEP, you should first be tested for HIV. You should then be tested every 3 months for as long as you are taking PrEP.  PREGNANCY   If you are premenopausal and you may become pregnant, ask your health care provider about preconception counseling.  If you may  become pregnant, take 400 to 800 micrograms (mcg) of folic acid every day.  If you want to prevent pregnancy, talk to your health care provider about birth control (contraception). OSTEOPOROSIS AND MENOPAUSE   Osteoporosis is a disease in which the bones lose minerals and strength with aging. This can result in serious bone fractures. Your risk for osteoporosis can be identified using a bone density scan.  If you are 61 years of age or older, or if you are at risk for osteoporosis and fractures, ask your health care provider if you should be screened.  Ask your health care provider whether you should take a calcium or vitamin D supplement to lower your risk for osteoporosis.  Menopause may have certain physical symptoms and risks.  Hormone replacement therapy may reduce some of these symptoms and risks. Talk to your health care provider about whether hormone replacement therapy is right for you.  HOME CARE INSTRUCTIONS   Schedule regular health, dental, and eye exams.  Stay current with your immunizations.   Do not use any tobacco products including cigarettes, chewing tobacco, or electronic cigarettes.  If you are pregnant, do not drink alcohol.  If you are breastfeeding, limit how much and how often you drink alcohol.  Limit alcohol intake to no more than 1 drink per day for nonpregnant women. One drink equals 12 ounces of beer, 5 ounces of wine, or 1 ounces of hard liquor.  Do not use street drugs.  Do not share needles.  Ask your health care provider for help if  you need support or information about quitting drugs.  Tell your health care provider if you often feel depressed.  Tell your health care provider if you have ever been abused or do not feel safe at home.   This information is not intended to replace advice given to you by your health care provider. Make sure you discuss any questions you have with your health care provider.   Document Released: 03/23/2011 Document Revised: 09/28/2014 Document Reviewed: 08/09/2013 Elsevier Interactive Patient Education [employer].

## 2016-01-30 LAB — VITAMIN D 25 HYDROXY (VIT D DEFICIENCY, FRACTURES): Vit D, 25-Hydroxy: 62 ng/mL (ref 30–100)

## 2016-01-30 LAB — HEPATITIS C ANTIBODY: HCV Ab: NEGATIVE

## 2016-02-27 ENCOUNTER — Emergency Department (INDEPENDENT_AMBULATORY_CARE_PROVIDER_SITE_OTHER): Payer: BLUE CROSS/BLUE SHIELD

## 2016-02-27 ENCOUNTER — Emergency Department
Admission: EM | Admit: 2016-02-27 | Discharge: 2016-02-27 | Disposition: A | Discharge status: Home / Self Care | Payer: BLUE CROSS/BLUE SHIELD | Source: Home / Self Care | Attending: Family Medicine | Admitting: Family Medicine

## 2016-02-27 DIAGNOSIS — Z853 Personal history of malignant neoplasm of breast: Secondary | ICD-10-CM | POA: Diagnosis not present

## 2016-02-27 DIAGNOSIS — M62830 Muscle spasm of back: Secondary | ICD-10-CM | POA: Diagnosis not present

## 2016-02-27 DIAGNOSIS — M4804 Spinal stenosis, thoracic region: Secondary | ICD-10-CM

## 2016-02-27 DIAGNOSIS — M419 Scoliosis, unspecified: Secondary | ICD-10-CM

## 2016-02-27 DIAGNOSIS — R059 Cough, unspecified: Secondary | ICD-10-CM

## 2016-02-27 DIAGNOSIS — R05 Cough: Secondary | ICD-10-CM

## 2016-02-27 DIAGNOSIS — Z8739 Personal history of other diseases of the musculoskeletal system and connective tissue: Secondary | ICD-10-CM

## 2016-02-27 DIAGNOSIS — R0981 Nasal congestion: Secondary | ICD-10-CM

## 2016-02-27 DIAGNOSIS — M47819 Spondylosis without myelopathy or radiculopathy, site unspecified: Secondary | ICD-10-CM

## 2016-02-27 MED ORDER — METHOCARBAMOL 500 MG PO TABS: 500.0000 mg | ORAL_TABLET | Freq: Two times a day (BID) | ORAL | Status: DC

## 2016-02-27 MED ORDER — PREDNISONE 20 MG PO TABS: ORAL_TABLET | ORAL | Status: DC

## 2016-02-27 MED ORDER — HYDROCODONE-ACETAMINOPHEN 5-325 MG PO TABS: 1.0000 | ORAL_TABLET | Freq: Four times a day (QID) | ORAL | Status: DC | PRN

## 2016-02-27 NOTE — ED Provider Notes (Signed)
CSN: [id number]     Arrival date & time 02/27/16  1042 History   First MD Initiated Contact with Patient 02/27/16 1132     Chief Complaint  Patient presents with  . Spasms   (Consider location/radiation/quality/duration/timing/severity/associated sxs/prior Treatment) Patient is a 50 y.o. female presenting with back pain. The history is provided by the patient.  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) and twisting (twisting or other certain movements exacerbate pain)   Context: not falling, not lifting heavy objects, not recent illness and not recent injury   Relieved by:  Being still and ibuprofen (she is on methotrexate and plaquenil so tries to limit NSAIDs but did take 2 Advil last night) Worsened by:  Twisting and movement Ineffective treatments:  Bed rest and muscle relaxants (pt had expired Robaxin that usually works ) Associated symptoms: no abdominal pain, no chest pain, no dysuria, no fever, no headaches, no paresthesias and no weakness   Associated symptoms comment:  Also c/o mild nasal congestion and mild non-productive cough Risk factors: hx of cancer, hx of osteoporosis and steroid use   Risk factors: no recent surgery   Risk factors comment:  Hx of severe scoliosis    Past Medical History  Diagnosis Date  . Arthritis   . RA (rheumatoid arthritis) ([place])   . Anemia   . Blood transfusion without reported diagnosis 1976    1976  following surgery at [facility]   . Fibroid   . History of breast cancer   . Scoliosis     tx with robaxin prn - rarely uses  .  Abnormal uterine bleeding   . Environmental and seasonal allergies   . Asthma     exercised induced asthma - rarely uses inhaler  . Heart murmur     never has caused any problems  . Headache     sleeps , no meds  . Cancer ([place]) 08/2008    left breast--has bilateral mastectomy  . SVD (spontaneous vaginal delivery) 1996    x 1  . Complication of anesthesia     pt states" gets shakes" with general   Past Surgical History  Procedure Laterality Date  . Cesarean section  01/01/2004    Dr Quincy Simmonds  . Breast lumpectomy w/ needle localization  04/23/2008    w [facility] Dr Margot Chimes  . Portacath placement  05/17/2008    Dr Margot Chimes  . Mastectomy  09/11/2008    bilateral - Dr Margot Chimes  . Insertion of tissue expander after mastectomy  09/11/2008    Dr Harlow Mares  . Port-a-cath removal  09/09/2009    Dr Margot Chimes  . Umbilical hernia repair  09/09/2009    Dr Margot Chimes  . Hysteroscopy w/d&c  07/21/2011    Procedure: DILATATION AND CURETTAGE (D&C) /HYSTEROSCOPY;  Surgeon: Arloa Koh;  Location: [facility];  Service: Gynecology;  Laterality: N/A;  with Ultrasound Guidance  . Knee arthroscopy w/ meniscal repair Right 2010    --Dr. Telford Nab  . Appendectomy    . Endometrial biopsy  06/2013, 2015    x 2  . Breast lumpectomy  05/17/2008    x 2 re excise for margin - Dr Margot Chimes, bilateral mastectomy 08/2008  . Removal of tissue expander and placement of implant  12/2008  . Wisdom tooth extraction    . Eye surgery  2003    bilateral lasik  . Left rotator cuff repair  01/2006  . Left foot surgery  2004  . Laparoscopic assisted vaginal hysterectomy N/A 01/15/2015    Procedure: LAPAROSCOPIC ASSISTED VAGINAL HYSTERECTOMY;  Surgeon: Nunzio Cobbs, MD;  Location: [facility];  Service: Gynecology;  Laterality: N/A;  . Salpingoophorectomy Bilateral 01/15/2015    Procedure: BILATERAL SALPINGO OOPHORECTOMY;  Surgeon: Nunzio Cobbs, MD;  Location: [facility];  Service: Gynecology;  Laterality: Bilateral;  . Abdominal  hysterectomy     Family History  Problem Relation Age of Onset  . Breast cancer Mother   . Diabetes Mother   . Hypertension Mother   . Stroke Mother   . Heart failure Father   . Hypertension Father    Social History  Substance Use Topics  . Smoking status: Never Smoker   . Smokeless tobacco: Never Used  . Alcohol Use: 0.6 oz/week    1 Standard drinks or equivalent per week     Comment: occ glass of wine or beer   OB History    Gravida Para Term Preterm AB TAB SAB Ectopic Multiple Living   2 2       1 3      Review of Systems  Constitutional: Negative for fever, chills and appetite change.  Cardiovascular: Negative for chest pain and palpitations.  Gastrointestinal: Negative for nausea, vomiting, abdominal pain and diarrhea.  Genitourinary: Negative for dysuria, hematuria and flank pain.  Musculoskeletal: Positive for myalgias and back pain. Negative for joint swelling, arthralgias, gait problem, neck pain and neck stiffness.  Skin: Negative for color change and rash.  Neurological: Negative for weakness, headaches and paresthesias.    Allergies  Latex; Other; and Percocet  Home Medications   Prior to Admission medications   Medication Sig Start Date End Date Taking? Authorizing Provider  albuterol (PROVENTIL HFA;VENTOLIN HFA) 108 (90 BASE) MCG/ACT inhaler Inhale 2 puffs into the lungs every 6 (six) hours as needed. Shortness of breath     Historical Provider, MD  calcium carbonate (OS-CAL) 600 MG TABS tablet Take 600 mg by mouth 2 (two) times daily with a meal.    Historical Provider, MD  cetirizine (ZYRTEC) 10 MG tablet Take 10 mg by mouth daily.      Historical Provider, MD  fluticasone (FLONASE) 50 MCG/ACT nasal spray Place into both nostrils daily. Reported on 01/29/2016    Historical Provider, MD  folic acid (FOLVITE) 1 MG tablet Take 2 mg by mouth daily.    Historical Provider, MD  GLUCOSAMINE PO Take 2 tablets by mouth daily.     Historical Provider, MD   HYDROcodone-acetaminophen (NORCO/VICODIN) 5-325 MG tablet Take 1-2 tablets by mouth every 6 (six) hours as needed for moderate pain or severe pain. 02/27/16   Noland Fordyce, PA-C  hydroxychloroquine (PLAQUENIL) 200 MG tablet Take 200 mg by mouth 2 (two) times daily.      Historical Provider, MD  Magnesium 500 MG TABS Take 1 tablet by mouth 2 (two) times daily.    Historical Provider, MD  Manganese 50 MG TABS Take 1 tablet by mouth daily.    Historical Provider, MD  methocarbamol (ROBAXIN) 500 MG tablet Take 500 mg by mouth every 8 (eight) hours as needed  for muscle spasms.  12/11/14   Historical Provider, MD  methocarbamol (ROBAXIN) 500 MG tablet Take 1 tablet (500 mg total) by mouth 2 (two) times daily. 02/27/16   Noland Fordyce, PA-C  OTREXUP 20 MG/0.4ML SOAJ Inject into the skin once a week. 11/27/15   Historical Provider, MD  predniSONE (DELTASONE) 20 MG tablet 3 tabs po day one, then 2 po daily x 4 days 02/27/16   Noland Fordyce, PA-C   Meds Ordered and Administered this Visit  Medications - No data to display  BP 124/72 mmHg  Pulse 66  Temp(Src) 97.5 F (36.4 C) (Oral)  Ht 5' 6.5" (1.689 m)  Wt 156 lb 12 oz (71.101 kg)  BMI 24.92 kg/m2  SpO2 96%  LMP 11/12/2014 No data found.   Physical Exam  Constitutional: She appears well-developed and well-nourished. No distress.  HENT:  Head: Normocephalic and atraumatic.  Right Ear: Tympanic membrane normal.  Left Ear: Tympanic membrane normal.  Nose: Nose normal.  Mouth/Throat: Uvula is midline, oropharynx is clear and moist and mucous membranes are normal.  Eyes: Conjunctivae are normal. No scleral icterus.  Neck: Normal range of motion. Neck supple.  Cardiovascular: Normal rate, regular rhythm and normal heart sounds.   Pulmonary/Chest: Effort normal and breath sounds normal. No respiratory distress. She has no wheezes. She has no rales.  Abdominal: Soft. She exhibits no distension. There is no tenderness.  Musculoskeletal: Normal range of  motion. She exhibits no edema or tenderness.  Severe scoliosis noted on exam. No point tenderness to spine or surrounding muscles. Full ROM upper and lower extremities bilaterally.   Neurological: She is alert.  Normal gait.  Skin: Skin is warm and dry. No rash noted. She is not diaphoretic. No erythema.  Nursing note and vitals reviewed.   ED Course  Procedures (including critical care time)  Labs Review Labs Reviewed - No data to display  Imaging Review Dg Chest 2 View  02/27/2016  CLINICAL DATA:  Cough since Friday, nasal congestions, fatigue. EXAM: CHEST  2 VIEW COMPARISON:  12/26/2012 FINDINGS: Severe sigmoid scoliosis thoracolumbar spine stable. Small hiatal hernia. Heart size and vascular pattern are normal. Lungs are clear. No pleural effusion. IMPRESSION: No acute abnormalities. Electronically Signed   By: Skipper Cliche M.D.   On: 02/27/2016 12:21   Dg Thoracic Spine 2 View  02/27/2016  CLINICAL DATA:  Dorsalgia.  History of breast carcinoma EXAM: THORACIC SPINE 3 VIEWS COMPARISON:  Chest radiograph December 26, 2012 FINDINGS: Frontal, lateral, and swimmer's views were obtained. There is mid thoracic dextroscoliosis with thoracolumbar levoscoliosis. There is no fracture or spondylolisthesis. There is mild disc space narrowing at multiple levels with multiple small anterior osteophytes. There is no erosive change or paraspinous lesion. No blastic or lytic bone lesions are evident. Degenerative changes noted in the lower cervical spine. IMPRESSION: Scoliosis. Mild disc space narrowing at multiple levels. No erosive change. No fracture or spondylolisthesis. No blastic or lytic bone lesions evident. Electronically Signed   By: Lowella Grip III M.D.   On: 02/27/2016 12:23   Dg Lumbar Spine Complete  02/27/2016  CLINICAL DATA:  Lumbago with muscle spasm. History of breast carcinoma EXAM: LUMBAR SPINE - COMPLETE 4+ VIEW COMPARISON:  Lumbar MRI November 08, 2012 FINDINGS: Frontal, lateral,  spot lumbosacral lateral, and bilateral oblique views were obtained. The there are 5 non-rib-bearing lumbar type vertebral bodies. There is lumbar levoscoliosis with rotatory component. There is no fracture or spondylolisthesis. There is disc space narrowing at multiple sites, exacerbated  by the scoliosis. There is disc degeneration along the inferior aspect of the L3 vertebral body toward the right. No erosive change. No blastic or lytic bone lesions. There is facet osteoarthritic change at L4-5 and L5-S1 bilaterally. IMPRESSION: Scoliosis with secondary osteoarthritic change. Degenerative change is most notable on the right at L3-4. No fracture or spondylolisthesis. No blastic or lytic bone lesions evident. Electronically Signed   By: Lowella Grip III M.D.   On: 02/27/2016 12:25      MDM   1. Spasm of back muscles   2. Cough   3. Nasal congestion   4. History of breast cancer   5. History of scoliosis   6. Osteoarthritis of thoracolumbar spine, unspecified spinal osteoarthritis    Pt c/o mid to lower back pain. No known injury. Mild intermittent nasal congestion with non-productive cough. Hx of breast cancer.  No hx of back surgeries.  Pt appears well, non-toxic. Afebrile.  Plain films: no acute findings. Scoliosis and OA noted on plain films. No blastic or lytic bone lesions evidence.  CXR: normal  Reviewed imaging with pt, reassured no new findings. Rx: Robaxin, norco for severe pain and at night, prednisone for 5 days (60mg  day 1, 40mg  day 2-5). Discuss starting prednisone pack with provider who prescribes her Plaquenil. If symptoms not improving in 1 week f/u with PCP, oncology, or orthopedist, sooner if worsening. Patient verbalized understanding and agreement with treatment plan.     Noland Fordyce, PA-C 02/27/16 1307

## 2016-02-27 NOTE — Discharge Instructions (Signed)
Norco/Vicodin (hydrocodone-acetaminophen) is a narcotic pain medication, do not combine these medications with others containing tylenol. While taking, do not drink alcohol, drive, or perform any other activities that requires focus while taking these medications.   You may want to call your provider who prescribes your Plaquenil before starting the 5 day course of oral prednisone.    Robaxin is a muscle relaxer and may cause drowsiness. Do not drink alcohol, drive, or operate heavy machinery while taking.

## 2016-02-27 NOTE — ED Notes (Signed)
Started Friday night into Saturday morning, between shoulder blades.  Also has started with a non productive cough. History of scoliosis, so has spasms occasionally, but this is worse.  Has been working at the computer mor frequently, and is wondering if that could be contributing.  Pain level is a 5 right now, but when spasms, it increases to a 10.

## 2016-03-13 ENCOUNTER — Encounter: Payer: Self-pay | Admitting: Genetic Counselor

## 2016-06-27 ENCOUNTER — Other Ambulatory Visit: Payer: Self-pay | Admitting: Radiology

## 2016-06-27 DIAGNOSIS — Z79899 Other long term (current) drug therapy: Secondary | ICD-10-CM

## 2016-09-09 DIAGNOSIS — H52203 Unspecified astigmatism, bilateral: Secondary | ICD-10-CM

## 2016-09-09 DIAGNOSIS — H524 Presbyopia: Secondary | ICD-10-CM

## 2016-09-09 DIAGNOSIS — H35433 Paving stone degeneration of retina, bilateral: Secondary | ICD-10-CM | POA: Insufficient documentation

## 2016-09-09 DIAGNOSIS — H5213 Myopia, bilateral: Secondary | ICD-10-CM | POA: Insufficient documentation

## 2016-09-11 ENCOUNTER — Other Ambulatory Visit: Payer: Self-pay | Admitting: Rheumatology

## 2016-09-11 NOTE — Telephone Encounter (Addendum)
Last Visit: 05/18/16 Next Visit due January 2018. Message sent to the front to schedule patient. Labs: 05/18/16 WNL. Patient advised she is due to update labs. Patient will try to come by today to update labs.   Okay to refill Otrexup?

## 2016-09-11 NOTE — Telephone Encounter (Signed)
ok 

## 2016-09-18 ENCOUNTER — Other Ambulatory Visit: Payer: Self-pay | Admitting: Radiology

## 2016-09-18 DIAGNOSIS — Z79899 Other long term (current) drug therapy: Secondary | ICD-10-CM

## 2016-09-18 LAB — CBC WITH DIFFERENTIAL/PLATELET
BASOS ABS: 0 {cells}/uL (ref 0–200)
Basophils Relative: 0 %
Eosinophils Absolute: 305 cells/uL (ref 15–500)
Eosinophils Relative: 5 %
HEMATOCRIT: 37.6 % (ref 35.0–45.0)
HEMOGLOBIN: 12.3 g/dL (ref 11.7–15.5)
LYMPHS ABS: 1647 {cells}/uL (ref 850–3900)
Lymphocytes Relative: 27 %
MCH: 29.8 pg (ref 27.0–33.0)
MCHC: 32.7 g/dL (ref 32.0–36.0)
MCV: 91 fL (ref 80.0–100.0)
MONO ABS: 549 {cells}/uL (ref 200–950)
MPV: 10.4 fL (ref 7.5–12.5)
Monocytes Relative: 9 %
NEUTROS PCT: 59 %
Neutro Abs: 3599 cells/uL (ref 1500–7800)
Platelets: 219 10*3/uL (ref 140–400)
RBC: 4.13 MIL/uL (ref 3.80–5.10)
RDW: 15 % (ref 11.0–15.0)
WBC: 6.1 10*3/uL (ref 3.8–10.8)

## 2016-09-18 LAB — COMPLETE METABOLIC PANEL WITH GFR
ALBUMIN: 4 g/dL (ref 3.6–5.1)
ALK PHOS: 58 U/L (ref 33–130)
ALT: 15 U/L (ref 6–29)
AST: 23 U/L (ref 10–35)
BILIRUBIN TOTAL: 0.5 mg/dL (ref 0.2–1.2)
BUN: 15 mg/dL (ref 7–25)
CALCIUM: 9.4 mg/dL (ref 8.6–10.4)
CO2: 27 mmol/L (ref 20–31)
Chloride: 107 mmol/L (ref 98–110)
Creat: 0.82 mg/dL (ref 0.50–1.05)
GFR, Est African American: >89 mL/min (ref >=60)
GFR, Est Non African American: 83 mL/min (ref >=60)
Glucose, Bld: 84 mg/dL (ref 65–99)
POTASSIUM: 4.5 mmol/L (ref 3.5–5.3)
Sodium: 142 mmol/L (ref 135–146)
TOTAL PROTEIN: 6.2 g/dL (ref 6.1–8.1)

## 2016-09-20 NOTE — Progress Notes (Signed)
Labs normal.

## 2016-09-22 ENCOUNTER — Telehealth: Payer: Self-pay | Admitting: Radiology

## 2016-09-22 NOTE — Telephone Encounter (Signed)
-----   Message from Bo Merino, MD sent at 09/20/2016  7:24 PM EST ----- Labs normal

## 2016-09-22 NOTE — Telephone Encounter (Signed)
Line bus

## 2016-10-03 IMAGING — DX DG THORACIC SPINE 2V
3 series · 3 of 3 positions shown · non-contrast
Comparison: Chest radiograph December 26, 2012

CLINICAL DATA: Dorsalgia.  History of breast carcinoma

EXAM:
THORACIC SPINE 3 VIEWS

[t-spine ap]
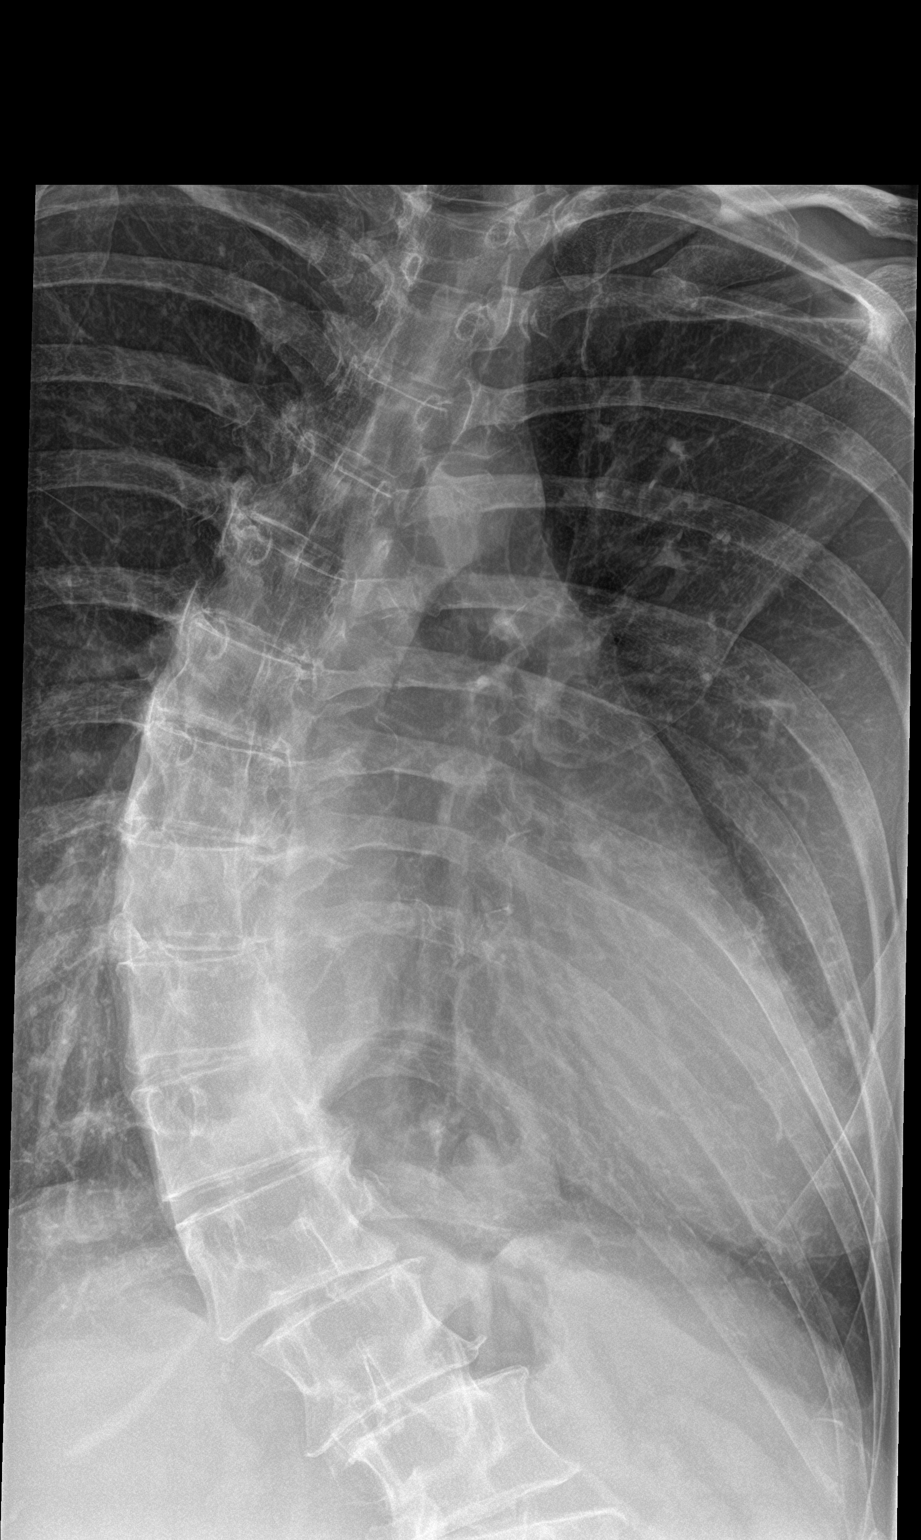

[t-spine lat]
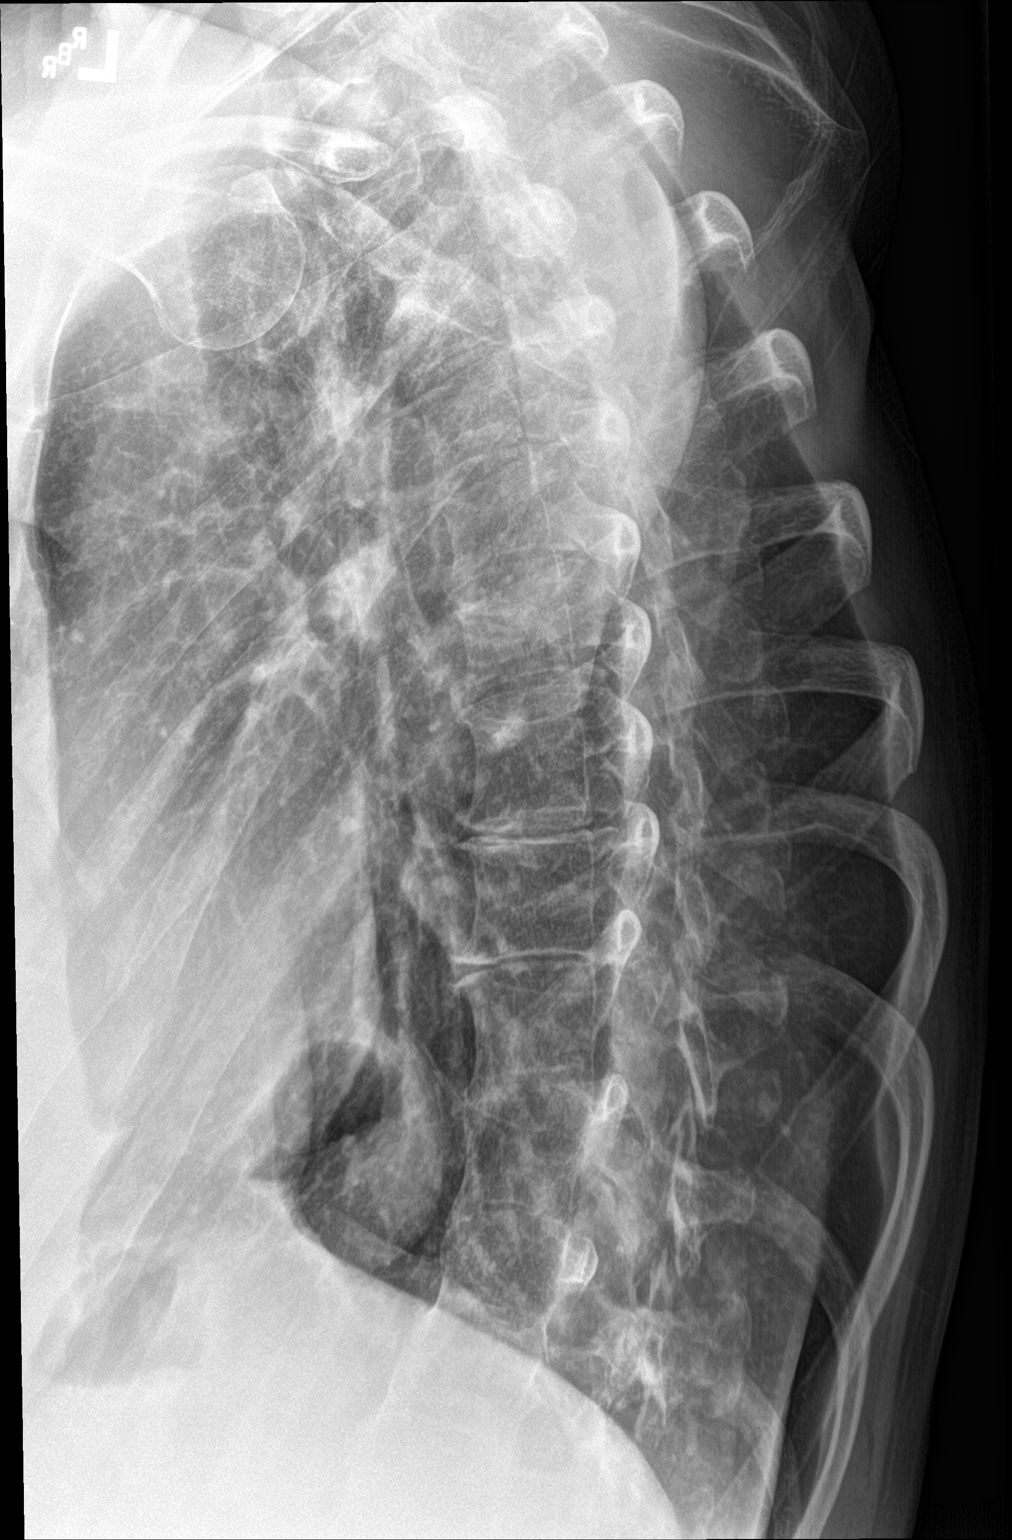

[t-spine swimmers]
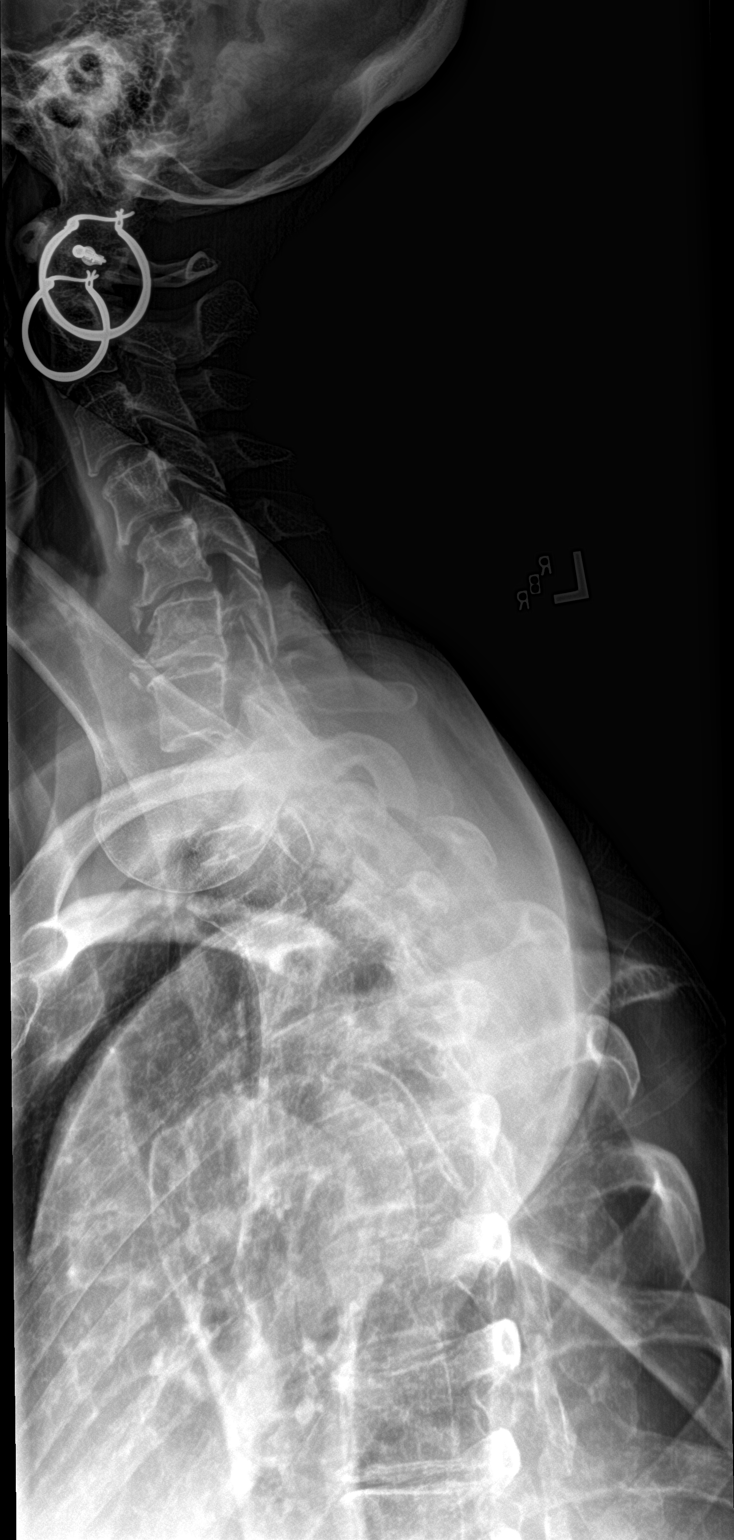

[3 of 3 positions shown; findings below may reference images not displayed]

FINDINGS: Frontal, lateral, and swimmer's views were obtained. There is mid
thoracic dextroscoliosis with thoracolumbar levoscoliosis. There is
no fracture or spondylolisthesis. There is mild disc space narrowing
at multiple levels with multiple small anterior osteophytes. There
is no erosive change or paraspinous lesion. No blastic or lytic bone
lesions are evident. Degenerative changes noted in the lower
cervical spine.
IMPRESSION: Scoliosis. Mild disc space narrowing at multiple levels. No erosive
change. No fracture or spondylolisthesis. No blastic or lytic bone
lesions evident.

## 2016-10-03 IMAGING — DX DG LUMBAR SPINE COMPLETE 4+V
5 series · 5 of 5 positions shown · non-contrast
Comparison: Lumbar MRI November 08, 2012

CLINICAL DATA: Lumbago with muscle spasm. History of breast
carcinoma

EXAM:
LUMBAR SPINE - COMPLETE 4+ VIEW

[l-spine ap]
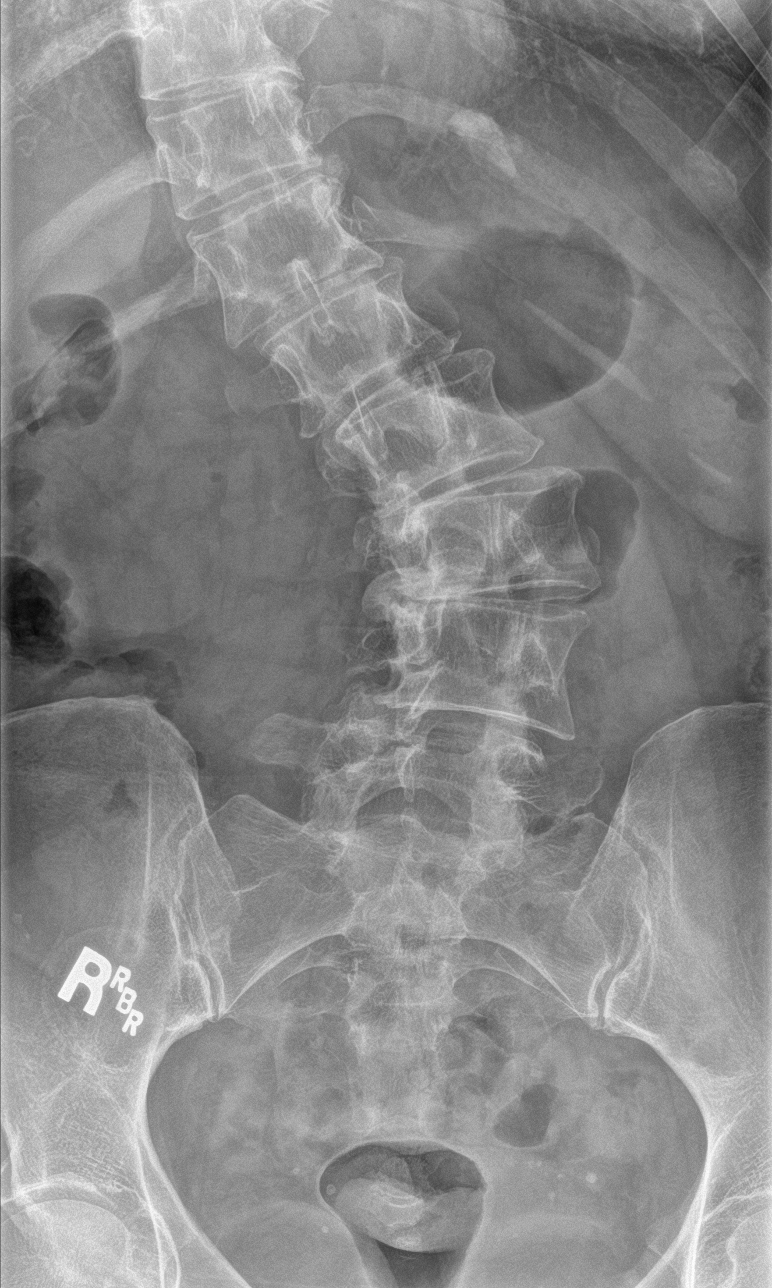

[l-spine obl (1 of 2)]
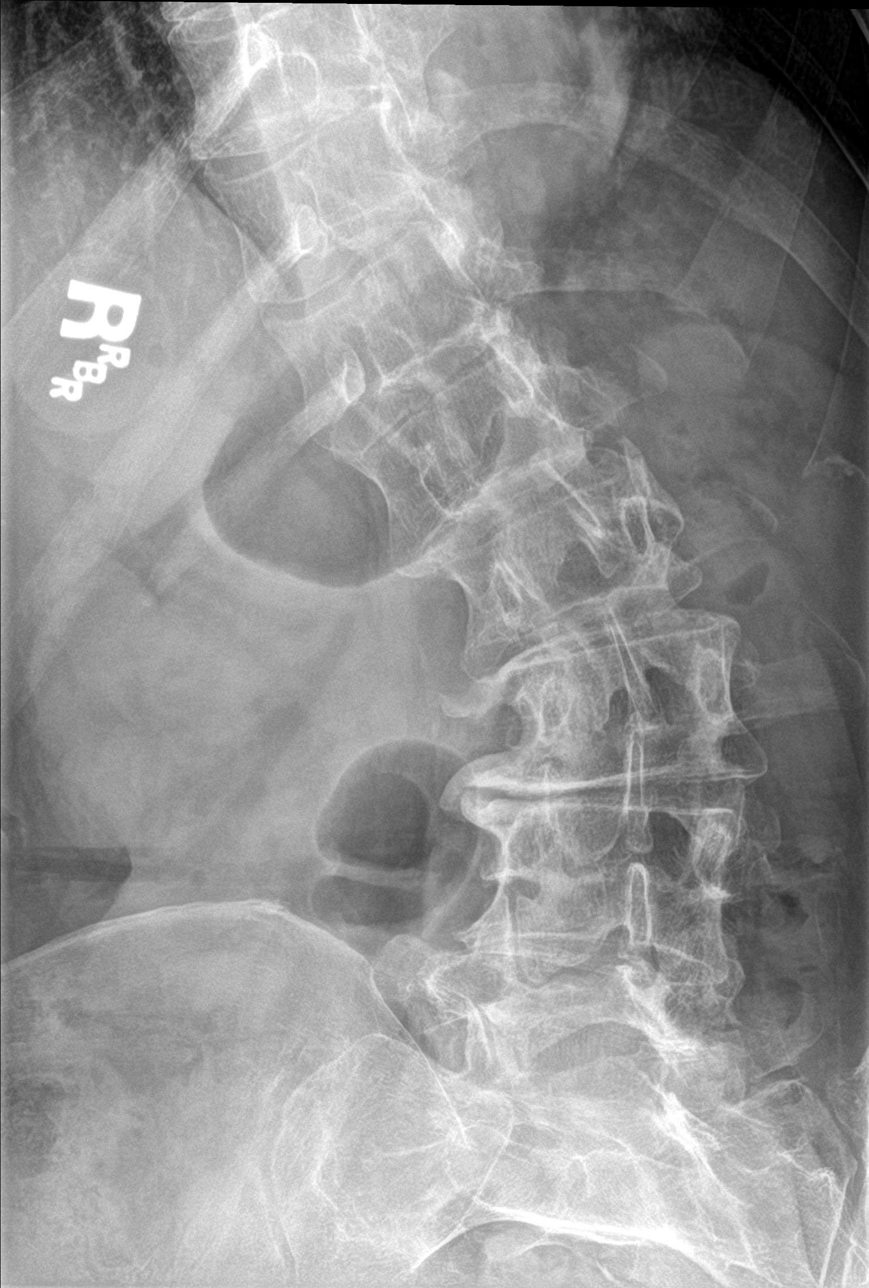

[l-spine obl (2 of 2)]
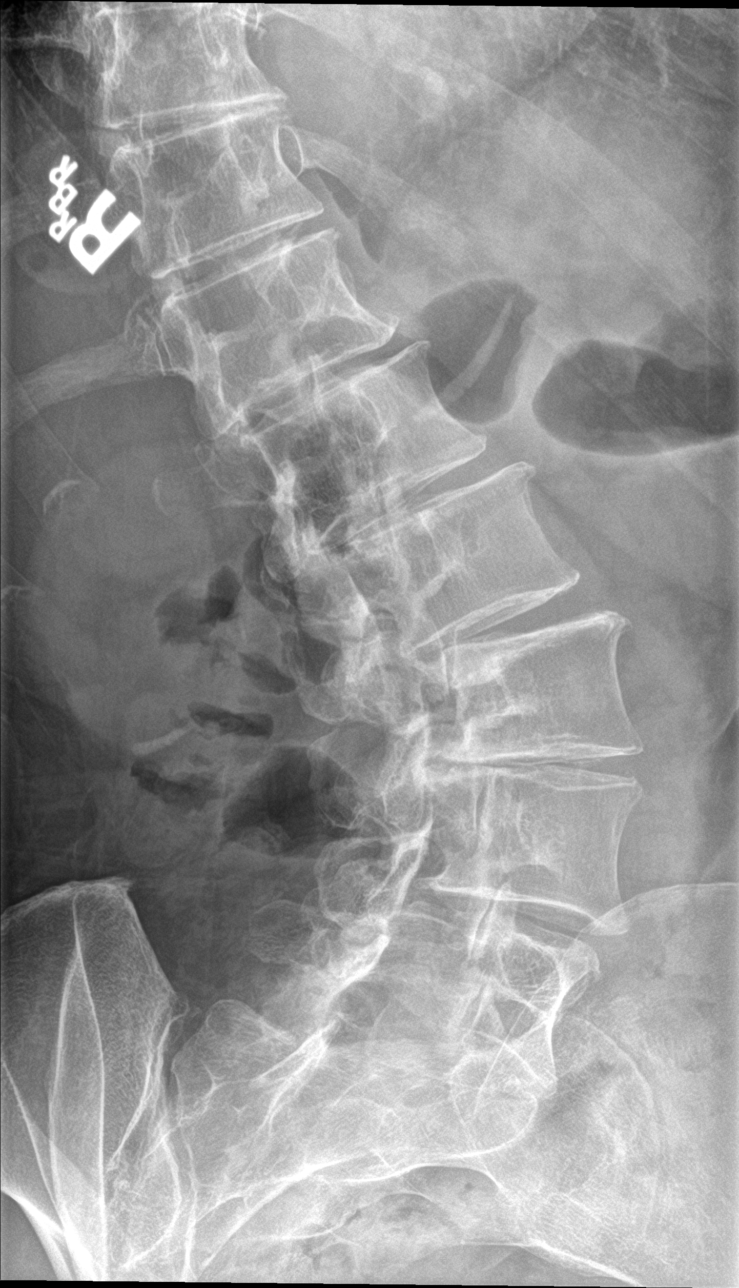

[l-spine lat]
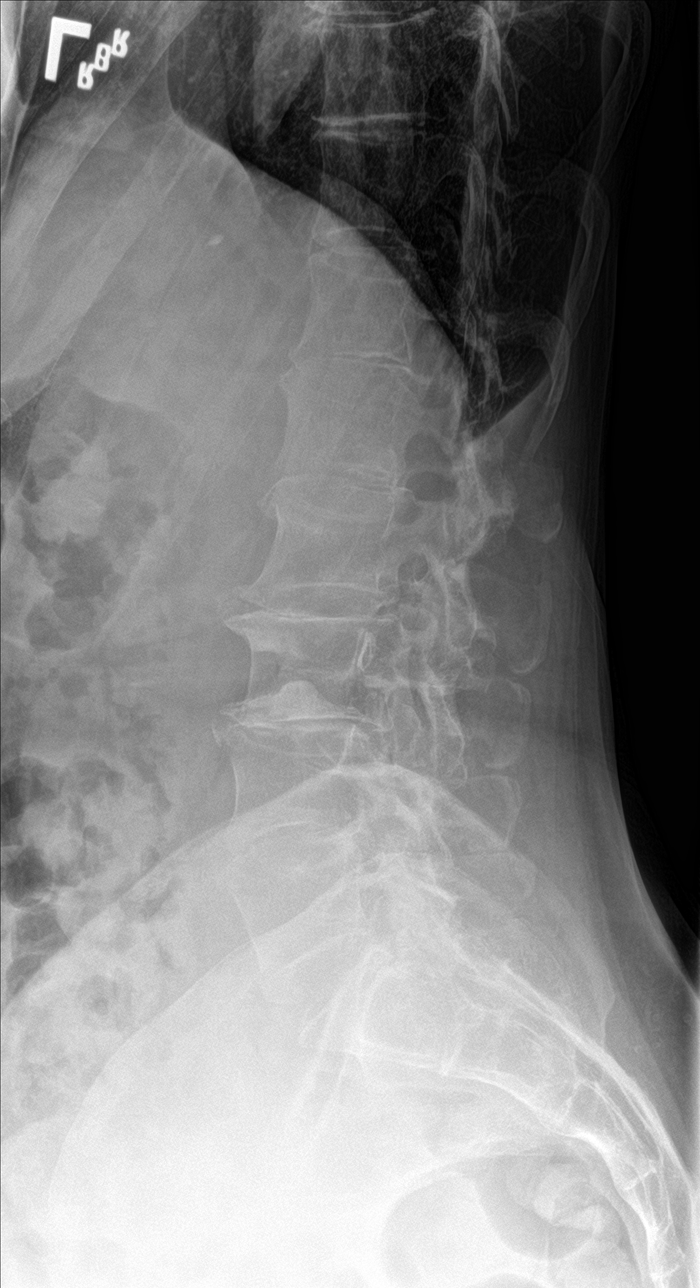

[l-spine spot]
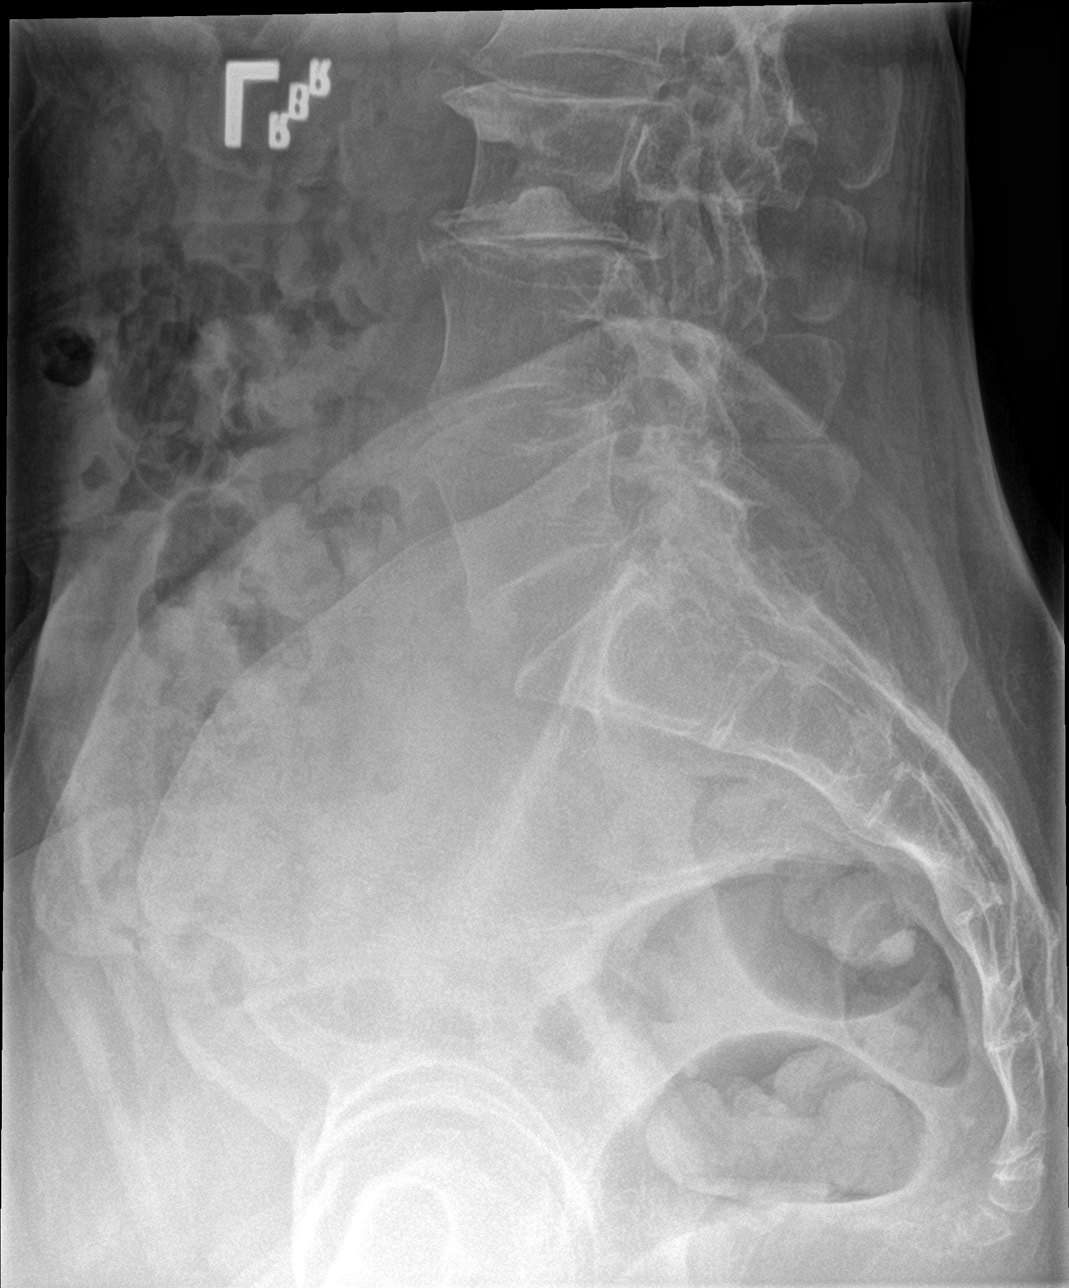

[5 of 5 positions shown; findings below may reference images not displayed]

FINDINGS: Frontal, lateral, spot lumbosacral lateral, and bilateral oblique
views were obtained. The there are 5 non-rib-bearing lumbar type
vertebral bodies. There is lumbar levoscoliosis with rotatory
component. There is no fracture or spondylolisthesis. There is disc
space narrowing at multiple sites, exacerbated by the scoliosis.
There is disc degeneration along the inferior aspect of the L3
vertebral body toward the right. No erosive change. No blastic or
lytic bone lesions. There is facet osteoarthritic change at L4-5 and
L5-S1 bilaterally.
IMPRESSION: Scoliosis with secondary osteoarthritic change. Degenerative change
is most notable on the right at L3-4. No fracture or
spondylolisthesis. No blastic or lytic bone lesions evident.

## 2016-11-11 DIAGNOSIS — M19042 Primary osteoarthritis, left hand: Secondary | ICD-10-CM | POA: Insufficient documentation

## 2016-11-11 DIAGNOSIS — M19041 Primary osteoarthritis, right hand: Secondary | ICD-10-CM | POA: Insufficient documentation

## 2016-11-11 DIAGNOSIS — M7711 Lateral epicondylitis, right elbow: Secondary | ICD-10-CM | POA: Insufficient documentation

## 2016-11-11 DIAGNOSIS — Z79899 Other long term (current) drug therapy: Secondary | ICD-10-CM | POA: Insufficient documentation

## 2016-11-11 NOTE — [place] Notes (Signed)
Office Visit Note  Patient: Rebecca Fleming             Date of Birth: 09/10/1966           MRN: [id number]             PCP: No PCP Per Patient Referring: No ref. provider found Visit Date: 11/12/2016 Occupation: _0 @    Subjective:  Follow-up Rheumatoid arthritis  History of Present Illness: Rebecca Fleming is a 51 y.o. female  Doing well currently. Patient is getting very nauseous with her methotrexate injections. So much so that she had to stop taking her Otrexup for the last 5 weeks or so. Despite being off of the medications for that long, she did not have any flares. She continued to take the Plaquenil 200 mg twice a day Monday through Friday.  No joint pain swelling and stiffness. Patient's morning stiffness is less than 5 minutes.   Activities of Daily Living:  Patient reports morning stiffness for 5 minutes.   Patient Denies nocturnal pain.  Difficulty dressing/grooming: Denies Difficulty climbing stairs: Denies Difficulty getting out of chair: Denies Difficulty using hands for taps, buttons, cutlery, and/or writing: Denies   Review of Systems  Constitutional: Negative for fatigue.  HENT: Negative for mouth sores and mouth dryness.   Eyes: Negative for dryness.  Respiratory: Negative for shortness of breath.   Gastrointestinal: Negative for constipation and diarrhea.  Musculoskeletal: Negative for myalgias and myalgias.  Skin: Negative for sensitivity to sunlight.  Psychiatric/Behavioral: Negative for decreased concentration and sleep disturbance.    PMFS History:  Patient Active Problem List   Diagnosis Date Noted  . High risk medication use 11/11/2016  . Primary osteoarthritis of both hands 11/11/2016  . Lateral epicondylitis, right elbow 11/11/2016  . Status post laparoscopic assisted vaginal hysterectomy (LAVH) 01/15/2015  . Breast cancer, left breast ([place]) 11/07/2014  . Postmenopausal bleeding 07/13/2013  . Rheumatoid arthritis ([place]) 01/11/2012    . Psoriasis 01/11/2012    Past Medical History:  Diagnosis Date  . Abnormal uterine bleeding   . Anemia   . Arthritis   . Asthma    exercised induced asthma - rarely uses inhaler  . Blood transfusion without reported diagnosis 1976   1976  following surgery at [facility]   . Cancer ([place]) 08/2008   left breast--has bilateral mastectomy  . Complication of anesthesia    pt states" gets shakes" with general  . Environmental and seasonal allergies   . Fibroid   . Headache    sleeps , no meds  . Heart murmur    never has caused any problems  . History of breast cancer   . RA (rheumatoid arthritis) ([place])   . Scoliosis    tx with robaxin prn - rarely uses  . SVD (spontaneous vaginal delivery) 1996   x 1    Family History  Problem Relation Age of Onset  . Breast cancer Mother   . Diabetes Mother   . Hypertension Mother   . Stroke Mother   . Heart failure Father   . Hypertension Father    Past Surgical History:  Procedure Laterality Date  . ABDOMINAL HYSTERECTOMY    . APPENDECTOMY    . BREAST LUMPECTOMY  05/17/2008   x 2 re excise for margin - Dr Margot Chimes, bilateral mastectomy 08/2008  . BREAST LUMPECTOMY W/ NEEDLE LOCALIZATION  04/23/2008   w SLN Dr Margot Chimes  . CESAREAN SECTION  01/01/2004   Dr Quincy Simmonds  . ENDOMETRIAL BIOPSY  06/2013, 2015   x 2  . EYE SURGERY  2003   bilateral lasik  . HYSTEROSCOPY W/D&C  07/21/2011   Procedure: DILATATION AND CURETTAGE (D&C) /HYSTEROSCOPY;  Surgeon: Brook A Silva;  Location: [facility];  Service: Gynecology;  Laterality: N/A;  with Ultrasound Guidance  . INSERTION OF TISSUE EXPANDER AFTER MASTECTOMY  09/11/2008   Dr Bowers  . KNEE ARTHROSCOPY W/ MENISCAL REPAIR Right 2010   --Dr. Rendall  . LAPAROSCOPIC ASSISTED VAGINAL HYSTERECTOMY N/A 01/15/2015   Procedure: LAPAROSCOPIC ASSISTED VAGINAL HYSTERECTOMY;  Surgeon: Brook E Amundson C Silva, MD;  Location: [facility];  Service: Gynecology;  Laterality: N/A;  . left foot surgery  2004  . left rotator cuff  repair  01/2006  . MASTECTOMY  09/11/2008   bilateral - Dr Streck  . PORT-A-CATH REMOVAL  09/09/2009   Dr Streck  . PORTACATH PLACEMENT  05/17/2008   Dr Streck  . REMOVAL OF TISSUE EXPANDER AND PLACEMENT OF IMPLANT  12/2008  . SALPINGOOPHORECTOMY Bilateral 01/15/2015   Procedure: BILATERAL SALPINGO OOPHORECTOMY;  Surgeon: Brook E Amundson C Silva, MD;  Location: [facility];  Service: Gynecology;  Laterality: Bilateral;  . UMBILICAL HERNIA REPAIR  09/09/2009   Dr Streck  . WISDOM TOOTH EXTRACTION     Social History   Social History Narrative  . No narrative on file     Objective: Vital Signs: BP 107/64   Pulse 72   Resp 13   Ht 5' 7" (1.702 m)   Wt 159 lb (72.1 kg)   LMP 11/12/2014   BMI 24.90 kg/m    Physical Exam  Constitutional: She is oriented to person, place, and time. She appears well-developed and well-nourished.  HENT:  Head: Normocephalic and atraumatic.  Eyes: EOM are normal. Pupils are equal, round, and reactive to light.  Cardiovascular: Normal rate, regular rhythm and normal heart sounds.  Exam reveals no gallop and no friction rub.   No murmur heard. Pulmonary/Chest: Effort normal and breath sounds normal. She has no wheezes. She has no rales.  Abdominal: Soft. Bowel sounds are normal. She exhibits no distension. There is no tenderness. There is no guarding. No hernia.  Musculoskeletal: Normal range of motion. She exhibits no edema, tenderness or deformity.  Lymphadenopathy:    She has no cervical adenopathy.  Neurological: She is alert and oriented to person, place, and time. Coordination normal.  Skin: Skin is warm and dry. Capillary refill takes less than 2 seconds. No rash noted.  Psychiatric: She has a normal mood and affect. Her behavior is normal.     Musculoskeletal Exam:  Full range of motion of all joints Grip strength is equal and strong bilaterally Fibromyalgia tender points are all absent  CDAI Exam: CDAI Homunculus Exam:   Joint Counts:    CDAI Tender Joint count: 0 CDAI Swollen Joint count: 0  Global Assessments:  Patient Global Assessment: 0 Provider Global Assessment: 0  No synovitis  Investigation: Findings:  Labs are normal from August 2017 per patient.  RAPID 3 shows a raw score of 6.3 with an index of 2.0, which is consistent with low severity.  Plaquenil eye exam is normal and up-to-date as of December 2016 through Dr. Evans and will be due December 2017.   Orders Only on 09/18/2016  Component Date Value Ref Range Status  . WBC 09/18/2016 6.1  3.8 - 10.8 K/uL Final  . RBC 09/18/2016 4.13  3.80 - 5.10 MIL/uL Final  . Hemoglobin 09/18/2016 12.3  11.7 - 15.5 g/dL   Final  . HCT 09/18/2016 37.6  35.0 - 45.0 % Final  . MCV 09/18/2016 91.0  80.0 - 100.0 fL Final  . MCH 09/18/2016 29.8  27.0 - 33.0 pg Final  . MCHC 09/18/2016 32.7  32.0 - 36.0 g/dL Final  . RDW 09/18/2016 15.0  11.0 - 15.0 % Final  . Platelets 09/18/2016 219  140 - 400 K/uL Final  . MPV 09/18/2016 10.4  7.5 - 12.5 fL Final  . Neutro Abs 09/18/2016 3599  1,500 - 7,800 cells/uL Final  . Lymphs Abs 09/18/2016 1647  850 - 3,900 cells/uL Final  . Monocytes Absolute 09/18/2016 549  200 - 950 cells/uL Final  . Eosinophils Absolute 09/18/2016 305  15 - 500 cells/uL Final  . Basophils Absolute 09/18/2016 0  0 - 200 cells/uL Final  . Neutrophils Relative % 09/18/2016 59  % Final  . Lymphocytes Relative 09/18/2016 27  % Final  . Monocytes Relative 09/18/2016 9  % Final  . Eosinophils Relative 09/18/2016 5  % Final  . Basophils Relative 09/18/2016 0  % Final  . Smear Review 09/18/2016 Criteria for review not met   Final  . Sodium 09/18/2016 142  135 - 146 mmol/L Final  . Potassium 09/18/2016 4.5  3.5 - 5.3 mmol/L Final  . Chloride 09/18/2016 107  98 - 110 mmol/L Final  . CO2 09/18/2016 27  20 - 31 mmol/L Final  . Glucose, Bld 09/18/2016 84  65 - 99 mg/dL Final  . BUN 09/18/2016 15  7 - 25 mg/dL Final  . Creat 09/18/2016 0.82  0.50 - 1.05 mg/dL Final    Comment:   For patients > or = 53 years of age: The upper reference limit for Creatinine is approximately 13% higher for people identified as African-American.     . Total Bilirubin 09/18/2016 0.5  0.2 - 1.2 mg/dL Final  . Alkaline Phosphatase 09/18/2016 58  33 - 130 U/L Final  . AST 09/18/2016 23  10 - 35 U/L Final  . ALT 09/18/2016 15  6 - 29 U/L Final  . Total Protein 09/18/2016 6.2  6.1 - 8.1 g/dL Final  . Albumin 09/18/2016 4.0  3.6 - 5.1 g/dL Final  . Calcium 09/18/2016 9.4  8.6 - 10.4 mg/dL Final  . GFR, Est African American 09/18/2016 >89  >=60 mL/min Final  . GFR, Est Non African American 09/18/2016 83  >=60 mL/min Final      Imaging: No results found.  Speciality Comments: No specialty comments available.    Procedures:  No procedures performed Allergies: Latex; Other; and Percocet [oxycodone-acetaminophen]   Assessment / Plan:     Visit Diagnoses: Rheumatoid arthritis involving multiple sites with positive rheumatoid factor (HCC) - +CCP  High risk medication use - Otrexup 27m q wk (very nauseous) ==> (128mnow as trial x 5 weeks to address nausea)Plaquenil 200 BID x 7 days/wk; eye exam normal - Plan: CBC with Differential/Platelet, COMPLETE METABOLIC PANEL WITH GFR  Primary osteoarthritis of both hands  Lateral epicondylitis, right elbow - resolved  Nauseated - during MTX injections x 2 days   Plan: #1: Rheumatoid arthritis. No joint pain swelling and stiffness.  #2: High-risk prescription. On Otrexup 20 mg every week. Patient is having no flares despite being off of the medication for about 5 weeks. The Otrexup is making the patient quite nauseous. Due to the nausea we had moved her from oral methotrexate to injectable methotrexate. The odor from the injectable methotrexate still patent made the patient nauseous.  She is doing much better with the injectable methotrexate because of odor aspect is gone but she still feels quite nauseous.  We will offer  patient Zofran 4 mg up to 3 times a day when necessary dispense 40 pills with 3 refills  Patient is also on Plaquenil 200 mg twice a day 7 days a week. (Patient is above 5 foot 7 inches tall and this is the adequate dose for the patient).  I also wonder if patient would benefit from a slightly lower dose of Otrexup. She has been off of the medication for 5 weeks during the dead of winter and she has not had any flares whatsoever. If she is doing this well without a flare, perhaps she will have less nausea side effect from lower dose of Otrexup at 17.5 mg or even 44m  #3: CBC with differential and CMP with GFR every 3 months starting today The last labs were December 2017 and were normal  #4: Patient had lateral epicondylitis in her right arm at the last visit. It is all resolved now.  #5: Return to clinic in 5 months    Orders: Orders Placed This Encounter  Procedures  . CBC with Differential/Platelet  . COMPLETE METABOLIC PANEL WITH GFR   Meds ordered this encounter  Medications  . ondansetron (ZOFRAN) 4 MG tablet    Sig: Take 1 tablet (4 mg total) by mouth every 8 (eight) hours as needed for nausea or vomiting.    Dispense:  40 tablet    Refill:  4    Order Specific Question:   Supervising Provider    Answer:   DBo Merino[[id number]]  . hydroxychloroquine (PLAQUENIL) 200 MG tablet    Sig: Take 1 tablet (200 mg total) by mouth 2 (two) times daily.    Dispense:  180 tablet    Refill:  1    Face-to-face time spent with patient was 30 minutes. 50% of time was spent in counseling and coordination of care.  Follow-Up Instructions: Return in about 5 months (around 04/11/2017) for RA // mtx 229m// plq 200 mg bid x 7d/wk // nausea.   NaEliezer LoftsPA-C  Note - This record has been created using [employer] Chart creation errors have been sought, but may not always  have been located. Such creation errors do not reflect on  the standard of medical care.

## 2016-11-12 ENCOUNTER — Encounter: Payer: Self-pay | Admitting: Rheumatology

## 2016-11-12 ENCOUNTER — Other Ambulatory Visit: Payer: Self-pay

## 2016-11-12 ENCOUNTER — Ambulatory Visit (INDEPENDENT_AMBULATORY_CARE_PROVIDER_SITE_OTHER): Payer: BLUE CROSS/BLUE SHIELD | Admitting: Rheumatology

## 2016-11-12 VITALS — BP 107/64 | HR 72 | Resp 13 | Ht 67.0 in | Wt 159.0 lb

## 2016-11-12 DIAGNOSIS — Z79899 Other long term (current) drug therapy: Secondary | ICD-10-CM

## 2016-11-12 DIAGNOSIS — M7711 Lateral epicondylitis, right elbow: Secondary | ICD-10-CM

## 2016-11-12 DIAGNOSIS — R110 Nausea: Secondary | ICD-10-CM

## 2016-11-12 DIAGNOSIS — M19041 Primary osteoarthritis, right hand: Secondary | ICD-10-CM | POA: Diagnosis not present

## 2016-11-12 DIAGNOSIS — M19042 Primary osteoarthritis, left hand: Secondary | ICD-10-CM | POA: Diagnosis not present

## 2016-11-12 DIAGNOSIS — M0579 Rheumatoid arthritis with rheumatoid factor of multiple sites without organ or systems involvement: Secondary | ICD-10-CM

## 2016-11-12 LAB — CBC WITH DIFFERENTIAL/PLATELET
BASOS PCT: 1 %
Basophils Absolute: 61 cells/uL (ref 0–200)
EOS ABS: 183 {cells}/uL (ref 15–500)
Eosinophils Relative: 3 %
HCT: 39.5 % (ref 35.0–45.0)
Hemoglobin: 13.2 g/dL (ref 11.7–15.5)
Lymphocytes Relative: 27 %
Lymphs Abs: 1647 cells/uL (ref 850–3900)
MCH: 29.6 pg (ref 27.0–33.0)
MCHC: 33.4 g/dL (ref 32.0–36.0)
MCV: 88.6 fL (ref 80.0–100.0)
MONO ABS: 427 {cells}/uL (ref 200–950)
MPV: 10.3 fL (ref 7.5–12.5)
Monocytes Relative: 7 %
NEUTROS ABS: 3782 {cells}/uL (ref 1500–7800)
Neutrophils Relative %: 62 %
Platelets: 228 10*3/uL (ref 140–400)
RBC: 4.46 MIL/uL (ref 3.80–5.10)
RDW: 14.6 % (ref 11.0–15.0)
WBC: 6.1 10*3/uL (ref 3.8–10.8)

## 2016-11-12 LAB — COMPLETE METABOLIC PANEL WITH GFR
ALT: 13 U/L (ref 6–29)
AST: 21 U/L (ref 10–35)
Albumin: 4.2 g/dL (ref 3.6–5.1)
Alkaline Phosphatase: 58 U/L (ref 33–130)
BUN: 14 mg/dL (ref 7–25)
CO2: 25 mmol/L (ref 20–31)
Calcium: 9.8 mg/dL (ref 8.6–10.4)
Chloride: 104 mmol/L (ref 98–110)
Creat: 0.89 mg/dL (ref 0.50–1.05)
GFR, Est African American: 86 mL/min (ref >=60)
GFR, Est Non African American: 75 mL/min (ref >=60)
Glucose, Bld: 91 mg/dL (ref 65–99)
Potassium: 4.3 mmol/L (ref 3.5–5.3)
Sodium: 140 mmol/L (ref 135–146)
Total Bilirubin: 0.5 mg/dL (ref 0.2–1.2)
Total Protein: 6.9 g/dL (ref 6.1–8.1)

## 2016-11-12 MED ORDER — HYDROXYCHLOROQUINE SULFATE 200 MG PO TABS: 200.0000 mg | ORAL_TABLET | Freq: Two times a day (BID) | ORAL | 1 refills | Status: DC

## 2016-11-12 MED ORDER — ONDANSETRON HCL 4 MG PO TABS: 4.0000 mg | ORAL_TABLET | Freq: Three times a day (TID) | ORAL | 4 refills | Status: DC | PRN

## 2016-11-13 NOTE — [place] Notes (Signed)
Labs are normal.

## 2016-11-16 ENCOUNTER — Ambulatory Visit: Payer: BLUE CROSS/BLUE SHIELD | Admitting: Rheumatology

## 2017-01-30 ENCOUNTER — Other Ambulatory Visit: Payer: Self-pay | Admitting: Rheumatology

## 2017-02-01 NOTE — Telephone Encounter (Signed)
ok 

## 2017-02-01 NOTE — Telephone Encounter (Signed)
Last Visit: 11/12/16 Next Visit: 04/12/17  Okay to refill Folic Acid?

## 2017-02-01 NOTE — [place] Notes (Signed)
51 y.o. G2P2 Married Caucasian female here for annual exam.    RA is well controlled.  MTX make her nauseous.   Decreased libido.   Just put their dog down this week.   PCP: None  Patient's last menstrual period was 11/12/2014.           Sexually active: Yes.   female The current method of family planning is status post hysterectomy.   Had BSO.  Exercising: Yes.    Dance aerobics and strength Smoker:  no  Health Maintenance: Pap: 08-01-14 Neg:Neg HR HPV;07-21-13 Neg:Neg HR HPV History of abnormal Pap:  no MMG:  2009 Bil.mastectomy Colonoscopy: NEVER.  Knows she needs to proceed.  BMD:   09-28-13  Result: [place]. TDaP:  01/2011 Gardasil:   no HIV: Negative in Pregnancy Hep C: 01-29-16 Negative Screening Labs:  Hb today: 13.0, Urine today: not done   reports that she has never smoked. She has never used smokeless tobacco. She reports that she drinks about 1.2 oz of alcohol per week . She reports that she does not use drugs.  Past Medical History:  Diagnosis Date  . Abnormal uterine bleeding   . Anemia   . Arthritis   . Asthma    exercised induced asthma - rarely uses inhaler  . Blood transfusion without reported diagnosis 1976   1976  following surgery at [facility]   . Cancer ([place]) 08/2008   left breast--has bilateral mastectomy  . Complication of anesthesia    pt states" gets shakes" with general  . Environmental and seasonal allergies   . Fibroid   . Headache    sleeps , no meds  . Heart murmur    never has caused any problems  . History of breast cancer   . RA (rheumatoid arthritis) ([place])   . Scoliosis    tx with robaxin prn - rarely uses  . SVD (spontaneous vaginal delivery) 1996   x 1    Past Surgical History:  Procedure Laterality Date  . ABDOMINAL HYSTERECTOMY    . APPENDECTOMY    . BREAST LUMPECTOMY  05/17/2008   x 2 re excise for margin - Dr Margot Chimes, bilateral mastectomy 08/2008  . BREAST LUMPECTOMY W/ NEEDLE LOCALIZATION  04/23/2008   w  SLN Dr Margot Chimes  . CESAREAN SECTION  01/01/2004   Dr Quincy Simmonds  . ENDOMETRIAL BIOPSY  06/2013, 2015   x 2  . EYE SURGERY  2003   bilateral lasik  . HYSTEROSCOPY W/D&C  07/21/2011   Procedure: DILATATION AND CURETTAGE (D&C) /HYSTEROSCOPY;  Surgeon: Arloa Koh;  Location: [facility];  Service: Gynecology;  Laterality: N/A;  with Ultrasound Guidance  . INSERTION OF TISSUE EXPANDER AFTER MASTECTOMY  09/11/2008   Dr Harlow Mares  . KNEE ARTHROSCOPY W/ MENISCAL REPAIR Right 2010   --Dr. Telford Nab  . LAPAROSCOPIC ASSISTED VAGINAL HYSTERECTOMY N/A 01/15/2015   Procedure: LAPAROSCOPIC ASSISTED VAGINAL HYSTERECTOMY;  Surgeon: Nunzio Cobbs, MD;  Location: [facility];  Service: Gynecology;  Laterality: N/A;  . left foot surgery  2004  . left rotator cuff repair  01/2006  . MASTECTOMY  09/11/2008   bilateral - Dr Margot Chimes  . PORT-A-CATH REMOVAL  09/09/2009   Dr Margot Chimes  . PORTACATH PLACEMENT  05/17/2008   Dr Margot Chimes  . REMOVAL OF TISSUE EXPANDER AND PLACEMENT OF IMPLANT  12/2008  . SALPINGOOPHORECTOMY Bilateral 01/15/2015   Procedure: BILATERAL SALPINGO OOPHORECTOMY;  Surgeon: Nunzio Cobbs, MD;  Location: [facility];  Service: Gynecology;  Laterality: Bilateral;  . UMBILICAL HERNIA REPAIR  09/09/2009   Dr Margot Chimes  . WISDOM TOOTH EXTRACTION      Current Outpatient Prescriptions  Medication Sig Dispense Refill  . albuterol (PROVENTIL HFA;VENTOLIN HFA) 108 (90 BASE) MCG/ACT inhaler Inhale 2 puffs into the lungs every 6 (six) hours as needed. Shortness of breath     . calcium carbonate (OS-CAL) 600 MG TABS tablet Take 600 mg by mouth 2 (two) times daily with a meal.    . cetirizine (ZYRTEC) 10 MG tablet Take 10 mg by mouth daily.      . fluticasone (FLONASE) 50 MCG/ACT nasal spray Place into both nostrils daily. Reported on 2/92/4462    . folic acid (FOLVITE) 1 MG tablet TAKE TWO TABLETS BY MOUTH DAILY 180 tablet 2  . GLUCOSAMINE PO Take 2 tablets by mouth daily.     . hydroxychloroquine (PLAQUENIL) 200  MG tablet Take 1 tablet (200 mg total) by mouth 2 (two) times daily. 180 tablet 1  . Magnesium 500 MG TABS Take 1 tablet by mouth 2 (two) times daily.    . Manganese 50 MG TABS Take 1 tablet by mouth daily.    . methocarbamol (ROBAXIN) 500 MG tablet Take 1 tablet (500 mg total) by mouth 2 (two) times daily. 20 tablet 0  . Methotrexate, PF, (OTREXUP) 15 MG/0.4ML SOAJ Inject 15 mg into the skin once a week.    . ondansetron (ZOFRAN) 4 MG tablet Take 1 tablet (4 mg total) by mouth every 8 (eight) hours as needed for nausea or vomiting. 40 tablet 4  . Polyethyl Glycol-Propyl Glycol 0.4-0.3 % SOLN Place 1 drop into both eyes 2 times daily.     No current facility-administered medications for this visit.     Family History  Problem Relation Age of Onset  . Breast cancer Mother   . Diabetes Mother   . Hypertension Mother   . Stroke Mother   . Heart failure Father   . Hypertension Father     ROS:  Pertinent items are noted in HPI.  Otherwise, a comprehensive ROS was negative.  Exam:   BP 100/60 (BP Location: Right Arm, Patient Position: Sitting, Cuff Size: Normal)   Pulse 60   Resp 14   Ht 5\' 6"  (1.676 m)   Wt 159 lb 3.2 oz (72.2 kg)   LMP 11/12/2014   BMI 25.70 kg/m     General appearance: alert, cooperative and appears stated age Head: Normocephalic, without obvious abnormality, atraumatic Neck: no adenopathy, supple, symmetrical, trachea midline and thyroid normal to inspection and palpation Lungs: clear to auscultation bilaterally Breasts: absent bilaterally with reconstruction.  Heart: regular rate and rhythm Abdomen: soft, non-tender; no masses, no organomegaly Extremities: extremities normal, atraumatic, no cyanosis or edema Skin: Skin color, texture, turgor normal. No rashes or lesions Lymph nodes: Cervical, supraclavicular, and axillary nodes normal. No abnormal inguinal nodes palpated Neurologic: Grossly normal  Pelvic: External genitalia:  no lesions               Urethra:  normal appearing urethra with no masses, tenderness or lesions              Bartholins and Skenes: normal                 Vagina: normal appearing vagina with normal color and discharge, no lesions              Cervix:  Absent.  Pap taken: No. Bimanual Exam:  Uterus: absent.              Adnexa: no mass, fullness, tenderness              Rectal exam: Yes.  .  Confirms.              Anus:  normal sphincter tone, no lesions  Chaperone was present for exam.  Assessment:   Well woman visit with normal exam. Status post bilateral mastectomy with reconstruction.  Status post LAVH/BSO.  Osteopenia.  RA.  Plan: Mammogram screening discussed. Recommended self breast awareness. Pap and HR HPV as above. Guidelines for Calcium, Vitamin D, regular exercise program including cardiovascular and weight bearing exercise. Routine labs.  Follow up annually and prn.  After visit summary provided.

## 2017-02-03 ENCOUNTER — Encounter: Payer: Self-pay | Admitting: Obstetrics and Gynecology

## 2017-02-03 ENCOUNTER — Ambulatory Visit (INDEPENDENT_AMBULATORY_CARE_PROVIDER_SITE_OTHER): Payer: BLUE CROSS/BLUE SHIELD | Admitting: Obstetrics and Gynecology

## 2017-02-03 VITALS — BP 100/60 | HR 60 | Resp 14 | Ht 66.0 in | Wt 159.2 lb

## 2017-02-03 DIAGNOSIS — Z01419 Encounter for gynecological examination (general) (routine) without abnormal findings: Secondary | ICD-10-CM | POA: Diagnosis not present

## 2017-02-03 DIAGNOSIS — Z0000 Encounter for general adult medical examination without abnormal findings: Secondary | ICD-10-CM | POA: Diagnosis not present

## 2017-02-03 LAB — COMPREHENSIVE METABOLIC PANEL
ALK PHOS: 62 U/L (ref 33–130)
ALT: 14 U/L (ref 6–29)
AST: 17 U/L (ref 10–35)
Albumin: 4 g/dL (ref 3.6–5.1)
BUN: 12 mg/dL (ref 7–25)
CO2: 25 mmol/L (ref 20–31)
CREATININE: 0.79 mg/dL (ref 0.50–1.05)
Calcium: 9.4 mg/dL (ref 8.6–10.4)
Chloride: 106 mmol/L (ref 98–110)
Glucose, Bld: 64 mg/dL — ABNORMAL LOW (ref 65–99)
POTASSIUM: 4.4 mmol/L (ref 3.5–5.3)
SODIUM: 141 mmol/L (ref 135–146)
TOTAL PROTEIN: 6.5 g/dL (ref 6.1–8.1)
Total Bilirubin: 0.7 mg/dL (ref 0.2–1.2)

## 2017-02-03 LAB — HEMOGLOBIN, FINGERSTICK: HEMOGLOBIN, FINGERSTICK: 13 g/dL (ref 12.0–15.0)

## 2017-02-03 LAB — CBC
HCT: 39.9 % (ref 35.0–45.0)
Hemoglobin: 13.2 g/dL (ref 11.7–15.5)
MCH: 29.8 pg (ref 27.0–33.0)
MCHC: 33.1 g/dL (ref 32.0–36.0)
MCV: 90.1 fL (ref 80.0–100.0)
MPV: 10 fL (ref 7.5–12.5)
PLATELETS: 217 10*3/uL (ref 140–400)
RBC: 4.43 MIL/uL (ref 3.80–5.10)
RDW: 14.9 % (ref 11.0–15.0)
WBC: 4 10*3/uL (ref 3.8–10.8)

## 2017-02-03 LAB — LIPID PANEL
Cholesterol: 223 mg/dL — ABNORMAL HIGH (ref <200)
HDL: 62 mg/dL (ref >50)
LDL CALC: 148 mg/dL — AB (ref <100)
TRIGLYCERIDES: 67 mg/dL (ref <150)
Total CHOL/HDL Ratio: 3.6 Ratio (ref <5.0)
VLDL: 13 mg/dL (ref <30)

## 2017-02-03 LAB — TSH: TSH: 0.89 m[IU]/L

## 2017-02-03 NOTE — Patient Instructions (Signed)

## 2017-02-04 ENCOUNTER — Encounter: Payer: Self-pay | Admitting: Obstetrics and Gynecology

## 2017-02-04 LAB — VITAMIN D 25 HYDROXY (VIT D DEFICIENCY, FRACTURES): Vit D, 25-Hydroxy: 64 ng/mL (ref 30–100)

## 2017-03-30 NOTE — Progress Notes (Signed)
Office Visit Note  Patient: Rebecca Fleming             Date of Birth: Mar 23, 1966           MRN: [id number]             PCP: Patient, No Pcp Per Referring: No ref. provider found Visit Date: 03/31/2017 Occupation: '@GUAROCC' @    Subjective:  No chief complaint on file.  History of Present Illness: Rebecca Fleming is a 51 y.o. female  Was last seen in our office on 11/12/2016 for rheumatoid arthritis. Patient is getting adequate response with methotrexate 20 mg every week and folic acid 40m qd; Plaquenil 200 mg twice a day 7 days. She reported feeling nauseous with injectable methotrexate and therefore we offered the patient Zofran to take along with the injectable methotrexate. On that visit, we discussed the patient moving down to 15 mg of methotrexate since she was doing very well with the 20 mg of methotrexate. I gave her 3:15 milligram Otrexup sample injections.   Today, patient reports that she is doing really well with the methotrexate 15 mg. She uses her sample Otrexup and has not had any problems with her rheumatoid arthritis. She is also using Plaquenil 200 mg twice a day every day. Patient admits that on some occasions, she only uses 1 methotrexate and every 2 weeks. She recognizes that that is not a good idea and she will try to use it once a week.  Patient's main issue today is actually her knees. She reports that about 2 weeks ago, she began having right knee pain. It became swollen and she was unable to bend it. Later that evening she heard a pop and the swelling went away and she was able to have some of her flexion act in her right knee. She's not sure what caused it but she did report that she walked about 20,000 steps that day.  In addition, patient is also complaining of left knee joint pain that started about 6 weeks ago. She states that it feels like "got a catch in there". She states that it only hurts when she goes up and down the stairs and when she flexes her knee a  certain way to get into bed. Otherwise it really doesn't bother her. She suspects that there could be a torn meniscus tear but of course she does not know.  She is a patient of Dr. WDurward Fortesand will most likely follow-up with him. However, she asks if we can do preliminary investigation. I offered her an x-ray of both knees today and she is agreeable. If the x-rays do not show anything, I will be happy to order an MRI for her left knee. Patient is agreeable with this. Note: She is able to ambulate well on her legs with no pain to the knees with normal walking. It is with certain maneuvers and movements and flexion that causes her knees to hurt (especially her left knee).   Activities of Daily Living:  Patient reports morning stiffness for 15 minutes.   Patient Reports nocturnal pain.  Difficulty dressing/grooming: Denies Difficulty climbing stairs: Reports Difficulty getting out of chair: Reports Difficulty using hands for taps, buttons, cutlery, and/or writing: Denies   Review of Systems  Constitutional: Negative for fatigue.  HENT: Negative for mouth sores and mouth dryness.   Eyes: Negative for dryness.  Respiratory: Negative for shortness of breath.   Gastrointestinal: Negative for constipation and diarrhea.  Musculoskeletal: Positive for arthralgias (  left > right knee pain) and joint pain (left > right knee pain). Negative for gait problem, joint swelling, myalgias and myalgias.  Skin: Negative for sensitivity to sunlight.  Psychiatric/Behavioral: Negative for decreased concentration and sleep disturbance.    PMFS History:  Patient Active Problem List   Diagnosis Date Noted  . High risk medication use 11/11/2016  . Primary osteoarthritis of both hands 11/11/2016  . Lateral epicondylitis, right elbow 11/11/2016  . Status post laparoscopic assisted vaginal hysterectomy (LAVH) 01/15/2015  . Breast cancer, left breast ([place]) 11/07/2014  . Postmenopausal bleeding 07/13/2013  .  Rheumatoid arthritis ([place]) 01/11/2012  . Psoriasis 01/11/2012    Past Medical History:  Diagnosis Date  . Abnormal uterine bleeding   . Anemia   . Arthritis   . Asthma    exercised induced asthma - rarely uses inhaler  . Blood transfusion without reported diagnosis 1976   1976  following surgery at [facility]   . Cancer ([place]) 08/2008   left breast--has bilateral mastectomy  . Complication of anesthesia    pt states" gets shakes" with general  . Elevated LDL cholesterol level   . Environmental and seasonal allergies   . Fibroid   . Headache    sleeps , no meds  . Heart murmur    never has caused any problems  . History of breast cancer   . RA (rheumatoid arthritis) ([place])   . Scoliosis    tx with robaxin prn - rarely uses  . SVD (spontaneous vaginal delivery) 1996   x 1    Family History  Problem Relation Age of Onset  . Breast cancer Mother   . Diabetes Mother   . Hypertension Mother   . Stroke Mother   . Heart failure Father   . Hypertension Father    Past Surgical History:  Procedure Laterality Date  . ABDOMINAL HYSTERECTOMY    . APPENDECTOMY    . BREAST LUMPECTOMY  05/17/2008   x 2 re excise for margin - Dr Margot Chimes, bilateral mastectomy 08/2008  . BREAST LUMPECTOMY W/ NEEDLE LOCALIZATION  04/23/2008   w [facility] Dr Margot Chimes  . CESAREAN SECTION  01/01/2004   Dr Quincy Simmonds  . ENDOMETRIAL BIOPSY  06/2013, 2015   x 2  . EYE SURGERY  2003   bilateral lasik  . HYSTEROSCOPY W/D&C  07/21/2011   Procedure: DILATATION AND CURETTAGE (D&C) /HYSTEROSCOPY;  Surgeon: Arloa Koh;  Location: [facility];  Service: Gynecology;  Laterality: N/A;  with Ultrasound Guidance  . INSERTION OF TISSUE EXPANDER AFTER MASTECTOMY  09/11/2008   Dr Harlow Mares  . KNEE ARTHROSCOPY W/ MENISCAL REPAIR Right 2010   --Dr. Telford Nab  . LAPAROSCOPIC ASSISTED VAGINAL HYSTERECTOMY N/A 01/15/2015   Procedure: LAPAROSCOPIC ASSISTED VAGINAL HYSTERECTOMY;  Surgeon: Nunzio Cobbs, MD;  Location: [facility];  Service:  Gynecology;  Laterality: N/A;  . left foot surgery  2004  . left rotator cuff repair  01/2006  . MASTECTOMY  09/11/2008   bilateral - Dr Margot Chimes  . PORT-A-CATH REMOVAL  09/09/2009   Dr Margot Chimes  . PORTACATH PLACEMENT  05/17/2008   Dr Margot Chimes  . REMOVAL OF TISSUE EXPANDER AND PLACEMENT OF IMPLANT  12/2008  . SALPINGOOPHORECTOMY Bilateral 01/15/2015   Procedure: BILATERAL SALPINGO OOPHORECTOMY;  Surgeon: Nunzio Cobbs, MD;  Location: [facility];  Service: Gynecology;  Laterality: Bilateral;  . UMBILICAL HERNIA REPAIR  09/09/2009   Dr Margot Chimes  . [place] EXTRACTION     Social History   Social  History Narrative  . No narrative on file     Objective: Vital Signs: LMP 11/12/2014    Physical Exam  Constitutional: She is oriented to person, place, and time. She appears well-developed and well-nourished.  HENT:  Head: Normocephalic and atraumatic.  Eyes: EOM are normal. Pupils are equal, round, and reactive to light.  Cardiovascular: Normal rate, regular rhythm and normal heart sounds.  Exam reveals no gallop and no friction rub.   No murmur heard. Pulmonary/Chest: Effort normal and breath sounds normal. She has no wheezes. She has no rales.  Abdominal: Soft. Bowel sounds are normal. She exhibits no distension. There is no tenderness. There is no guarding. No hernia.  Musculoskeletal: Normal range of motion. She exhibits no edema, tenderness or deformity.  Lymphadenopathy:    She has no cervical adenopathy.  Neurological: She is alert and oriented to person, place, and time. Coordination normal.  Skin: Skin is warm and dry. Capillary refill takes less than 2 seconds. No rash noted.  Psychiatric: She has a normal mood and affect. Her behavior is normal.  Nursing note and vitals reviewed.    Musculoskeletal Exam:  Full range of motion of all joints except some problem with flexion of the left knee more so than the right knee Grip strength is equal and strong  bilaterally Fibromyalgia tender points are all absent  CDAI Exam: CDAI Homunculus Exam:   Joint Counts:  CDAI Tender Joint count: 0 CDAI Swollen Joint count: 0  No synovitis on examination except patient does have some pain to the left first MCP joint consistent with osteoarthritis at the cyst there is no synovitis there).   Investigation: No additional findings. Office Visit on 02/03/2017  Component Date Value Ref Range Status  . Hemoglobin, fingerstick 02/03/2017 13.0  12.0 - 15.0 g/dL Final  . Cholesterol 02/03/2017 223* <200 mg/dL Final  . Triglycerides 02/03/2017 67  <150 mg/dL Final  . HDL 02/03/2017 62  >50 mg/dL Final  . Total CHOL/HDL Ratio 02/03/2017 3.6  <5.0 Ratio Final  . VLDL 02/03/2017 13  <30 mg/dL Final  . LDL Cholesterol 02/03/2017 148* <100 mg/dL Final  . Vit D, 25-Hydroxy 02/03/2017 64  30 - 100 ng/mL Final   Comment: Vitamin D Status           25-OH Vitamin D        Deficiency                <20 ng/mL        Insufficiency         20 - 29 ng/mL        Optimal             > or = 30 ng/mL   For 25-OH Vitamin D testing on patients on D2-supplementation and patients for whom quantitation of D2 and D3 fractions is required, the QuestAssureD 25-OH VIT D, (D2,D3), LC/MS/MS is recommended: order code [id number] (patients > 2 yrs).   . WBC 02/03/2017 4.0  3.8 - 10.8 K/uL Final  . RBC 02/03/2017 4.43  3.80 - 5.10 MIL/uL Final  . Hemoglobin 02/03/2017 13.2  11.7 - 15.5 g/dL Final  . HCT 02/03/2017 39.9  35.0 - 45.0 % Final  . MCV 02/03/2017 90.1  80.0 - 100.0 fL Final  . MCH 02/03/2017 29.8  27.0 - 33.0 pg Final  . MCHC 02/03/2017 33.1  32.0 - 36.0 g/dL Final  . RDW 02/03/2017 14.9  11.0 - 15.0 % Final  . Platelets 02/03/2017 217  140 - 400 K/uL Final  . MPV 02/03/2017 10.0  7.5 - 12.5 fL Final  . Sodium 02/03/2017 141  135 - 146 mmol/L Final  . Potassium 02/03/2017 4.4  3.5 - 5.3 mmol/L Final  . Chloride 02/03/2017 106  98 - 110 mmol/L Final  . CO2 02/03/2017 25   20 - 31 mmol/L Final  . Glucose, Bld 02/03/2017 64* 65 - 99 mg/dL Final  . BUN 02/03/2017 12  7 - 25 mg/dL Final  . Creat 02/03/2017 0.79  0.50 - 1.05 mg/dL Final   Comment:   For patients > or = 53 years of age: The upper reference limit for Creatinine is approximately 13% higher for people identified as African-American.     . Total Bilirubin 02/03/2017 0.7  0.2 - 1.2 mg/dL Final  . Alkaline Phosphatase 02/03/2017 62  33 - 130 U/L Final  . AST 02/03/2017 17  10 - 35 U/L Final  . ALT 02/03/2017 14  6 - 29 U/L Final  . Total Protein 02/03/2017 6.5  6.1 - 8.1 g/dL Final  . Albumin 02/03/2017 4.0  3.6 - 5.1 g/dL Final  . Calcium 02/03/2017 9.4  8.6 - 10.4 mg/dL Final  . TSH 02/03/2017 0.89  mIU/L Final   Comment:   Reference Range   > or = 20 Years  0.40-4.50   Pregnancy Range First trimester  0.26-2.66 Second trimester 0.55-2.73 Third trimester  0.43-2.91     Orders Only on 11/12/2016  Component Date Value Ref Range Status  . WBC 11/12/2016 6.1  3.8 - 10.8 K/uL Final  . RBC 11/12/2016 4.46  3.80 - 5.10 MIL/uL Final  . Hemoglobin 11/12/2016 13.2  11.7 - 15.5 g/dL Final  . HCT 11/12/2016 39.5  35.0 - 45.0 % Final  . MCV 11/12/2016 88.6  80.0 - 100.0 fL Final  . MCH 11/12/2016 29.6  27.0 - 33.0 pg Final  . MCHC 11/12/2016 33.4  32.0 - 36.0 g/dL Final  . RDW 11/12/2016 14.6  11.0 - 15.0 % Final  . Platelets 11/12/2016 228  140 - 400 K/uL Final  . MPV 11/12/2016 10.3  7.5 - 12.5 fL Final  . Neutro Abs 11/12/2016 3782  1,500 - 7,800 cells/uL Final  . Lymphs Abs 11/12/2016 1647  850 - 3,900 cells/uL Final  . Monocytes Absolute 11/12/2016 427  200 - 950 cells/uL Final  . Eosinophils Absolute 11/12/2016 183  15 - 500 cells/uL Final  . Basophils Absolute 11/12/2016 61  0 - 200 cells/uL Final  . Neutrophils Relative % 11/12/2016 62  % Final  . Lymphocytes Relative 11/12/2016 27  % Final  . Monocytes Relative 11/12/2016 7  % Final  . Eosinophils Relative 11/12/2016 3  % Final  .  Basophils Relative 11/12/2016 1  % Final  . Smear Review 11/12/2016 Criteria for review not met   Final  . Sodium 11/12/2016 140  135 - 146 mmol/L Final  . Potassium 11/12/2016 4.3  3.5 - 5.3 mmol/L Final  . Chloride 11/12/2016 104  98 - 110 mmol/L Final  . CO2 11/12/2016 25  20 - 31 mmol/L Final  . Glucose, Bld 11/12/2016 91  65 - 99 mg/dL Final  . BUN 11/12/2016 14  7 - 25 mg/dL Final  . Creat 11/12/2016 0.89  0.50 - 1.05 mg/dL Final   Comment:   For patients > or = 53 years of age: The upper reference limit for Creatinine is approximately 13% higher for people identified as African-American.     Marland Kitchen  Total Bilirubin 11/12/2016 0.5  0.2 - 1.2 mg/dL Final  . Alkaline Phosphatase 11/12/2016 58  33 - 130 U/L Final  . AST 11/12/2016 21  10 - 35 U/L Final  . ALT 11/12/2016 13  6 - 29 U/L Final  . Total Protein 11/12/2016 6.9  6.1 - 8.1 g/dL Final  . Albumin 11/12/2016 4.2  3.6 - 5.1 g/dL Final  . Calcium 11/12/2016 9.8  8.6 - 10.4 mg/dL Final  . GFR, Est African American 11/12/2016 86  >=60 mL/min Final  . GFR, Est Non African American 11/12/2016 75  >=60 mL/min Final     Imaging: No results found.  Speciality Comments: No specialty comments available.    Procedures:  No procedures performed Allergies: Latex; Other; and Percocet [oxycodone-acetaminophen]   Assessment / Plan:     Visit Diagnoses: Rheumatoid arthritis involving multiple sites with positive rheumatoid factor (HCC)  High risk medication use    Plan: #1: Rheumatoid arthritis with positive rheumatoid factor. Patient is reporting that she is doing really well with the rheumatoid arthritis. No joint pain, swelling, stiffness. Patient has decreased her dose of Otrexup to 15 mg and is doing well with it. She admits that at times instead of taking it once a week she takes it once every 2 weeks and still is doing well. She does take her Plaquenil 200 mg twice a day 7 days week without any problems. Her Plaquenil eye  exam was normal and up-to-date as of December 2017 at Dr. Phoebe Perch office in [facility]. She is scheduled for repeat Plaquenil eye exam December 2018.   #2: High risk prescription. Methotrexate 67m injectable once a week Due to nausea and vomiting from odor of injectable methotrexate, we added Zofran at the last visit Plaquenil 200 mg twice a day 7 days a week; patient is 5 foot 7 inches tall.   Patient's labs are normal and up-to-date and due for labs either today or in a month. Patient states that since she is here today she would like to get it done today. I have put an order for CBC with differential and CMP with GFR to be done today.   #3: OA of bilateral knees based on x-rays 3 views done today in office.  #4: X-rays 3 views of bilateral knees done today. Patient is having bilateral knee pain. Right knee pain started about 2 weeks ago. Had swelling and was unable to bend the right knee then heard a pop and then the swelling went away and she was able to flex the knee some.  Left knee problem started about 6 weeks ago. She describes the problem as a catch in her knee. She is not able to go up and down the stairs without pain. Also she is unable to flex the knee a certain way before she gets into bed. Otherwise, during the day, she is not having any pain. Namely, she is able to walk without any difficulties with either knees.  Because her x-rays of the knees only show osteoarthritis, we will need to do further imaging studies.  I will order MRI of the left knee: Left knee pain 6 weeks (I sent a message to sharon for MRI of the left knee)  #5: Return to clinic in 5 months  #6: Patient will make an appointment with Dr. WDurward Fortesafter the MRI is done  Orders: No orders of the defined types were placed in this encounter.  No orders of the defined types were placed  in this encounter.   Face-to-face time spent with patient was 30 minutes. 50% of time was spent in  counseling and coordination of care.  Follow-Up Instructions: No Follow-up on file.   Eliezer Lofts, PA-C  Note - This record has been created using [employer].  Chart creation errors have been sought, but may not always  have been located. Such creation errors do not reflect on  the standard of medical care.

## 2017-03-31 ENCOUNTER — Encounter: Payer: Self-pay | Admitting: Rheumatology

## 2017-03-31 ENCOUNTER — Ambulatory Visit (INDEPENDENT_AMBULATORY_CARE_PROVIDER_SITE_OTHER): Payer: BLUE CROSS/BLUE SHIELD | Admitting: Rheumatology

## 2017-03-31 ENCOUNTER — Ambulatory Visit (INDEPENDENT_AMBULATORY_CARE_PROVIDER_SITE_OTHER): Payer: Self-pay

## 2017-03-31 VITALS — BP 118/64 | HR 60 | Resp 14 | Ht 67.0 in | Wt 158.0 lb

## 2017-03-31 DIAGNOSIS — Z79899 Other long term (current) drug therapy: Secondary | ICD-10-CM | POA: Diagnosis not present

## 2017-03-31 DIAGNOSIS — M0579 Rheumatoid arthritis with rheumatoid factor of multiple sites without organ or systems involvement: Secondary | ICD-10-CM | POA: Diagnosis not present

## 2017-03-31 DIAGNOSIS — M25562 Pain in left knee: Secondary | ICD-10-CM | POA: Diagnosis not present

## 2017-03-31 DIAGNOSIS — M25561 Pain in right knee: Secondary | ICD-10-CM

## 2017-03-31 DIAGNOSIS — G8929 Other chronic pain: Secondary | ICD-10-CM | POA: Diagnosis not present

## 2017-03-31 DIAGNOSIS — M419 Scoliosis, unspecified: Secondary | ICD-10-CM | POA: Diagnosis not present

## 2017-03-31 LAB — CBC WITH DIFFERENTIAL/PLATELET
BASOS ABS: 0 {cells}/uL (ref 0–200)
Basophils Relative: 0 %
EOS PCT: 5 %
Eosinophils Absolute: 240 cells/uL (ref 15–500)
HEMATOCRIT: 39.1 % (ref 35.0–45.0)
HEMOGLOBIN: 13 g/dL (ref 11.7–15.5)
LYMPHS ABS: 1776 {cells}/uL (ref 850–3900)
Lymphocytes Relative: 37 %
MCH: 30 pg (ref 27.0–33.0)
MCHC: 33.2 g/dL (ref 32.0–36.0)
MCV: 90.1 fL (ref 80.0–100.0)
MONO ABS: 384 {cells}/uL (ref 200–950)
MPV: 10.3 fL (ref 7.5–12.5)
Monocytes Relative: 8 %
NEUTROS ABS: 2400 {cells}/uL (ref 1500–7800)
NEUTROS PCT: 50 %
Platelets: 280 10*3/uL (ref 140–400)
RBC: 4.34 MIL/uL (ref 3.80–5.10)
RDW: 14.3 % (ref 11.0–15.0)
WBC: 4.8 10*3/uL (ref 3.8–10.8)

## 2017-03-31 LAB — COMPLETE METABOLIC PANEL WITH GFR
ALT: 14 U/L (ref 6–29)
AST: 17 U/L (ref 10–35)
Albumin: 4.1 g/dL (ref 3.6–5.1)
Alkaline Phosphatase: 66 U/L (ref 33–130)
BUN: 13 mg/dL (ref 7–25)
CHLORIDE: 103 mmol/L (ref 98–110)
CO2: 26 mmol/L (ref 20–31)
Calcium: 9.8 mg/dL (ref 8.6–10.4)
Creat: 0.74 mg/dL (ref 0.50–1.05)
Glucose, Bld: 85 mg/dL (ref 65–99)
Potassium: 4.6 mmol/L (ref 3.5–5.3)
SODIUM: 139 mmol/L (ref 135–146)
TOTAL PROTEIN: 6.7 g/dL (ref 6.1–8.1)
Total Bilirubin: 0.5 mg/dL (ref 0.2–1.2)

## 2017-03-31 MED ORDER — METHOTREXATE (PF) 15 MG/0.4ML ~~LOC~~ SOAJ: 15.0000 mg | SUBCUTANEOUS | 0 refills | Status: DC

## 2017-03-31 NOTE — Progress Notes (Signed)
I was asked to provide patient with a sample of Otexup 15 mg pen.  Patient has reduced her Otrexup dose to 15 mg using sample pens provided by our office.  She is taking medication once a week or once every other week.  Discussed adherence with medication.  A new prescription for Otrexup 15 mg was also sent to patient's pharmacy.  I advised her we will submit prior authorization for the new strength.  Patient was also provided with Otexup coupon card to assist with medication cost.  Medication Samples have been provided to the patient.  Drug name: Otrexup       Strength: 15 mg        Qty: 1  LOT: [id number]  Exp.Date: 05/19  Dosing instructions: Inject under the skin once a week.    The patient has been instructed regarding the correct time, dose, and frequency of taking this medication, including desired effects and most common side effects.   Elisabeth Most, Pharm.D., BCPS, CPP Clinical Pharmacist Pager: [phone] Phone: [phone] 03/31/2017 5:22 PM

## 2017-04-01 ENCOUNTER — Telehealth: Payer: Self-pay

## 2017-04-01 NOTE — Telephone Encounter (Signed)
A prior authorization for Otrexup was submitted to [facility] via cover my meds. Will update once we receive a response.  Reighlynn Swiney, [place], CPhT  10:04 AM

## 2017-04-02 NOTE — Telephone Encounter (Signed)
Received a confirmation from covermymeds stating that no prior authorization is required for otrexup 15mg /0.68ml.   Left patient a message.  Tyshana Nishida, [place], CPhT 8:42 AM

## 2017-04-07 ENCOUNTER — Telehealth: Payer: Self-pay

## 2017-04-07 NOTE — Telephone Encounter (Signed)
Patient calling to check the status of her MRI and patient stated that there is an error on the reading of her left Knee x-ray report.  Stated that it said Moderate-severe osteoarthritis of the right knee, but is should have stated the left knee. Please advise.  Thank you.  Cb# is [phone].

## 2017-04-08 NOTE — Telephone Encounter (Signed)
Dr. D patient

## 2017-04-12 ENCOUNTER — Telehealth: Payer: Self-pay | Admitting: Rheumatology

## 2017-04-12 ENCOUNTER — Ambulatory Visit: Payer: BLUE CROSS/BLUE SHIELD | Admitting: Rheumatology

## 2017-04-12 NOTE — Telephone Encounter (Signed)
Patient called about MRI for knee. Patient states she has been waiting a couple weeks for this to be scheduled. Please advise.  Cb# [phone]

## 2017-04-16 ENCOUNTER — Other Ambulatory Visit: Payer: Self-pay | Admitting: *Deleted

## 2017-04-16 DIAGNOSIS — G8929 Other chronic pain: Secondary | ICD-10-CM

## 2017-04-16 DIAGNOSIS — M25562 Pain in left knee: Principal | ICD-10-CM

## 2017-04-16 NOTE — Telephone Encounter (Signed)
I called patient, MRI left knee ordered. Order was not placed after visit. Can you please work on this one soon? Thank you so much.

## 2017-04-19 NOTE — Telephone Encounter (Signed)
Done appt scheduled and pt aware

## 2017-04-24 ENCOUNTER — Ambulatory Visit (HOSPITAL_COMMUNITY)
Admission: RE | Admit: 2017-04-24 | Discharge: 2017-04-24 | Disposition: A | Discharge status: Home / Self Care | Payer: BLUE CROSS/BLUE SHIELD | Source: Ambulatory Visit | Attending: Rheumatology | Admitting: Rheumatology

## 2017-04-24 DIAGNOSIS — G8929 Other chronic pain: Secondary | ICD-10-CM | POA: Diagnosis not present

## 2017-04-24 DIAGNOSIS — M25562 Pain in left knee: Secondary | ICD-10-CM | POA: Insufficient documentation

## 2017-04-24 DIAGNOSIS — M25462 Effusion, left knee: Secondary | ICD-10-CM | POA: Insufficient documentation

## 2017-04-25 NOTE — Progress Notes (Signed)
MRI shows chondromalacia patella. Otherwise unremarkable. Advise Voltaren gel and PT.

## 2017-04-27 ENCOUNTER — Telehealth: Payer: Self-pay | Admitting: *Deleted

## 2017-04-27 ENCOUNTER — Telehealth: Payer: Self-pay | Admitting: Rheumatology

## 2017-04-27 DIAGNOSIS — M2242 Chondromalacia patellae, left knee: Secondary | ICD-10-CM

## 2017-04-27 MED ORDER — DICLOFENAC SODIUM 1 % TD GEL: TRANSDERMAL | 1 refills | Status: DC

## 2017-04-27 NOTE — Telephone Encounter (Signed)
-----   Message from Bo Merino, MD sent at 04/25/2017  7:52 PM EDT ----- MRI shows chondromalacia patella. Otherwise unremarkable. Advise Voltaren gel and PT.

## 2017-04-27 NOTE — Telephone Encounter (Signed)
Patient returning your call.

## 2017-04-27 NOTE — Telephone Encounter (Signed)
Patient advised of lab results and referral placed for PT and prescription sent to pharmacy for Voltaren Gel.

## 2017-04-28 ENCOUNTER — Telehealth: Payer: Self-pay

## 2017-04-28 NOTE — Telephone Encounter (Signed)
A prior authorization for Voltaren Gel has been submitted to patient's insurance via cover my meds. Will update once we receive a response.   Rebecca Fleming, [place], CPhT 8:38 AM

## 2017-04-29 NOTE — Telephone Encounter (Signed)
Received a fax from [facility] regarding a prior authorization DENIAL for Voltaren Gel because it's only approved to treat osteoarthritis of the elbow,wrist, hand, knee ankle and foot.   Called patient to update her. Gave her information about the GoodRx coupon. Patient voiced understanding and denied any questions at this time.  Reference number:[id number] Phone number: [phone]  Will send document to scan center.  Jadesola Poynter, [place], CPhT  1:15 PM

## 2017-05-04 ENCOUNTER — Telehealth: Payer: Self-pay | Admitting: Physical Therapy

## 2017-05-04 NOTE — Telephone Encounter (Signed)
04/29/17 & 05/04/17 left message to call PT eval, no return call

## 2017-06-20 ENCOUNTER — Other Ambulatory Visit: Payer: Self-pay | Admitting: Rheumatology

## 2017-06-21 NOTE — Telephone Encounter (Signed)
Last Visit: 03/31/17 Next Visit due December 2018. Message sent to the front to schedule patient.  Labs: 03/31/17 WNL  PLQ Eye Exam: 08/2016 WNL  Okay to refill per Dr. Estanislado Pandy

## 2017-06-28 ENCOUNTER — Telehealth: Payer: Self-pay | Admitting: Rheumatology

## 2017-06-28 NOTE — Telephone Encounter (Signed)
-----   Message from Carole Binning, LPN sent at 75/12/3604  9:50 AM EDT ----- Regarding: Please schedule patient for follow up visit.  Please schedule patient for follow up visit. Patient due December 2018. Thanks!

## 2017-06-28 NOTE — Telephone Encounter (Signed)
Left message on machine for patient to call back to schedule follow up appointment.

## 2017-07-08 ENCOUNTER — Telehealth: Payer: Self-pay | Admitting: Obstetrics and Gynecology

## 2017-07-09 ENCOUNTER — Encounter: Payer: Self-pay | Admitting: Certified Nurse Midwife

## 2017-07-09 ENCOUNTER — Ambulatory Visit (INDEPENDENT_AMBULATORY_CARE_PROVIDER_SITE_OTHER): Payer: BLUE CROSS/BLUE SHIELD | Admitting: Certified Nurse Midwife

## 2017-07-09 VITALS — BP 84/60 | HR 68 | Temp 98.9°F | Resp 16 | Ht 66.0 in | Wt 162.0 lb

## 2017-07-09 DIAGNOSIS — N390 Urinary tract infection, site not specified: Secondary | ICD-10-CM | POA: Diagnosis not present

## 2017-07-09 LAB — POCT URINALYSIS DIPSTICK
Bilirubin, UA: NEGATIVE
Glucose, UA: NEGATIVE
KETONES UA: NEGATIVE
Nitrite, UA: NEGATIVE
PROTEIN UA: NEGATIVE
RBC UA: NEGATIVE
Urobilinogen, UA: NEGATIVE E.U./dL — AB
pH, UA: 5 (ref 5.0–8.0)

## 2017-07-09 MED ORDER — SULFAMETHOXAZOLE-TRIMETHOPRIM 800-160 MG PO TABS: 1.0000 | ORAL_TABLET | Freq: Two times a day (BID) | ORAL | 0 refills | Status: DC

## 2017-07-09 NOTE — Patient Instructions (Signed)

## 2017-07-09 NOTE — Progress Notes (Signed)
51 y.o. Married Caucasian female G2P2 here with complaint of UTI, with onset  on 6 days ago. She had been out of the country and also at the beach in the water prior to onset of symptoms.. . Patient complaining of urinary frequency/urgency/ and pain with urination. Patient denies fever, chills, nausea. Some lower  back pain, but also had been on a long flight and riding in car.. No new personal products. Patient feels not related to sexual activity. Denies any vaginal symptoms.    Menopausal no vaginal dryness. Patient may be consuming adequate water intake. Also drinks soda and coffee frequently.  ROS Pertinent to above  O: Healthy female WDWN Affect: Normal, orientation x 3 Skin : warm and dry CVAT: slight left only  Abdomen: positive for suprapubic tenderness  Pelvic exam: External genital area: normal, no lesions Bladder,Urethra tender, Urethral meatus: tender, red Vagina: normal vaginal discharge, normal appearance Cervix/uterus/adnexa surgically absent No pain or fullness or masses noted in adnexal area  A: UTI Normal pelvic exam poct urine-wbc tr RA not on Methotrexate at present   P: Reviewed findings of UTI and need for treatment. Rx: Bactrim DS see order with instructions GYI:[id number] culture Reviewed warning signs and symptoms of UTI and need to advise if occurring. Encouraged to limit soda, tea, and coffee and be sure to increase water intake. Do not use Methotrexate with Bactrim DS due to interaction. Patient voiced understanding.    RV prn

## 2017-07-10 LAB — URINE CULTURE

## 2017-07-12 NOTE — Telephone Encounter (Signed)
No message needed. Disregard.

## 2017-09-01 ENCOUNTER — Encounter: Payer: Self-pay | Admitting: Rheumatology

## 2017-09-01 ENCOUNTER — Ambulatory Visit: Payer: BLUE CROSS/BLUE SHIELD | Admitting: Rheumatology

## 2017-09-01 VITALS — BP 107/62 | HR 58 | Resp 16 | Ht 67.0 in | Wt 163.0 lb

## 2017-09-01 DIAGNOSIS — M25462 Effusion, left knee: Secondary | ICD-10-CM | POA: Diagnosis not present

## 2017-09-01 DIAGNOSIS — M170 Bilateral primary osteoarthritis of knee: Secondary | ICD-10-CM | POA: Diagnosis not present

## 2017-09-01 DIAGNOSIS — Z853 Personal history of malignant neoplasm of breast: Secondary | ICD-10-CM

## 2017-09-01 DIAGNOSIS — Z79899 Other long term (current) drug therapy: Secondary | ICD-10-CM | POA: Diagnosis not present

## 2017-09-01 DIAGNOSIS — M0579 Rheumatoid arthritis with rheumatoid factor of multiple sites without organ or systems involvement: Secondary | ICD-10-CM

## 2017-09-01 DIAGNOSIS — M19041 Primary osteoarthritis, right hand: Secondary | ICD-10-CM

## 2017-09-01 DIAGNOSIS — M19042 Primary osteoarthritis, left hand: Secondary | ICD-10-CM

## 2017-09-01 DIAGNOSIS — M419 Scoliosis, unspecified: Secondary | ICD-10-CM | POA: Diagnosis not present

## 2017-09-01 MED ORDER — TRIAMCINOLONE ACETONIDE 40 MG/ML IJ SUSP
60.0000 mg | INTRAMUSCULAR | Status: AC | PRN
  Administered 2017-09-01: 60 mg via INTRA_ARTICULAR

## 2017-09-01 MED ORDER — LIDOCAINE HCL 1 % IJ SOLN
3.0000 mL | INTRAMUSCULAR | Status: AC | PRN
  Administered 2017-09-01: 3 mL

## 2017-09-01 NOTE — Progress Notes (Signed)
Office Visit Note  Patient: Rebecca Fleming             Date of Birth: Jan 25, 1966           MRN: [id number]             PCP: Patient, No Pcp Per Referring: No ref. provider found Visit Date: 09/01/2017 Occupation: @GUAROCC @    Subjective:  Other (left knee pain/stiffness )   History of Present Illness: Rebecca Fleming is a 51 y.o. female with history of sero positive rheumatoid arthritis. She states that she's decided to come off methotrexate in May 2018 as her symptoms are very well controlled. She continues to take Plaquenil. She states over time her symptoms are getting worse. She has increased pain and discomfort in her left knee joint which is been swollen lately. She's been also having some discomfort in her hands especially her right wrist joint.  Activities of Daily Living:  Patient reports morning stiffness for 5-10 minutes.   Patient Reports nocturnal pain.  Difficulty dressing/grooming: Denies Difficulty climbing stairs: Reports Difficulty getting out of chair: Reports Difficulty using hands for taps, buttons, cutlery, and/or writing: Denies   Review of Systems  Constitutional: Positive for fatigue. Negative for night sweats, weight gain, weight loss and weakness.  HENT: Negative for mouth sores, trouble swallowing, trouble swallowing, mouth dryness and nose dryness.   Eyes: Negative for pain, redness, visual disturbance and dryness.  Respiratory: Negative for cough, shortness of breath and difficulty breathing.   Cardiovascular: Negative for chest pain, palpitations, hypertension, irregular heartbeat and swelling in legs/feet.  Gastrointestinal: Negative for blood in stool, constipation and diarrhea.  Endocrine: Negative for increased urination.  Genitourinary: Negative for vaginal dryness.  Musculoskeletal: Positive for arthralgias, joint pain, joint swelling and morning stiffness. Negative for myalgias, muscle weakness, muscle tenderness and myalgias.  Skin: Negative  for color change, rash, hair loss, skin tightness, ulcers and sensitivity to sunlight.  Allergic/Immunologic: Negative for susceptible to infections.  Neurological: Negative for dizziness, memory loss and night sweats.  Hematological: Negative for swollen glands.  Psychiatric/Behavioral: Negative for depressed mood and sleep disturbance. The patient is not nervous/anxious.     PMFS History:  Patient Active Problem List   Diagnosis Date Noted  . Primary osteoarthritis of both knees 09/01/2017  . Scoliosis 09/01/2017  . High risk medication use 11/11/2016  . Primary osteoarthritis of both hands 11/11/2016  . Lateral epicondylitis, right elbow 11/11/2016  . Myopia with astigmatism and presbyopia, bilateral 09/09/2016  . 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 Status post laparoscopic assisted vaginal hysterectomy (LAVH) 01/15/2015  . Breast cancer, left breast ([place]) 11/07/2014  . Postmenopausal bleeding 07/13/2013  . Rheumatoid arthritis ([place]) 01/11/2012  . Psoriasis 01/11/2012    Past Medical History:  Diagnosis Date  . Abnormal uterine bleeding   . Anemia   . Arthritis   . Asthma    exercised induced asthma - rarely uses inhaler  . Blood transfusion without reported diagnosis 1976   1976  following surgery at [facility]   . Cancer ([place]) 08/2008   left breast--has bilateral mastectomy  . Complication of anesthesia    pt states" gets shakes" with general  . Elevated LDL cholesterol level   . Environmental and seasonal allergies   . Fibroid   . Headache    sleeps , no meds  . Heart murmur    never has caused any problems  . History of breast cancer   . RA (  rheumatoid arthritis) ([place])   . Scoliosis    tx with robaxin prn - rarely uses  . SVD (spontaneous vaginal delivery) 1996   x 1    Family History  Problem Relation Age of Onset  . Breast cancer Mother   . Diabetes Mother   . Hypertension Mother   . Stroke Mother   . Heart failure Father   .  Hypertension Father    Past Surgical History:  Procedure Laterality Date  . ABDOMINAL HYSTERECTOMY    . APPENDECTOMY    . BREAST LUMPECTOMY  05/17/2008   x 2 re excise for margin - Dr Margot Chimes, bilateral mastectomy 08/2008  . BREAST LUMPECTOMY W/ NEEDLE LOCALIZATION  04/23/2008   w SLN Dr Margot Chimes  . CESAREAN SECTION  01/01/2004   Dr Quincy Simmonds  . ENDOMETRIAL BIOPSY  06/2013, 2015   x 2  . EYE SURGERY  2003   bilateral lasik  . HYSTEROSCOPY W/D&C  07/21/2011   Procedure: DILATATION AND CURETTAGE (D&C) /HYSTEROSCOPY;  Surgeon: Arloa Koh;  Location: [facility];  Service: Gynecology;  Laterality: N/A;  with Ultrasound Guidance  . INSERTION OF TISSUE EXPANDER AFTER MASTECTOMY  09/11/2008   Dr Harlow Mares  . KNEE ARTHROSCOPY W/ MENISCAL REPAIR Right 2010   --Dr. Telford Nab  . LAPAROSCOPIC ASSISTED VAGINAL HYSTERECTOMY N/A 01/15/2015   Procedure: LAPAROSCOPIC ASSISTED VAGINAL HYSTERECTOMY;  Surgeon: Nunzio Cobbs, MD;  Location: [facility];  Service: Gynecology;  Laterality: N/A;  . left foot surgery  2004  . left rotator cuff repair  01/2006  . MASTECTOMY  09/11/2008   bilateral - Dr Margot Chimes  . PORT-A-CATH REMOVAL  09/09/2009   Dr Margot Chimes  . PORTACATH PLACEMENT  05/17/2008   Dr Margot Chimes  . REMOVAL OF TISSUE EXPANDER AND PLACEMENT OF IMPLANT  12/2008  . SALPINGOOPHORECTOMY Bilateral 01/15/2015   Procedure: BILATERAL SALPINGO OOPHORECTOMY;  Surgeon: Nunzio Cobbs, MD;  Location: [facility];  Service: Gynecology;  Laterality: Bilateral;  . UMBILICAL HERNIA REPAIR  09/09/2009   Dr Margot Chimes  . WISDOM TOOTH EXTRACTION     Social History   Social History Narrative  . Not on file     Objective: Vital Signs: BP 107/62 (BP Location: Right Arm, Patient Position: Sitting, Cuff Size: Normal)   Pulse (!) 58   Resp 16   Ht 5\' 7"  (1.702 m)   Wt 163 lb (73.9 kg)   LMP 11/12/2014   BMI 25.53 kg/m    Physical Exam  Constitutional: She is oriented to person, place, and time. She appears well-developed  and well-nourished.  HENT:  Head: Normocephalic and atraumatic.  Eyes: Conjunctivae and EOM are normal.  Neck: Normal range of motion.  Cardiovascular: Normal rate, regular rhythm, normal heart sounds and intact distal pulses.  Pulmonary/Chest: Effort normal and breath sounds normal.  Abdominal: Soft. Bowel sounds are normal.  Lymphadenopathy:    She has no cervical adenopathy.  Neurological: She is alert and oriented to person, place, and time.  Skin: Skin is warm and dry. Capillary refill takes less than 2 seconds.  Psychiatric: She has a normal mood and affect. Her behavior is normal.  Nursing note and vitals reviewed.    Musculoskeletal Exam: C-spine good range of motion. Shoulder joints, elbow joints, wrist joints are good range of motion. She is some tenderness over right CMC joint. No synovitis was noted. No MCP PIP/DIP swelling or synovitis was noted. Left knee joint warmth swelling and effusion was noted. There was some warmth in  the right knee joint.  CDAI Exam: CDAI Homunculus Exam:   Tenderness:  LLE: tibiofemoral  Swelling:  LLE: tibiofemoral  Joint Counts:  CDAI Tender Joint count: 1 CDAI Swollen Joint count: 1  Global Assessments:  Patient Global Assessment: 7 Provider Global Assessment: 7  CDAI Calculated Score: 16    Investigation: No additional findings.  CBC Latest Ref Rng & Units 03/31/2017 02/03/2017 02/03/2017  WBC 3.8 - 10.8 K/uL 4.8 4.0 -  Hemoglobin 11.7 - 15.5 g/dL 13.0 13.2 13.0  Hematocrit 35.0 - 45.0 % 39.1 39.9 -  Platelets 140 - 400 K/uL 280 217 -   CMP Latest Ref Rng & Units 03/31/2017 02/03/2017 11/12/2016  Glucose 65 - 99 mg/dL 85 64(L) 91  BUN 7 - 25 mg/dL 13 12 14   Creatinine 0.50 - 1.05 mg/dL 0.74 0.79 0.89  Sodium 135 - 146 mmol/L 139 141 140  Potassium 3.5 - 5.3 mmol/L 4.6 4.4 4.3  Chloride 98 - 110 mmol/L 103 106 104  CO2 20 - 31 mmol/L 26 25 25   Calcium 8.6 - 10.4 mg/dL 9.8 9.4 9.8  Total Protein 6.1 - 8.1 g/dL 6.7 6.5 6.9    Total Bilirubin 0.2 - 1.2 mg/dL 0.5 0.7 0.5  Alkaline Phos 33 - 130 U/L 66 62 58  AST 10 - 35 U/L 17 17 21   ALT 6 - 29 U/L 14 14 13     Imaging: No results found.  Speciality Comments: No specialty comments available.    Procedures:  Large Joint Inj on 09/01/2017 4:16 PM Indications: pain Details: 27 G 1.5 in needle, medial approach  Arthrogram: No  Medications: 3 mL lidocaine 1 %; 60 mg triamcinolone acetonide 40 MG/ML Aspirate: 28 mL Outcome: tolerated well, no immediate complications Procedure, treatment alternatives, risks and benefits explained, specific risks discussed. Consent was given by the patient. Immediately prior to procedure a time out was called to verify the correct patient, procedure, equipment, support staff and site/side marked as required. Patient was prepped and draped in the usual sterile fashion.     Allergies: Latex; Other; and Percocet [oxycodone-acetaminophen]   Assessment / Plan:     Visit Diagnoses: Rheumatoid arthritis involving multiple sites with positive rheumatoid factor (HCC) - +RF, +CCP. She is having a flare of rheumatoid arthritis off methotrexate. She complains of increased pain and discomfort in multiple joints including her bilateral knee joints. We had detailed discussion regarding rheumatoid arthritis and aggressive nature of her arthritis. She's taking methotrexate in the past without any side effects. We discussed resuming methotrexate at 20 mg subcutaneous every week. She will get labs in 1 month and then every 3 months to monitor for drug toxicity.  High risk medication use - PLQ 200mg  po bid, MTX 20mg  sq q week off since May 2820, folic acid eye exam 81/3887  Effusion of left knee: After informed consent was obtained left knee joint was prepped and 28 ML of clear synovial fluid was aspirated. Which was sent for cell count crystals and culture. The procedures described above.  Primary osteoarthritis of both hands: The discomfort in  her hands is mostly in the [facility] joint due to underlying osteoarthritis.  Primary osteoarthritis of both knees  Scoliosis, unspecified scoliosis type, unspecified spinal region  History of breast cancer    Orders: Orders Placed This Encounter  Procedures  . Large Joint Inj  . CBC with Differential/Platelet  . COMPLETE METABOLIC PANEL WITH GFR  . Synovial cell count + diff, w/ crystals   No orders of  the defined types were placed in this encounter.   Face-to-face time spent with patient was 30 minutes. Greater than 50% of time was spent in counseling and coordination of care.  Follow-Up Instructions: Return in about 4 months (around 12/31/2017) for Rheumatoid arthritis.   Bo Merino, MD  Note - This record has been created using Editor, commissioning.  Chart creation errors have been sought, but may not always  have been located. Such creation errors do not reflect on  the standard of medical care.

## 2017-09-01 NOTE — Patient Instructions (Signed)
Standing Labs We placed an order today for your standing lab work.    Please come back and get your standing labs in 1 month and then every 3 months to monitor for drug toxicity  We have open lab Monday through Friday from 8:30-11:30 AM and 1:30-4 PM at the office of Dr. Bo Merino.   The office is located at [address], [place], [place], [place] [address] No appointment is necessary.   Labs are drawn by [facility].  You may receive a bill from [facility] for your lab work. If you have any questions regarding directions or hours of operation,  please call [phone].

## 2017-09-02 LAB — SYNOVIAL CELL COUNT + DIFF, W/ CRYSTALS
BASOPHILS, %: 0 %
EOSINOPHILS-SYNOVIAL: 0 % (ref 0–2)
LYMPHOCYTES-SYNOVIAL FLD: 31 % (ref 0–74)
MONOCYTE/MACROPHAGE: 36 % (ref 0–69)
Neutrophil, Synovial: 21 % (ref 0–24)
SYNOVIOCYTES, %: 12 % (ref 0–15)
WBC, Synovial: 465 cells/uL — ABNORMAL HIGH (ref <150)

## 2017-09-07 ENCOUNTER — Encounter: Payer: Self-pay | Admitting: Rheumatology

## 2017-11-02 ENCOUNTER — Other Ambulatory Visit: Payer: Self-pay | Admitting: Rheumatology

## 2017-11-02 NOTE — Telephone Encounter (Signed)
Last Visit: 09/01/17 Next Visit: 01/14/18  Okay to refill per Dr. Estanislado Pandy

## 2017-12-10 ENCOUNTER — Encounter: Payer: Self-pay | Admitting: Gastroenterology

## 2017-12-24 ENCOUNTER — Other Ambulatory Visit: Payer: Self-pay | Admitting: Rheumatology

## 2017-12-24 NOTE — Telephone Encounter (Signed)
Last visit: 09/01/2017 Next visit: 01/11/2018 Labs: 03/31/2017 WNL  Eye exam: 08/2017 per patient   Advised patient she is due for labs and she will come in next week to have those drawn. Per patient, eye exam was in 08/2017. She will contact eye doctor and have a copy sent over.   Okay to refill 30 day supply, per Dr. Estanislado Pandy.

## 2017-12-28 NOTE — Progress Notes (Signed)
Office Visit Note  Patient: Rebecca Fleming             Date of Birth: [birth year]           MRN: [id number]             PCP: Patient, No Pcp Per Referring: No ref. provider found Visit Date: 01/11/2018 Occupation: @GUAROCC @    Subjective:  Medication monitoring    History of Present Illness: Rebecca Fleming is a 52 y.o. female with history of seropositive rheumatoid arthritis and osteoarthritis.  Patient states that she continues to take Plaquenil 200 mg twice daily.  She states that she had a back and eye exam performed on September 16, 2017.  She states that she has been off of methotrexate since the beginning of January.  She states that she took a dose of methotrexate on January 1 in the next day she had a severe headache and has not resumed methotrexate.  She denies any joint pain or joint swelling at this time.  She states that previously her left knee had a right effusion which was aspirated at her last visit.  She states she has not had any swelling in her left knee since.  She states she has minimal joint stiffness first thing in the morning.  She states that she does have back discomfort due to history of scoliosis.  She states that she has developed a ganglion cyst on her right wrist.  She states it is been very painful especially if she is applying pressure to that area.     Activities of Daily Living:  Patient reports morning stiffness for 5-10 minutes.   Patient Denies nocturnal pain.  Difficulty dressing/grooming: Denies Difficulty climbing stairs: Denies Difficulty getting out of chair: Denies Difficulty using hands for taps, buttons, cutlery, and/or writing: Denies   Review of Systems  Constitutional: Negative for fatigue.  HENT: Negative for mouth sores, trouble swallowing, trouble swallowing, mouth dryness and nose dryness.   Eyes: Negative for pain, visual disturbance and dryness.  Respiratory: Negative for cough, hemoptysis, shortness of breath and difficulty  breathing.   Cardiovascular: Negative for chest pain, palpitations, hypertension and swelling in legs/feet.  Gastrointestinal: Negative for blood in stool, constipation and diarrhea.  Endocrine: Negative for increased urination.  Genitourinary: Negative for painful urination.  Musculoskeletal: Positive for morning stiffness. Negative for arthralgias, joint pain, joint swelling, myalgias, muscle weakness, muscle tenderness and myalgias.  Skin: Positive for nodules/bumps (Ganglion cyst right wrist). Negative for color change, pallor, rash, hair loss, skin tightness, ulcers and sensitivity to sunlight.  Allergic/Immunologic: Negative for susceptible to infections.  Neurological: Negative for dizziness, numbness, headaches and weakness.  Hematological: Negative for swollen glands.  Psychiatric/Behavioral: Negative for depressed mood and sleep disturbance. The patient is not nervous/anxious.     PMFS History:  Patient Active Problem List   Diagnosis Date Noted  . Primary osteoarthritis of both knees 09/01/2017  . Scoliosis 09/01/2017  . High risk medication use 11/11/2016  . Primary osteoarthritis of both hands 11/11/2016  . Lateral epicondylitis, right elbow 11/11/2016  . Myopia with astigmatism and presbyopia, bilateral 09/09/2016  . 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 Status post laparoscopic assisted vaginal hysterectomy (LAVH) 01/15/2015  . Breast cancer, left breast ([place]) 11/07/2014  . Postmenopausal bleeding 07/13/2013  . Rheumatoid arthritis ([place]) 01/11/2012  . Psoriasis 01/11/2012    Past Medical History:  Diagnosis Date  . Abnormal uterine bleeding   . Anemia   .  Arthritis   . Asthma    exercised induced asthma - rarely uses inhaler  . Blood transfusion without reported diagnosis 1976   1976  following surgery at [facility]   . Cancer ([place]) 08/2008   left breast--has bilateral mastectomy  . Complication of anesthesia    pt states" gets shakes" with  general  . Elevated LDL cholesterol level   . Environmental and seasonal allergies   . Fibroid   . Headache    sleeps , no meds  . Heart murmur    never has caused any problems  . History of breast cancer   . RA (rheumatoid arthritis) ([place])   . Scoliosis    tx with robaxin prn - rarely uses  . SVD (spontaneous vaginal delivery) 1996   x 1    Family History  Problem Relation Age of Onset  . Breast cancer Mother   . Diabetes Mother   . Hypertension Mother   . Stroke Mother   . Heart failure Father   . Hypertension Father    Past Surgical History:  Procedure Laterality Date  . ABDOMINAL HYSTERECTOMY    . APPENDECTOMY    . BREAST LUMPECTOMY  05/17/2008   x 2 re excise for margin - Dr Margot Chimes, bilateral mastectomy 08/2008  . BREAST LUMPECTOMY W/ NEEDLE LOCALIZATION  04/23/2008   w SLN Dr Margot Chimes  . CESAREAN SECTION  01/01/2004   Dr Quincy Simmonds  . ENDOMETRIAL BIOPSY  06/2013, 2015   x 2  . EYE SURGERY  2003   bilateral lasik  . HYSTEROSCOPY W/D&C  07/21/2011   Procedure: DILATATION AND CURETTAGE (D&C) /HYSTEROSCOPY;  Surgeon: Arloa Koh;  Location: [facility];  Service: Gynecology;  Laterality: N/A;  with Ultrasound Guidance  . INSERTION OF TISSUE EXPANDER AFTER MASTECTOMY  09/11/2008   Dr Harlow Mares  . KNEE ARTHROSCOPY W/ MENISCAL REPAIR Right 2010   --Dr. Telford Nab  . LAPAROSCOPIC ASSISTED VAGINAL HYSTERECTOMY N/A 01/15/2015   Procedure: LAPAROSCOPIC ASSISTED VAGINAL HYSTERECTOMY;  Surgeon: Nunzio Cobbs, MD;  Location: [facility];  Service: Gynecology;  Laterality: N/A;  . left foot surgery  2004  . left rotator cuff repair  01/2006  . MASTECTOMY  09/11/2008   bilateral - Dr Margot Chimes  . PORT-A-CATH REMOVAL  09/09/2009   Dr Margot Chimes  . PORTACATH PLACEMENT  05/17/2008   Dr Margot Chimes  . REMOVAL OF TISSUE EXPANDER AND PLACEMENT OF IMPLANT  12/2008  . SALPINGOOPHORECTOMY Bilateral 01/15/2015   Procedure: BILATERAL SALPINGO OOPHORECTOMY;  Surgeon: Nunzio Cobbs, MD;  Location: [facility];  Service: Gynecology;  Laterality: Bilateral;  . UMBILICAL HERNIA REPAIR  09/09/2009   Dr Margot Chimes  . WISDOM TOOTH EXTRACTION     Social History   Social History Narrative  . Not on file     Objective: Vital Signs: BP 111/73 (BP Location: Right Arm, Patient Position: Sitting, Cuff Size: Normal)   Pulse (!) 57   Resp 12   Ht 5\' 7"  (1.702 m)   Wt 158 lb 8 oz (71.9 kg)   LMP 11/12/2014   BMI 24.82 kg/m    Physical Exam  Constitutional: She is oriented to person, place, and time. She appears well-developed and well-nourished.  HENT:  Head: Normocephalic and atraumatic.  No parotid swelling. No oral or nasal ulcerations.   Eyes: Conjunctivae and EOM are normal.  Neck: Normal range of motion.  Cardiovascular: Normal rate, regular rhythm, normal heart sounds and intact distal pulses.  Pulmonary/Chest: Effort normal and breath  sounds normal.  Abdominal: Soft. Bowel sounds are normal.  Lymphadenopathy:    She has no cervical adenopathy.  Neurological: She is alert and oriented to person, place, and time.  Skin: Skin is warm and dry. Capillary refill takes less than 2 seconds.  Psychiatric: She has a normal mood and affect. Her behavior is normal.  Nursing note and vitals reviewed.    Musculoskeletal Exam: Severe thoracic dextra scolosis. C-spine, thoracic, and lumbar spine good ROM.  No midline spinal tenderness.  No SI joint tenderness.  Shoulder joints, elbow joints, wrist joints, MCPs, PIPs, and DIPs good ROM with no synovitis.  PIP and DIP synovial thickening consistent with osteoarthritis.vGanglion cyst present on volar aspect of right wrist.  Hip joints, knee joints, ankle joints, MTPs, PIPs, and DIPs good ROM with no synovitis.  No warmth or effusion of bilateral knee joints.   CDAI Exam: CDAI Homunculus Exam:   Joint Counts:  CDAI Tender Joint count: 0 CDAI Swollen Joint count: 0  Global Assessments:  Patient Global Assessment: 2 Provider Global Assessment:  2  CDAI Calculated Score: 4    Investigation: No additional findings. CBC Latest Ref Rng & Units 01/10/2018 03/31/2017 02/03/2017  WBC 3.8 - 10.8 Thousand/uL 3.5(L) 4.8 4.0  Hemoglobin 11.7 - 15.5 g/dL 11.8 13.0 13.2  Hematocrit 35.0 - 45.0 % 36.0 39.1 39.9  Platelets 140 - 400 Thousand/uL 225 280 217   CMP Latest Ref Rng & Units 01/10/2018 03/31/2017 02/03/2017  Glucose 65 - 99 mg/dL 99 85 64(L)  BUN 7 - 25 mg/dL 9 13 12   Creatinine 0.50 - 1.05 mg/dL 0.81 0.74 0.79  Sodium 135 - 146 mmol/L 142 139 141  Potassium 3.5 - 5.3 mmol/L 4.4 4.6 4.4  Chloride 98 - 110 mmol/L 107 103 106  CO2 20 - 32 mmol/L 29 26 25   Calcium 8.6 - 10.4 mg/dL 9.2 9.8 9.4  Total Protein 6.1 - 8.1 g/dL 6.2 6.7 6.5  Total Bilirubin 0.2 - 1.2 mg/dL 0.4 0.5 0.7  Alkaline Phos 33 - 130 U/L - 66 62  AST 10 - 35 U/L 16 17 17   ALT 6 - 29 U/L 12 14 14     Imaging: No results found.  Speciality Comments: No specialty comments available.    Procedures:  No procedures performed Allergies: Latex; Other; and Percocet [oxycodone-acetaminophen]   Assessment / Plan:     Visit Diagnoses: Rheumatoid arthritis involving multiple sites with positive rheumatoid factor (HCC) - RF+, CCP+: She has no synovitis on exam.  She has not any recent rheumatoid arthritis flares.  She continues to take Plaquenil 200 mg twice daily.  She has been off of methotrexate since January 2019.  She would not like to restart methotrexate at this time.  She was given a refill of Plaquenil 200 mg by mouth twice daily.  Her Plaquenil eye exam was performed on September 16, 2017.  She called her ophthalmologist yesterday and they are going to fax over the results.  High risk medication use - PLQ 200mg  po bid. Labs yesterday revealed mild neutropenia.  We will continue to monitor.  Primary osteoarthritis of both hands: She has PIP and DIP synovial thickening consistent with also arthritis.  Joint protection muscle strengthening were discussed.  Primary  osteoarthritis of both knees: No warmth or effusion of bilateral knees.  She has no discomfort in her knees at this time.    Effusion of left knee -She had a left knee aspiration at her last visit.  She has not  had any recurrence of an effusion.  She has no warmth or effusion on exam today.  Psoriasis  Lateral epicondylitis, right elbow: Resolved   Scoliosis, unspecified scoliosis type, unspecified spinal region: She experiences chronic discomfort.    Other medical conditions are listed as follows:   History of breast cancer  History of asthma  History of anemia  Heart murmur    Orders: No orders of the defined types were placed in this encounter.  Meds ordered this encounter  Medications  . hydroxychloroquine (PLAQUENIL) 200 MG tablet    Sig: Take 1 tablet (200 mg total) by mouth 2 (two) times daily.    Dispense:  60 tablet    Refill:  2   Follow-Up Instructions: Return in about 5 months (around 06/13/2018) for Rheumatoid arthritis, Osteoarthritis.   Ofilia Neas, PA-C   I examined and evaluated the patient with Hazel Sams PA. The plan of care was discussed as noted above.  Bo Merino, MD  Note - This record has been created using Editor, commissioning.  Chart creation errors have been sought, but may not always  have been located. Such creation errors do not reflect on  the standard of medical care.

## 2018-01-10 ENCOUNTER — Other Ambulatory Visit: Payer: Self-pay

## 2018-01-10 DIAGNOSIS — Z79899 Other long term (current) drug therapy: Secondary | ICD-10-CM

## 2018-01-10 LAB — COMPLETE METABOLIC PANEL WITH GFR
AG RATIO: 1.7 (calc) (ref 1.0–2.5)
ALBUMIN MSPROF: 3.9 g/dL (ref 3.6–5.1)
ALT: 12 U/L (ref 6–29)
AST: 16 U/L (ref 10–35)
Alkaline phosphatase (APISO): 59 U/L (ref 33–130)
BILIRUBIN TOTAL: 0.4 mg/dL (ref 0.2–1.2)
BUN: 9 mg/dL (ref 7–25)
CALCIUM: 9.2 mg/dL (ref 8.6–10.4)
CHLORIDE: 107 mmol/L (ref 98–110)
CO2: 29 mmol/L (ref 20–32)
Creat: 0.81 mg/dL (ref 0.50–1.05)
GFR, EST AFRICAN AMERICAN: 96 mL/min/{1.73_m2} (ref >=60)
GFR, EST NON AFRICAN AMERICAN: 83 mL/min/{1.73_m2} (ref >=60)
GLOBULIN: 2.3 g/dL (ref 1.9–3.7)
Glucose, Bld: 99 mg/dL (ref 65–99)
POTASSIUM: 4.4 mmol/L (ref 3.5–5.3)
Sodium: 142 mmol/L (ref 135–146)
TOTAL PROTEIN: 6.2 g/dL (ref 6.1–8.1)

## 2018-01-10 LAB — CBC WITH DIFFERENTIAL/PLATELET
BASOS ABS: 32 {cells}/uL (ref 0–200)
Basophils Relative: 0.9 %
EOS ABS: 200 {cells}/uL (ref 15–500)
EOS PCT: 5.7 %
HEMATOCRIT: 36 % (ref 35.0–45.0)
Hemoglobin: 11.8 g/dL (ref 11.7–15.5)
Lymphs Abs: 1218 cells/uL (ref 850–3900)
MCH: 28.2 pg (ref 27.0–33.0)
MCHC: 32.8 g/dL (ref 32.0–36.0)
MCV: 86.1 fL (ref 80.0–100.0)
MONOS PCT: 8 %
MPV: 11 fL (ref 7.5–12.5)
NEUTROS PCT: 50.6 %
Neutro Abs: 1771 cells/uL (ref 1500–7800)
Platelets: 225 10*3/uL (ref 140–400)
RBC: 4.18 10*6/uL (ref 3.80–5.10)
RDW: 14.4 % (ref 11.0–15.0)
Total Lymphocyte: 34.8 %
WBC mixed population: 280 cells/uL (ref 200–950)
WBC: 3.5 10*3/uL — ABNORMAL LOW (ref 3.8–10.8)

## 2018-01-11 ENCOUNTER — Ambulatory Visit: Payer: BLUE CROSS/BLUE SHIELD | Admitting: Rheumatology

## 2018-01-11 ENCOUNTER — Encounter: Payer: Self-pay | Admitting: Physician Assistant

## 2018-01-11 VITALS — BP 111/73 | HR 57 | Resp 12 | Ht 67.0 in | Wt 158.5 lb

## 2018-01-11 DIAGNOSIS — L409 Psoriasis, unspecified: Secondary | ICD-10-CM | POA: Diagnosis not present

## 2018-01-11 DIAGNOSIS — M0579 Rheumatoid arthritis with rheumatoid factor of multiple sites without organ or systems involvement: Secondary | ICD-10-CM

## 2018-01-11 DIAGNOSIS — R011 Cardiac murmur, unspecified: Secondary | ICD-10-CM | POA: Diagnosis not present

## 2018-01-11 DIAGNOSIS — Z8709 Personal history of other diseases of the respiratory system: Secondary | ICD-10-CM | POA: Diagnosis not present

## 2018-01-11 DIAGNOSIS — M19041 Primary osteoarthritis, right hand: Secondary | ICD-10-CM

## 2018-01-11 DIAGNOSIS — M25462 Effusion, left knee: Secondary | ICD-10-CM

## 2018-01-11 DIAGNOSIS — Z853 Personal history of malignant neoplasm of breast: Secondary | ICD-10-CM | POA: Diagnosis not present

## 2018-01-11 DIAGNOSIS — M7711 Lateral epicondylitis, right elbow: Secondary | ICD-10-CM

## 2018-01-11 DIAGNOSIS — M170 Bilateral primary osteoarthritis of knee: Secondary | ICD-10-CM | POA: Diagnosis not present

## 2018-01-11 DIAGNOSIS — Z79899 Other long term (current) drug therapy: Secondary | ICD-10-CM | POA: Diagnosis not present

## 2018-01-11 DIAGNOSIS — M419 Scoliosis, unspecified: Secondary | ICD-10-CM

## 2018-01-11 DIAGNOSIS — Z862 Personal history of diseases of the blood and blood-forming organs and certain disorders involving the immune mechanism: Secondary | ICD-10-CM

## 2018-01-11 DIAGNOSIS — M19042 Primary osteoarthritis, left hand: Secondary | ICD-10-CM

## 2018-01-11 MED ORDER — HYDROXYCHLOROQUINE SULFATE 200 MG PO TABS: 200.0000 mg | ORAL_TABLET | Freq: Two times a day (BID) | ORAL | 2 refills | Status: DC

## 2018-01-11 NOTE — Progress Notes (Signed)
Mild neutropenia.  We will continue to monitor.

## 2018-01-11 NOTE — Patient Instructions (Signed)
Standing Labs We placed an order today for your standing lab work.    Please come back and get your standing labs in July and every 3 months   We have open lab Monday through Friday from 8:30-11:30 AM and 1:30-4:00 PM  at the office of Dr. Shaili Deveshwar.   You may experience shorter wait times on Monday and Friday afternoons. The office is located at [address], [address], [place], [place] [address] No appointment is necessary.   Labs are drawn by [facility].  You may receive a bill from [facility] for your lab work. If you have any questions regarding directions or hours of operation,  please call [phone].    

## 2018-01-14 ENCOUNTER — Ambulatory Visit: Payer: BLUE CROSS/BLUE SHIELD | Admitting: Rheumatology

## 2018-01-14 ENCOUNTER — Ambulatory Visit: Payer: BLUE CROSS/BLUE SHIELD | Admitting: Physician Assistant

## 2018-01-25 ENCOUNTER — Encounter: Payer: Self-pay | Admitting: Gastroenterology

## 2018-01-25 ENCOUNTER — Ambulatory Visit: Payer: BLUE CROSS/BLUE SHIELD | Admitting: Gastroenterology

## 2018-01-25 VITALS — BP 102/58 | HR 64 | Ht 67.0 in | Wt 158.2 lb

## 2018-01-25 DIAGNOSIS — Z1212 Encounter for screening for malignant neoplasm of rectum: Secondary | ICD-10-CM

## 2018-01-25 DIAGNOSIS — Z1211 Encounter for screening for malignant neoplasm of colon: Secondary | ICD-10-CM

## 2018-01-25 DIAGNOSIS — R1319 Other dysphagia: Secondary | ICD-10-CM

## 2018-01-25 DIAGNOSIS — R1310 Dysphagia, unspecified: Secondary | ICD-10-CM | POA: Diagnosis not present

## 2018-01-25 NOTE — Patient Instructions (Signed)
We have scheduled your nurse visit on 03/22/18 at 1:00pm. Just bring your new insurance cards. Your procedures are scheduled for 03/30/18 1:30pm.   Normal BMI (Body Mass Index- based on height and weight) is between 19 and 25. Your BMI today is Body mass index is 24.79 kg/m. Marland Kitchen Please consider follow up  regarding your BMI with your Primary Care Provider.  Thank you for choosing me and [facility].  Pricilla Riffle. Dagoberto Ligas., MD., Marval Regal

## 2018-01-25 NOTE — Progress Notes (Signed)
History of Present Illness: This is a 52 year old female referred by Josefa Half, MD for the evaluation of dysphagia, CRC screening.  She relates several episodes of difficulty swallowing foods that have occurred randomly over the past few months One episode happened with yogurt and others have happened with solid foods.  Symptoms last for 1 or 2 minutes and are associated with chest discomfort. The symptoms resolve rapidly.  She has had extremely rare episodes of heartburn and indigestion a few times over the past 10 to 15 years.  No other gastrointestinal complaints.  She has not previously had colonoscopy. Denies weight loss, abdominal pain, constipation, diarrhea, change in stool caliber, melena, hematochezia, nausea, vomiting, chest pain.    Allergies  Allergen Reactions  . Latex Other (See Comments)    stuffiness  . Other Swelling    Patient is allergic to Yellow Dye #5  . Percocet [Oxycodone-Acetaminophen] Itching    Has had Vicodin- hydrocodone before without itching.   Outpatient Medications Prior to Visit  Medication Sig Dispense Refill  . calcium carbonate (OS-CAL) 600 MG TABS tablet Take 600 mg by mouth 2 (two) times daily with a meal.    . cetirizine (ZYRTEC) 10 MG tablet Take 10 mg by mouth daily.      . folic acid (FOLVITE) 1 MG tablet TAKE 2 TABLETS BY MOUTH EVERY DAY 180 tablet 2  . GLUCOSAMINE PO Take 2 tablets by mouth daily.     . hydroxychloroquine (PLAQUENIL) 200 MG tablet Take 1 tablet (200 mg total) by mouth 2 (two) times daily. 60 tablet 2  . ibuprofen (ADVIL) 200 MG tablet Take 200 mg by mouth as needed.    . Magnesium 500 MG TABS Take 1 tablet by mouth 2 (two) times daily.    . Manganese 50 MG TABS Take 1 tablet by mouth daily.    . mometasone (NASONEX) 50 MCG/ACT nasal spray Place 2 sprays into the nose as needed.     . Plant Sterols and Stanols (CHOLESTOFF PO) Take by mouth.    Marland Kitchen albuterol (PROVENTIL HFA;VENTOLIN HFA) 108 (90 BASE) MCG/ACT inhaler Inhale 2  puffs into the lungs every 6 (six) hours as needed. Shortness of breath     . ondansetron (ZOFRAN) 4 MG tablet Take 1 tablet (4 mg total) by mouth every 8 (eight) hours as needed for nausea or vomiting. 40 tablet 4   No facility-administered medications prior to visit.    Past Medical History:  Diagnosis Date  . Abnormal uterine bleeding   . Anemia   . Arthritis   . Asthma    exercised induced asthma - rarely uses inhaler  . Blood transfusion without reported diagnosis 1976   1976  following surgery at [facility]   . Cancer ([place]) 08/2008   left breast--has bilateral mastectomy  . Complication of anesthesia    pt states" gets shakes" with general  . Elevated LDL cholesterol level   . Environmental and seasonal allergies   . Fibroid   . Headache    sleeps , no meds  . Heart murmur    never has caused any problems  . History of breast cancer   . RA (rheumatoid arthritis) ([place])   . Scoliosis    tx with robaxin prn - rarely uses  . SVD (spontaneous vaginal delivery) 1996   x 1   Past Surgical History:  Procedure Laterality Date  . ABDOMINAL HYSTERECTOMY    . APPENDECTOMY    . BREAST LUMPECTOMY  05/17/2008   x 2 re excise for margin - Dr Margot Chimes, bilateral mastectomy 08/2008  . BREAST LUMPECTOMY W/ NEEDLE LOCALIZATION  04/23/2008   w SLN Dr Margot Chimes  . CESAREAN SECTION  01/01/2004   Dr Quincy Simmonds  . ENDOMETRIAL BIOPSY  06/2013, 2015   x 2  . EYE SURGERY  2003   bilateral lasik  . HYSTEROSCOPY W/D&C  07/21/2011   Procedure: DILATATION AND CURETTAGE (D&C) /HYSTEROSCOPY;  Surgeon: Arloa Koh;  Location: [facility];  Service: Gynecology;  Laterality: N/A;  with Ultrasound Guidance  . INSERTION OF TISSUE EXPANDER AFTER MASTECTOMY  09/11/2008   Dr Harlow Mares  . KNEE ARTHROSCOPY W/ MENISCAL REPAIR Right 2010   --Dr. Telford Nab  . LAPAROSCOPIC ASSISTED VAGINAL HYSTERECTOMY N/A 01/15/2015   Procedure: LAPAROSCOPIC ASSISTED VAGINAL HYSTERECTOMY;  Surgeon: Nunzio Cobbs, MD;  Location: [facility];   Service: Gynecology;  Laterality: N/A;  . left foot surgery  2004  . left rotator cuff repair  01/2006  . MASTECTOMY  09/11/2008   bilateral - Dr Margot Chimes  . PORT-A-CATH REMOVAL  09/09/2009   Dr Margot Chimes  . PORTACATH PLACEMENT  05/17/2008   Dr Margot Chimes  . REMOVAL OF TISSUE EXPANDER AND PLACEMENT OF IMPLANT  12/2008  . SALPINGOOPHORECTOMY Bilateral 01/15/2015   Procedure: BILATERAL SALPINGO OOPHORECTOMY;  Surgeon: Nunzio Cobbs, MD;  Location: [facility];  Service: Gynecology;  Laterality: Bilateral;  . UMBILICAL HERNIA REPAIR  09/09/2009   Dr Margot Chimes  . WISDOM TOOTH EXTRACTION     Social History   Socioeconomic History  . Marital status: Married    Spouse name: Not on file  . Number of children: 1  . Years of education: Not on file  . Highest education level: Not on file  Occupational History  . Not on file  Social Needs  . Financial resource strain: Not on file  . Food insecurity:    Worry: Not on file    Inability: Not on file  . Transportation needs:    Medical: Not on file    Non-medical: Not on file  Tobacco Use  . Smoking status: Never Smoker  . Smokeless tobacco: Never Used  Substance and Sexual Activity  . Alcohol use: Yes    Alcohol/week: 0.6 oz    Types: 1 Standard drinks or equivalent per week    Comment: occ glass of wine or beer  . Drug use: No  . Sexual activity: Yes    Partners: Male    Birth control/protection: Post-menopausal    Comment: Hyst/BSO  Lifestyle  . Physical activity:    Days per week: Not on file    Minutes per session: Not on file  . Stress: Not on file  Relationships  . Social connections:    Talks on phone: Not on file    Gets together: Not on file    Attends religious service: Not on file    Active member of club or organization: Not on file    Attends meetings of clubs or organizations: Not on file    Relationship status: Not on file  Other Topics Concern  . Not on file  Social History Narrative  . Not on file   Family  History  Problem Relation Age of Onset  . Breast cancer Mother   . Diabetes Mother   . Hypertension Mother   . Stroke Mother   . Irritable bowel syndrome Mother   . Kidney disease Mother   . Heart failure Father   .  Hypertension Father       Review of Systems: Pertinent positive and negative review of systems were noted in the above HPI section. All other review of systems were otherwise negative.    Physical Exam: General: Well developed, well nourished, no acute distress Head: Normocephalic and atraumatic Eyes:  sclerae anicteric, EOMI Ears: Normal auditory acuity Mouth: No deformity or lesions Neck: Supple, no masses or thyromegaly Lungs: Clear throughout to auscultation Heart: Regular rate and rhythm; no murmurs, rubs or bruits Abdomen: Soft, non tender and non distended. No masses, hepatosplenomegaly or hernias noted. Normal Bowel sounds Rectal: Deferred to colonoscopy Musculoskeletal: Thoracolumbar scoliosis  Skin: No lesions on visible extremities Pulses:  Normal pulses noted Extremities: No clubbing, cyanosis, edema or deformities noted Neurological: Alert oriented x 4, grossly nonfocal Cervical Nodes:  No significant cervical adenopathy Inguinal Nodes: No significant inguinal adenopathy Psychological:  Alert and cooperative. Normal mood and affect  Assessment and Recommendations:  1. CRC screening, average risk.  Schedule colonoscopy. The risks (including bleeding, perforation, infection, missed lesions, medication reactions and possible hospitalization or surgery if complications occur), benefits, and alternatives to colonoscopy with possible biopsy and possible polypectomy were discussed with the patient and they consent to proceed.   2. Dysphagia, intermittent, new onset.  Rule out esophageal stricture, motility disorder and other disorders.  Schedule EGD. The risks (including bleeding, perforation, infection, missed lesions, medication reactions and possible  hospitalization or surgery if complications occur), benefits, and alternatives to endoscopy with possible biopsy and possible dilation were discussed with the patient and they consent to proceed.    cc: Beatriz Stallion, MD

## 2018-02-07 ENCOUNTER — Encounter: Payer: Self-pay | Admitting: Obstetrics and Gynecology

## 2018-02-07 ENCOUNTER — Ambulatory Visit (INDEPENDENT_AMBULATORY_CARE_PROVIDER_SITE_OTHER): Payer: BLUE CROSS/BLUE SHIELD | Admitting: Obstetrics and Gynecology

## 2018-02-07 ENCOUNTER — Other Ambulatory Visit: Payer: Self-pay

## 2018-02-07 VITALS — BP 100/58 | HR 60 | Resp 16 | Ht 65.5 in | Wt 158.0 lb

## 2018-02-07 DIAGNOSIS — Z01419 Encounter for gynecological examination (general) (routine) without abnormal findings: Secondary | ICD-10-CM | POA: Diagnosis not present

## 2018-02-07 NOTE — Progress Notes (Signed)
52 y.o. G2P2 Married Caucasian female here for annual exam.    Would like routine blood work today.  Taking herbal supplement to lower her cholesterol.  Now off MTX.  Taking Plaquenil. Followed by Rheumatology.  Taking calcium with vit D twice daily.   Has new work.  Enjoying less stress.  Still teaching aerobics.   PCP: No PCP     Patient's last menstrual period was 11/12/2014.           Sexually active: Yes.    The current method of family planning is status post hysterectomy.    Exercising: Yes.    aerobic and strength Smoker:  no  Health Maintenance: Pap:  08-01-14 Neg:Neg HR HPV History of abnormal Pap:  no MMG:  2009 Bil.mastectomy Colonoscopy:  Never -- scheduled 03/30/18 BMD:   09/28/13  Result  Osteopenia TDaP:  2012 Gardasil:   n/a HIV: negative in the past Hep C: 01/29/16 Negative Screening Labs: PCP   reports that she has never smoked. She has never used smokeless tobacco. She reports that she drinks about 0.6 oz of alcohol per week. She reports that she does not use drugs.  Past Medical History:  Diagnosis Date  . Abnormal uterine bleeding   . Anemia   . Arthritis   . Asthma    exercised induced asthma - rarely uses inhaler  . Blood transfusion without reported diagnosis 1976   1976  [facility] surgery at [facility]   . Cancer ([place]) 08/2008   left breast--has bilateral mastectomy  . Complication of anesthesia    pt states" gets shakes" with general  . Elevated LDL cholesterol level   . Environmental and seasonal allergies   . Fibroid   . Headache    sleeps , no meds  . Heart murmur    never has caused any problems  . History of breast cancer   . RA (rheumatoid arthritis) ([place])   . Scoliosis    tx with robaxin prn - rarely uses  . SVD (spontaneous vaginal delivery) 1996   x 1    Past Surgical History:  Procedure Laterality Date  . ABDOMINAL HYSTERECTOMY    . APPENDECTOMY    . BREAST LUMPECTOMY  05/17/2008   x 2 re excise for margin - Dr Margot Chimes,  bilateral mastectomy 08/2008  . BREAST LUMPECTOMY W/ NEEDLE LOCALIZATION  04/23/2008   w SLN Dr Margot Chimes  . CESAREAN SECTION  01/01/2004   Dr Quincy Simmonds  . ENDOMETRIAL BIOPSY  06/2013, 2015   x 2  . EYE SURGERY  2003   bilateral lasik  . HYSTEROSCOPY W/D&C  07/21/2011   Procedure: DILATATION AND CURETTAGE (D&C) /HYSTEROSCOPY;  Surgeon: Arloa Koh;  Location: [facility];  Service: Gynecology;  Laterality: N/A;  with Ultrasound Guidance  . INSERTION OF TISSUE EXPANDER AFTER MASTECTOMY  09/11/2008   Dr Harlow Mares  . KNEE ARTHROSCOPY W/ MENISCAL REPAIR Right 2010   --Dr. Telford Nab  . LAPAROSCOPIC ASSISTED VAGINAL HYSTERECTOMY N/A 01/15/2015   Procedure: LAPAROSCOPIC ASSISTED VAGINAL HYSTERECTOMY;  Surgeon: Nunzio Cobbs, MD;  Location: [facility];  Service: Gynecology;  Laterality: N/A;  . left foot surgery  2004  . left rotator cuff repair  01/2006  . MASTECTOMY  09/11/2008   bilateral - Dr Margot Chimes  . PORT-A-CATH REMOVAL  09/09/2009   Dr Margot Chimes  . PORTACATH PLACEMENT  05/17/2008   Dr Margot Chimes  . REMOVAL OF TISSUE EXPANDER AND PLACEMENT OF IMPLANT  12/2008  . SALPINGOOPHORECTOMY Bilateral 01/15/2015   Procedure:  BILATERAL SALPINGO OOPHORECTOMY;  Surgeon: Nunzio Cobbs, MD;  Location: [facility];  Service: Gynecology;  Laterality: Bilateral;  . UMBILICAL HERNIA REPAIR  09/09/2009   Dr Margot Chimes  . WISDOM TOOTH EXTRACTION      Current Outpatient Medications  Medication Sig Dispense Refill  . calcium carbonate (OS-CAL) 600 MG TABS tablet Take 600 mg by mouth 2 (two) times daily with a meal.    . cetirizine (ZYRTEC) 10 MG tablet Take 10 mg by mouth daily.      . folic acid (FOLVITE) 1 MG tablet TAKE 2 TABLETS BY MOUTH EVERY DAY 180 tablet 2  . GLUCOSAMINE PO Take 2 tablets by mouth daily.     . hydroxychloroquine (PLAQUENIL) 200 MG tablet Take 1 tablet (200 mg total) by mouth 2 (two) times daily. 60 tablet 2  . ibuprofen (ADVIL) 200 MG tablet Take 200 mg by mouth as needed.    . Magnesium 500 MG  TABS Take 1 tablet by mouth 2 (two) times daily.    . Manganese 50 MG TABS Take 1 tablet by mouth daily.    . mometasone (NASONEX) 50 MCG/ACT nasal spray Place 2 sprays into the nose as needed.     . Plant Sterols and Stanols (CHOLESTOFF PO) Take by mouth.     No current facility-administered medications for this visit.     Family History  Problem Relation Age of Onset  . Breast cancer Mother   . Diabetes Mother   . Hypertension Mother   . Stroke Mother   . Irritable bowel syndrome Mother   . Kidney disease Mother   . Heart failure Father   . Hypertension Father     Review of Systems  Constitutional: Negative.   HENT: Negative.   Eyes: Negative.   Respiratory: Negative.   Cardiovascular: Negative.   Gastrointestinal: Negative.   Endocrine: Negative.   Genitourinary: Negative.   Musculoskeletal: Negative.   Skin: Negative.   Allergic/Immunologic: Negative.   Neurological: Negative.   Hematological: Negative.   Psychiatric/Behavioral: Negative.     Exam:   BP (!) 100/58 (BP Location: Right Arm, Patient Position: Sitting, Cuff Size: Normal)   Pulse 60   Resp 16   Ht 5' 5.5" (1.664 m)   Wt 158 lb (71.7 kg)   LMP 11/12/2014   BMI 25.89 kg/m     General appearance: alert, cooperative and appears stated age Head: Normocephalic, without obvious abnormality, atraumatic Neck: no adenopathy, supple, symmetrical, trachea midline and thyroid normal to inspection and palpation Lungs: clear to auscultation bilaterally Breasts:  Absent with breast reconstruction present.  No axillary or supraclavicular adenopathy Heart: regular rate and rhythm Abdomen: soft, non-tender; no masses, no organomegaly Extremities: extremities normal, atraumatic, no cyanosis or edema Skin: Skin color, texture, turgor normal. No rashes or lesions Lymph nodes: Cervical, supraclavicular, and axillary nodes normal. No abnormal inguinal nodes palpated Neurologic: Grossly normal  Pelvic: External  genitalia:  no lesions              Urethra:  normal appearing urethra with no masses, tenderness or lesions              Bartholins and Skenes: normal                 Vagina: normal appearing vagina with normal color and discharge, no lesions              Cervix:  absent  Pap taken: No. Bimanual Exam:  Uterus:   absent              Adnexa: no mass, fullness, tenderness              Rectal exam: Yes.  .  Confirms.              Anus:  normal sphincter tone, no lesions  Chaperone was present for exam.  Assessment:   Well woman visit with normal exam. Status post bilateral mastectomy with reconstruction.  Status post LAVH/BSO.  Osteopenia.  RA.  Plan: Mammogram screening. Recommended self breast awareness. Pap and HR HPV as above. Guidelines for Calcium, Vitamin D, regular exercise program including cardiovascular and weight bearing exercise. BMD through rheumatology.  Routine labs here today.  Does not need CMP.  Follow up annually and prn.   After visit summary provided.

## 2018-02-07 NOTE — Patient Instructions (Signed)

## 2018-02-08 LAB — CBC WITH DIFFERENTIAL/PLATELET
BASOS: 0 %
Basophils Absolute: 0 10*3/uL (ref 0.0–0.2)
EOS (ABSOLUTE): 0.2 10*3/uL (ref 0.0–0.4)
EOS: 3 %
HEMATOCRIT: 36.9 % (ref 34.0–46.6)
HEMOGLOBIN: 12 g/dL (ref 11.1–15.9)
IMMATURE GRANS (ABS): 0 10*3/uL (ref 0.0–0.1)
IMMATURE GRANULOCYTES: 0 %
LYMPHS: 26 %
Lymphocytes Absolute: 1.6 10*3/uL (ref 0.7–3.1)
MCH: 27.5 pg (ref 26.6–33.0)
MCHC: 32.5 g/dL (ref 31.5–35.7)
MCV: 85 fL (ref 79–97)
MONOCYTES: 8 %
Monocytes Absolute: 0.5 10*3/uL (ref 0.1–0.9)
NEUTROS PCT: 63 %
Neutrophils Absolute: 3.9 10*3/uL (ref 1.4–7.0)
Platelets: 264 10*3/uL (ref 150–450)
RBC: 4.36 x10E6/uL (ref 3.77–5.28)
RDW: 15.8 % — ABNORMAL HIGH (ref 12.3–15.4)
WBC: 6.2 10*3/uL (ref 3.4–10.8)

## 2018-02-08 LAB — LIPID PANEL
CHOLESTEROL TOTAL: 200 mg/dL — AB (ref 100–199)
Chol/HDL Ratio: 3.4 ratio (ref 0.0–4.4)
HDL: 58 mg/dL (ref >39)
LDL Calculated: 120 mg/dL — ABNORMAL HIGH (ref 0–99)
TRIGLYCERIDES: 112 mg/dL (ref 0–149)
VLDL Cholesterol Cal: 22 mg/dL (ref 5–40)

## 2018-02-08 LAB — VITAMIN D 25 HYDROXY (VIT D DEFICIENCY, FRACTURES): Vit D, 25-Hydroxy: 52.9 ng/mL (ref 30.0–100.0)

## 2018-02-08 LAB — TSH: TSH: 1.2 u[IU]/mL (ref 0.450–4.500)

## 2018-02-28 ENCOUNTER — Ambulatory Visit: Payer: 59 | Admitting: Obstetrics and Gynecology

## 2018-02-28 ENCOUNTER — Encounter: Payer: Self-pay | Admitting: Obstetrics and Gynecology

## 2018-02-28 ENCOUNTER — Telehealth: Payer: Self-pay | Admitting: Obstetrics and Gynecology

## 2018-02-28 ENCOUNTER — Other Ambulatory Visit: Payer: Self-pay

## 2018-02-28 VITALS — BP 90/56 | HR 64 | Resp 12 | Ht 67.0 in | Wt 157.0 lb

## 2018-02-28 DIAGNOSIS — N764 Abscess of vulva: Secondary | ICD-10-CM

## 2018-02-28 MED ORDER — SULFAMETHOXAZOLE-TRIMETHOPRIM 800-160 MG PO TABS: 1.0000 | ORAL_TABLET | Freq: Two times a day (BID) | ORAL | 0 refills | Status: DC

## 2018-02-28 NOTE — Telephone Encounter (Signed)
Appointment is scheduled. Encounter closed.

## 2018-02-28 NOTE — Telephone Encounter (Signed)
Patient called and requested an appointment with Dr. Quincy Simmonds for today. She said she has bumps on her bottom that she's not had before.

## 2018-02-28 NOTE — Telephone Encounter (Signed)
[place] for appointment time.

## 2018-02-28 NOTE — Telephone Encounter (Signed)
Spoke with patient. Patient states that she woke up yesterday and that noticed a few bumps where her vagina and bottom meet. Woke up today and there are more. They are painful to the touch and appear fluid filled. Reports the area is red and irritated. Patient is concerned due to spreading. Appointment scheduled for today at 12 pm with Dr.Silva. Patient is agreeable to date and time.  Dr. Quincy Simmonds, is this time okay?

## 2018-02-28 NOTE — Patient Instructions (Signed)

## 2018-02-28 NOTE — Progress Notes (Signed)
GYNECOLOGY  VISIT   HPI: 52 y.o.   Married  Caucasian  female   G2P2 with Patient's last menstrual period was 11/12/2014.   here for   Vaginal lumps.  Noticed a lump 3 days ago and feeling discomfort.  Thought it was a pimple that popped and drained.  Noticing a little blood now.  Is painful with wiping.   No fever.  Did not feel well this weekend after going to cookout.   GYNECOLOGIC HISTORY: Patient's last menstrual period was 11/12/2014. Contraception:  Hysterectomy  Menopausal hormone therapy:  none Last mammogram:  2009 bilateral mastectomy  Last pap smear:   08-01-14 negative, HR HPV negative         OB History    Gravida  2   Para  2   Term      Preterm      AB      Living  3     SAB      TAB      Ectopic      Multiple  1   Live Births  3        Obstetric Comments  Pt was a surrogate for the twins           Patient Active Problem List   Diagnosis Date Noted  . Primary osteoarthritis of both knees 09/01/2017  . Scoliosis 09/01/2017  . High risk medication use 11/11/2016  . Primary osteoarthritis of both hands 11/11/2016  . Lateral epicondylitis, right elbow 11/11/2016  . Myopia with astigmatism and presbyopia, bilateral 09/09/2016  . 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 Status post laparoscopic assisted vaginal hysterectomy (LAVH) 01/15/2015  . Breast cancer, left breast ([place]) 11/07/2014  . Postmenopausal bleeding 07/13/2013  . Rheumatoid arthritis ([place]) 01/11/2012  . Psoriasis 01/11/2012    Past Medical History:  Diagnosis Date  . Abnormal uterine bleeding   . Anemia   . Arthritis   . Asthma    exercised induced asthma - rarely uses inhaler  . Blood transfusion without reported diagnosis 1976   1976  following surgery at [facility]   . Cancer ([place]) 08/2008   left breast--has bilateral mastectomy  . Complication of anesthesia    pt states" gets shakes" with general  . Elevated LDL cholesterol level   .  Environmental and seasonal allergies   . Fibroid   . Headache    sleeps , no meds  . Heart murmur    never has caused any problems  . History of breast cancer   . RA (rheumatoid arthritis) ([place])   . Scoliosis    tx with robaxin prn - rarely uses  . SVD (spontaneous vaginal delivery) 1996   x 1    Past Surgical History:  Procedure Laterality Date  . ABDOMINAL HYSTERECTOMY    . APPENDECTOMY    . BREAST LUMPECTOMY  05/17/2008   x 2 re excise for margin - Dr Margot Chimes, bilateral mastectomy 08/2008  . BREAST LUMPECTOMY W/ NEEDLE LOCALIZATION  04/23/2008   w SLN Dr Margot Chimes  . CESAREAN SECTION  01/01/2004   Dr Quincy Simmonds  . ENDOMETRIAL BIOPSY  06/2013, 2015   x 2  . EYE SURGERY  2003   bilateral lasik  . HYSTEROSCOPY W/D&C  07/21/2011   Procedure: DILATATION AND CURETTAGE (D&C) /HYSTEROSCOPY;  Surgeon: Arloa Koh;  Location: [facility];  Service: Gynecology;  Laterality: N/A;  with Ultrasound Guidance  . INSERTION OF TISSUE EXPANDER AFTER MASTECTOMY  09/11/2008  Dr Harlow Mares  . KNEE ARTHROSCOPY W/ MENISCAL REPAIR Right 2010   --Dr. Telford Nab  . LAPAROSCOPIC ASSISTED VAGINAL HYSTERECTOMY N/A 01/15/2015   Procedure: LAPAROSCOPIC ASSISTED VAGINAL HYSTERECTOMY;  Surgeon: Nunzio Cobbs, MD;  Location: [facility];  Service: Gynecology;  Laterality: N/A;  . left foot surgery  2004  . left rotator cuff repair  01/2006  . MASTECTOMY  09/11/2008   bilateral - Dr Margot Chimes  . PORT-A-CATH REMOVAL  09/09/2009   Dr Margot Chimes  . PORTACATH PLACEMENT  05/17/2008   Dr Margot Chimes  . REMOVAL OF TISSUE EXPANDER AND PLACEMENT OF IMPLANT  12/2008  . SALPINGOOPHORECTOMY Bilateral 01/15/2015   Procedure: BILATERAL SALPINGO OOPHORECTOMY;  Surgeon: Nunzio Cobbs, MD;  Location: [facility];  Service: Gynecology;  Laterality: Bilateral;  . UMBILICAL HERNIA REPAIR  09/09/2009   Dr Margot Chimes  . WISDOM TOOTH EXTRACTION      Current Outpatient Medications  Medication Sig Dispense Refill  . calcium carbonate (OS-CAL) 600 MG  TABS tablet Take 600 mg by mouth 2 (two) times daily with a meal.    . cetirizine (ZYRTEC) 10 MG tablet Take 10 mg by mouth daily.      . folic acid (FOLVITE) 1 MG tablet TAKE 2 TABLETS BY MOUTH EVERY DAY 180 tablet 2  . GLUCOSAMINE PO Take 2 tablets by mouth daily.     . hydroxychloroquine (PLAQUENIL) 200 MG tablet Take 1 tablet (200 mg total) by mouth 2 (two) times daily. 60 tablet 2  . ibuprofen (ADVIL) 200 MG tablet Take 200 mg by mouth as needed.    . Magnesium 500 MG TABS Take 1 tablet by mouth 2 (two) times daily.    . Manganese 50 MG TABS Take 1 tablet by mouth daily.    . mometasone (NASONEX) 50 MCG/ACT nasal spray Place 2 sprays into the nose as needed.     . Plant Sterols and Stanols (CHOLESTOFF PO) Take by mouth.     No current facility-administered medications for this visit.      ALLERGIES: Latex; Other; and Percocet [oxycodone-acetaminophen]  Family History  Problem Relation Age of Onset  . Breast cancer Mother   . Diabetes Mother   . Hypertension Mother   . Stroke Mother   . Irritable bowel syndrome Mother   . Kidney disease Mother   . Heart failure Father   . Hypertension Father     Social History   Socioeconomic History  . Marital status: Married    Spouse name: Not on file  . Number of children: 1  . Years of education: Not on file  . Highest education level: Not on file  Occupational History  . Not on file  Social Needs  . Financial resource strain: Not on file  . Food insecurity:    Worry: Not on file    Inability: Not on file  . Transportation needs:    Medical: Not on file    Non-medical: Not on file  Tobacco Use  . Smoking status: Never Smoker  . Smokeless tobacco: Never Used  Substance and Sexual Activity  . Alcohol use: Yes    Alcohol/week: 0.6 oz    Types: 1 Standard drinks or equivalent per week    Comment: occ glass of wine or beer  . Drug use: No  . Sexual activity: Yes    Partners: Male    Birth control/protection:  Post-menopausal    Comment: Hyst/BSO  Lifestyle  . Physical activity:    Days per  week: Not on file    Minutes per session: Not on file  . Stress: Not on file  Relationships  . Social connections:    Talks on phone: Not on file    Gets together: Not on file    Attends religious service: Not on file    Active member of club or organization: Not on file    Attends meetings of clubs or organizations: Not on file    Relationship status: Not on file  . Intimate partner violence:    Fear of current or ex partner: Not on file    Emotionally abused: Not on file    Physically abused: Not on file    Forced sexual activity: Not on file  Other Topics Concern  . Not on file  Social History Narrative  . Not on file    Review of Systems  Constitutional: Negative.   HENT: Negative.   Eyes: Negative.   Respiratory: Negative.   Cardiovascular: Negative.   Gastrointestinal: Negative.   Endocrine: Negative.   Genitourinary:       Vulvar/vaginal lumps  Musculoskeletal: Negative.   Skin: Negative.   Allergic/Immunologic: Negative.   Neurological: Negative.   Hematological: Negative.   Psychiatric/Behavioral: Negative.     PHYSICAL EXAMINATION:    BP (!) 90/56 (BP Location: Right Arm, Patient Position: Sitting, Cuff Size: Normal)   Pulse 64   Resp 12   Ht 5\' 7"  (1.702 m)   Wt 157 lb (71.2 kg)   LMP 11/12/2014   BMI 24.59 kg/m     General appearance: alert, cooperative and appears stated age   Pelvic: External genitalia:   Draining small 0.6 mm abscess of right labia majora.  Thickness of the right medial thigh adjacent to this - 1 cm area, no focal pointing area so not drained.               Urethra:  normal appearing urethra with no masses, tenderness or lesions              Bartholins and Skenes: normal      Chaperone was present for exam.  ASSESSMENT  Vulvar/thigh abscesses.   PLAN  Discussed potential for MRSA.  Wound culture done.  Start bactrim DS po bid x 7 days.   Warm compresses. Return if not improved or if worsening.    An After Visit Summary was printed and given to the patient.  ___15___ minutes face to face time of which over 50% was spent in counseling.

## 2018-03-02 LAB — WOUND CULTURE: ORGANISM ID, BACTERIA: NONE SEEN

## 2018-03-06 ENCOUNTER — Encounter: Payer: Self-pay | Admitting: Obstetrics and Gynecology

## 2018-03-07 ENCOUNTER — Telehealth: Payer: Self-pay | Admitting: Rheumatology

## 2018-03-07 ENCOUNTER — Encounter: Payer: Self-pay | Admitting: Obstetrics and Gynecology

## 2018-03-07 ENCOUNTER — Other Ambulatory Visit: Payer: Self-pay

## 2018-03-07 ENCOUNTER — Telehealth: Payer: Self-pay | Admitting: Obstetrics and Gynecology

## 2018-03-07 ENCOUNTER — Ambulatory Visit: Payer: 59 | Admitting: Obstetrics and Gynecology

## 2018-03-07 VITALS — BP 110/60 | HR 68 | Resp 16 | Ht 67.0 in | Wt 158.0 lb

## 2018-03-07 DIAGNOSIS — R102 Pelvic and perineal pain: Secondary | ICD-10-CM | POA: Diagnosis not present

## 2018-03-07 DIAGNOSIS — R21 Rash and other nonspecific skin eruption: Secondary | ICD-10-CM | POA: Diagnosis not present

## 2018-03-07 MED ORDER — VALACYCLOVIR HCL 1 G PO TABS: 1000.0000 mg | ORAL_TABLET | Freq: Two times a day (BID) | ORAL | 0 refills | Status: DC

## 2018-03-07 NOTE — Telephone Encounter (Signed)
Patient states she spoke with Dr. Lorin Mercy on the phone yesterday with the on call. Patient states she was having severe pain in your lower back, right hip and down right thigh. Patient was advised to go to see Zonia Kief. Patient states she had x-rays performed and gave them to her on a disc. Patient was given a prednisone taper and muscle relaxer. Patient was advised to make a follow up with [place]. Patient has also seen Dr. Durward Fortes. Patient states she is on PLQ and not missing any doses. Please advise.

## 2018-03-07 NOTE — Patient Instructions (Signed)
Shingles Shingles, which is also known as herpes zoster, is an infection that causes a painful skin rash and fluid-filled blisters. Shingles is not related to genital herpes, which is a sexually transmitted infection. Shingles only develops in people who:  Have had chickenpox.  Have received the chickenpox vaccine. (This is rare.)  What are the causes? Shingles is caused by varicella-zoster virus (VZV). This is the same virus that causes chickenpox. After exposure to VZV, the virus stays in the body in an inactive (dormant) state. Shingles develops if the virus reactivates. This can happen many years after the initial exposure to VZV. It is not known what causes this virus to reactivate. What increases the risk? People who have had chickenpox or received the chickenpox vaccine are at risk for shingles. Infection is more common in people who:  Are older than age 50.  Have a weakened defense (immune) system, such as those with HIV, AIDS, or cancer.  Are taking medicines that weaken the immune system, such as transplant medicines.  Are under great stress.  What are the signs or symptoms? Early symptoms of this condition include itching, tingling, and pain in an area on your skin. Pain may be described as burning, stabbing, or throbbing. A few days or weeks after symptoms start, a painful red rash appears, usually on one side of the body in a bandlike or beltlike pattern. The rash eventually turns into fluid-filled blisters that break open, scab over, and dry up in about 2-3 weeks. At any time during the infection, you may also develop:  A fever.  Chills.  A headache.  An upset stomach.  How is this diagnosed? This condition is diagnosed with a skin exam. Sometimes, skin or fluid samples are taken from the blisters before a diagnosis is made. These samples are examined under a microscope or sent to a lab for testing. How is this treated? There is no specific cure for this condition.  Your health care provider will probably prescribe medicines to help you manage pain, recover more quickly, and avoid long-term problems. Medicines may include:  Antiviral drugs.  Anti-inflammatory drugs.  Pain medicines.  If the area involved is on your face, you may be referred to a specialist, such as an eye doctor (ophthalmologist) or an ear, nose, and throat (ENT) doctor to help you avoid eye problems, chronic pain, or disability. Follow these instructions at home: Medicines  Take medicines only as directed by your health care provider.  Apply an anti-itch or numbing cream to the affected area as directed by your health care provider. Blister and Rash Care  Take a cool bath or apply cool compresses to the area of the rash or blisters as directed by your health care provider. This may help with pain and itching.  Keep your rash covered with a loose bandage (dressing). Wear loose-fitting clothing to help ease the pain of material rubbing against the rash.  Keep your rash and blisters clean with mild soap and cool water or as directed by your health care provider.  Check your rash every day for signs of infection. These include redness, swelling, and pain that lasts or increases.  Do not pick your blisters.  Do not scratch your rash. General instructions  Rest as directed by your health care provider.  Keep all follow-up visits as directed by your health care provider. This is important.  Until your blisters scab over, your infection can cause chickenpox in people who have never had it or been vaccinated   against it. To prevent this from happening, avoid contact with other people, especially: ? Babies. ? Pregnant women. ? Children who have eczema. ? Elderly people who have transplants. ? People who have chronic illnesses, such as leukemia or AIDS. Contact a health care provider if:  Your pain is not relieved with prescribed medicines.  Your pain does not get better after  the rash heals.  Your rash looks infected. Signs of infection include redness, swelling, and pain that lasts or increases. Get help right away if:  The rash is on your face or nose.  You have facial pain, pain around your eye area, or loss of feeling on one side of your face.  You have ear pain or you have ringing in your ear.  You have loss of taste.  Your condition gets worse. This information is not intended to replace advice given to you by your health care provider. Make sure you discuss any questions you have with your health care provider. Document Released: 09/07/2005 Document Revised: 05/03/2016 Document Reviewed: 07/19/2014 Elsevier Interactive Patient Education  2018 Elsevier Inc.  

## 2018-03-07 NOTE — Telephone Encounter (Signed)
Please schedule appointment.

## 2018-03-07 NOTE — Progress Notes (Deleted)
Office Visit Note  Patient: Rebecca Fleming             Date of Birth: 01-10-1966           MRN: [id number]             PCP: Nunzio Cobbs, MD Referring: Aundria Rud* Visit Date: 03/09/2018 Occupation: @GUAROCC @    Subjective:  No chief complaint on file.   History of Present Illness: Rebecca Fleming is a 52 y.o. female ***   Activities of Daily Living:  Patient reports morning stiffness for *** {minute/hour:19697}.   Patient {ACTIONS;DENIES/REPORTS:21021675::"Denies"} nocturnal pain.  Difficulty dressing/grooming: {ACTIONS;DENIES/REPORTS:21021675::"Denies"} Difficulty climbing stairs: {ACTIONS;DENIES/REPORTS:21021675::"Denies"} Difficulty getting out of chair: {ACTIONS;DENIES/REPORTS:21021675::"Denies"} Difficulty using hands for taps, buttons, cutlery, and/or writing: {ACTIONS;DENIES/REPORTS:21021675::"Denies"}   No Rheumatology ROS completed.   PMFS History:  Patient Active Problem List   Diagnosis Date Noted  . Primary osteoarthritis of both knees 09/01/2017  . Scoliosis 09/01/2017  . High risk medication use 11/11/2016  . Primary osteoarthritis of both hands 11/11/2016  . Lateral epicondylitis, right elbow 11/11/2016  . Myopia with astigmatism and presbyopia, bilateral 09/09/2016  . 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 Status post laparoscopic assisted vaginal hysterectomy (LAVH) 01/15/2015  . Breast cancer, left breast ([place]) 11/07/2014  . Postmenopausal bleeding 07/13/2013  . Rheumatoid arthritis ([place]) 01/11/2012  . Psoriasis 01/11/2012    Past Medical History:  Diagnosis Date  . Abnormal uterine bleeding   . Anemia   . Arthritis   . Asthma    exercised induced asthma - rarely uses inhaler  . Blood transfusion without reported diagnosis 1976   1976  following surgery at [facility]   . Cancer ([place]) 08/2008   left breast--has bilateral mastectomy  . Complication of anesthesia    pt states" gets shakes" with general  .  Elevated LDL cholesterol level   . Environmental and seasonal allergies   . Fibroid   . Headache    sleeps , no meds  . Heart murmur    never has caused any problems  . History of breast cancer   . RA (rheumatoid arthritis) ([place])   . Scoliosis    tx with robaxin prn - rarely uses  . SVD (spontaneous vaginal delivery) 1996   x 1    Family History  Problem Relation Age of Onset  . Breast cancer Mother   . Diabetes Mother   . Hypertension Mother   . Stroke Mother   . Irritable bowel syndrome Mother   . Kidney disease Mother   . Heart failure Father   . Hypertension Father    Past Surgical History:  Procedure Laterality Date  . ABDOMINAL HYSTERECTOMY    . APPENDECTOMY    . BREAST LUMPECTOMY  05/17/2008   x 2 re excise for margin - Dr Margot Chimes, bilateral mastectomy 08/2008  . BREAST LUMPECTOMY W/ NEEDLE LOCALIZATION  04/23/2008   w SLN Dr Margot Chimes  . CESAREAN SECTION  01/01/2004   Dr Quincy Simmonds  . ENDOMETRIAL BIOPSY  06/2013, 2015   x 2  . EYE SURGERY  2003   bilateral lasik  . HYSTEROSCOPY W/D&C  07/21/2011   Procedure: DILATATION AND CURETTAGE (D&C) /HYSTEROSCOPY;  Surgeon: Arloa Koh;  Location: [facility];  Service: Gynecology;  Laterality: N/A;  with Ultrasound Guidance  . INSERTION OF TISSUE EXPANDER AFTER MASTECTOMY  09/11/2008   Dr Harlow Mares  . KNEE ARTHROSCOPY W/ MENISCAL REPAIR Right 2010   --Dr. Telford Nab  .  LAPAROSCOPIC ASSISTED VAGINAL HYSTERECTOMY N/A 01/15/2015   Procedure: LAPAROSCOPIC ASSISTED VAGINAL HYSTERECTOMY;  Surgeon: Nunzio Cobbs, MD;  Location: [facility];  Service: Gynecology;  Laterality: N/A;  . left foot surgery  2004  . left rotator cuff repair  01/2006  . MASTECTOMY  09/11/2008   bilateral - Dr Margot Chimes  . PORT-A-CATH REMOVAL  09/09/2009   Dr Margot Chimes  . PORTACATH PLACEMENT  05/17/2008   Dr Margot Chimes  . REMOVAL OF TISSUE EXPANDER AND PLACEMENT OF IMPLANT  12/2008  . SALPINGOOPHORECTOMY Bilateral 01/15/2015   Procedure: BILATERAL SALPINGO OOPHORECTOMY;   Surgeon: Nunzio Cobbs, MD;  Location: [facility];  Service: Gynecology;  Laterality: Bilateral;  . UMBILICAL HERNIA REPAIR  09/09/2009   Dr Margot Chimes  . WISDOM TOOTH EXTRACTION     Social History   Social History Narrative  . Not on file     Objective: Vital Signs: LMP 11/12/2014    Physical Exam   Musculoskeletal Exam: ***  CDAI Exam: No CDAI exam completed.    Investigation: No additional findings. CBC Latest Ref Rng & Units 02/07/2018 01/10/2018 03/31/2017  WBC 3.4 - 10.8 x10E3/uL 6.2 3.5(L) 4.8  Hemoglobin 11.1 - 15.9 g/dL 12.0 11.8 13.0  Hematocrit 34.0 - 46.6 % 36.9 36.0 39.1  Platelets 150 - 450 x10E3/uL 264 225 280   CMP Latest Ref Rng & Units 01/10/2018 03/31/2017 02/03/2017  Glucose 65 - 99 mg/dL 99 85 64(L)  BUN 7 - 25 mg/dL 9 13 12   Creatinine 0.50 - 1.05 mg/dL 0.81 0.74 0.79  Sodium 135 - 146 mmol/L 142 139 141  Potassium 3.5 - 5.3 mmol/L 4.4 4.6 4.4  Chloride 98 - 110 mmol/L 107 103 106  CO2 20 - 32 mmol/L 29 26 25   Calcium 8.6 - 10.4 mg/dL 9.2 9.8 9.4  Total Protein 6.1 - 8.1 g/dL 6.2 6.7 6.5  Total Bilirubin 0.2 - 1.2 mg/dL 0.4 0.5 0.7  Alkaline Phos 33 - 130 U/L - 66 62  AST 10 - 35 U/L 16 17 17   ALT 6 - 29 U/L 12 14 14      Imaging: No results found.  Speciality Comments: PLQ Eye Exam: 09/16/17 WNL @ [place]     Procedures:  No procedures performed Allergies: Latex; Other; and Percocet [oxycodone-acetaminophen]   Assessment / Plan:     Visit Diagnoses: Rheumatoid arthritis involving multiple sites with positive rheumatoid factor (HCC) - RF+, CCP+  High risk medication use - PLQ 200mg  po bidPLQ Eye Exam: 09/16/17 WNL @ [facility]   Primary osteoarthritis of both hands  Primary osteoarthritis of both knees  Effusion of left knee  Psoriasis  Lateral epicondylitis, right elbow  Scoliosis, unspecified scoliosis type, unspecified spinal region  History of breast cancer  History of asthma  History of  anemia  Heart murmur  Chondromalacia of left patella    Orders: No orders of the defined types were placed in this encounter.  No orders of the defined types were placed in this encounter.   Face-to-face time spent with patient was *** minutes. 50% of time was spent in counseling and coordination of care.  Follow-Up Instructions: No follow-ups on file.   Ofilia Neas, PA-C  Note - This record has been created using Dragon software.  Chart creation errors have been sought, but may not always  have been located. Such creation errors do not reflect on  the standard of medical care.

## 2018-03-07 NOTE — Telephone Encounter (Signed)
-----   Message from [place], Generic sent at 03/06/2018 12:44 PM EDT -----    I have a question about CULTURE, WOUND resulted on 03/02/18, 5:35 PM.    Dr. Quincy Simmonds,  The small spot cleared up (the one you cultured). The larger one is still there and looks unchanged to me. I've also now developed a red & burning rash on the inside of my right thigh.     I'm currently at [facility] due to severe lower back, hip and thigh pain on my right side. It does radiate to my right pelvis too.     I'm not sure if any of this is related to each other but wanted to let you know

## 2018-03-07 NOTE — Telephone Encounter (Signed)
Patient left a voicemail stating she had severe back and leg pain over the weekend and went to [facility].  Patient states they prescribed Prednisone and to contact Dr. Estanislado Pandy.

## 2018-03-07 NOTE — Telephone Encounter (Signed)
Patient has been scheduled for 03/09/18 at 2:30 pm.

## 2018-03-07 NOTE — Progress Notes (Signed)
GYNECOLOGY  VISIT   HPI: 52 y.o.   Married  Caucasian  female   G2P2 with Patient's last menstrual period was 11/12/2014.   here for problem visit. Patient states that vulvar lump still there but now has a rash on right inner thigh and top of pelvis.    Started Bactrim DS 02/28/18 for vulvar/thigh abscesses.  Took last dose this am. One is gone and one is filled with blood per patient.   Wound culture is negative.  States she is feeling something like a menstrual cramp.   Started having back, right hip and right thigh pain.  Went to urgent care.  They started her on a prednisone dose back.  Sees her rheumatolgist in 2 days.  She is on Plaquenil.  Now has a rash on her vulva and pelvic bone.    GYNECOLOGIC HISTORY: Patient's last menstrual period was 11/12/2014. Contraception:  Hysterectomy Menopausal hormone therapy:  none Last mammogram:  2009 bilateral mastectomy Last pap smear:   08/01/14 Neg: HR HPV negative        OB History    Gravida  2   Para  2   Term      Preterm      AB      Living  3     SAB      TAB      Ectopic      Multiple  1   Live Births  3        Obstetric Comments  Pt was a surrogate for the twins           Patient Active Problem List   Diagnosis Date Noted  . Primary osteoarthritis of both knees 09/01/2017  . Scoliosis 09/01/2017  . High risk medication use 11/11/2016  . Primary osteoarthritis of both hands 11/11/2016  . Lateral epicondylitis, right elbow 11/11/2016  . Myopia with astigmatism and presbyopia, bilateral 09/09/2016  . 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 Status post laparoscopic assisted vaginal hysterectomy (LAVH) 01/15/2015  . Breast cancer, left breast ([place]) 11/07/2014  . Postmenopausal bleeding 07/13/2013  . Rheumatoid arthritis ([place]) 01/11/2012  . Psoriasis 01/11/2012    Past Medical History:  Diagnosis Date  . Abnormal uterine bleeding   . Anemia   . Arthritis   . Asthma     exercised induced asthma - rarely uses inhaler  . Blood transfusion without reported diagnosis 1976   1976  following surgery at [facility]   . Cancer ([place]) 08/2008   left breast--has bilateral mastectomy  . Complication of anesthesia    pt states" gets shakes" with general  . Elevated LDL cholesterol level   . Environmental and seasonal allergies   . Fibroid   . Headache    sleeps , no meds  . Heart murmur    never has caused any problems  . History of breast cancer   . RA (rheumatoid arthritis) ([place])   . Scoliosis    tx with robaxin prn - rarely uses  . SVD (spontaneous vaginal delivery) 1996   x 1    Past Surgical History:  Procedure Laterality Date  . ABDOMINAL HYSTERECTOMY    . APPENDECTOMY    . BREAST LUMPECTOMY  05/17/2008   x 2 re excise for margin - Dr Margot Chimes, bilateral mastectomy 08/2008  . BREAST LUMPECTOMY W/ NEEDLE LOCALIZATION  04/23/2008   w [facility] Dr Margot Chimes  . CESAREAN SECTION  01/01/2004   Dr Quincy Simmonds  . ENDOMETRIAL BIOPSY  06/2013, 2015   x 2  . EYE SURGERY  2003   bilateral lasik  . HYSTEROSCOPY W/D&C  07/21/2011   Procedure: DILATATION AND CURETTAGE (D&C) /HYSTEROSCOPY;  Surgeon: Arloa Koh;  Location: [facility];  Service: Gynecology;  Laterality: N/A;  with Ultrasound Guidance  . INSERTION OF TISSUE EXPANDER AFTER MASTECTOMY  09/11/2008   Dr Harlow Mares  . KNEE ARTHROSCOPY W/ MENISCAL REPAIR Right 2010   --Dr. Telford Nab  . LAPAROSCOPIC ASSISTED VAGINAL HYSTERECTOMY N/A 01/15/2015   Procedure: LAPAROSCOPIC ASSISTED VAGINAL HYSTERECTOMY;  Surgeon: Nunzio Cobbs, MD;  Location: [facility];  Service: Gynecology;  Laterality: N/A;  . left foot surgery  2004  . left rotator cuff repair  01/2006  . MASTECTOMY  09/11/2008   bilateral - Dr Margot Chimes  . PORT-A-CATH REMOVAL  09/09/2009   Dr Margot Chimes  . PORTACATH PLACEMENT  05/17/2008   Dr Margot Chimes  . REMOVAL OF TISSUE EXPANDER AND PLACEMENT OF IMPLANT  12/2008  . SALPINGOOPHORECTOMY Bilateral 01/15/2015   Procedure: BILATERAL  SALPINGO OOPHORECTOMY;  Surgeon: Nunzio Cobbs, MD;  Location: [facility];  Service: Gynecology;  Laterality: Bilateral;  . UMBILICAL HERNIA REPAIR  09/09/2009   Dr Margot Chimes  . WISDOM TOOTH EXTRACTION      Current Outpatient Medications  Medication Sig Dispense Refill  . calcium carbonate (OS-CAL) 600 MG TABS tablet Take 600 mg by mouth 2 (two) times daily with a meal.    . cetirizine (ZYRTEC) 10 MG tablet Take 10 mg by mouth daily.      . cyclobenzaprine (FLEXERIL) 5 MG tablet Take 5 mg by mouth as needed for muscle spasms.    . folic acid (FOLVITE) 1 MG tablet TAKE 2 TABLETS BY MOUTH EVERY DAY 180 tablet 2  . GLUCOSAMINE PO Take 2 tablets by mouth daily.     . hydroxychloroquine (PLAQUENIL) 200 MG tablet Take 1 tablet (200 mg total) by mouth 2 (two) times daily. 60 tablet 2  . ibuprofen (ADVIL) 200 MG tablet Take 200 mg by mouth as needed.    . Magnesium 500 MG TABS Take 1 tablet by mouth 2 (two) times daily.    . Manganese 50 MG TABS Take 1 tablet by mouth daily.    . mometasone (NASONEX) 50 MCG/ACT nasal spray Place 2 sprays into the nose as needed.     . Plant Sterols and Stanols (CHOLESTOFF PO) Take by mouth.    . predniSONE (STERAPRED UNI-PAK 21 TAB) 10 MG (21) TBPK tablet Take by mouth daily.     No current facility-administered medications for this visit.      ALLERGIES: Latex; Other; and Percocet [oxycodone-acetaminophen]  Family History  Problem Relation Age of Onset  . Breast cancer Mother   . Diabetes Mother   . Hypertension Mother   . Stroke Mother   . Irritable bowel syndrome Mother   . Kidney disease Mother   . Heart failure Father   . Hypertension Father     Social History   Socioeconomic History  . Marital status: Married    Spouse name: Not on file  . Number of children: 1  . Years of education: Not on file  . Highest education level: Not on file  Occupational History  . Not on file  Social Needs  . Financial resource strain: Not on file  .  Food insecurity:    Worry: Not on file    Inability: Not on file  . Transportation needs:    Medical: Not  on file    Non-medical: Not on file  Tobacco Use  . Smoking status: Never Smoker  . Smokeless tobacco: Never Used  Substance and Sexual Activity  . Alcohol use: Yes    Alcohol/week: 0.6 oz    Types: 1 Standard drinks or equivalent per week    Comment: occ glass of wine or beer  . Drug use: No  . Sexual activity: Yes    Partners: Male    Birth control/protection: Post-menopausal    Comment: Hyst/BSO  Lifestyle  . Physical activity:    Days per week: Not on file    Minutes per session: Not on file  . Stress: Not on file  Relationships  . Social connections:    Talks on phone: Not on file    Gets together: Not on file    Attends religious service: Not on file    Active member of club or organization: Not on file    Attends meetings of clubs or organizations: Not on file    Relationship status: Not on file  . Intimate partner violence:    Fear of current or ex partner: Not on file    Emotionally abused: Not on file    Physically abused: Not on file    Forced sexual activity: Not on file  Other Topics Concern  . Not on file  Social History Narrative  . Not on file    Review of Systems  Constitutional: Negative.   HENT: Negative.   Eyes: Negative.   Cardiovascular: Negative.   Gastrointestinal: Positive for abdominal pain.       Bloating Change in quality and character of stools  Endocrine: Negative.   Genitourinary:       Vulvar lumps  Musculoskeletal:       Muscle or joint pain  Skin: Positive for rash.       Itching   Allergic/Immunologic: Negative.   Neurological: Negative.   Hematological: Negative.   Psychiatric/Behavioral: Negative.     PHYSICAL EXAMINATION:    BP 110/60 (BP Location: Right Arm, Patient Position: Sitting, Cuff Size: Normal)   Pulse 68   Resp 16   Ht 5\' 7"  (1.702 m)   Wt 158 lb (71.7 kg)   LMP 11/12/2014   BMI 24.75 kg/m      General appearance: alert, cooperative and appears stated age  No abnormal inguinal nodes palpated   Pelvic: External genitalia:  Right mons and right vulva with coalescing beef red vesicular lesions covering an area of approximately 4 x 8 cm in each area.  Tender to touch.  Right medial thigh with soft tissue mass ill defined and approximately 1.5 cm, skin and bluish color.               Urethra:  normal appearing urethra with no masses, tenderness or lesions              Bartholins and Skenes: normal                 Vagina: normal appearing vagina with normal color and discharge, no lesions              Cervix: no lesions                Bimanual Exam:  Uterus:  Absent.               Adnexa: no mass, fullness, tenderness              Rectal exam: Yes.  Marland Kitchen  Confirms.              Anus:  normal sphincter tone, no lesions  Chaperone was present for exam.  ASSESSMENT   Recent skin abscess.  Looks improved.  Nothing to drain today.  Final wound culture negative.  Treated with Bactrim DS.   Skin eruption.  I suspect shingles.  I asked Dr. Edwinna Areola to evaluate the rash as well, and she concerns with the diagnosis.  On Plaquenil.   PLAN  Affirm taken.  Start Valtrex 1000 mg po tid x 1 week.  Will facilitate an appointment with her dermatologist, Dr. Ledell Peoples office.  Return prn.    An After Visit Summary was printed and given to the patient.  ___15___ minutes face to face time of which over 50% was spent in counseling.

## 2018-03-07 NOTE — Telephone Encounter (Signed)
Spoke with patient. Patient states that she still has one bump on her vulva that has only gotten a little bit smaller since her visit on 6/10. Yesterday she began to develop a rash on the upper inside of her pelvic bone. Rash was burning yesterday. Has "pimple" like bumps in her pubic area. Has tried OTC Hydrocortisone cream without relief. Advised will need to be seen in the office for further evaluation. Patient is agreeable. Appointment scheduled for today at 3:30 pm with Dr.Silva. Patient is agreeable to date and time.  Routing to provider for final review. Patient agreeable to disposition. Will close encounter.

## 2018-03-08 LAB — POCT URINALYSIS DIPSTICK
Bilirubin, UA: NEGATIVE
GLUCOSE UA: NEGATIVE
KETONES UA: NEGATIVE
LEUKOCYTES UA: NEGATIVE
NITRITE UA: NEGATIVE
Protein, UA: NEGATIVE
RBC UA: NEGATIVE
Urobilinogen, UA: 0.2 E.U./dL
pH, UA: 5 (ref 5.0–8.0)

## 2018-03-08 LAB — VAGINITIS/VAGINOSIS, DNA PROBE
Candida Species: NEGATIVE
Gardnerella vaginalis: NEGATIVE
Trichomonas vaginosis: NEGATIVE

## 2018-03-09 ENCOUNTER — Ambulatory Visit: Payer: Self-pay | Admitting: Rheumatology

## 2018-03-21 HISTORY — PX: COLONOSCOPY: SHX174

## 2018-03-21 HISTORY — PX: UPPER GI ENDOSCOPY: SHX6162

## 2018-03-22 ENCOUNTER — Ambulatory Visit (AMBULATORY_SURGERY_CENTER): Payer: Self-pay

## 2018-03-22 VITALS — Ht 67.0 in | Wt 160.6 lb

## 2018-03-22 DIAGNOSIS — R1319 Other dysphagia: Secondary | ICD-10-CM

## 2018-03-22 DIAGNOSIS — Z1211 Encounter for screening for malignant neoplasm of colon: Secondary | ICD-10-CM

## 2018-03-22 DIAGNOSIS — R1310 Dysphagia, unspecified: Secondary | ICD-10-CM

## 2018-03-22 MED ORDER — NA SULFATE-K SULFATE-MG SULF 17.5-3.13-1.6 GM/177ML PO SOLN: 1.0000 | Freq: Once | ORAL | 0 refills | Status: AC

## 2018-03-22 NOTE — Progress Notes (Signed)
Per pt, no allergies to soy or egg products.Pt not taking any weight loss meds or using  O2 at home.  Pt refused emmi video. 

## 2018-03-30 ENCOUNTER — Encounter: Payer: Self-pay | Admitting: Gastroenterology

## 2018-03-30 ENCOUNTER — Other Ambulatory Visit: Payer: Self-pay

## 2018-03-30 ENCOUNTER — Ambulatory Visit (AMBULATORY_SURGERY_CENTER): Payer: 59 | Admitting: Gastroenterology

## 2018-03-30 VITALS — BP 119/79 | HR 67 | Temp 99.1°F | Resp 14 | Ht 67.0 in | Wt 160.0 lb

## 2018-03-30 DIAGNOSIS — R1310 Dysphagia, unspecified: Secondary | ICD-10-CM | POA: Diagnosis not present

## 2018-03-30 DIAGNOSIS — K222 Esophageal obstruction: Secondary | ICD-10-CM

## 2018-03-30 DIAGNOSIS — D125 Benign neoplasm of sigmoid colon: Secondary | ICD-10-CM

## 2018-03-30 DIAGNOSIS — K2970 Gastritis, unspecified, without bleeding: Secondary | ICD-10-CM | POA: Diagnosis not present

## 2018-03-30 DIAGNOSIS — K2210 Ulcer of esophagus without bleeding: Secondary | ICD-10-CM | POA: Diagnosis not present

## 2018-03-30 DIAGNOSIS — Z1211 Encounter for screening for malignant neoplasm of colon: Secondary | ICD-10-CM

## 2018-03-30 DIAGNOSIS — K2950 Unspecified chronic gastritis without bleeding: Secondary | ICD-10-CM

## 2018-03-30 MED ORDER — SODIUM CHLORIDE 0.9 % IV SOLN: 500.0000 mL | Freq: Once | INTRAVENOUS | Status: DC

## 2018-03-30 MED ORDER — PANTOPRAZOLE SODIUM 40 MG PO TBEC: 40.0000 mg | DELAYED_RELEASE_TABLET | Freq: Every day | ORAL | 3 refills | Status: DC

## 2018-03-30 NOTE — Op Note (Signed)
[facility] Patient Name: Rebecca Fleming Procedure Date: 03/30/2018 1:40 PM MRN: [id number] Endoscopist: Ladene Artist , MD Age: 52 Referring MD:  Date of Birth: [birth year] Gender: Female Account #: [id number] Procedure:                Colonoscopy Indications:              Screening for colorectal malignant neoplasm Medicines:                Monitored Anesthesia Care Procedure:                Pre-Anesthesia Assessment:                           - Prior to the procedure, a History and Physical                            was performed, and patient medications and                            allergies were reviewed. The patient's tolerance of                            previous anesthesia was also reviewed. The risks                            and benefits of the procedure and the sedation                            options and risks were discussed with the patient.                            All questions were answered, and informed consent                            was obtained. Prior Anticoagulants: The patient has                            taken no previous anticoagulant or antiplatelet                            agents. ASA Grade Assessment: II - A patient with                            mild systemic disease. After reviewing the risks                            and benefits, the patient was deemed in                            satisfactory condition to undergo the procedure.                           After obtaining informed consent, the colonoscope  was passed under direct vision. Throughout the                            procedure, the patient's blood pressure, pulse, and                            oxygen saturations were monitored continuously. The                            Colonoscope was introduced through the anus and                            advanced to the the cecum, identified by                            appendiceal orifice and  ileocecal valve. The                            ileocecal valve, appendiceal orifice, and rectum                            were photographed. The quality of the bowel                            preparation was excellent. The colonoscopy was                            performed without difficulty. The patient tolerated                            the procedure well. Scope In: 1:45:40 PM Scope Out: 2:08:17 PM Scope Withdrawal Time: 0 hours 16 minutes 33 seconds  Total Procedure Duration: 0 hours 22 minutes 37 seconds  Findings:                 The perianal and digital rectal examinations were                            normal.                           A 28 mm polyp was found in the sigmoid colon. The                            polyp was sessile. The polyp was removed with a                            piecemeal technique using a hot snare. Resection                            and retrieval were complete. Area was tattooed with                            an injection of 3 mL of Spot (carbon black).  The exam was otherwise without abnormality on                            direct and retroflexion views. Complications:            No immediate complications. Estimated blood loss:                            None. Estimated Blood Loss:     Estimated blood loss: none. Impression:               - One 28 mm polyp in the sigmoid colon, removed                            piecemeal using a hot snare. Resected and                            retrieved. Tattooed.                           - The examination was otherwise normal on direct                            and retroflexion views. Recommendation:           - Repeat colonoscopy in 6 months for surveillance                            after piecemeal polypectomy.                           - Patient has a contact number available for                            emergencies. The signs and symptoms of potential                             delayed complications were discussed with the                            patient. Return to normal activities tomorrow.                            Written discharge instructions were provided to the                            patient.                           - Resume previous diet.                           - Continue present medications.                           - Await pathology results.                           -  No aspirin, ibuprofen, naproxen, or other                            non-steroidal anti-inflammatory drugs for 2 weeks                            after polyp removal. Ladene Artist, MD 03/30/2018 2:23:04 PM This report has been signed electronically.

## 2018-03-30 NOTE — Progress Notes (Signed)
Called to room to assist during endoscopic procedure.  Patient ID and intended procedure confirmed with present staff. Received instructions for my participation in the procedure from the performing physician.  

## 2018-03-30 NOTE — Op Note (Signed)
[facility] Patient Name: Rebecca Fleming Procedure Date: 03/30/2018 1:40 PM MRN: [id number] Endoscopist: Ladene Artist , MD Age: 52 Referring MD:  Date of Birth: 1965-11-25 Gender: Female Account #: [id number] Procedure:                Upper GI endoscopy Indications:              Dysphagia Medicines:                Monitored Anesthesia Care Procedure:                Pre-Anesthesia Assessment:                           - Prior to the procedure, a History and Physical                            was performed, and patient medications and                            allergies were reviewed. The patient's tolerance of                            previous anesthesia was also reviewed. The risks                            and benefits of the procedure and the sedation                            options and risks were discussed with the patient.                            All questions were answered, and informed consent                            was obtained. Prior Anticoagulants: The patient has                            taken no previous anticoagulant or antiplatelet                            agents. ASA Grade Assessment: II - A patient with                            mild systemic disease. After reviewing the risks                            and benefits, the patient was deemed in                            satisfactory condition to undergo the procedure.                           After obtaining informed consent, the endoscope was  passed under direct vision. Throughout the                            procedure, the patient's blood pressure, pulse, and                            oxygen saturations were monitored continuously. The                            Endoscope was introduced through the mouth, and                            advanced to the second part of duodenum. The upper                            GI endoscopy was accomplished without  difficulty.                            The patient tolerated the procedure well. Scope In: Scope Out: Findings:                 LA Grade A (one or more mucosal breaks less than 5                            mm, not extending between tops of 2 mucosal folds)                            esophagitis with no bleeding was found at the                            gastroesophageal junction.                           One benign-appearing, intrinsic moderate stenosis                            was found at the gastroesophageal junction. This                            stenosis measured 1.3 cm (inner diameter). The                            stenosis was traversed. A guidewire was placed and                            the scope was withdrawn. Dilations were performed                            with Savary dilators with mild resistance at 13 mm,                            14 mm and 15 mm.  The exam of the esophagus was otherwise normal.                           Diffuse moderate inflammation characterized by                            friability and granularity was found in the gastric                            fundus and in the gastric body. Biopsies were taken                            with a cold forceps for histology.                           A medium-sized hiatal hernia was present.                           The exam of the stomach was otherwise normal.                           The duodenal bulb and second portion of the                            duodenum were normal. Complications:            No immediate complications. Estimated Blood Loss:     Estimated blood loss was minimal. Impression:               - LA Grade A reflux esophagitis.                           - Benign-appearing esophageal stenosis. Dilated.                           - Gastritis. Biopsied.                           - Medium-sized hiatal hernia.                           - Normal duodenal bulb and  second portion of the                            duodenum. Recommendation:           - Patient has a contact number available for                            emergencies. The signs and symptoms of potential                            delayed complications were discussed with the                            patient. Return to normal activities tomorrow.  Written discharge instructions were provided to the                            patient.                           - Clear liquid diet for 2 hours, then advance as                            tolerated to soft diet today.                           - Continue present medications.                           - Protonix (pantoprazole) 40 mg PO daily, 1 year of                            refills.                           - Await pathology results. Ladene Artist, MD 03/30/2018 2:29:38 PM This report has been signed electronically.

## 2018-03-30 NOTE — Patient Instructions (Addendum)
YOU HAD AN ENDOSCOPIC PROCEDURE TODAY AT [facility]:   Refer to the procedure report that was given to you for any specific questions about what was found during the examination.  If the procedure report does not answer your questions, please call your gastroenterologist to clarify.  If you requested that your care partner not be given the details of your procedure findings, then the procedure report has been included in a sealed envelope for you to review at your convenience later.  YOU SHOULD EXPECT: Some feelings of bloating in the abdomen. Passage of more gas than usual.  Walking can help get rid of the air that was put into your GI tract during the procedure and reduce the bloating. If you had a lower endoscopy (such as a colonoscopy or flexible sigmoidoscopy) you may notice spotting of blood in your stool or on the toilet paper. If you underwent a bowel prep for your procedure, you may not have a normal bowel movement for a few days.  Please Note:  You might notice some irritation and congestion in your nose or some drainage.  This is from the oxygen used during your procedure.  There is no need for concern and it should clear up in a day or so.  SYMPTOMS TO REPORT IMMEDIATELY:   Following lower endoscopy (colonoscopy or flexible sigmoidoscopy):  Excessive amounts of blood in the stool  Significant tenderness or worsening of abdominal pains  Swelling of the abdomen that is new, acute  Fever of 100F or higher   Following upper endoscopy (EGD)  Vomiting of blood or coffee ground material  New chest pain or pain under the shoulder blades  Painful or persistently difficult swallowing  New shortness of breath  Fever of 100F or higher  Black, tarry-looking stools  For urgent or emergent issues, a gastroenterologist can be reached at any hour by calling [phone].   Please see handouts given to you on Polyps, Gastritis, Esophagitis and Post Dilation diet and Hiatal  Hernia.  No Ibuprofen, NSAIDS, ibuprofen or Aleve for 2 weeks.  Repeat colonoscopy in 54months. We will notify you when the schedule is out.  You are being started on Protonix 40 mg daily.  DIET:  Nothing by mouth until 3:30 then soft diet to start at 4:30 for the rest of the day. Tomorrow you may proceed to your regular diet.  Drink plenty of fluids but you should avoid alcoholic beverages for 24 hours.  ACTIVITY:  You should plan to take it easy for the rest of today and you should NOT DRIVE or use heavy machinery until tomorrow (because of the sedation medicines used during the test).    FOLLOW UP: Our staff will call the number listed on your records the next business day following your procedure to check on you and address any questions or concerns that you may have regarding the information given to you following your procedure. If we do not reach you, we will leave a message.  However, if you are feeling well and you are not experiencing any problems, there is no need to return our call.  We will assume that you have returned to your regular daily activities without incident.  If any biopsies were taken you will be contacted by phone or by letter within the next 1-3 weeks.  Please call us at [phone] if you have not heard about the biopsies in 3 weeks.    SIGNATURES/CONFIDENTIALITY: You and/or your care partner have signed  paperwork which will be entered into your electronic medical record.  These signatures attest to the fact that that the information above on your After Visit Summary has been reviewed and is understood.  Full responsibility of the confidentiality of this discharge information lies with you and/or your care-partner.  Thank you for letting us take care of your healthcare needs today.

## 2018-03-30 NOTE — Progress Notes (Signed)
To PACU, VSS. Report to [email or web address] 

## 2018-03-31 ENCOUNTER — Telehealth: Payer: Self-pay

## 2018-03-31 NOTE — Telephone Encounter (Signed)
  Follow up Call-  Call back number 03/30/2018  Post procedure Call Back phone  # [phone]  Permission to leave phone message Yes  Some recent data might be hidden     Patient questions:  Do you have a fever, pain , or abdominal swelling? No. Pain Score  0 *  Have you tolerated food without any problems? Yes.    Have you been able to return to your normal activities? Yes.    Do you have any questions about your discharge instructions: Diet   No. Medications  No. Follow up visit  No.  Do you have questions or concerns about your Care? No.  Actions: * If pain score is 4 or above: No action needed, pain <4.

## 2018-04-07 ENCOUNTER — Other Ambulatory Visit: Payer: Self-pay

## 2018-04-07 MED ORDER — BIS SUBCIT-METRONID-TETRACYC 140-125-125 MG PO CAPS: 3.0000 | ORAL_CAPSULE | Freq: Three times a day (TID) | ORAL | 0 refills | Status: DC

## 2018-04-25 ENCOUNTER — Other Ambulatory Visit: Payer: Self-pay | Admitting: Physician Assistant

## 2018-04-25 NOTE — Telephone Encounter (Signed)
Last visit: 01/11/2018 Next visit: 06/14/2018 Labs: 01/10/2018 Mild neutropenia. We will continue to monitor. Eye exam: 09/16/2017   Okay to refill per Dr. Estanislado Pandy.

## 2018-04-29 ENCOUNTER — Telehealth: Payer: Self-pay | Admitting: Obstetrics and Gynecology

## 2018-04-29 DIAGNOSIS — R42 Dizziness and giddiness: Secondary | ICD-10-CM

## 2018-04-29 NOTE — Telephone Encounter (Signed)
I recommend a consultation with physician at [facility].

## 2018-04-29 NOTE — Telephone Encounter (Signed)
Spoke with patient. Patient reports having "vertigo" since 1st wk in July. Noticed mostly at night when lying down, room is spinning. 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   Tried Nasonex initially, no changes. Has not tried OTC meclizine/Antivert. No PCP. Denies any other symptoms.   Advised patient she may try OTC meclizine. If symptoms do not resolve or symptoms worsen, instructed to seek immediate care at local ER/Urgent care for evaluation.   Advised Dr. Quincy Simmonds will review, I will return call with any additional recommendations.   Routing to provider for final review. Patient is agreeable to disposition. Will close encounter.

## 2018-04-29 NOTE — Addendum Note (Signed)
Addended by: Burnice Logan on: 04/29/2018 05:03 PM   Modules accepted: Orders

## 2018-04-29 NOTE — Telephone Encounter (Signed)
Patient called and states she has had vertigo symptoms since July. Would like recommendations on what she should do. Please advise

## 2018-04-29 NOTE — Telephone Encounter (Signed)
Call returned to patient. Advised per Dr. Quincy Simmonds. Patient agreeable to referral. Order placed for ENT/ Dr. Janace Hoard, may schedule with first available provider. Advised our office referral coordinator will f/u with appt details once scheduled. Patient verbalizes understanding and agree. Encounter closed.   Routing to [employer] .

## 2018-05-31 NOTE — Progress Notes (Signed)
Office Visit Note  Patient: Rebecca Fleming             Date of Birth: [birth year]           MRN: [id number]             PCP: Nunzio Cobbs, MD Referring: No ref. provider found Visit Date: 06/14/2018 Occupation: @GUAROCC @  Subjective:  Medication monitoring   History of Present Illness: Rebecca Fleming is a 52 y.o. female with history of seropositive rheumatoid arthritis and osteoarthritis. She is on PLQ 200 mg po BID. She denies any recent flares.  She denies any joint pain or joint swelling at this time.  She states she experiences some lower back stiffness first thing in the morning.  She denies any left knee swelling at this time.  She states after the aspiration and cortisone injection at her last visit the left knee has been doing well.  She denies any psoriasis at this time.  She states she was diagnosed with shingles this morning but denies any current rashes.  She states she has a morton's neuroma of the right foot.    Activities of Daily Living:  Patient reports morning stiffness for 2   minutes.   Patient Denies nocturnal pain.  Difficulty dressing/grooming: Denies Difficulty climbing stairs: Reports Difficulty getting out of chair: Reports Difficulty using hands for taps, buttons, cutlery, and/or writing: Denies  Review of Systems  Constitutional: Negative for fatigue.  HENT: Negative for mouth sores, mouth dryness and nose dryness.   Eyes: Negative for pain, visual disturbance and dryness.  Respiratory: Negative for cough, hemoptysis, shortness of breath and difficulty breathing.   Cardiovascular: Negative for chest pain, palpitations, hypertension and swelling in legs/feet.  Gastrointestinal: Negative for blood in stool, constipation and diarrhea.  Endocrine: Negative for increased urination.  Genitourinary: Negative for painful urination.  Musculoskeletal: Positive for arthralgias, joint pain and morning stiffness. Negative for joint swelling, myalgias,  muscle weakness, muscle tenderness and myalgias.  Skin: Negative for color change, pallor, rash, hair loss, nodules/bumps, skin tightness, ulcers and sensitivity to sunlight.  Allergic/Immunologic: Negative for susceptible to infections.  Neurological: Negative for dizziness, numbness, headaches and weakness.  Hematological: Negative for swollen glands.  Psychiatric/Behavioral: Negative for depressed mood and sleep disturbance. The patient is not nervous/anxious.     PMFS History:  Patient Active Problem List   Diagnosis Date Noted  . Primary osteoarthritis of both knees 09/01/2017  . Scoliosis 09/01/2017  . High risk medication use 11/11/2016  . Primary osteoarthritis of both hands 11/11/2016  . Lateral epicondylitis, right elbow 11/11/2016  . Myopia with astigmatism and presbyopia, bilateral 09/09/2016  . 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 Status post laparoscopic assisted vaginal hysterectomy (LAVH) 01/15/2015  . Breast cancer, left breast ([place]) 11/07/2014  . Postmenopausal bleeding 07/13/2013  . Rheumatoid arthritis ([place]) 01/11/2012  . Psoriasis 01/11/2012    Past Medical History:  Diagnosis Date  . Abnormal uterine bleeding   . Allergy   . Anemia   . Arthritis   . Asthma    exercised induced asthma - rarely uses inhaler  . Blood transfusion without reported diagnosis 1976   1976  following surgery at [facility]   . Cancer ([place]) 08/2008   left breast--has bilateral mastectomy  . Complication of anesthesia    pt states" gets shakes" with general  . Cyst near tailbone   . Elevated LDL cholesterol level   . Environmental and seasonal allergies   .  Fibroid   . Headache    sleeps , no meds  . Heart murmur    never has caused any problems  . Hemorrhoids    hx of  . History of breast cancer   . History of shingles    03/07/18  . Hypotension    hx of  . Post-operative nausea and vomiting   . RA (rheumatoid arthritis) ([place])   . RA (rheumatoid  arthritis) ([place])   . Scoliosis    tx with robaxin prn - rarely uses  . SVD (spontaneous vaginal delivery) 1996   x 1    Family History  Problem Relation Age of Onset  . Breast cancer Mother   . Diabetes Mother   . Hypertension Mother   . Stroke Mother   . Irritable bowel syndrome Mother   . Kidney disease Mother   . Heart failure Father   . Hypertension Father   . Colon cancer Neg Hx   . Colon polyps Neg Hx   . Esophageal cancer Neg Hx   . Pancreatic cancer Neg Hx   . Rectal cancer Neg Hx   . Stomach cancer Neg Hx    Past Surgical History:  Procedure Laterality Date  . ABDOMINAL HYSTERECTOMY    . APPENDECTOMY    . BREAST LUMPECTOMY  05/17/2008   x 2 re excise for margin - Dr Margot Chimes, bilateral mastectomy 08/2008  . BREAST LUMPECTOMY W/ NEEDLE LOCALIZATION  04/23/2008   w [facility] Dr Margot Chimes  . CESAREAN SECTION  01/01/2004   Dr Quincy Simmonds  . COLONOSCOPY  03/2018  . ENDOMETRIAL BIOPSY  06/2013, 2015   x 2  . EYE SURGERY  2003   bilateral lasik  . FOOT SURGERY    . HYSTEROSCOPY W/D&C  07/21/2011   Procedure: DILATATION AND CURETTAGE (D&C) /HYSTEROSCOPY;  Surgeon: Arloa Koh;  Location: [facility];  Service: Gynecology;  Laterality: N/A;  with Ultrasound Guidance  . INSERTION OF TISSUE EXPANDER AFTER MASTECTOMY  09/11/2008   Dr Harlow Mares  . KNEE ARTHROSCOPY W/ MENISCAL REPAIR Right 2010   --Dr. Telford Nab  . LAPAROSCOPIC ASSISTED VAGINAL HYSTERECTOMY N/A 01/15/2015   Procedure: LAPAROSCOPIC ASSISTED VAGINAL HYSTERECTOMY;  Surgeon: Nunzio Cobbs, MD;  Location: [facility];  Service: Gynecology;  Laterality: N/A;  . left foot surgery  2004  . left rotator cuff repair  01/2006  . MASTECTOMY  09/11/2008   bilateral - Dr Margot Chimes  . PORT-A-CATH REMOVAL  09/09/2009   Dr Margot Chimes  . PORTACATH PLACEMENT  05/17/2008   Dr Cori Razor rounds of chemo  . REMOVAL OF TISSUE EXPANDER AND PLACEMENT OF IMPLANT  12/2008  . RHINOPLASTY    . SALPINGOOPHORECTOMY Bilateral 01/15/2015   Procedure: BILATERAL  SALPINGO OOPHORECTOMY;  Surgeon: Nunzio Cobbs, MD;  Location: [facility];  Service: Gynecology;  Laterality: Bilateral;  . UMBILICAL HERNIA REPAIR  09/09/2009   Dr Margot Chimes  . UPPER GI ENDOSCOPY  03/2018  . WISDOM TOOTH EXTRACTION     Social History   Social History Narrative  . Not on file    Objective: Vital Signs: BP 105/68 (BP Location: Right Arm, Patient Position: Sitting, Cuff Size: Normal)   Pulse 60   Resp 14   Ht 5\' 7"  (1.702 m)   Wt 162 lb 6.4 oz (73.7 kg)   LMP 11/12/2014   BMI 25.44 kg/m    Physical Exam  Constitutional: She is oriented to person, place, and time. She appears well-developed and well-nourished.  HENT:  Head:  Normocephalic and atraumatic.  Eyes: Conjunctivae and EOM are normal.  Neck: Normal range of motion.  Cardiovascular: Normal rate, regular rhythm and intact distal pulses.  Murmur heard. Pulmonary/Chest: Effort normal and breath sounds normal.  Abdominal: Soft. Bowel sounds are normal.  Lymphadenopathy:    She has no cervical adenopathy.  Neurological: She is alert and oriented to person, place, and time.  Skin: Skin is warm and dry. Capillary refill takes less than 2 seconds.  Psychiatric: She has a normal mood and affect. Her behavior is normal.  Nursing note and vitals reviewed.    Musculoskeletal Exam: C-spine, thoracic spine, and lumbar spine good ROM.  No midline spinal tenderness.  No SI joint tenderness.  Shoulder joints, elbow joints, wrist joints, MCPs, PIPs, and DIPs good ROM with no synovitis.  Minimal DIP synovial thickening. Hip joints, knee joints, ankle joints, MTPs, PIPs ,and DIPs good ROM. No warmth or effusion of knee joints.  Morton's neuroma between right 3rd and 4th.   CDAI Exam: CDAI Score: 0.4  Patient Global Assessment: 2 (mm); Provider Global Assessment: 2 (mm) Swollen: 0 ; Tender: 0  Joint Exam   Not documented   There is currently no information documented on the homunculus. Go to the Rheumatology  activity and complete the homunculus joint exam.  Investigation: No additional findings.  Imaging: No results found.  Recent Labs: Lab Results  Component Value Date   WBC 6.2 02/07/2018   HGB 12.0 02/07/2018   PLT 264 02/07/2018   NA 142 01/10/2018   K 4.4 01/10/2018   CL 107 01/10/2018   CO2 29 01/10/2018   GLUCOSE 99 01/10/2018   BUN 9 01/10/2018   CREATININE 0.81 01/10/2018   BILITOT 0.4 01/10/2018   ALKPHOS 66 03/31/2017   AST 16 01/10/2018   ALT 12 01/10/2018   PROT 6.2 01/10/2018   ALBUMIN 4.1 03/31/2017   CALCIUM 9.2 01/10/2018   GFRAA 96 01/10/2018    Speciality Comments: PLQ Eye Exam: 09/16/17 WNL @ [place]   Procedures:  Foot Inj Date/Time: 06/14/2018 1:18 PM Performed by: Bo Merino, MD Authorized by: Bo Merino, MD   Consent Given by:  Patient Site marked: the procedure site was marked   Timeout: prior to procedure the correct patient, procedure, and site was verified   Indications:  Neuroma Condition: Morton's Neuroma   Location:  R foot Prep: patient was prepped and draped in usual sterile fashion   Needle Size:  27 G Approach:  Dorsal Medications:  0.5 mL lidocaine 1 %; 10 mg triamcinolone acetonide 40 MG/ML Patient Tolerance:  Patient tolerated the procedure well with no immediate complications   Allergies: Latex; Other; and Percocet [oxycodone-acetaminophen]   Assessment / Plan:     Visit Diagnoses: Rheumatoid arthritis involving multiple sites with positive rheumatoid factor (HCC) - +RF, +CCP: She has no active synovitis on exam. She has not had any recent rheumatoid arthritis flares.  She has no joint pain or joint swelling at this time.  She is clinically doing well on Plaquenil 200 mg by mouth BID. She will continue on this current treatment regimen.  She does not need refills at this time.  X-rays of both hands and feet were obtained today to assess for radiographic progression.  She was advised to notify us if  she develops increased joint pain or joint swelling.  She will return in 5 months.  - Plan: XR Foot 2 Views Right, XR Foot 2 Views Left, XR Hand 2 View Right, XR  Hand 2 View Left  High risk medication use - PLQ 200 mg BID. CBC and CMP were drawn today to monitor for drug toxicity.  - Plan: CBC with Differential/Platelet, COMPLETE METABOLIC PANEL WITH GFR  Primary osteoarthritis of both hands: She has mild DIP synovial thickening.  She has complete fist formation bilaterally.  Joint protection and muscle strengthening were discussed.   Primary osteoarthritis of both knees: No warmth or effusion.  She has good ROM on exam.  She has no discomfort at this time.   Effusion of left knee - Resolved.  No warmth or effusion noted.  She has good ROM with no discomfort at this time.   Morton's neuroma of right foot: She requested a cortisone injection today.  She tolerated the procedure well.  Potential side effects were discussed.  She was encouraged to keep her foot clean and to not fully submerge her foot in any dirty water.  She was advised to notify us if she develops any warmth, redness, fevers, or drainage.    Psoriasis: She has no psoriasis at this time.   Lateral epicondylitis, right elbow: Resolved.   Scoliosis, unspecified scoliosis type, unspecified spinal region  Other medical conditions are listed as follows:   History of breast cancer  History of asthma  History of anemia  Heart murmur   Orders: Orders Placed This Encounter  Procedures  . Small Joint Inj  . Foot Inj  . XR Foot 2 Views Right  . XR Foot 2 Views Left  . XR Hand 2 View Right  . XR Hand 2 View Left  . CBC with Differential/Platelet  . COMPLETE METABOLIC PANEL WITH GFR   No orders of the defined types were placed in this encounter.   Face-to-face time spent with patient was 30 minutes. Greater than 50% of time was spent in counseling and coordination of care.  Follow-Up Instructions: Return in about 5  months (around 11/14/2018) for Rheumatoid arthritis, Osteoarthritis.   Ofilia Neas, PA-C   I examined and evaluated the patient with Hazel Sams PA.  Patient had no synovitis on my examination today.  Although she had some tenderness over DIPs consistent with osteoarthritis.  She also has some tenderness over MTP joints.  She had tenderness between the right third and fourth interdigital space consistent with Morton's neuroma.  Per her request Morton's neuroma was injected with cortisone which she tolerated well.  Proper fitting shoes were discussed.  X-rays were reviewed with the patient today there was no disease progression on the radiographs.  She will continue current treatment.  The plan of care was discussed as noted above.  Bo Merino, MD  Note - This record has been created using Editor, commissioning.  Chart creation errors have been sought, but may not always  have been located. Such creation errors do not reflect on  the standard of medical care.

## 2018-06-06 ENCOUNTER — Ambulatory Visit: Payer: 59 | Admitting: Gastroenterology

## 2018-06-14 ENCOUNTER — Ambulatory Visit: Payer: 59 | Admitting: Physician Assistant

## 2018-06-14 ENCOUNTER — Ambulatory Visit (INDEPENDENT_AMBULATORY_CARE_PROVIDER_SITE_OTHER): Payer: Self-pay

## 2018-06-14 ENCOUNTER — Encounter: Payer: Self-pay | Admitting: Physician Assistant

## 2018-06-14 VITALS — BP 105/68 | HR 60 | Resp 14 | Ht 67.0 in | Wt 162.4 lb

## 2018-06-14 DIAGNOSIS — M19041 Primary osteoarthritis, right hand: Secondary | ICD-10-CM

## 2018-06-14 DIAGNOSIS — G5761 Lesion of plantar nerve, right lower limb: Secondary | ICD-10-CM | POA: Diagnosis not present

## 2018-06-14 DIAGNOSIS — Z79899 Other long term (current) drug therapy: Secondary | ICD-10-CM

## 2018-06-14 DIAGNOSIS — M25462 Effusion, left knee: Secondary | ICD-10-CM

## 2018-06-14 DIAGNOSIS — Z862 Personal history of diseases of the blood and blood-forming organs and certain disorders involving the immune mechanism: Secondary | ICD-10-CM

## 2018-06-14 DIAGNOSIS — M419 Scoliosis, unspecified: Secondary | ICD-10-CM

## 2018-06-14 DIAGNOSIS — M170 Bilateral primary osteoarthritis of knee: Secondary | ICD-10-CM | POA: Diagnosis not present

## 2018-06-14 DIAGNOSIS — M0579 Rheumatoid arthritis with rheumatoid factor of multiple sites without organ or systems involvement: Secondary | ICD-10-CM | POA: Diagnosis not present

## 2018-06-14 DIAGNOSIS — M19042 Primary osteoarthritis, left hand: Secondary | ICD-10-CM

## 2018-06-14 DIAGNOSIS — M7711 Lateral epicondylitis, right elbow: Secondary | ICD-10-CM

## 2018-06-14 DIAGNOSIS — R011 Cardiac murmur, unspecified: Secondary | ICD-10-CM

## 2018-06-14 DIAGNOSIS — Z853 Personal history of malignant neoplasm of breast: Secondary | ICD-10-CM

## 2018-06-14 DIAGNOSIS — Z8709 Personal history of other diseases of the respiratory system: Secondary | ICD-10-CM

## 2018-06-14 DIAGNOSIS — L409 Psoriasis, unspecified: Secondary | ICD-10-CM

## 2018-06-14 MED ORDER — TRIAMCINOLONE ACETONIDE 40 MG/ML IJ SUSP
10.0000 mg | INTRAMUSCULAR | Status: AC | PRN
  Administered 2018-06-14: 10 mg

## 2018-06-14 MED ORDER — LIDOCAINE HCL 1 % IJ SOLN
0.5000 mL | INTRAMUSCULAR | Status: AC | PRN
  Administered 2018-06-14: .5 mL

## 2018-06-15 LAB — CBC WITH DIFFERENTIAL/PLATELET
BASOS ABS: 41 {cells}/uL (ref 0–200)
Basophils Relative: 0.6 %
EOS PCT: 2.7 %
Eosinophils Absolute: 184 cells/uL (ref 15–500)
HEMATOCRIT: 32.4 % — AB (ref 35.0–45.0)
Hemoglobin: 10.5 g/dL — ABNORMAL LOW (ref 11.7–15.5)
LYMPHS ABS: 1741 {cells}/uL (ref 850–3900)
MCH: 25.9 pg — ABNORMAL LOW (ref 27.0–33.0)
MCHC: 32.4 g/dL (ref 32.0–36.0)
MCV: 80 fL (ref 80.0–100.0)
MPV: 10.9 fL (ref 7.5–12.5)
Monocytes Relative: 8 %
NEUTROS ABS: 4291 {cells}/uL (ref 1500–7800)
Neutrophils Relative %: 63.1 %
Platelets: 278 10*3/uL (ref 140–400)
RBC: 4.05 10*6/uL (ref 3.80–5.10)
RDW: 15.1 % — ABNORMAL HIGH (ref 11.0–15.0)
Total Lymphocyte: 25.6 %
WBC mixed population: 544 cells/uL (ref 200–950)
WBC: 6.8 10*3/uL (ref 3.8–10.8)

## 2018-06-15 LAB — COMPLETE METABOLIC PANEL WITH GFR
AG Ratio: 1.4 (calc) (ref 1.0–2.5)
ALT: 13 U/L (ref 6–29)
AST: 21 U/L (ref 10–35)
Albumin: 3.9 g/dL (ref 3.6–5.1)
Alkaline phosphatase (APISO): 58 U/L (ref 33–130)
BUN: 18 mg/dL (ref 7–25)
CALCIUM: 10.2 mg/dL (ref 8.6–10.4)
CO2: 29 mmol/L (ref 20–32)
CREATININE: 0.76 mg/dL (ref 0.50–1.05)
Chloride: 104 mmol/L (ref 98–110)
GFR, EST AFRICAN AMERICAN: 104 mL/min/{1.73_m2} (ref >=60)
GFR, EST NON AFRICAN AMERICAN: 90 mL/min/{1.73_m2} (ref >=60)
GLUCOSE: 83 mg/dL (ref 65–99)
Globulin: 2.7 g/dL (calc) (ref 1.9–3.7)
Potassium: 4.6 mmol/L (ref 3.5–5.3)
Sodium: 140 mmol/L (ref 135–146)
TOTAL PROTEIN: 6.6 g/dL (ref 6.1–8.1)
Total Bilirubin: 0.4 mg/dL (ref 0.2–1.2)

## 2018-06-15 NOTE — Progress Notes (Signed)
CBC reveals findings consistent with anemia.  Please notify patient and forward results to PCP.  All other labs are WNL.

## 2018-06-16 ENCOUNTER — Telehealth: Payer: Self-pay | Admitting: *Deleted

## 2018-06-16 NOTE — Telephone Encounter (Signed)
Patient contacted the office to verify her labs. Patient advised her labs showed anemia. Patient states she has recently seen a GI and was started on Protonix. Patient states she will check with her PCP and her GI to see what they would like her to do for follow up.

## 2018-06-17 ENCOUNTER — Telehealth: Payer: Self-pay | Admitting: Obstetrics and Gynecology

## 2018-06-17 ENCOUNTER — Telehealth: Payer: Self-pay | Admitting: Gastroenterology

## 2018-06-17 NOTE — Telephone Encounter (Signed)
Spoke with patient. Advised as seen below per Dr. Quincy Simmonds. Patient states she is scheduled with GI/Dr. Fuller Plan on 10/15 for f/u, will discuss further at that time.   Routing to provider for final review. Patient is agreeable to disposition. Will close encounter.

## 2018-06-17 NOTE — Telephone Encounter (Signed)
The pt states she was advised to make an appt to discuss her Hgb that was done at Dr Newman Nip office.  She was scheduled for 10/15.  She will keep that appt as planned. She will call if she begins to see bleeding, has SOB, chest pain or fatigue.  Her Hgb was 10.5 3 days ago.

## 2018-06-17 NOTE — Telephone Encounter (Signed)
Please let patient know that I received a cop of her labs showing her anemia.  This is a change from her other recent labs.  It is important to understand what is the type of anemia and the cause. I recommend she follow up with her GI as she is in treatment with them.  She does not have a uterus, so the anemia is not coming from menstrual loss.   If I can do anything further to help her, please let me know.

## 2018-06-17 NOTE — Telephone Encounter (Signed)
Pt needs to speak with you regarding some lab results that she just had with other doctor and was advised to inform Dr. Fuller Plan.

## 2018-07-05 ENCOUNTER — Encounter: Payer: Self-pay | Admitting: Gastroenterology

## 2018-07-05 ENCOUNTER — Other Ambulatory Visit (INDEPENDENT_AMBULATORY_CARE_PROVIDER_SITE_OTHER): Payer: 59

## 2018-07-05 ENCOUNTER — Ambulatory Visit: Payer: 59 | Admitting: Gastroenterology

## 2018-07-05 VITALS — BP 94/60 | HR 56 | Ht 65.25 in | Wt 161.0 lb

## 2018-07-05 DIAGNOSIS — Z86010 Personal history of colonic polyps: Secondary | ICD-10-CM | POA: Diagnosis not present

## 2018-07-05 DIAGNOSIS — K2100 Gastro-esophageal reflux disease with esophagitis, without bleeding: Secondary | ICD-10-CM

## 2018-07-05 DIAGNOSIS — D649 Anemia, unspecified: Secondary | ICD-10-CM | POA: Diagnosis not present

## 2018-07-05 LAB — FERRITIN: FERRITIN: 8.9 ng/mL — AB (ref 10.0–291.0)

## 2018-07-05 LAB — CBC WITH DIFFERENTIAL/PLATELET
BASOS ABS: 0 10*3/uL (ref 0.0–0.1)
Basophils Relative: 0.7 % (ref 0.0–3.0)
EOS ABS: 0.3 10*3/uL (ref 0.0–0.7)
Eosinophils Relative: 4.7 % (ref 0.0–5.0)
HEMATOCRIT: 34.8 % — AB (ref 36.0–46.0)
Hemoglobin: 11.1 g/dL — ABNORMAL LOW (ref 12.0–15.0)
LYMPHS PCT: 29.7 % (ref 12.0–46.0)
Lymphs Abs: 1.6 10*3/uL (ref 0.7–4.0)
MCHC: 31.9 g/dL (ref 30.0–36.0)
MCV: 78.8 fl (ref 78.0–100.0)
Monocytes Absolute: 0.5 10*3/uL (ref 0.1–1.0)
Monocytes Relative: 8.9 % (ref 3.0–12.0)
NEUTROS PCT: 56 % (ref 43.0–77.0)
Neutro Abs: 3.1 10*3/uL (ref 1.4–7.7)
PLATELETS: 271 10*3/uL (ref 150.0–400.0)
RBC: 4.41 Mil/uL (ref 3.87–5.11)
RDW: 17.3 % — ABNORMAL HIGH (ref 11.5–15.5)
WBC: 5.5 10*3/uL (ref 4.0–10.5)

## 2018-07-05 LAB — IBC PANEL
Iron: 13 ug/dL — ABNORMAL LOW (ref 42–145)
SATURATION RATIOS: 2.5 % — AB (ref 20.0–50.0)
Transferrin: 378 mg/dL — ABNORMAL HIGH (ref 212.0–360.0)

## 2018-07-05 LAB — IRON: IRON: 13 ug/dL — AB (ref 42–145)

## 2018-07-05 LAB — FOLATE: Folate: >23.9 ng/mL (ref >5.9)

## 2018-07-05 LAB — VITAMIN B12: VITAMIN B 12: 326 pg/mL (ref 211–911)

## 2018-07-05 NOTE — Progress Notes (Signed)
    History of Present Illness: This is a 52 year old female advised to return for GI follow-up for further evaluation of anemia: Hb=10.5, MCV=80.  She underwent colonoscopy and EGD in July 2019 showing esophagitis, an esophageal stricture, hiatal hernia and H. pylori gastritis which was treated.  No dysphagia since dilation.  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  She has no gastrointestinal symptoms or complaints.  Current Medications, Allergies, Past Medical History, Past Surgical History, Family History and Social History were reviewed in [employer] record.  Physical Exam: General: Well developed, well nourished, no acute distress Head: Normocephalic and atraumatic Eyes:  sclerae anicteric, EOMI Ears: Normal auditory acuity Mouth: No deformity or lesions Lungs: Clear throughout to auscultation Heart: Regular rate and rhythm; no murmurs, rubs or bruits Abdomen: Soft, non tender and non distended. No masses, hepatosplenomegaly or hernias noted. Normal Bowel sounds Rectal: Not done, recent colonoscopy Musculoskeletal: Symmetrical with no gross deformities  Pulses:  Normal pulses noted Extremities: No clubbing, cyanosis, edema or deformities noted Neurological: Alert oriented x 4, grossly nonfocal Psychological:  Alert and cooperative. Normal mood and affect   Assessment and Recommendations:  1.  New anemia with borderline microcytic indices.  No source of chronic blood loss was noted on colonoscopy or EGD performed in July 2019.  Repeat CBC.  Obtain iron, TIBC, ferritin, B12, folate today.  Pending findings may need further evaluation by hematology.  If anemia proves to be iron deficiency consider VCE to further evaluate.  2.  GERD with esophagitis and esophageal stricture status post dilation.  Continue pantoprazole 40 mg daily long-term and standard antireflux measures.  3.  H.  pylori gastritis.  Completed treatment for eradication.  Discussed proceeding with H. pylori antigen stool test off PPI.  Will defer for now.

## 2018-07-05 NOTE — Patient Instructions (Signed)
Your provider has requested that you go to the basement level for lab work before leaving today. Press "B" on the elevator. The lab is located at the first door on the left as you exit the elevator.  Normal BMI (Body Mass Index- based on height and weight) is between 19 and 25. Your BMI today is Body mass index is 26.59 kg/m. Marland Kitchen Please consider follow up  regarding your BMI with your Primary Care Provider.  Thank you for choosing me and [facility].  Pricilla Riffle. Dagoberto Ligas., MD., Marval Regal

## 2018-07-15 ENCOUNTER — Encounter: Payer: Self-pay | Admitting: Gastroenterology

## 2018-07-15 ENCOUNTER — Ambulatory Visit (INDEPENDENT_AMBULATORY_CARE_PROVIDER_SITE_OTHER): Payer: Self-pay | Admitting: Gastroenterology

## 2018-07-15 DIAGNOSIS — D509 Iron deficiency anemia, unspecified: Secondary | ICD-10-CM

## 2018-07-15 NOTE — Progress Notes (Signed)
Pt here for capsule endoscopy. She completed prep last evening. Pt was able to swallow the capsule without difficulty.   Lot#-[id number] Exp:  09/01/19

## 2018-07-19 ENCOUNTER — Other Ambulatory Visit: Payer: Self-pay | Admitting: Rheumatology

## 2018-07-19 NOTE — Telephone Encounter (Signed)
Last visit: 06/14/18 Next visit: February 2020. Message sent to the front to schedule patient.  Labs: 06/14/18 CBC reveals findings consistent with anemia. All other labs are WNL. PLQ Eye Exam: 09/16/17 WNL  Okay to refill per Dr. Estanislado Pandy

## 2018-07-19 NOTE — Telephone Encounter (Signed)
Please schedule patient a follow up visit. Patient due February 2020. Thanks!

## 2018-07-26 ENCOUNTER — Other Ambulatory Visit: Payer: Self-pay | Admitting: Rheumatology

## 2018-07-26 NOTE — Telephone Encounter (Signed)
Last visit: 06/14/18 Next visit: 11/15/18  Okay to refill per Dr. Estanislado Pandy

## 2018-07-27 ENCOUNTER — Telehealth: Payer: Self-pay

## 2018-07-27 ENCOUNTER — Other Ambulatory Visit (INDEPENDENT_AMBULATORY_CARE_PROVIDER_SITE_OTHER): Payer: 59

## 2018-07-27 DIAGNOSIS — D509 Iron deficiency anemia, unspecified: Secondary | ICD-10-CM

## 2018-07-27 LAB — IGA: IgA: 389 mg/dL — ABNORMAL HIGH (ref 68–378)

## 2018-07-27 NOTE — Telephone Encounter (Signed)
Patient notified of the results and recommendations.  She will come for labs today or tomorrow.

## 2018-07-27 NOTE — Telephone Encounter (Signed)
-----   Message from Ladene Artist, MD sent at 07/26/2018  5:28 PM EST ----- Please notify the patient that her capsule endoscopy study was unremarkable.  Please have her come in for tTG, IgA to complete the GI evaluation of her iron deficiency.

## 2018-07-28 LAB — TISSUE TRANSGLUTAMINASE, IGA: (tTG) Ab, IgA: 1 U/mL

## 2018-08-01 ENCOUNTER — Telehealth: Payer: Self-pay | Admitting: Rheumatology

## 2018-08-01 NOTE — Telephone Encounter (Signed)
Patient states she has had a capsule endoscopy and the test was unremarkable. Patient states that the anemia is not related to GI. Patient was started on iron. Patient states she fell in July, where the stool fell on her left ankle. Patient states she still has a bruise and knot right which is the size of an "extra large egg". Patient states the area is tender to the touch. Patient states she did not show this to you when she was in the office on 06/14/18. Patient states some days are worse than others depending upon her activity level. We treat patient for RA an OA of hands and knees Patient would like to know whom she should see about this. Please advise.

## 2018-08-01 NOTE — Telephone Encounter (Signed)
Patient calling to let you know she has a lump on the back of her shin that is left over from a bruise from July. Lump still tender, and bruised. Patient also states her Anemia does not have anything to do with Gastro. Patient had scopes done. Please call patient to discuss.

## 2018-08-02 ENCOUNTER — Ambulatory Visit: Payer: 59 | Admitting: Rheumatology

## 2018-08-02 ENCOUNTER — Encounter: Payer: Self-pay | Admitting: Physician Assistant

## 2018-08-02 VITALS — BP 105/64 | HR 69 | Resp 12 | Ht 66.0 in | Wt 164.0 lb

## 2018-08-02 DIAGNOSIS — R011 Cardiac murmur, unspecified: Secondary | ICD-10-CM

## 2018-08-02 DIAGNOSIS — M19042 Primary osteoarthritis, left hand: Secondary | ICD-10-CM

## 2018-08-02 DIAGNOSIS — M19041 Primary osteoarthritis, right hand: Secondary | ICD-10-CM

## 2018-08-02 DIAGNOSIS — T148XXA Other injury of unspecified body region, initial encounter: Secondary | ICD-10-CM | POA: Diagnosis not present

## 2018-08-02 DIAGNOSIS — M0579 Rheumatoid arthritis with rheumatoid factor of multiple sites without organ or systems involvement: Secondary | ICD-10-CM | POA: Diagnosis not present

## 2018-08-02 DIAGNOSIS — Z8709 Personal history of other diseases of the respiratory system: Secondary | ICD-10-CM

## 2018-08-02 DIAGNOSIS — M170 Bilateral primary osteoarthritis of knee: Secondary | ICD-10-CM

## 2018-08-02 DIAGNOSIS — Z862 Personal history of diseases of the blood and blood-forming organs and certain disorders involving the immune mechanism: Secondary | ICD-10-CM

## 2018-08-02 DIAGNOSIS — L409 Psoriasis, unspecified: Secondary | ICD-10-CM

## 2018-08-02 DIAGNOSIS — Z79899 Other long term (current) drug therapy: Secondary | ICD-10-CM

## 2018-08-02 DIAGNOSIS — Z853 Personal history of malignant neoplasm of breast: Secondary | ICD-10-CM

## 2018-08-02 DIAGNOSIS — M419 Scoliosis, unspecified: Secondary | ICD-10-CM

## 2018-08-02 NOTE — Telephone Encounter (Signed)
Please schedule patient for an appointment to obtain a x-ray if she continues to have swelling, bruising, and pain.

## 2018-08-02 NOTE — Patient Instructions (Addendum)
Standing Labs We placed an order today for your standing lab work.    Please come back and get your standing labs in December and then every 5 months.  We have open lab Monday through Friday from 8:30-11:30 AM and 1:30-4:00 PM  at the office of Dr. Bo Merino.   You may experience shorter wait times on Monday and Friday afternoons. The office is located at [address], [place], [place], [place] [address] No appointment is necessary.   Labs are drawn by [facility].  You may receive a bill from [facility] for your lab work. If you have any questions regarding directions or hours of operation,  please call [phone].   Just as a reminder please drink plenty of water prior to coming for your lab work. Thanks!  Vaccines You are taking medications that can suppress your immune system.  The following immunizations are recommended: . Flu annually . Pneumonia (Pneumovax 54 and Prevnar 13 after age 8) . Shingrix  Please check with your PCP to make sure you are up to date.

## 2018-08-02 NOTE — Progress Notes (Signed)
Office Visit Note  Patient: Rebecca Fleming             Date of Birth: 15-May-1966           MRN: [id number]             PCP: Nunzio Cobbs, MD Referring: Aundria Rud* Visit Date: 08/02/2018 Occupation: @GUAROCC @  Subjective:  Left leg pain.  History of Present Illness: Rebecca Fleming is a 52 y.o. female with history of seropositive rheumatoid arthritis and osteoarthritis.  According to patient in July she was stepping on a stool and fell.  No stool scraped the medial aspect of her left leg.  She developed a large bruise which gradually has been getting better.  She states there is an area of redness and tenderness on the left lower aspect of her leg.  She denies any joint swelling.  Her rheumatoid arthritis has been well controlled.  She was evaluated by a gastroenterologist for anemia.  She states she had capsule enteroscopy and colonoscopy.  She also had work-up for celiac disease.  All the work-up was negative.  She has been taking iron.  She denies pain in any of her joints.  She states she still have some lower back discomfort.  Activities of Daily Living:  Patient reports morning stiffness for 0 minutes.   Patient Reports nocturnal pain.  Difficulty dressing/grooming: Denies Difficulty climbing stairs: Denies Difficulty getting out of chair: Denies Difficulty using hands for taps, buttons, cutlery, and/or writing: Reports  Review of Systems  Constitutional: Positive for fatigue. Negative for night sweats, weight gain and weight loss.  HENT: Positive for mouth sores. Negative for trouble swallowing, trouble swallowing, mouth dryness and nose dryness.   Eyes: Negative for pain, redness, itching, visual disturbance and dryness.  Respiratory: Negative for cough, shortness of breath, wheezing and difficulty breathing.   Cardiovascular: Negative for chest pain, palpitations, hypertension, irregular heartbeat and swelling in legs/feet.  Gastrointestinal: Negative  for abdominal pain, blood in stool, constipation, diarrhea, nausea and vomiting.  Endocrine: Negative for increased urination.  Genitourinary: Negative for painful urination, nocturia, pelvic pain and vaginal dryness.  Musculoskeletal: Negative for arthralgias, joint pain, joint swelling, myalgias, muscle weakness, morning stiffness, muscle tenderness and myalgias.  Skin: Negative for color change, rash, hair loss, skin tightness, ulcers and sensitivity to sunlight.  Allergic/Immunologic: Negative for susceptible to infections.  Neurological: Positive for light-headedness. Negative for dizziness, headaches, memory loss, night sweats and weakness.  Hematological: Positive for bruising/bleeding tendency. Negative for swollen glands.  Psychiatric/Behavioral: Negative for depressed mood, confusion and sleep disturbance. The patient is not nervous/anxious.     PMFS History:  Patient Active Problem List   Diagnosis Date Noted  . Primary osteoarthritis of both knees 09/01/2017  . Scoliosis 09/01/2017  . High risk medication use 11/11/2016  . Primary osteoarthritis of both hands 11/11/2016  . Lateral epicondylitis, right elbow 11/11/2016  . Myopia with astigmatism and presbyopia, bilateral 09/09/2016  . 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 Status post laparoscopic assisted vaginal hysterectomy (LAVH) 01/15/2015  . Breast cancer, left breast ([place]) 11/07/2014  . Postmenopausal bleeding 07/13/2013  . Rheumatoid arthritis ([place]) 01/11/2012  . Psoriasis 01/11/2012    Past Medical History:  Diagnosis Date  . Abnormal uterine bleeding   . Allergy   . Anemia   . Arthritis   . Asthma    exercised induced asthma - rarely uses inhaler  . Blood transfusion without reported diagnosis  [place]  following surgery at [facility]   . Cancer ([place]) 08/2008   left breast--has bilateral mastectomy  . Complication of anesthesia    pt states" gets shakes" with general  . Cyst near tailbone    . Elevated LDL cholesterol level   . Environmental and seasonal allergies   . Fibroid   . Headache    sleeps , no meds  . Heart murmur    never has caused any problems  . Helicobacter pylori infection   . Hemorrhoids    hx of  . History of breast cancer   . History of shingles    03/07/18  . Hypotension    hx of  . Post-operative nausea and vomiting   . RA (rheumatoid arthritis) ([place])   . RA (rheumatoid arthritis) ([place])   . Scoliosis    tx with robaxin prn - rarely uses  . SVD (spontaneous vaginal delivery) 1996   x 1    Family History  Problem Relation Age of Onset  . Breast cancer Mother   . Diabetes Mother   . Hypertension Mother   . Stroke Mother   . Irritable bowel syndrome Mother   . Kidney disease Mother   . Heart failure Father   . Hypertension Father   . Colon cancer Neg Hx   . Colon polyps Neg Hx   . Esophageal cancer Neg Hx   . Pancreatic cancer Neg Hx   . Rectal cancer Neg Hx   . Stomach cancer Neg Hx    Past Surgical History:  Procedure Laterality Date  . ABDOMINAL HYSTERECTOMY    . APPENDECTOMY    . BREAST LUMPECTOMY  05/17/2008   x 2 re excise for margin - Dr Margot Chimes, bilateral mastectomy 08/2008  . BREAST LUMPECTOMY W/ NEEDLE LOCALIZATION  04/23/2008   w SLN Dr Margot Chimes  . CESAREAN SECTION  01/01/2004   Dr Quincy Simmonds  . COLONOSCOPY  03/2018  . ENDOMETRIAL BIOPSY  06/2013, 2015   x 2  . EYE SURGERY  2003   bilateral lasik  . FOOT SURGERY    . HYSTEROSCOPY W/D&C  07/21/2011   Procedure: DILATATION AND CURETTAGE (D&C) /HYSTEROSCOPY;  Surgeon: Arloa Koh;  Location: [facility];  Service: Gynecology;  Laterality: N/A;  with Ultrasound Guidance  . INSERTION OF TISSUE EXPANDER AFTER MASTECTOMY  09/11/2008   Dr Harlow Mares  . KNEE ARTHROSCOPY W/ MENISCAL REPAIR Right 2010   --Dr. Telford Nab  . LAPAROSCOPIC ASSISTED VAGINAL HYSTERECTOMY N/A 01/15/2015   Procedure: LAPAROSCOPIC ASSISTED VAGINAL HYSTERECTOMY;  Surgeon: Nunzio Cobbs, MD;  Location: [facility];   Service: Gynecology;  Laterality: N/A;  . left foot surgery  2004  . left rotator cuff repair  01/2006  . MASTECTOMY  09/11/2008   bilateral - Dr Margot Chimes  . PORT-A-CATH REMOVAL  09/09/2009   Dr Margot Chimes  . PORTACATH PLACEMENT  05/17/2008   Dr Cori Razor rounds of chemo  . REMOVAL OF TISSUE EXPANDER AND PLACEMENT OF IMPLANT  12/2008  . RHINOPLASTY    . SALPINGOOPHORECTOMY Bilateral 01/15/2015   Procedure: BILATERAL SALPINGO OOPHORECTOMY;  Surgeon: Nunzio Cobbs, MD;  Location: [facility];  Service: Gynecology;  Laterality: Bilateral;  . UMBILICAL HERNIA REPAIR  09/09/2009   Dr Margot Chimes  . UPPER GI ENDOSCOPY  03/2018  . WISDOM TOOTH EXTRACTION     Social History   Social History Narrative  . Not on file    Objective: Vital Signs: BP 105/64 (BP Location: Right Arm,  Patient Position: Sitting, Cuff Size: Normal)   Pulse 69   Resp 12   Ht 5\' 6"  (1.676 m)   Wt 164 lb (74.4 kg)   LMP 11/12/2014   BMI 26.47 kg/m    Physical Exam  Constitutional: She is oriented to person, place, and time. She appears well-developed and well-nourished.  HENT:  Head: Normocephalic and atraumatic.  Eyes: Conjunctivae and EOM are normal.  Neck: Normal range of motion.  Cardiovascular: Normal rate, regular rhythm, normal heart sounds and intact distal pulses.  Pulmonary/Chest: Effort normal and breath sounds normal.  Abdominal: Soft. Bowel sounds are normal.  Lymphadenopathy:    She has no cervical adenopathy.  Neurological: She is alert and oriented to person, place, and time.  Skin: Skin is warm and dry. Capillary refill takes less than 2 seconds.  6 x 5.5 cm hematoma was noted over medial aspect of her distal left lower extremity  Psychiatric: She has a normal mood and affect. Her behavior is normal.  Nursing note and vitals reviewed.    Musculoskeletal Exam: C-spine thoracic lumbar spine good range of motion.  She does have thoracic and lumbar severe scoliosis.  Shoulder joints elbow joints  wrist joint MCPs PIPs DIPs were in good range of motion with no synovitis.  Hip joints knee joints ankles MTPs PIPs were in good range of motion with no synovitis.  CDAI Exam: CDAI Score: 0.2  Patient Global Assessment: 1 (mm); Provider Global Assessment: 1 (mm) Swollen: 0 ; Tender: 0  Joint Exam   Not documented   There is currently no information documented on the homunculus. Go to the Rheumatology activity and complete the homunculus joint exam.  Investigation: No additional findings.  Imaging: No results found.  Recent Labs: Lab Results  Component Value Date   WBC 5.5 07/05/2018   HGB 11.1 (L) 07/05/2018   PLT 271.0 07/05/2018   NA 140 06/14/2018   K 4.6 06/14/2018   CL 104 06/14/2018   CO2 29 06/14/2018   GLUCOSE 83 06/14/2018   BUN 18 06/14/2018   CREATININE 0.76 06/14/2018   BILITOT 0.4 06/14/2018   ALKPHOS 66 03/31/2017   AST 21 06/14/2018   ALT 13 06/14/2018   PROT 6.6 06/14/2018   ALBUMIN 4.1 03/31/2017   CALCIUM 10.2 06/14/2018   GFRAA 104 06/14/2018    Speciality Comments: PLQ Eye Exam: 09/16/17 WNL @ [facility]   Procedures:  No procedures performed Allergies: Latex; Other; and Percocet [oxycodone-acetaminophen]   Assessment / Plan:     Visit Diagnoses: Hematoma - Left lower extremity which is 6 x 5.5 cm.  We will observe it for right now.  Rheumatoid arthritis involving multiple sites with positive rheumatoid factor (HCC) - +RF, +CCP.  Patient had no synovitis on examination.  She is doing well on Plaquenil.  High risk medication use - PLQ 200 mg p.o. twice daily.  Her labs have been stable.  Eye exam: 09/16/2017  Primary osteoarthritis of both hands-she is currently not having much discomfort.  Primary osteoarthritis of both knees-she denies any knee joint discomfort.  Psoriasis-she has no active lesions of psoriasis.  Scoliosis, unspecified scoliosis type, unspecified spinal region-she has severe scoliosis which causes chronic  lower back discomfort  History of asthma  History of breast cancer  History of anemia   Vaccines You are taking medications that can suppress your immune system.  The following immunizations are recommended: . Flu annually . Pneumonia (Pneumovax 50 and Prevnar 13 after age 84) . Shingrix  Please check with your PCP to make sure you are up to date. Orders: No orders of the defined types were placed in this encounter.  No orders of the defined types were placed in this encounter.   Face-to-face time spent with patient was 30 minutes. Greater than 50% of time was spent in counseling and coordination of care.  Follow-Up Instructions: Return in about 5 months (around 01/01/2019) for Rheumatoid arthritis, Osteoarthritis.   Bo Merino, MD  Note - This record has been created using Editor, commissioning.  Chart creation errors have been sought, but may not always  have been located. Such creation errors do not reflect on  the standard of medical care.

## 2018-08-02 NOTE — Telephone Encounter (Signed)
Patient has been scheduled for 08/02/18 at 1:40 pm.

## 2018-09-26 ENCOUNTER — Telehealth: Payer: Self-pay | Admitting: Obstetrics and Gynecology

## 2018-09-26 ENCOUNTER — Ambulatory Visit: Payer: 59 | Admitting: Obstetrics and Gynecology

## 2018-09-26 NOTE — Telephone Encounter (Signed)
Patient's husband called and states patient is still at her eye appointment in [facility] and cannot make appointment with our office today at 4:00pm for UTI symptoms. Husband requested to reschedule patient for tomorrow. Appointment rescheduled for tomorrow at 11:30am with Dr. Quincy Simmonds. Advised husband that if patient's symptoms worsen, she will need to proceed to Urgent Care or ER.   Routing to provider and will close encounter.

## 2018-09-27 ENCOUNTER — Encounter: Payer: Self-pay | Admitting: Obstetrics and Gynecology

## 2018-09-27 ENCOUNTER — Ambulatory Visit: Payer: 59 | Admitting: Obstetrics and Gynecology

## 2018-09-27 VITALS — BP 106/60 | HR 76 | Temp 97.7°F | Resp 16 | Ht 67.0 in | Wt 161.0 lb

## 2018-09-27 DIAGNOSIS — R350 Frequency of micturition: Secondary | ICD-10-CM

## 2018-09-27 LAB — POCT URINALYSIS DIPSTICK
BILIRUBIN UA: NEGATIVE
Glucose, UA: NEGATIVE
KETONES UA: POSITIVE
Nitrite, UA: POSITIVE
PH UA: 5 (ref 5.0–8.0)
Protein, UA: POSITIVE — AB
RBC UA: NEGATIVE
UROBILINOGEN UA: 0.2 U/dL

## 2018-09-27 MED ORDER — SULFAMETHOXAZOLE-TRIMETHOPRIM 800-160 MG PO TABS: 1.0000 | ORAL_TABLET | Freq: Two times a day (BID) | ORAL | 0 refills | Status: DC

## 2018-09-27 MED ORDER — FLUCONAZOLE 150 MG PO TABS: 150.0000 mg | ORAL_TABLET | Freq: Once | ORAL | 0 refills | Status: AC

## 2018-09-27 NOTE — Progress Notes (Signed)
GYNECOLOGY  VISIT   HPI: 53 y.o.   Married  Caucasian  female   G2P2 with Patient's last menstrual period was 11/12/2014.   here for UTI symptoms of frequency, urgency, pain with urination.  Symptoms followed intercourse.   Noticing urinary odor.  Some back pain but does have scoliosis.  More on left side.  No fever.  No nausea or vomiting.    Also has a boil on the vulva.  Urine: +2 WBC, +Nitrites, +Protein, +Ketones  GYNECOLOGIC HISTORY: Patient's last menstrual period was 11/12/2014. Contraception:  Hysterectomy Menopausal hormone therapy:  none Last mammogram:  2009 bilateral mastectomy Last pap smear:   08/01/14 Neg:Neg HR HPV        OB History    Gravida  2   Para  2   Term      Preterm      AB      Living  3     SAB      TAB      Ectopic      Multiple  1   Live Births  3        Obstetric Comments  Pt was a surrogate for the twins           Patient Active Problem List   Diagnosis Date Noted  . Primary osteoarthritis of both knees 09/01/2017  . Scoliosis 09/01/2017  . High risk medication use 11/11/2016  . Primary osteoarthritis of both hands 11/11/2016  . Lateral epicondylitis, right elbow 11/11/2016  . Myopia with astigmatism and presbyopia, bilateral 09/09/2016  . 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 Status post laparoscopic assisted vaginal hysterectomy (LAVH) 01/15/2015  . Breast cancer, left breast ([place]) 11/07/2014  . Postmenopausal bleeding 07/13/2013  . Rheumatoid arthritis ([place]) 01/11/2012  . Psoriasis 01/11/2012    Past Medical History:  Diagnosis Date  . Abnormal uterine bleeding   . Allergy   . Anemia   . Arthritis   . Asthma    exercised induced asthma - rarely uses inhaler  . Blood transfusion without reported diagnosis 1976   1976  following surgery at [facility]   . Cancer ([place]) 08/2008   left breast--has bilateral mastectomy  . Complication of anesthesia    pt states" gets shakes" with  general  . Cyst near tailbone   . Elevated LDL cholesterol level   . Environmental and seasonal allergies   . Fibroid   . Headache    sleeps , no meds  . Heart murmur    never has caused any problems  . Helicobacter pylori infection   . Hemorrhoids    hx of  . History of breast cancer   . History of shingles    03/07/18  . Hypotension    hx of  . Post-operative nausea and vomiting   . RA (rheumatoid arthritis) ([place])   . RA (rheumatoid arthritis) ([place])   . Scoliosis    tx with robaxin prn - rarely uses  . SVD (spontaneous vaginal delivery) 1996   x 1    Past Surgical History:  Procedure Laterality Date  . ABDOMINAL HYSTERECTOMY    . APPENDECTOMY    . BREAST LUMPECTOMY  05/17/2008   x 2 re excise for margin - Dr Margot Chimes, bilateral mastectomy 08/2008  . BREAST LUMPECTOMY W/ NEEDLE LOCALIZATION  04/23/2008   w SLN Dr Margot Chimes  . CESAREAN SECTION  01/01/2004   Dr Quincy Simmonds  . COLONOSCOPY  03/2018  . ENDOMETRIAL BIOPSY  06/2013, 2015   x 2  . EYE SURGERY  2003   bilateral lasik  . FOOT SURGERY    . HYSTEROSCOPY W/D&C  07/21/2011   Procedure: DILATATION AND CURETTAGE (D&C) /HYSTEROSCOPY;  Surgeon: Arloa Koh;  Location: [facility];  Service: Gynecology;  Laterality: N/A;  with Ultrasound Guidance  . INSERTION OF TISSUE EXPANDER AFTER MASTECTOMY  09/11/2008   Dr Harlow Mares  . KNEE ARTHROSCOPY W/ MENISCAL REPAIR Right 2010   --Dr. Telford Nab  . LAPAROSCOPIC ASSISTED VAGINAL HYSTERECTOMY N/A 01/15/2015   Procedure: LAPAROSCOPIC ASSISTED VAGINAL HYSTERECTOMY;  Surgeon: Nunzio Cobbs, MD;  Location: [facility];  Service: Gynecology;  Laterality: N/A;  . left foot surgery  2004  . left rotator cuff repair  01/2006  . MASTECTOMY  09/11/2008   bilateral - Dr Margot Chimes  . PORT-A-CATH REMOVAL  09/09/2009   Dr Margot Chimes  . PORTACATH PLACEMENT  05/17/2008   Dr Cori Razor rounds of chemo  . REMOVAL OF TISSUE EXPANDER AND PLACEMENT OF IMPLANT  12/2008  . RHINOPLASTY    . SALPINGOOPHORECTOMY  Bilateral 01/15/2015   Procedure: BILATERAL SALPINGO OOPHORECTOMY;  Surgeon: Nunzio Cobbs, MD;  Location: [facility];  Service: Gynecology;  Laterality: Bilateral;  . UMBILICAL HERNIA REPAIR  09/09/2009   Dr Margot Chimes  . UPPER GI ENDOSCOPY  03/2018  . WISDOM TOOTH EXTRACTION      Current Outpatient Medications  Medication Sig Dispense Refill  . calcium carbonate (OS-CAL) 600 MG TABS tablet Take 600 mg by mouth 2 (two) times daily with a meal.    . cetirizine (ZYRTEC) 10 MG tablet Take 10 mg by mouth daily.      . cyclobenzaprine (FLEXERIL) 5 MG tablet Take 5 mg by mouth as needed for muscle spasms.    . Ferrous Sulfate (IRON) 325 (65 Fe) MG TABS Take by mouth 2 (two) times daily.    . folic acid (FOLVITE) 1 MG tablet TAKE 2 TABLETS BY MOUTH EVERY DAY 180 tablet 2  . GLUCOSAMINE PO Take 2 tablets by mouth daily.     . hydroxychloroquine (PLAQUENIL) 200 MG tablet TAKE 1 TABLET BY MOUTH TWICE A DAY 180 tablet 0  . ibuprofen (ADVIL) 200 MG tablet Take 200 mg by mouth as needed.    . Magnesium 500 MG TABS Take 1 tablet by mouth 2 (two) times daily.    . Manganese 50 MG TABS Take 1 tablet by mouth daily.    . mometasone (NASONEX) 50 MCG/ACT nasal spray Place 2 sprays into the nose as needed.     . pantoprazole (PROTONIX) 40 MG tablet Take 1 tablet (40 mg total) by mouth daily. 90 tablet 3  . Plant Sterols and Stanols (CHOLESTOFF PO) Take by mouth daily.     . fluconazole (DIFLUCAN) 150 MG tablet Take 1 tablet (150 mg total) by mouth once for 1 dose. Take one tablet.  Repeat in 72 hours if symptoms are not completely resolved. 2 tablet 0  . sulfamethoxazole-trimethoprim (BACTRIM DS) 800-160 MG tablet Take 1 tablet by mouth 2 (two) times daily. One PO BID x 3 days 6 tablet 0   Current Facility-Administered Medications  Medication Dose Route Frequency Provider Last Rate Last Dose  . 0.9 %  sodium chloride infusion  500 mL Intravenous Once Ladene Artist, MD         ALLERGIES: Latex;  Other; and Percocet [oxycodone-acetaminophen]  Family History  Problem Relation Age of Onset  . Breast cancer Mother   .  Diabetes Mother   . Hypertension Mother   . Stroke Mother   . Irritable bowel syndrome Mother   . Kidney disease Mother   . Heart failure Father   . Hypertension Father   . Colon cancer Neg Hx   . Colon polyps Neg Hx   . Esophageal cancer Neg Hx   . Pancreatic cancer Neg Hx   . Rectal cancer Neg Hx   . Stomach cancer Neg Hx     Social History   Socioeconomic History  . Marital status: Married    Spouse name: Not on file  . Number of children: 1  . Years of education: Not on file  . Highest education level: Not on file  Occupational History  . Not on file  Social Needs  . Financial resource strain: Not on file  . Food insecurity:    Worry: Not on file    Inability: Not on file  . Transportation needs:    Medical: Not on file    Non-medical: Not on file  Tobacco Use  . Smoking status: Never Smoker  . Smokeless tobacco: Never Used  Substance and Sexual Activity  . Alcohol use: Yes    Alcohol/week: 1.0 standard drinks    Types: 1 Standard drinks or equivalent per week    Comment: occ glass of wine or beer  . Drug use: No  . Sexual activity: Yes    Partners: Male    Birth control/protection: Post-menopausal    Comment: Hyst/BSO  Lifestyle  . Physical activity:    Days per week: Not on file    Minutes per session: Not on file  . Stress: Not on file  Relationships  . Social connections:    Talks on phone: Not on file    Gets together: Not on file    Attends religious service: Not on file    Active member of club or organization: Not on file    Attends meetings of clubs or organizations: Not on file    Relationship status: Not on file  . Intimate partner violence:    Fear of current or ex partner: Not on file    Emotionally abused: Not on file    Physically abused: Not on file    Forced sexual activity: Not on file  Other Topics Concern   . Not on file  Social History Narrative  . Not on file    Review of Systems  Constitutional: Negative.   HENT: Negative.   Eyes: Negative.   Respiratory: Negative.   Cardiovascular: Negative.   Gastrointestinal: Negative.   Endocrine: Negative.   Genitourinary: Positive for frequency and urgency.       Pain with urination  Musculoskeletal: Negative.   Skin: Negative.   Allergic/Immunologic: Negative.   Neurological: Negative.   Hematological: Negative.   Psychiatric/Behavioral: Negative.     PHYSICAL EXAMINATION:    BP 106/60 (BP Location: Right Arm, Patient Position: Sitting, Cuff Size: Normal)   Pulse 76   Temp 97.7 F (36.5 C) (Oral)   Resp 16   Ht 5\' 7"  (1.702 m)   Wt 161 lb (73 kg)   LMP 11/12/2014   BMI 25.22 kg/m     General appearance: alert, cooperative and appears stated age   Pelvic: External genitalia:  Small sebaceous cyst of vulva.              Urethra:  normal appearing urethra with no masses, tenderness or lesions  Bartholins and Skenes: normal                 Vagina: normal appearing vagina with normal color and discharge, no lesions              Cervix:  absent                Bimanual Exam:  Uterus:  absent              Adnexa: no mass, fullness, tenderness       Chaperone was present for exam.  ASSESSMENT  UTI.  PLAN  Urine micro and culture.  Bactrim DS po bid x 3 days.  Diflucan 150 mg po x 1.  May repeat in 72 hours prn.  Declines Rx for pyridium.  Return if no improvement in 42 hours. FU prn.    An After Visit Summary was printed and given to the patient.  __15____ minutes face to face time of which over 50% was spent in counseling.

## 2018-09-27 NOTE — Patient Instructions (Signed)
Urinary Tract Infection, Adult A urinary tract infection (UTI) is an infection of any part of the urinary tract. The urinary tract includes:  The kidneys.  The ureters.  The bladder.  The urethra. These organs make, store, and get rid of pee (urine) in the body. What are the causes? This is caused by germs (bacteria) in your genital area. These germs grow and cause swelling (inflammation) of your urinary tract. What increases the risk? You are more likely to develop this condition if:  You have a small, thin tube (catheter) to drain pee.  You cannot control when you pee or poop (incontinence).  You are female, and: ? You use these methods to prevent pregnancy: ? A medicine that kills sperm (spermicide). ? A device that blocks sperm (diaphragm). ? You have low levels of a female hormone (estrogen). ? You are pregnant.  You have genes that add to your risk.  You are sexually active.  You take antibiotic medicines.  You have trouble peeing because of: ? A prostate that is bigger than normal, if you are female. ? A blockage in the part of your body that drains pee from the bladder (urethra). ? A kidney stone. ? A nerve condition that affects your bladder (neurogenic bladder). ? Not getting enough to drink. ? Not peeing often enough.  You have other conditions, such as: ? Diabetes. ? A weak disease-fighting system (immune system). ? Sickle cell disease. ? Gout. ? Injury of the spine. What are the signs or symptoms? Symptoms of this condition include:  Needing to pee right away (urgently).  Peeing often.  Peeing small amounts often.  Pain or burning when peeing.  Blood in the pee.  Pee that smells bad or not like normal.  Trouble peeing.  Pee that is cloudy.  Fluid coming from the vagina, if you are female.  Pain in the belly or lower back. Other symptoms include:  Throwing up (vomiting).  No urge to eat.  Feeling mixed up (confused).  Being tired  and grouchy (irritable).  A fever.  Watery poop (diarrhea). How is this treated? This condition may be treated with:  Antibiotic medicine.  Other medicines.  Drinking enough water. Follow these instructions at home:  Medicines  Take over-the-counter and prescription medicines only as told by your doctor.  If you were prescribed an antibiotic medicine, take it as told by your doctor. Do not stop taking it even if you start to feel better. General instructions  Make sure you: ? Pee until your bladder is empty. ? Do not hold pee for a long time. ? Empty your bladder after sex. ? Wipe from front to back after pooping if you are a female. Use each tissue one time when you wipe.  Drink enough fluid to keep your pee pale yellow.  Keep all follow-up visits as told by your doctor. This is important. Contact a doctor if:  You do not get better after 1-2 days.  Your symptoms go away and then come back. Get help right away if:  You have very bad back pain.  You have very bad pain in your lower belly.  You have a fever.  You are sick to your stomach (nauseous).  You are throwing up. Summary  A urinary tract infection (UTI) is an infection of any part of the urinary tract.  This condition is caused by germs in your genital area.  There are many risk factors for a UTI. These include having a small, thin   tube to drain pee and not being able to control when you pee or poop.  Treatment includes antibiotic medicines for germs.  Drink enough fluid to keep your pee pale yellow. This information is not intended to replace advice given to you by your health care provider. Make sure you discuss any questions you have with your health care provider. Document Released: 02/24/2008 Document Revised: 03/17/2018 Document Reviewed: 03/17/2018 Elsevier Interactive Patient Education  2019 Elsevier Inc.  

## 2018-09-28 LAB — URINALYSIS, MICROSCOPIC ONLY

## 2018-09-30 LAB — URINE CULTURE

## 2018-10-14 ENCOUNTER — Other Ambulatory Visit: Payer: Self-pay | Admitting: Rheumatology

## 2018-10-14 NOTE — Telephone Encounter (Signed)
Last Visit: 08/02/18 Next Visit: 01/03/19 Labs: 06/14/18 anemia All other labs are WNL Eye exam:  09/28/18 WNL  Okay to refill per Dr. Estanislado Pandy

## 2018-10-31 ENCOUNTER — Other Ambulatory Visit: Payer: Self-pay

## 2018-10-31 DIAGNOSIS — Z79899 Other long term (current) drug therapy: Secondary | ICD-10-CM

## 2018-11-01 LAB — COMPLETE METABOLIC PANEL WITH GFR
AG Ratio: 1.5 (calc) (ref 1.0–2.5)
ALBUMIN MSPROF: 4 g/dL (ref 3.6–5.1)
ALT: 15 U/L (ref 6–29)
AST: 18 U/L (ref 10–35)
Alkaline phosphatase (APISO): 62 U/L (ref 37–153)
BUN: 12 mg/dL (ref 7–25)
CO2: 28 mmol/L (ref 20–32)
Calcium: 10 mg/dL (ref 8.6–10.4)
Chloride: 102 mmol/L (ref 98–110)
Creat: 0.74 mg/dL (ref 0.50–1.05)
GFR, Est African American: 106 mL/min/{1.73_m2} (ref >=60)
GFR, Est Non African American: 92 mL/min/{1.73_m2} (ref >=60)
GLOBULIN: 2.7 g/dL (ref 1.9–3.7)
Glucose, Bld: 136 mg/dL — ABNORMAL HIGH (ref 65–99)
Potassium: 4.8 mmol/L (ref 3.5–5.3)
Sodium: 140 mmol/L (ref 135–146)
Total Bilirubin: 0.5 mg/dL (ref 0.2–1.2)
Total Protein: 6.7 g/dL (ref 6.1–8.1)

## 2018-11-01 LAB — CBC WITH DIFFERENTIAL/PLATELET
ABSOLUTE MONOCYTES: 356 {cells}/uL (ref 200–950)
BASOS ABS: 38 {cells}/uL (ref 0–200)
Basophils Relative: 0.7 %
Eosinophils Absolute: 167 cells/uL (ref 15–500)
Eosinophils Relative: 3.1 %
HCT: 42.7 % (ref 35.0–45.0)
HEMOGLOBIN: 14.8 g/dL (ref 11.7–15.5)
Lymphs Abs: 1636 cells/uL (ref 850–3900)
MCH: 30.5 pg (ref 27.0–33.0)
MCHC: 34.7 g/dL (ref 32.0–36.0)
MCV: 87.9 fL (ref 80.0–100.0)
MPV: 11.3 fL (ref 7.5–12.5)
Monocytes Relative: 6.6 %
Neutro Abs: 3202 cells/uL (ref 1500–7800)
Neutrophils Relative %: 59.3 %
Platelets: 218 10*3/uL (ref 140–400)
RBC: 4.86 10*6/uL (ref 3.80–5.10)
RDW: 15.7 % — ABNORMAL HIGH (ref 11.0–15.0)
Total Lymphocyte: 30.3 %
WBC: 5.4 10*3/uL (ref 3.8–10.8)

## 2018-11-15 ENCOUNTER — Ambulatory Visit: Payer: 59 | Admitting: Physician Assistant

## 2018-11-18 ENCOUNTER — Encounter: Payer: Self-pay | Admitting: Gastroenterology

## 2019-01-03 ENCOUNTER — Ambulatory Visit: Payer: 59 | Admitting: Physician Assistant

## 2019-01-23 ENCOUNTER — Other Ambulatory Visit: Payer: Self-pay | Admitting: Rheumatology

## 2019-01-23 DIAGNOSIS — M0579 Rheumatoid arthritis with rheumatoid factor of multiple sites without organ or systems involvement: Secondary | ICD-10-CM

## 2019-01-23 NOTE — Telephone Encounter (Signed)
Last Visit: 08/02/2018 Next Visit: 02/21/2019 Labs: 10/31/2018 Glucose is elevated-136. RDW stable. Rest of lab work is WNL. Eye exam: 09/28/2018  Okay to refill per Dr. Estanislado Pandy.

## 2019-02-07 NOTE — Progress Notes (Deleted)
Office Visit Note  Patient: Rebecca Fleming             Date of Birth: 05-09-1966           MRN: [id number]             PCP: Nunzio Cobbs, MD Referring: Aundria Rud* Visit Date: 02/21/2019 Occupation: @GUAROCC @  Subjective:  No chief complaint on file.   History of Present Illness: Rebecca Fleming is a 53 y.o. female ***   Activities of Daily Living:  Patient reports morning stiffness for *** {minute/hour:19697}.   Patient {ACTIONS;DENIES/REPORTS:21021675::"Denies"} nocturnal pain.  Difficulty dressing/grooming: {ACTIONS;DENIES/REPORTS:21021675::"Denies"} Difficulty climbing stairs: {ACTIONS;DENIES/REPORTS:21021675::"Denies"} Difficulty getting out of chair: {ACTIONS;DENIES/REPORTS:21021675::"Denies"} Difficulty using hands for taps, buttons, cutlery, and/or writing: {ACTIONS;DENIES/REPORTS:21021675::"Denies"}  No Rheumatology ROS completed.   PMFS History:  Patient Active Problem List   Diagnosis Date Noted  . Primary osteoarthritis of both knees 09/01/2017  . Scoliosis 09/01/2017  . High risk medication use 11/11/2016  . Primary osteoarthritis of both hands 11/11/2016  . Lateral epicondylitis, right elbow 11/11/2016  . Myopia with astigmatism and presbyopia, bilateral 09/09/2016  . 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 Status post laparoscopic assisted vaginal hysterectomy (LAVH) 01/15/2015  . Breast cancer, left breast ([place]) 11/07/2014  . Postmenopausal bleeding 07/13/2013  . Rheumatoid arthritis ([place]) 01/11/2012  . Psoriasis 01/11/2012    Past Medical History:  Diagnosis Date  . Abnormal uterine bleeding   . Allergy   . Anemia   . Arthritis   . Asthma    exercised induced asthma - rarely uses inhaler  . Blood transfusion without reported diagnosis 1976   1976  following surgery at [facility]   . Cancer ([place]) 08/2008   left breast--has bilateral mastectomy  . Complication of anesthesia    pt states" gets shakes" with  general  . Cyst near tailbone   . Elevated LDL cholesterol level   . Environmental and seasonal allergies   . Fibroid   . Headache    sleeps , no meds  . Heart murmur    never has caused any problems  . Helicobacter pylori infection   . Hemorrhoids    hx of  . History of breast cancer   . History of shingles    03/07/18  . Hypotension    hx of  . Post-operative nausea and vomiting   . RA (rheumatoid arthritis) ([place])   . RA (rheumatoid arthritis) ([place])   . Scoliosis    tx with robaxin prn - rarely uses  . SVD (spontaneous vaginal delivery) 1996   x 1    Family History  Problem Relation Age of Onset  . Breast cancer Mother   . Diabetes Mother   . Hypertension Mother   . Stroke Mother   . Irritable bowel syndrome Mother   . Kidney disease Mother   . Heart failure Father   . Hypertension Father   . Colon cancer Neg Hx   . Colon polyps Neg Hx   . Esophageal cancer Neg Hx   . Pancreatic cancer Neg Hx   . Rectal cancer Neg Hx   . Stomach cancer Neg Hx    Past Surgical History:  Procedure Laterality Date  . ABDOMINAL HYSTERECTOMY    . APPENDECTOMY    . BREAST LUMPECTOMY  05/17/2008   x 2 re excise for margin - Dr Margot Chimes, bilateral mastectomy 08/2008  . BREAST LUMPECTOMY W/ NEEDLE LOCALIZATION  04/23/2008   w  SLN Dr Margot Chimes  . CESAREAN SECTION  01/01/2004   Dr Quincy Simmonds  . COLONOSCOPY  03/2018  . ENDOMETRIAL BIOPSY  06/2013, 2015   x 2  . EYE SURGERY  2003   bilateral lasik  . FOOT SURGERY    . HYSTEROSCOPY W/D&C  07/21/2011   Procedure: DILATATION AND CURETTAGE (D&C) /HYSTEROSCOPY;  Surgeon: Arloa Koh;  Location: [facility];  Service: Gynecology;  Laterality: N/A;  with Ultrasound Guidance  . INSERTION OF TISSUE EXPANDER AFTER MASTECTOMY  09/11/2008   Dr Harlow Mares  . KNEE ARTHROSCOPY W/ MENISCAL REPAIR Right 2010   --Dr. Telford Nab  . LAPAROSCOPIC ASSISTED VAGINAL HYSTERECTOMY N/A 01/15/2015   Procedure: LAPAROSCOPIC ASSISTED VAGINAL HYSTERECTOMY;  Surgeon: Nunzio Cobbs, MD;  Location: [facility];  Service: Gynecology;  Laterality: N/A;  . left foot surgery  2004  . left rotator cuff repair  01/2006  . MASTECTOMY  09/11/2008   bilateral - Dr Margot Chimes  . PORT-A-CATH REMOVAL  09/09/2009   Dr Margot Chimes  . PORTACATH PLACEMENT  05/17/2008   Dr Cori Razor rounds of chemo  . REMOVAL OF TISSUE EXPANDER AND PLACEMENT OF IMPLANT  12/2008  . RHINOPLASTY    . SALPINGOOPHORECTOMY Bilateral 01/15/2015   Procedure: BILATERAL SALPINGO OOPHORECTOMY;  Surgeon: Nunzio Cobbs, MD;  Location: [facility];  Service: Gynecology;  Laterality: Bilateral;  . UMBILICAL HERNIA REPAIR  09/09/2009   Dr Margot Chimes  . UPPER GI ENDOSCOPY  03/2018  . WISDOM TOOTH EXTRACTION     Social History   Social History Narrative  . Not on file   Immunization History  Administered Date(s) Administered  . Pneumococcal Polysaccharide-23 01/16/2015  . Tdap 09/21/2010     Objective: Vital Signs: LMP 11/12/2014    Physical Exam   Musculoskeletal Exam: ***  CDAI Exam: CDAI Score: Not documented Patient Global Assessment: Not documented; Provider Global Assessment: Not documented Swollen: Not documented; Tender: Not documented Joint Exam   Not documented   There is currently no information documented on the homunculus. Go to the Rheumatology activity and complete the homunculus joint exam.  Investigation: No additional findings.  Imaging: No results found.  Recent Labs: Lab Results  Component Value Date   WBC 5.4 10/31/2018   HGB 14.8 10/31/2018   PLT 218 10/31/2018   NA 140 10/31/2018   K 4.8 10/31/2018   CL 102 10/31/2018   CO2 28 10/31/2018   GLUCOSE 136 (H) 10/31/2018   BUN 12 10/31/2018   CREATININE 0.74 10/31/2018   BILITOT 0.5 10/31/2018   ALKPHOS 66 03/31/2017   AST 18 10/31/2018   ALT 15 10/31/2018   PROT 6.7 10/31/2018   ALBUMIN 4.1 03/31/2017   CALCIUM 10.0 10/31/2018   GFRAA 106 10/31/2018    Speciality Comments: PLQ Eye Exam: 09/28/18 WNL @ [place]   Procedures:  No procedures performed Allergies: Latex; Other; and Percocet [oxycodone-acetaminophen]   Assessment / Plan:     Visit Diagnoses: No diagnosis found.   Orders: No orders of the defined types were placed in this encounter.  No orders of the defined types were placed in this encounter.   Face-to-face time spent with patient was *** minutes. Greater than 50% of time was spent in counseling and coordination of care.  Follow-Up Instructions: No follow-ups on file.   Earnestine Mealing, CMA  Note - This record has been created using Editor, commissioning.  Chart creation errors have been sought, but may not always  have been located.  Such creation errors do not reflect on  the standard of medical care.

## 2019-02-07 NOTE — Progress Notes (Signed)
Office Visit Note  Patient: Rebecca Fleming             Date of Birth: [birth year]           MRN: [id number]             PCP: Nunzio Cobbs, MD Referring: Aundria Rud* Visit Date: 02/09/2019 Occupation: @GUAROCC @  Subjective:  Medication monitoring.   History of Present Illness: Rebecca Fleming is a 53 y.o. female with history of seropositive rheumatoid arthritis and osteoarthritis.  She states she has been doing quite well on Plaquenil.  She denies any joint pain or joint swelling currently.  She does have some discomfort due to underlying osteoarthritis.  She has not had any psoriasis lesions recently.  Her right epicondylitis is better.  She continues to have chronic lower back pain and thoracic pain due to scoliosis.  The Morton's neuroma in her right foot has resolved.  Activities of Daily Living:  Patient reports morning stiffness for 5 minutes.   Patient Denies nocturnal pain.  Difficulty dressing/grooming: Denies Difficulty climbing stairs: Denies Difficulty getting out of chair: Denies Difficulty using hands for taps, buttons, cutlery, and/or writing: Reports  Review of Systems  Constitutional: Negative for fatigue.  HENT: Negative for mouth sores, mouth dryness and nose dryness.   Eyes: Negative for redness, itching and dryness.  Respiratory: Negative for shortness of breath and difficulty breathing.   Cardiovascular: Negative for chest pain and hypertension.  Gastrointestinal: Negative for abdominal pain, constipation and diarrhea.  Endocrine: Negative for increased urination.  Genitourinary: Negative for painful urination and pelvic pain.  Musculoskeletal: Positive for arthralgias, joint pain and morning stiffness. Negative for joint swelling.  Skin: Negative for rash and redness.  Allergic/Immunologic: Negative for susceptible to infections.  Neurological: Positive for headaches. Negative for dizziness, light-headedness, memory loss and weakness.   Hematological: Negative for bruising/bleeding tendency.  Psychiatric/Behavioral: Negative for confusion. The patient is not nervous/anxious.     PMFS History:  Patient Active Problem List   Diagnosis Date Noted  . Primary osteoarthritis of both knees 09/01/2017  . Scoliosis 09/01/2017  . High risk medication use 11/11/2016  . Primary osteoarthritis of both hands 11/11/2016  . Lateral epicondylitis, right elbow 11/11/2016  . Myopia with astigmatism and presbyopia, bilateral 09/09/2016  . 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 Status post laparoscopic assisted vaginal hysterectomy (LAVH) 01/15/2015  . Breast cancer, left breast ([place]) 11/07/2014  . Postmenopausal bleeding 07/13/2013  . Rheumatoid arthritis ([place]) 01/11/2012  . Psoriasis 01/11/2012    Past Medical History:  Diagnosis Date  . Abnormal uterine bleeding   . Allergy   . Anemia   . Arthritis   . Asthma    exercised induced asthma - rarely uses inhaler  . Blood transfusion without reported diagnosis 1976   1976  following surgery at [facility]   . Cancer ([place]) 08/2008   left breast--has bilateral mastectomy  . Complication of anesthesia    pt states" gets shakes" with general  . Cyst near tailbone   . Elevated LDL cholesterol level   . Environmental and seasonal allergies   . Fibroid   . Headache    sleeps , no meds  . Heart murmur    never has caused any problems  . Helicobacter pylori infection   . Hemorrhoids    hx of  . History of breast cancer   . History of shingles    03/07/18  . Hypotension  hx of  . Post-operative nausea and vomiting   . RA (rheumatoid arthritis) ([place])   . RA (rheumatoid arthritis) ([place])   . Scoliosis    tx with robaxin prn - rarely uses  . SVD (spontaneous vaginal delivery) 1996   x 1    Family History  Problem Relation Age of Onset  . Breast cancer Mother   . Diabetes Mother   . Hypertension Mother   . Stroke Mother   . Irritable bowel syndrome Mother    . Kidney disease Mother   . Heart failure Father   . Hypertension Father   . Colon cancer Neg Hx   . Colon polyps Neg Hx   . Esophageal cancer Neg Hx   . Pancreatic cancer Neg Hx   . Rectal cancer Neg Hx   . Stomach cancer Neg Hx    Past Surgical History:  Procedure Laterality Date  . ABDOMINAL HYSTERECTOMY    . APPENDECTOMY    . BREAST LUMPECTOMY  05/17/2008   x 2 re excise for margin - Dr Margot Chimes, bilateral mastectomy 08/2008  . BREAST LUMPECTOMY W/ NEEDLE LOCALIZATION  04/23/2008   w SLN Dr Margot Chimes  . CESAREAN SECTION  01/01/2004   Dr Quincy Simmonds  . COLONOSCOPY  03/2018  . ENDOMETRIAL BIOPSY  06/2013, 2015   x 2  . EYE SURGERY  2003   bilateral lasik  . FOOT SURGERY    . HYSTEROSCOPY W/D&C  07/21/2011   Procedure: DILATATION AND CURETTAGE (D&C) /HYSTEROSCOPY;  Surgeon: Arloa Koh;  Location: [facility];  Service: Gynecology;  Laterality: N/A;  with Ultrasound Guidance  . INSERTION OF TISSUE EXPANDER AFTER MASTECTOMY  09/11/2008   Dr Harlow Mares  . KNEE ARTHROSCOPY W/ MENISCAL REPAIR Right 2010   --Dr. Telford Nab  . LAPAROSCOPIC ASSISTED VAGINAL HYSTERECTOMY N/A 01/15/2015   Procedure: LAPAROSCOPIC ASSISTED VAGINAL HYSTERECTOMY;  Surgeon: Nunzio Cobbs, MD;  Location: [facility];  Service: Gynecology;  Laterality: N/A;  . left foot surgery  2004  . left rotator cuff repair  01/2006  . MASTECTOMY  09/11/2008   bilateral - Dr Margot Chimes  . PORT-A-CATH REMOVAL  09/09/2009   Dr Margot Chimes  . PORTACATH PLACEMENT  05/17/2008   Dr Cori Razor rounds of chemo  . REMOVAL OF TISSUE EXPANDER AND PLACEMENT OF IMPLANT  12/2008  . RHINOPLASTY    . SALPINGOOPHORECTOMY Bilateral 01/15/2015   Procedure: BILATERAL SALPINGO OOPHORECTOMY;  Surgeon: Nunzio Cobbs, MD;  Location: [facility];  Service: Gynecology;  Laterality: Bilateral;  . UMBILICAL HERNIA REPAIR  09/09/2009   Dr Margot Chimes  . UPPER GI ENDOSCOPY  03/2018  . WISDOM TOOTH EXTRACTION     Social History   Social History Narrative  . Not on  file   Immunization History  Administered Date(s) Administered  . Pneumococcal Polysaccharide-23 01/16/2015  . Tdap 09/21/2010     Objective: Vital Signs: BP 109/66 (BP Location: Right Arm, Patient Position: Sitting, Cuff Size: Normal)   Pulse (!) 56   Resp 13   Ht 5' 6.5" (1.689 m)   Wt 165 lb (74.8 kg)   LMP 11/12/2014   BMI 26.23 kg/m    Physical Exam Vitals signs and nursing note reviewed.  Constitutional:      Appearance: She is well-developed.  HENT:     Head: Normocephalic and atraumatic.  Eyes:     Conjunctiva/sclera: Conjunctivae normal.  Neck:     Musculoskeletal: Normal range of motion.  Cardiovascular:     Rate and Rhythm:  Normal rate and regular rhythm.     Heart sounds: Normal heart sounds.  Pulmonary:     Effort: Pulmonary effort is normal.     Breath sounds: Normal breath sounds.  Abdominal:     General: Bowel sounds are normal.     Palpations: Abdomen is soft.  Lymphadenopathy:     Cervical: No cervical adenopathy.  Skin:    General: Skin is warm and dry.     Capillary Refill: Capillary refill takes less than 2 seconds.  Neurological:     Mental Status: She is alert and oriented to person, place, and time.  Psychiatric:        Behavior: Behavior normal.      Musculoskeletal Exam: C-spine good range of motion.  She has thoracolumbar scoliosis.  Shoulder joints elbow joints wrist joints were in good range of motion.  She has some DIP and PIP thickening in her hands.  She is some crepitus in her knee joints without any warmth swelling or effusion.  CDAI Exam: CDAI Score: 0.4  Patient Global Assessment: 2 (mm); Provider Global Assessment: 2 (mm) Swollen: 0 ; Tender: 0  Joint Exam   Not documented   There is currently no information documented on the homunculus. Go to the Rheumatology activity and complete the homunculus joint exam.  Investigation: No additional findings.  Imaging: No results found.  Recent Labs: Lab Results  Component  Value Date   WBC 5.4 10/31/2018   HGB 14.8 10/31/2018   PLT 218 10/31/2018   NA 140 10/31/2018   K 4.8 10/31/2018   CL 102 10/31/2018   CO2 28 10/31/2018   GLUCOSE 136 (H) 10/31/2018   BUN 12 10/31/2018   CREATININE 0.74 10/31/2018   BILITOT 0.5 10/31/2018   ALKPHOS 66 03/31/2017   AST 18 10/31/2018   ALT 15 10/31/2018   PROT 6.7 10/31/2018   ALBUMIN 4.1 03/31/2017   CALCIUM 10.0 10/31/2018   GFRAA 106 10/31/2018    Speciality Comments: PLQ Eye Exam: 09/28/18 WNL @ [place]   Procedures:  No procedures performed Allergies: Latex; Other; and Percocet [oxycodone-acetaminophen]   Assessment / Plan:     Visit Diagnoses: Rheumatoid arthritis involving multiple sites with positive rheumatoid factor (HCC) - +RF, +CCP.  Patient had no synovitis on examination today.  She has been tolerating Plaquenil well without any side effects.  Her eye exam has been up-to-date.  High risk medication use - PLQ 200 mg p.o. twice daily.  She had labs in February.  She will need repeat labs in July.  Primary osteoarthritis of both hands-she has occasional discomfort in her bilateral CMC joints.  Joint protection was discussed.  Primary osteoarthritis of both knees-she is currently not having much discomfort in her knee joints.  Chondromalacia of left patella-she has some discomfort with stairs.  Psoriasis-she currently does not have any active lesions.  Lateral epicondylitis, right elbow-resolved.  Scoliosis, unspecified scoliosis type, unspecified spinal region-she continues to have some chronic lower back pain.  Morton's neuroma of right foot-resolved.  Other medical problems are listed as follows:  Heart murmur  History of asthma  History of breast cancer  History of anemia   Orders: No orders of the defined types were placed in this encounter.  No orders of the defined types were placed in this encounter.     Follow-Up Instructions: Return in about 5 months  (around 07/12/2019) for Rheumatoid arthritis, Osteoarthritis.   Bo Merino, MD  Note - This record has been created  using Editor, commissioning.  Chart creation errors have been sought, but may not always  have been located. Such creation errors do not reflect on  the standard of medical care.

## 2019-02-09 ENCOUNTER — Ambulatory Visit: Payer: 59 | Admitting: Rheumatology

## 2019-02-09 ENCOUNTER — Other Ambulatory Visit: Payer: Self-pay

## 2019-02-09 ENCOUNTER — Encounter: Payer: Self-pay | Admitting: Physician Assistant

## 2019-02-09 VITALS — BP 109/66 | HR 56 | Resp 13 | Ht 66.5 in | Wt 165.0 lb

## 2019-02-09 DIAGNOSIS — Z853 Personal history of malignant neoplasm of breast: Secondary | ICD-10-CM

## 2019-02-09 DIAGNOSIS — M2242 Chondromalacia patellae, left knee: Secondary | ICD-10-CM

## 2019-02-09 DIAGNOSIS — M170 Bilateral primary osteoarthritis of knee: Secondary | ICD-10-CM | POA: Diagnosis not present

## 2019-02-09 DIAGNOSIS — M7711 Lateral epicondylitis, right elbow: Secondary | ICD-10-CM

## 2019-02-09 DIAGNOSIS — G5761 Lesion of plantar nerve, right lower limb: Secondary | ICD-10-CM

## 2019-02-09 DIAGNOSIS — M19041 Primary osteoarthritis, right hand: Secondary | ICD-10-CM | POA: Diagnosis not present

## 2019-02-09 DIAGNOSIS — Z79899 Other long term (current) drug therapy: Secondary | ICD-10-CM | POA: Diagnosis not present

## 2019-02-09 DIAGNOSIS — L409 Psoriasis, unspecified: Secondary | ICD-10-CM

## 2019-02-09 DIAGNOSIS — M19042 Primary osteoarthritis, left hand: Secondary | ICD-10-CM

## 2019-02-09 DIAGNOSIS — R011 Cardiac murmur, unspecified: Secondary | ICD-10-CM

## 2019-02-09 DIAGNOSIS — M0579 Rheumatoid arthritis with rheumatoid factor of multiple sites without organ or systems involvement: Secondary | ICD-10-CM | POA: Diagnosis not present

## 2019-02-09 DIAGNOSIS — M419 Scoliosis, unspecified: Secondary | ICD-10-CM

## 2019-02-09 DIAGNOSIS — Z8709 Personal history of other diseases of the respiratory system: Secondary | ICD-10-CM

## 2019-02-09 DIAGNOSIS — Z862 Personal history of diseases of the blood and blood-forming organs and certain disorders involving the immune mechanism: Secondary | ICD-10-CM

## 2019-02-09 NOTE — Patient Instructions (Signed)
Standing Labs We placed an order today for your standing lab work.    Please come back and get your standing labs in July  We have open lab Monday through Friday from 8:30-11:30 AM and 1:30-4:00 PM  at the office of Dr. Bo Merino.   You may experience shorter wait times on Monday and Friday afternoons. The office is located at [address], [place], [place], [place] [address] No appointment is necessary.   Labs are drawn by [facility].  You may receive a bill from [facility] for your lab work.  If you wish to have your labs drawn at another location, please call the office 24 hours in advance to send orders.  If you have any questions regarding directions or hours of operation,  please call [phone].   Just as a reminder please drink plenty of water prior to coming for your lab work. Thanks!

## 2019-02-21 ENCOUNTER — Ambulatory Visit: Payer: Self-pay | Admitting: Physician Assistant

## 2019-02-24 ENCOUNTER — Ambulatory Visit: Payer: 59 | Admitting: Obstetrics and Gynecology

## 2019-03-02 ENCOUNTER — Other Ambulatory Visit: Payer: Self-pay | Admitting: Gastroenterology

## 2019-04-07 ENCOUNTER — Other Ambulatory Visit: Payer: Self-pay

## 2019-04-11 ENCOUNTER — Encounter: Payer: Self-pay | Admitting: Obstetrics and Gynecology

## 2019-04-11 ENCOUNTER — Other Ambulatory Visit: Payer: Self-pay

## 2019-04-11 ENCOUNTER — Ambulatory Visit: Payer: 59 | Admitting: Obstetrics and Gynecology

## 2019-04-11 VITALS — BP 116/72 | HR 72 | Temp 98.6°F | Resp 12 | Ht 66.0 in | Wt 165.0 lb

## 2019-04-11 DIAGNOSIS — Z780 Asymptomatic menopausal state: Secondary | ICD-10-CM

## 2019-04-11 DIAGNOSIS — Z01419 Encounter for gynecological examination (general) (routine) without abnormal findings: Secondary | ICD-10-CM | POA: Diagnosis not present

## 2019-04-11 DIAGNOSIS — Z8639 Personal history of other endocrine, nutritional and metabolic disease: Secondary | ICD-10-CM | POA: Diagnosis not present

## 2019-04-11 NOTE — Progress Notes (Signed)
53 y.o. G2P2 Married Caucasian female here for annual exam.    Has a potential ingrown hair in her groin area. Now inflamed but not really painful.  She has a history of anemia with low ferritin and low iron.   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   PCP: No PCP    Patient's last menstrual period was 11/12/2014.           Sexually active: Yes.    The current method of family planning is status post hysterectomy.    Exercising: Yes.    aerobics Smoker:  no  Health Maintenance: Pap:  08/01/14 Neg:Neg HR HPV History of abnormal Pap:  no MMG:  2009 Bil.mastectomy Colonoscopy:  03/30/18 Polypectomy f/u 6 months -- scheduled August 2020 BMD:   09/28/13  Result  Osteopenia TDaP:  2012 HIV and Hep C: negative in the past Screening Labs:  PCP   reports that she has never smoked. She has never used smokeless tobacco. She reports current alcohol use of about 1.0 standard drinks of alcohol per week. She reports that she does not use drugs.  Past Medical History:  Diagnosis Date  . Abnormal uterine bleeding   . Allergy   . Anemia   . Arthritis   . Asthma    exercised induced asthma - rarely uses inhaler  . Blood transfusion without reported diagnosis 1976   1976  following surgery at [facility]   . Cancer ([place]) 08/2008   left breast--has bilateral mastectomy  . Complication of anesthesia    pt states" gets shakes" with general  . Cyst near tailbone   . Elevated LDL cholesterol level   . Environmental and seasonal allergies   . Fibroid   . Headache    sleeps , no meds  . Heart murmur    never has caused any problems  . Helicobacter pylori infection   . Hemorrhoids    hx of  . History of breast cancer   . History of shingles    03/07/18  . Hypotension    hx of  . Post-operative nausea and vomiting   . RA (rheumatoid arthritis) ([place])   . RA (rheumatoid arthritis) ([place])   . Scoliosis    tx with robaxin prn - rarely uses  . SVD (spontaneous vaginal delivery) 1996    x 1    Past Surgical History:  Procedure Laterality Date  . ABDOMINAL HYSTERECTOMY    . APPENDECTOMY    . BREAST LUMPECTOMY  05/17/2008   x 2 re excise for margin - Dr Margot Chimes, bilateral mastectomy 08/2008  . BREAST LUMPECTOMY W/ NEEDLE LOCALIZATION  04/23/2008   w [facility] Dr Margot Chimes  . CESAREAN SECTION  01/01/2004   Dr Quincy Simmonds  . COLONOSCOPY  03/2018  . ENDOMETRIAL BIOPSY  06/2013, 2015   x 2  . EYE SURGERY  2003   bilateral lasik  . FOOT SURGERY    . HYSTEROSCOPY W/D&C  07/21/2011   Procedure: DILATATION AND CURETTAGE (D&C) /HYSTEROSCOPY;  Surgeon: Arloa Koh;  Location: [facility];  Service: Gynecology;  Laterality: N/A;  with Ultrasound Guidance  . INSERTION OF TISSUE EXPANDER AFTER MASTECTOMY  09/11/2008   Dr Harlow Mares  . KNEE ARTHROSCOPY W/ MENISCAL REPAIR Right 2010   --Dr. Telford Nab  . LAPAROSCOPIC ASSISTED VAGINAL HYSTERECTOMY N/A 01/15/2015   Procedure: LAPAROSCOPIC ASSISTED VAGINAL HYSTERECTOMY;  Surgeon: Nunzio Cobbs, MD;  Location: [facility];  Service: Gynecology;  Laterality: N/A;  . left foot surgery  2004  .  left rotator cuff repair  01/2006  . MASTECTOMY  09/11/2008   bilateral - Dr Margot Chimes  . PORT-A-CATH REMOVAL  09/09/2009   Dr Margot Chimes  . PORTACATH PLACEMENT  05/17/2008   Dr Cori Razor rounds of chemo  . REMOVAL OF TISSUE EXPANDER AND PLACEMENT OF IMPLANT  12/2008  . RHINOPLASTY    . SALPINGOOPHORECTOMY Bilateral 01/15/2015   Procedure: BILATERAL SALPINGO OOPHORECTOMY;  Surgeon: Nunzio Cobbs, MD;  Location: [facility];  Service: Gynecology;  Laterality: Bilateral;  . UMBILICAL HERNIA REPAIR  09/09/2009   Dr Margot Chimes  . UPPER GI ENDOSCOPY  03/2018  . WISDOM TOOTH EXTRACTION      Current Outpatient Medications  Medication Sig Dispense Refill  . calcium carbonate (OS-CAL) 600 MG TABS tablet Take 600 mg by mouth 2 (two) times daily with a meal.    . cetirizine (ZYRTEC) 10 MG tablet Take 10 mg by mouth daily.      . cyclobenzaprine (FLEXERIL) 5 MG tablet Take 5 mg  by mouth as needed for muscle spasms.    . folic acid (FOLVITE) 1 MG tablet TAKE 2 TABLETS BY MOUTH EVERY DAY 180 tablet 2  . GLUCOSAMINE PO Take 2 tablets by mouth daily.     . hydroxychloroquine (PLAQUENIL) 200 MG tablet TAKE 1 TABLET BY MOUTH TWICE A DAY 180 tablet 0  . ibuprofen (ADVIL) 200 MG tablet Take 200 mg by mouth as needed.    . Magnesium 500 MG TABS Take 1 tablet by mouth 2 (two) times daily.    . Manganese 50 MG TABS Take 1 tablet by mouth daily.    . mometasone (NASONEX) 50 MCG/ACT nasal spray Place 2 sprays into the nose as needed.     . pantoprazole (PROTONIX) 40 MG tablet TAKE 1 TABLET BY MOUTH EVERY DAY 90 tablet 3  . Plant Sterols and Stanols (CHOLESTOFF PO) Take by mouth daily.      Current Facility-Administered Medications  Medication Dose Route Frequency Provider Last Rate Last Dose  . 0.9 %  sodium chloride infusion  500 mL Intravenous Once Ladene Artist, MD        Family History  Problem Relation Age of Onset  . Breast cancer Mother   . Diabetes Mother   . Hypertension Mother   . Stroke Mother   . Irritable bowel syndrome Mother   . Kidney disease Mother   . Heart failure Father   . Hypertension Father   . Colon cancer Neg Hx   . Colon polyps Neg Hx   . Esophageal cancer Neg Hx   . Pancreatic cancer Neg Hx   . Rectal cancer Neg Hx   . Stomach cancer Neg Hx     Review of Systems  Constitutional: Negative.   HENT: Negative.   Eyes: Negative.   Respiratory: Negative.   Cardiovascular: Negative.   Gastrointestinal: Negative.   Endocrine: Negative.   Genitourinary: Negative.   Musculoskeletal: Negative.   Skin: Negative.   Allergic/Immunologic: Negative.   Neurological: Negative.   Hematological: Negative.   Psychiatric/Behavioral: Negative.     Exam:   BP 116/72 (BP Location: Right Arm, Patient Position: Sitting, Cuff Size: Normal)   Pulse 72   Temp 98.6 F (37 C) (Temporal)   Resp 12   Ht 5\' 6"  (1.676 m)   Wt 165 lb (74.8 kg)   LMP  11/12/2014   BMI 26.63 kg/m     General appearance: alert, cooperative and appears stated age Head: normocephalic, without obvious abnormality,  atraumatic Neck: no adenopathy, supple, symmetrical, trachea midline and thyroid normal to inspection and palpation Lungs: clear to auscultation bilaterally Breasts: normal appearance, no masses or tenderness, No nipple retraction or dimpling, No nipple discharge or bleeding, No axillary adenopathy Heart: regular rate and rhythm Abdomen: soft, non-tender; no masses, no organomegaly Extremities: extremities normal, atraumatic, no cyanosis or edema Skin: skin color, texture, turgor normal. Right thigh with 6 mm sebaceous cyst with small amount of surrounding erythema. Lymph nodes: cervical, supraclavicular, and axillary nodes normal. Neurologic: grossly normal  Pelvic: External genitalia:  no lesions              No abnormal inguinal nodes palpated.              Urethra:  normal appearing urethra with no masses, tenderness or lesions              Bartholins and Skenes: normal                 Vagina: normal appearing vagina with normal color and discharge, no lesions              Cervix:  absent              Pap taken: No. Bimanual Exam:  Uterus:   absent              Adnexa: no mass, fullness, tenderness              Rectal exam: Yes.  .  Confirms.              Anus:  normal sphincter tone, no lesions  Chaperone was present for exam.  Assessment:   Well woman visit with normal exam. Status post bilateral mastectomywith reconstruction. Status post LAVH/BSO.  Osteopenia. RA. Hx anemia with unknown etiology. Hx elevated glucose.  Right thigh sebaceous cyst.   Plan: Mammogram screening discussed. Self breast awareness reviewed. Pap and HR HPV as above. Guidelines for Calcium, Vitamin D, regular exercise program including cardiovascular and weight bearing exercise. BMD at [facility]. Lipid profile, iron and ferritin, and A1C.   Colonoscopy pending.  Information on sebaceous cysts. Follow up annually and prn.   After visit summary provided.

## 2019-04-11 NOTE — Patient Instructions (Addendum)
EXERCISE AND DIET:  We recommended that you start or continue a regular exercise program for good health. Regular exercise means any activity that makes your heart beat faster and makes you sweat.  We recommend exercising at least 30 minutes per day at least 3 days a week, preferably 4 or 5.  We also recommend a diet low in fat and sugar.  Inactivity, poor dietary choices and obesity can cause diabetes, heart attack, stroke, and kidney damage, among others.    ALCOHOL AND SMOKING:  Women should limit their alcohol intake to no more than 7 drinks/beers/glasses of wine (combined, not each!) per week. Moderation of alcohol intake to this level decreases your risk of breast cancer and liver damage. And of course, no recreational drugs are part of a healthy lifestyle.  And absolutely no smoking or even second hand smoke. Most people know smoking can cause heart and lung diseases, but did you know it also contributes to weakening of your bones? Aging of your skin?  Yellowing of your teeth and nails?  CALCIUM AND VITAMIN D:  Adequate intake of calcium and Vitamin D are recommended.  The recommendations for exact amounts of these supplements seem to change often, but generally speaking 600 mg of calcium (either carbonate or citrate) and 800 units of Vitamin D per day seems prudent. Certain women may benefit from higher intake of Vitamin D.  If you are among these women, your doctor will have told you during your visit.    PAP SMEARS:  Pap smears, to check for cervical cancer or precancers,  have traditionally been done yearly, although recent scientific advances have shown that most women can have pap smears less often.  However, every woman still should have a physical exam from her gynecologist every year. It will include a breast check, inspection of the vulva and vagina to check for abnormal growths or skin changes, a visual exam of the cervix, and then an exam to evaluate the size and shape of the uterus and  ovaries.  And after 55 years of age, a rectal exam is indicated to check for rectal cancers. We will also provide age appropriate advice regarding health maintenance, like when you should have certain vaccines, screening for sexually transmitted diseases, bone density testing, colonoscopy, mammograms, etc.   MAMMOGRAMS:  All women over 40 years old should have a yearly mammogram. Many facilities now offer a "3D" mammogram, which may cost around $50 extra out of pocket. If possible,  we recommend you accept the option to have the 3D mammogram performed.  It both reduces the number of women who will be called back for extra views which then turn out to be normal, and it is better than the routine mammogram at detecting truly abnormal areas.    COLONOSCOPY:  Colonoscopy to screen for colon cancer is recommended for all women at age 50.  We know, you hate the idea of the prep.  We agree, BUT, having colon cancer and not knowing it is worse!!  Colon cancer so often starts as a polyp that can be seen and removed at colonscopy, which can quite literally save your life!  And if your first colonoscopy is normal and you have no family history of colon cancer, most women don't have to have it again for 10 years.  Once every ten years, you can do something that may end up saving your life, right?  We will be happy to help you get it scheduled when you are ready.    Be sure to check your insurance coverage so you understand how much it will cost.  It may be covered as a preventative service at no cost, but you should check your particular policy.      Epidermal Cyst  An epidermal cyst is a sac made of skin tissue. The sac contains a substance called keratin. Keratin is a protein that is normally secreted through the hair follicles. When keratin becomes trapped in the top layer of skin (epidermis), it can form an epidermal cyst. Epidermal cysts can be found anywhere on your body. These cysts are usually harmless  (benign), and they may not cause symptoms unless they become infected. What are the causes? This condition may be caused by:  A blocked hair follicle.  A hair that curls and re-enters the skin instead of growing straight out of the skin (ingrown hair).  A blocked pore.  Irritated skin.  An injury to the skin.  Certain conditions that are passed along from parent to child (inherited).  Human papillomavirus (HPV).  Long-term (chronic) sun damage to the skin. What increases the risk? The following factors may make you more likely to develop an epidermal cyst:  Having acne.  Being overweight.  Being 26-50 years old. What are the signs or symptoms? The only symptom of this condition may be a small, painless lump underneath the skin. When an epidermal cyst ruptures, it may become infected. Symptoms may include:  Redness.  Inflammation.  Tenderness.  Warmth.  Fever.  Keratin draining from the cyst. Keratin is grayish-white, bad-smelling substance.  Pus draining from the cyst. How is this diagnosed? This condition is diagnosed with a physical exam.  In some cases, you may have a sample of tissue (biopsy) taken from your cyst to be examined under a microscope or tested for bacteria.  You may be referred to a health care provider who specializes in skin care (dermatologist). How is this treated? In many cases, epidermal cysts go away on their own without treatment. If a cyst becomes infected, treatment may include:  Opening and draining the cyst, done by a health care provider. After draining, minor surgery to remove the rest of the cyst may be done.  Antibiotic medicine.  Injections of medicines (steroids) that help to reduce inflammation.  Surgery to remove the cyst. Surgery may be done if the cyst: ? Becomes large. ? Bothers you. ? Has a chance of turning into cancer.  Do not try to open a cyst yourself. Follow these instructions at home:  Take  over-the-counter and prescription medicines only as told by your health care provider.  If you were prescribed an antibiotic medicine, take it it as told by your health care provider. Do not stop using the antibiotic even if you start to feel better.  Keep the area around your cyst clean and dry.  Wear loose, dry clothing.  Avoid touching your cyst.  Check your cyst every day for signs of infection. Check for: ? Redness, swelling, or pain. ? Fluid or blood. ? Warmth. ? Pus or a bad smell.  Keep all follow-up visits as told by your health care provider. This is important. How is this prevented?  Wear clean, dry, clothing.  Avoid wearing tight clothing.  Keep your skin clean and dry. Take showers or baths every day. Contact a health care provider if:  Your cyst develops symptoms of infection.  Your condition is not improving or is getting worse.  You develop a cyst that looks different from  other cysts you have had.  You have a fever. Get help right away if:  Redness spreads from the cyst into the surrounding area. Summary  An epidermal cyst is a sac made of skin tissue. These cysts are usually harmless (benign), and they may not cause symptoms unless they become infected.  If a cyst becomes infected, treatment may include surgery to open and drain the cyst, or to remove it. Treatment may also include medicines by mouth or through an injection.  Take over-the-counter and prescription medicines only as told by your health care provider. If you were prescribed an antibiotic medicine, take it as told by your health care provider. Do not stop using the antibiotic even if you start to feel better.  Contact a health care provider if your condition is not improving or is getting worse.  Keep all follow-up visits as told by your health care provider. This is important. This information is not intended to replace advice given to you by your health care provider. Make sure you  discuss any questions you have with your health care provider. Document Released: 08/08/2004 Document Revised: 12/29/2018 Document Reviewed: 03/21/2018 Elsevier Patient Education  2020 [employer].

## 2019-04-12 LAB — LIPID PANEL
Chol/HDL Ratio: 3.7 ratio (ref 0.0–4.4)
Cholesterol, Total: 203 mg/dL — ABNORMAL HIGH (ref 100–199)
HDL: 55 mg/dL (ref >39)
LDL Calculated: 133 mg/dL — ABNORMAL HIGH (ref 0–99)
Triglycerides: 77 mg/dL (ref 0–149)
VLDL Cholesterol Cal: 15 mg/dL (ref 5–40)

## 2019-04-12 LAB — HEMOGLOBIN A1C
Est. average glucose Bld gHb Est-mCnc: 105 mg/dL
Hgb A1c MFr Bld: 5.3 % (ref 4.8–5.6)

## 2019-04-12 LAB — IRON: Iron: 76 ug/dL (ref 27–159)

## 2019-04-12 LAB — FERRITIN: Ferritin: 49 ng/mL (ref 15–150)

## 2019-04-21 ENCOUNTER — Ambulatory Visit (AMBULATORY_SURGERY_CENTER): Payer: Self-pay | Admitting: *Deleted

## 2019-04-21 ENCOUNTER — Other Ambulatory Visit: Payer: Self-pay | Admitting: Rheumatology

## 2019-04-21 ENCOUNTER — Other Ambulatory Visit: Payer: Self-pay

## 2019-04-21 VITALS — Temp 97.7°F | Ht 66.0 in | Wt 163.2 lb

## 2019-04-21 DIAGNOSIS — Z86010 Personal history of colonic polyps: Secondary | ICD-10-CM

## 2019-04-21 DIAGNOSIS — M0579 Rheumatoid arthritis with rheumatoid factor of multiple sites without organ or systems involvement: Secondary | ICD-10-CM

## 2019-04-21 MED ORDER — NA SULFATE-K SULFATE-MG SULF 17.5-3.13-1.6 GM/177ML PO SOLN: ORAL | 0 refills | Status: DC

## 2019-04-21 NOTE — Telephone Encounter (Signed)
Last Visit: 02/09/19 Next visit: 07/12/19  Okay to refill per Dr. Estanislado Pandy

## 2019-04-21 NOTE — Telephone Encounter (Signed)
Last Visit: 02/09/19 Next visit: 07/12/19 Labs: 10/31/18 Glucose is elevated-136. RDW stable. Rest of lab work is WNL. PLQ Eye Exam: 09/28/18 WNL   Okay to refill per Dr. Estanislado Pandy

## 2019-04-21 NOTE — Progress Notes (Signed)
Patient is here in office for PV today. Patient denies any allergies to eggs or soy. Patient denies any problems with anesthesia/sedation. Patient denies any oxygen use at home. Patient denies taking any diet/weight loss medications or blood thinners. Suprep $15 off coupon given to pt. Pt is aware that care partner will wait in the car during procedure; if they feel like they will be too hot to wait in the car; they may wait in the lobby.  We want them to wear a mask (we do not have any that we can provide them), practice social distancing, and we will check their temperatures when they get here.  I did remind patient that their care partner needs to stay in the parking lot the entire time. Pt will wear mask into building.

## 2019-04-24 ENCOUNTER — Other Ambulatory Visit: Payer: Self-pay

## 2019-04-24 ENCOUNTER — Ambulatory Visit
Admission: RE | Admit: 2019-04-24 | Discharge: 2019-04-24 | Disposition: A | Discharge status: Home / Self Care | Payer: 59 | Source: Ambulatory Visit | Attending: Obstetrics and Gynecology | Admitting: Obstetrics and Gynecology

## 2019-04-24 DIAGNOSIS — Z780 Asymptomatic menopausal state: Secondary | ICD-10-CM

## 2019-05-02 ENCOUNTER — Encounter: Payer: Self-pay | Admitting: Gastroenterology

## 2019-05-04 ENCOUNTER — Telehealth: Payer: Self-pay | Admitting: Gastroenterology

## 2019-05-04 NOTE — Telephone Encounter (Signed)

## 2019-05-05 ENCOUNTER — Encounter: Payer: Self-pay | Admitting: Gastroenterology

## 2019-05-05 ENCOUNTER — Ambulatory Visit (AMBULATORY_SURGERY_CENTER): Payer: 59 | Admitting: Gastroenterology

## 2019-05-05 ENCOUNTER — Other Ambulatory Visit: Payer: Self-pay

## 2019-05-05 VITALS — BP 130/68 | HR 53 | Temp 98.9°F | Resp 13 | Ht 66.0 in | Wt 163.0 lb

## 2019-05-05 DIAGNOSIS — D125 Benign neoplasm of sigmoid colon: Secondary | ICD-10-CM | POA: Diagnosis not present

## 2019-05-05 DIAGNOSIS — Z86010 Personal history of colonic polyps: Secondary | ICD-10-CM | POA: Diagnosis not present

## 2019-05-05 DIAGNOSIS — K635 Polyp of colon: Secondary | ICD-10-CM | POA: Diagnosis not present

## 2019-05-05 HISTORY — PX: COLONOSCOPY: SHX174

## 2019-05-05 MED ORDER — SODIUM CHLORIDE 0.9 % IV SOLN: 500.0000 mL | Freq: Once | INTRAVENOUS | Status: DC

## 2019-05-05 NOTE — Progress Notes (Signed)
SP- Temp/Vitals

## 2019-05-05 NOTE — Patient Instructions (Addendum)
Information on polyps & hemorrhoids given to you today   Await pathology results on polyps removed   Repeat Colonoscopy in 3 years   YOU HAD AN ENDOSCOPIC PROCEDURE TODAY AT [facility]:   Refer to the procedure report that was given to you for any specific questions about what was found during the examination.  If the procedure report does not answer your questions, please call your gastroenterologist to clarify.  If you requested that your care partner not be given the details of your procedure findings, then the procedure report has been included in a sealed envelope for you to review at your convenience later.  YOU SHOULD EXPECT: Some feelings of bloating in the abdomen. Passage of more gas than usual.  Walking can help get rid of the air that was put into your GI tract during the procedure and reduce the bloating. If you had a lower endoscopy (such as a colonoscopy or flexible sigmoidoscopy) you may notice spotting of blood in your stool or on the toilet paper. If you underwent a bowel prep for your procedure, you may not have a normal bowel movement for a few days.  Please Note:  You might notice some irritation and congestion in your nose or some drainage.  This is from the oxygen used during your procedure.  There is no need for concern and it should clear up in a day or so.  SYMPTOMS TO REPORT IMMEDIATELY:   Following lower endoscopy (colonoscopy or flexible sigmoidoscopy):  Excessive amounts of blood in the stool  Significant tenderness or worsening of abdominal pains  Swelling of the abdomen that is new, acute  Fever of 100F or higher    For urgent or emergent issues, a gastroenterologist can be reached at any hour by calling [phone].   DIET:  We do recommend a small meal at first, but then you may proceed to your regular diet.  Drink plenty of fluids but you should avoid alcoholic beverages for 24 hours.  ACTIVITY:  You should plan to take it  easy for the rest of today and you should NOT DRIVE or use heavy machinery until tomorrow (because of the sedation medicines used during the test).    FOLLOW UP: Our staff will call the number listed on your records 48-72 hours following your procedure to check on you and address any questions or concerns that you may have regarding the information given to you following your procedure. If we do not reach you, we will leave a message.  We will attempt to reach you two times.  During this call, we will ask if you have developed any symptoms of COVID 19. If you develop any symptoms (ie: fever, flu-like symptoms, shortness of breath, cough etc.) before then, please call [phone].  If you test positive for Covid 19 in the 2 weeks post procedure, please call and report this information to [place].    If any biopsies were taken you will be contacted by phone or by letter within the next 1-3 weeks.  Please call us at [phone] if you have not heard about the biopsies in 3 weeks.    SIGNATURES/CONFIDENTIALITY: You and/or your care partner have signed paperwork which will be entered into your electronic medical record.  These signatures attest to the fact that that the information above on your After Visit Summary has been reviewed and is understood.  Full responsibility of the confidentiality of this discharge information lies with you and/or your  care-partner.

## 2019-05-05 NOTE — Progress Notes (Signed)
PT taken to PACU. Monitors in place. VSS. Report given to RN. 

## 2019-05-05 NOTE — Op Note (Signed)
Rebecca Fleming Patient Name: Rebecca Fleming Procedure Date: 05/05/2019 1:34 PM MRN: [id number] Endoscopist: Ladene Artist , MD Age: 53 Referring MD:  Date of Birth: 06-Feb-1966 Gender: Female Account #: [id number] Procedure:                Colonoscopy Indications:              Surveillance: Personal history of piecemeal removal                            of large sessile adenoma on last colonoscopy (less                            than 1 year ago) Medicines:                Monitored Anesthesia Care Procedure:                Pre-Anesthesia Assessment:                           - Prior to the procedure, a History and Physical                            was performed, and patient medications and                            allergies were reviewed. The patient's tolerance of                            previous anesthesia was also reviewed. The risks                            and benefits of the procedure and the sedation                            options and risks were discussed with the patient.                            All questions were answered, and informed consent                            was obtained. Prior Anticoagulants: The patient has                            taken no previous anticoagulant or antiplatelet                            agents. ASA Grade Assessment: II - A patient with                            mild systemic disease. After reviewing the risks                            and benefits, the patient was deemed in  satisfactory condition to undergo the procedure.                           After obtaining informed consent, the colonoscope                            was passed under direct vision. Throughout the                            procedure, the patient's blood pressure, pulse, and                            oxygen saturations were monitored continuously. The                            Colonoscope was introduced through the  anus and                            advanced to the the cecum, identified by                            appendiceal orifice and ileocecal valve. The                            ileocecal valve, appendiceal orifice, and rectum                            were photographed. The quality of the bowel                            preparation was good. The colonoscopy was performed                            without difficulty. The patient tolerated the                            procedure well. Scope In: 1:44:50 PM Scope Out: 2:07:55 PM Scope Withdrawal Time: 0 hours 17 minutes 39 seconds  Total Procedure Duration: 0 hours 23 minutes 5 seconds  Findings:                 The perianal and digital rectal examinations were                            normal.                           A tattoo was seen in the sigmoid colon. A                            post-polypectomy scar was found at the tattoo site.                            There was no evidence of residual polyp tissue.  A 6 mm polyp was found in the sigmoid colon near                            the tattoos but not near the polypectomy scar. The                            polyp was sessile. The polyp was removed with a                            cold snare. Resection and retrieval were complete.                           Six sessile polyps were found in the sigmoid colon                            near the tattoos but not near the polypectomy scar.                            The polyps were 2 to 3 mm in size. These polyps                            were removed with a cold biopsy forceps. Resection                            and retrieval were complete.                           Internal hemorrhoids were found during                            retroflexion. The hemorrhoids were small and Grade                            I (internal hemorrhoids that do not prolapse).                           The exam was otherwise  without abnormality on                            direct and retroflexion views. Complications:            No immediate complications. Estimated blood loss:                            None. Estimated Blood Loss:     Estimated blood loss: none. Impression:               - A tattoo was seen in the sigmoid colon. A                            post-polypectomy scar was found at the tattoo site.  There was no evidence of residual polyp tissue.                           - One 6 mm polyp in the sigmoid colon, removed with                            a cold snare. Resected and retrieved.                           - Six 2 to 3 mm polyps in the sigmoid colon,                            removed with a cold biopsy forceps. Resected and                            retrieved.                           - Internal hemorrhoids.                           - The examination was otherwise normal on direct                            and retroflexion views. Recommendation:           - Repeat colonoscopy in 3 years for surveillance.                           - Patient has a contact number available for                            emergencies. The signs and symptoms of potential                            delayed complications were discussed with the                            patient. Return to normal activities tomorrow.                            Written discharge instructions were provided to the                            patient.                           - Resume previous diet.                           - Continue present medications.                           - Await pathology results. Ladene Artist, MD 05/05/2019 2:13:45 PM This report has been signed electronically.

## 2019-05-05 NOTE — Progress Notes (Signed)
Pt's states no medical or surgical changes since previsit or office visit. 

## 2019-05-05 NOTE — Progress Notes (Signed)
Called to room to assist during endoscopic procedure.  Patient ID and intended procedure confirmed with present staff. Received instructions for my participation in the procedure from the performing physician.  

## 2019-05-09 ENCOUNTER — Telehealth: Payer: Self-pay

## 2019-05-09 NOTE — Telephone Encounter (Signed)
  Follow up Call-  Call back number 05/05/2019 03/30/2018  Post procedure Call Back phone  # [phone]  Permission to leave phone message Yes Yes  Some recent data might be hidden     Patient questions:  Do you have a fever, pain , or abdominal swelling? No. Pain Score  0 *  Have you tolerated food without any problems? Yes.    Have you been able to return to your normal activities? Yes.    Do you have any questions about your discharge instructions: Diet   No. Medications  No. Follow up visit  No.  Do you have questions or concerns about your Care? No.  Actions: * If pain score is 4 or above: No action needed, pain <4. 1. Have you developed a fever since your procedure? no  2.   Have you had an respiratory symptoms (SOB or cough) since your procedure? no  3.   Have you tested positive for COVID 19 since your procedure no  4.   Have you had any family members/close contacts diagnosed with the COVID 19 since your procedure?  no   If yes to any of these questions please route to Joylene John, RN and Alphonsa Gin, Therapist, sports.

## 2019-05-29 ENCOUNTER — Encounter: Payer: Self-pay | Admitting: Gastroenterology

## 2019-06-28 NOTE — Progress Notes (Signed)
Office Visit Note  Patient: Rebecca Fleming             Date of Birth: 11-Jan-1966           MRN: [id number]             PCP: Nunzio Cobbs, MD Referring: Aundria Rud* Visit Date: 07/12/2019 Occupation: @GUAROCC @  Subjective:  Pain in both hands    History of Present Illness: DARCELLE SALADIN is a 53 y.o. female with history of seropositive rheumatoid arthritis and osteoarthritis.  Patient is taking Plaquenil 200 mg 1 tablet by mouth twice daily.  She has not missed any doses of Plaquenil.  She is been tolerating without any side effects.  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  She continues to have some residual discomfort in the right third MCP, right wrist, and left second MCP joint.  She has not taken any prednisone recently.  She has been experiencing increased neck pain and stiffness.  She denies any symptoms of radiculopathy.  She continues to have chronic lower back pain due to thoracolumbar scoliosis.   Activities of Daily Living:  Patient reports morning stiffness for 15 minutes.   Patient Denies nocturnal pain.  Difficulty dressing/grooming: Denies Difficulty climbing stairs: Denies Difficulty getting out of chair: Denies Difficulty using hands for taps, buttons, cutlery, and/or writing: Reports  Review of Systems  Constitutional: Negative for fatigue.  HENT: Negative for mouth sores, mouth dryness and nose dryness.   Eyes: Negative for itching and dryness.  Respiratory: Negative for shortness of breath, wheezing and difficulty breathing.   Cardiovascular: Negative for chest pain and palpitations.  Gastrointestinal: Negative for blood in stool, constipation and diarrhea.  Endocrine: Negative for increased urination.  Genitourinary: Negative for difficulty urinating and painful urination.  Musculoskeletal: Positive for arthralgias, joint pain, joint swelling  and morning stiffness.  Skin: Negative for rash and hair loss.  Allergic/Immunologic: Negative for susceptible to infections.  Neurological: Negative for dizziness, light-headedness, numbness, headaches, memory loss and weakness.  Hematological: Positive for bruising/bleeding tendency.  Psychiatric/Behavioral: Negative for confusion.    PMFS History:  Patient Active Problem List   Diagnosis Date Noted  . Primary osteoarthritis of both knees 09/01/2017  . Scoliosis 09/01/2017  . High risk medication use 11/11/2016  . Primary osteoarthritis of both hands 11/11/2016  . Lateral epicondylitis, right elbow 11/11/2016  . Myopia with astigmatism and presbyopia, bilateral 09/09/2016  . 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 Status post laparoscopic assisted vaginal hysterectomy (LAVH) 01/15/2015  . Breast cancer, left breast ([place]) 11/07/2014  . Postmenopausal bleeding 07/13/2013  . Rheumatoid arthritis ([place]) 01/11/2012  . Psoriasis 01/11/2012    Past Medical History:  Diagnosis Date  . Abnormal uterine bleeding   . Allergy   . Anemia   . Arthritis   . Asthma    exercised induced asthma - rarely uses inhaler;none since Chemo.  . Blood transfusion without reported diagnosis 1976   1976  following surgery at [facility]   . Cancer ([place]) 08/2008   left breast--has bilateral mastectomy  . Complication of anesthesia    pt states" gets shakes" with general  . Cyst near tailbone   . Elevated LDL cholesterol level   . Environmental and seasonal allergies   . Fibroid   . Headache    sleeps , no meds  . Heart murmur  never has caused any problems  . Helicobacter pylori infection   . Hemorrhoids    hx of  . History of breast cancer   . History of shingles    03/07/18  . Hypotension    hx of  . Post-operative nausea and vomiting   . RA (rheumatoid arthritis) ([place])   . RA (rheumatoid arthritis) ([place])   . Scoliosis    tx with robaxin prn - rarely uses  . SVD  (spontaneous vaginal delivery) 1996   x 1    Family History  Problem Relation Age of Onset  . Breast cancer Mother   . Diabetes Mother   . Hypertension Mother   . Stroke Mother   . Irritable bowel syndrome Mother   . Kidney disease Mother   . Heart failure Father   . Hypertension Father   . Colon cancer Neg Hx   . Colon polyps Neg Hx   . Esophageal cancer Neg Hx   . Pancreatic cancer Neg Hx   . Rectal cancer Neg Hx   . Stomach cancer Neg Hx    Past Surgical History:  Procedure Laterality Date  . ABDOMINAL HYSTERECTOMY    . APPENDECTOMY    . BREAST LUMPECTOMY  05/17/2008   x 2 re excise for margin - Dr Margot Chimes, bilateral mastectomy 08/2008  . BREAST LUMPECTOMY W/ NEEDLE LOCALIZATION  04/23/2008   w [facility] Dr Margot Chimes  . CESAREAN SECTION  01/01/2004   Dr Quincy Simmonds  . COLONOSCOPY  03/2018  . COLONOSCOPY  05/05/2019  . ENDOMETRIAL BIOPSY  06/2013, 2015   x 2  . EYE SURGERY  2003   bilateral lasik  . FOOT SURGERY    . HYSTEROSCOPY W/D&C  07/21/2011   Procedure: DILATATION AND CURETTAGE (D&C) /HYSTEROSCOPY;  Surgeon: Arloa Koh;  Location: [facility];  Service: Gynecology;  Laterality: N/A;  with Ultrasound Guidance  . INSERTION OF TISSUE EXPANDER AFTER MASTECTOMY  09/11/2008   Dr Harlow Mares  . KNEE ARTHROSCOPY W/ MENISCAL REPAIR Right 2010   --Dr. Telford Nab  . LAPAROSCOPIC ASSISTED VAGINAL HYSTERECTOMY N/A 01/15/2015   Procedure: LAPAROSCOPIC ASSISTED VAGINAL HYSTERECTOMY;  Surgeon: Nunzio Cobbs, MD;  Location: [facility];  Service: Gynecology;  Laterality: N/A;  . left foot surgery  2004  . left rotator cuff repair  01/2006  . MASTECTOMY  09/11/2008   bilateral - Dr Margot Chimes  . PORT-A-CATH REMOVAL  09/09/2009   Dr Margot Chimes  . PORTACATH PLACEMENT  05/17/2008   Dr Cori Razor rounds of chemo  . REMOVAL OF TISSUE EXPANDER AND PLACEMENT OF IMPLANT  12/2008  . RHINOPLASTY    . SALPINGOOPHORECTOMY Bilateral 01/15/2015   Procedure: BILATERAL SALPINGO OOPHORECTOMY;  Surgeon: Nunzio Cobbs, MD;  Location: [facility];  Service: Gynecology;  Laterality: Bilateral;  . UMBILICAL HERNIA REPAIR  09/09/2009   Dr Margot Chimes  . UPPER GI ENDOSCOPY  03/2018  . WISDOM TOOTH EXTRACTION     Social History   Social History Narrative  . Not on file   Immunization History  Administered Date(s) Administered  . Pneumococcal Polysaccharide-23 01/16/2015  . Tdap 09/21/2010     Objective: Vital Signs: BP (!) 97/57 (BP Location: Right Arm, Patient Position: Sitting, Cuff Size: Normal)   Pulse (!) 57   Resp 13   Ht 5\' 6"  (1.676 m)   Wt 163 lb (73.9 kg)   LMP 11/12/2014   BMI 26.31 kg/m    Physical Exam Vitals signs and nursing note reviewed.  Constitutional:  Appearance: She is well-developed.  HENT:     Head: Normocephalic and atraumatic.  Eyes:     Conjunctiva/sclera: Conjunctivae normal.  Neck:     Musculoskeletal: Normal range of motion.  Cardiovascular:     Rate and Rhythm: Normal rate and regular rhythm.     Heart sounds: Normal heart sounds.  Pulmonary:     Effort: Pulmonary effort is normal.     Breath sounds: Normal breath sounds.  Abdominal:     General: Bowel sounds are normal.     Palpations: Abdomen is soft.  Lymphadenopathy:     Cervical: No cervical adenopathy.  Skin:    General: Skin is warm and dry.     Capillary Refill: Capillary refill takes less than 2 seconds.  Neurological:     Mental Status: She is alert and oriented to person, place, and time.  Psychiatric:        Behavior: Behavior normal.      Musculoskeletal Exam: C-spine slightly limited range of motion with lateral rotation.  Thoracic and lumbar spine good range of motion.  Thoracolumbar scoliosis noted.  Shoulder joints, elbows, wrist joints, MCPs, PIPs have good range of motion.  She has tenderness and synovitis of the right wrist joint.  Synovitis of the right third MCP and left second MCP joint noted.  She has complete fist formation bilaterally.  Hip joints, knee joints, ankle  joints, MTPs, PIPs and DIPs good range of motion no synovitis.  No warmth or effusion of bilateral knee joints.  She has bilateral knee crepitus.  No tenderness or inflammation of ankle joints noted.  She has tenderness of the right fourth MTP joint.  CDAI Exam: CDAI Score: 6.6  Patient Global: 3 mm; Provider Global: 3 mm Swollen: 3 ; Tender: 4  Joint Exam      Right  Left  Wrist  Swollen Tender     MCP 2     Swollen Tender  MCP 3  Swollen Tender     MTP 4   Tender        Investigation: No additional findings.  Imaging: No results found.  Recent Labs: Lab Results  Component Value Date   WBC 6.3 07/03/2019   HGB 14.6 07/03/2019   PLT 215 07/03/2019   NA 142 07/03/2019   K 4.6 07/03/2019   CL 104 07/03/2019   CO2 31 07/03/2019   GLUCOSE 77 07/03/2019   BUN 12 07/03/2019   CREATININE 0.93 07/03/2019   BILITOT 0.6 07/03/2019   ALKPHOS 66 03/31/2017   AST 19 07/03/2019   ALT 16 07/03/2019   PROT 6.4 07/03/2019   ALBUMIN 4.1 03/31/2017   CALCIUM 9.9 07/03/2019   GFRAA 81 07/03/2019    Speciality Comments: PLQ Eye Exam: 09/28/18 WNL @ [facility]   Procedures:  No procedures performed Allergies: Latex, Other, and Percocet [oxycodone-acetaminophen]   Assessment / Plan:     Visit Diagnoses: Rheumatoid arthritis involving multiple sites with positive rheumatoid factor (HCC) -  +RF, +CCP: She has tenderness and synovitis of the right wrist joint, right third MCP, and left second MCP joint.  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  She continues to take Plaquenil 200 mg 1 tablet by mouth twice daily.  She has not missed any doses recently and has been tolerating it without any side effects.  She will continue taking Plaquenil as prescribed.  She does not need any refills at this  time.  A prednisone taper starting at 20 mg tapering by 5 mg every 4 days was sent to the pharmacy today.   We discussed adding on Arava to her current treatment regimen but she declined.  She could not tolerate taking methotrexate in the past.  She is advised to notify us if her joint pain and joint swelling is persistent.  She will follow-up in the office in 3 months.  High risk medication use - Plaquenil 200 mg 1 tablet twice daily.  Last Plaquenil eye exam normal on 09/28/2018.  Most recent CBC/CMP within normal limits on 07/03/2019.  Primary osteoarthritis of both hands: She has PIP and DIP synovial thickening consistent osteoarthritis of both hands.  Joint protection and muscle strengthening were discussed.  Primary osteoarthritis of both knees: She has good range of motion of bilateral knee joints.  No warmth or effusion was noted.  She has bilateral knee crepitus.  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  Chondromalacia of left patella: She has left knee crepitus.   Psoriasis: She has no psoriasis at this time.   Lateral epicondylitis, right elbow - Resolved.  Scoliosis, unspecified scoliosis type, unspecified spinal region:  She has thoracolumbar scoliosis.  She experiences chronic discomfort.   Other medical conditions are listed as follows:  History of anemia  History of asthma  History of breast cancer  Heart murmur  Morton's neuroma of right foot - resolved.  Orders: No orders of the defined types were placed in this encounter.  Meds ordered this encounter  Medications  . predniSONE (DELTASONE) 5 MG tablet    Sig: Take 4 tablets by mouth once daily x4 days, 3 tablets by mouth daily x4 days, 2 tablets by mouth daily x4 days, 1 tablet by mouth daily x4 days.    Dispense:  40 tablet    Refill:  0    Face-to-face time spent with patient was 30 minutes. Greater than 50% of time was spent in counseling and coordination of care.  Follow-Up Instructions: Return in about 5 months (around 12/10/2019) for Rheumatoid arthritis, Osteoarthritis.   Ofilia Neas,  PA-C   I examined and evaluated the patient with Hazel Sams PA.  Patient is having a flare today with increased pain and swelling in her joints.  It is related to the painting activity she did at her work.  We discussed more aggressive therapy but she declined.  She has intolerance to methotrexate in the past.  I discussed possible use of Arava but she would like to hold off.  Per her request she prescribed prednisone taper was given.  Side effects of prednisone were discussed.  The plan of care was discussed as noted above.  Bo Merino, MD Note - This record has been created using Editor, commissioning.  Chart creation errors have been sought, but may not always  have been located. Such creation errors do not reflect on  the standard of medical care.

## 2019-07-03 ENCOUNTER — Other Ambulatory Visit: Payer: Self-pay

## 2019-07-03 DIAGNOSIS — Z79899 Other long term (current) drug therapy: Secondary | ICD-10-CM

## 2019-07-04 LAB — COMPLETE METABOLIC PANEL WITH GFR
AG Ratio: 1.8 (calc) (ref 1.0–2.5)
ALT: 16 U/L (ref 6–29)
AST: 19 U/L (ref 10–35)
Albumin: 4.1 g/dL (ref 3.6–5.1)
Alkaline phosphatase (APISO): 58 U/L (ref 37–153)
BUN: 12 mg/dL (ref 7–25)
CO2: 31 mmol/L (ref 20–32)
Calcium: 9.9 mg/dL (ref 8.6–10.4)
Chloride: 104 mmol/L (ref 98–110)
Creat: 0.93 mg/dL (ref 0.50–1.05)
GFR, Est African American: 81 mL/min/{1.73_m2} (ref >=60)
GFR, Est Non African American: 70 mL/min/{1.73_m2} (ref >=60)
Globulin: 2.3 g/dL (calc) (ref 1.9–3.7)
Glucose, Bld: 77 mg/dL (ref 65–99)
Potassium: 4.6 mmol/L (ref 3.5–5.3)
Sodium: 142 mmol/L (ref 135–146)
Total Bilirubin: 0.6 mg/dL (ref 0.2–1.2)
Total Protein: 6.4 g/dL (ref 6.1–8.1)

## 2019-07-04 LAB — CBC WITH DIFFERENTIAL/PLATELET
Absolute Monocytes: 416 cells/uL (ref 200–950)
Basophils Absolute: 19 cells/uL (ref 0–200)
Basophils Relative: 0.3 %
Eosinophils Absolute: 214 cells/uL (ref 15–500)
Eosinophils Relative: 3.4 %
HCT: 43.1 % (ref 35.0–45.0)
Hemoglobin: 14.6 g/dL (ref 11.7–15.5)
Lymphs Abs: 1682 cells/uL (ref 850–3900)
MCH: 31.4 pg (ref 27.0–33.0)
MCHC: 33.9 g/dL (ref 32.0–36.0)
MCV: 92.7 fL (ref 80.0–100.0)
MPV: 12 fL (ref 7.5–12.5)
Monocytes Relative: 6.6 %
Neutro Abs: 3969 cells/uL (ref 1500–7800)
Neutrophils Relative %: 63 %
Platelets: 215 10*3/uL (ref 140–400)
RBC: 4.65 10*6/uL (ref 3.80–5.10)
RDW: 12.8 % (ref 11.0–15.0)
Total Lymphocyte: 26.7 %
WBC: 6.3 10*3/uL (ref 3.8–10.8)

## 2019-07-12 ENCOUNTER — Ambulatory Visit: Payer: 59 | Admitting: Rheumatology

## 2019-07-12 ENCOUNTER — Other Ambulatory Visit: Payer: Self-pay

## 2019-07-12 ENCOUNTER — Encounter: Payer: Self-pay | Admitting: Rheumatology

## 2019-07-12 VITALS — BP 97/57 | HR 57 | Resp 13 | Ht 66.0 in | Wt 163.0 lb

## 2019-07-12 DIAGNOSIS — M19042 Primary osteoarthritis, left hand: Secondary | ICD-10-CM

## 2019-07-12 DIAGNOSIS — Z853 Personal history of malignant neoplasm of breast: Secondary | ICD-10-CM

## 2019-07-12 DIAGNOSIS — Z862 Personal history of diseases of the blood and blood-forming organs and certain disorders involving the immune mechanism: Secondary | ICD-10-CM

## 2019-07-12 DIAGNOSIS — M19041 Primary osteoarthritis, right hand: Secondary | ICD-10-CM

## 2019-07-12 DIAGNOSIS — M170 Bilateral primary osteoarthritis of knee: Secondary | ICD-10-CM | POA: Diagnosis not present

## 2019-07-12 DIAGNOSIS — R011 Cardiac murmur, unspecified: Secondary | ICD-10-CM

## 2019-07-12 DIAGNOSIS — G5761 Lesion of plantar nerve, right lower limb: Secondary | ICD-10-CM

## 2019-07-12 DIAGNOSIS — Z79899 Other long term (current) drug therapy: Secondary | ICD-10-CM | POA: Diagnosis not present

## 2019-07-12 DIAGNOSIS — Z8709 Personal history of other diseases of the respiratory system: Secondary | ICD-10-CM

## 2019-07-12 DIAGNOSIS — M419 Scoliosis, unspecified: Secondary | ICD-10-CM

## 2019-07-12 DIAGNOSIS — M7711 Lateral epicondylitis, right elbow: Secondary | ICD-10-CM

## 2019-07-12 DIAGNOSIS — M2242 Chondromalacia patellae, left knee: Secondary | ICD-10-CM

## 2019-07-12 DIAGNOSIS — M0579 Rheumatoid arthritis with rheumatoid factor of multiple sites without organ or systems involvement: Secondary | ICD-10-CM | POA: Diagnosis not present

## 2019-07-12 DIAGNOSIS — L409 Psoriasis, unspecified: Secondary | ICD-10-CM

## 2019-07-12 MED ORDER — PREDNISONE 5 MG PO TABS: ORAL_TABLET | ORAL | 0 refills | Status: DC

## 2019-07-16 ENCOUNTER — Other Ambulatory Visit: Payer: Self-pay | Admitting: Rheumatology

## 2019-07-16 DIAGNOSIS — M0579 Rheumatoid arthritis with rheumatoid factor of multiple sites without organ or systems involvement: Secondary | ICD-10-CM

## 2019-07-17 NOTE — Telephone Encounter (Signed)
Last Visit: 07/12/19  Next Visit: 10/11/19 Labs: 07/03/19 WNL PLQ Eye Exam: 09/28/18 WNL   Okay to refill per Dr. Estanislado Pandy

## 2019-10-05 ENCOUNTER — Ambulatory Visit: Payer: 59 | Attending: Internal Medicine

## 2019-10-05 DIAGNOSIS — Z23 Encounter for immunization: Secondary | ICD-10-CM | POA: Insufficient documentation

## 2019-10-05 NOTE — Progress Notes (Signed)
   Covid-19 Vaccination Clinic  Name:  Rebecca Fleming    MRN: [id number] DOB: 1966-04-11  10/05/2019  [name] was observed post Covid-19 immunization for 15 minutes without incidence. She was provided with Vaccine Information Sheet and instruction to access the V-Safe system.   [name] was instructed to call 911 with any severe reactions post vaccine: Marland Kitchen Difficulty breathing  . Swelling of your face and throat  . A fast heartbeat  . A bad rash all over your body  . Dizziness and weakness    Immunizations Administered    Name Date Dose VIS Date Route   Pfizer COVID-19 Vaccine 10/05/2019 12:45 PM 0.3 mL 09/01/2019 Intramuscular   Manufacturer: [place]   Lot: [id number]   [place]: [id number]

## 2019-10-08 ENCOUNTER — Other Ambulatory Visit: Payer: Self-pay | Admitting: Rheumatology

## 2019-10-08 DIAGNOSIS — M0579 Rheumatoid arthritis with rheumatoid factor of multiple sites without organ or systems involvement: Secondary | ICD-10-CM

## 2019-10-09 NOTE — Telephone Encounter (Signed)
Last Visit: 07/12/19  Next Visit: 11/08/19 Labs: 07/03/19 WNL PLQ Eye Exam: 09/28/19 WNL   Okay to refill per Dr. Estanislado Pandy

## 2019-10-11 ENCOUNTER — Ambulatory Visit: Payer: 59 | Admitting: Rheumatology

## 2019-10-25 ENCOUNTER — Ambulatory Visit: Payer: 59 | Attending: Internal Medicine

## 2019-10-25 DIAGNOSIS — Z23 Encounter for immunization: Secondary | ICD-10-CM

## 2019-10-25 NOTE — Progress Notes (Signed)
   Covid-19 Vaccination Clinic  Name:  Rebecca Fleming    MRN: [id number] DOB: January 16, 1966  10/25/2019  [name] was observed post Covid-19 immunization for 15 minutes without incidence. She was provided with Vaccine Information Sheet and instruction to access the V-Safe system.   [name] was instructed to call 911 with any severe reactions post vaccine: Marland Kitchen Difficulty breathing  . Swelling of your face and throat  . A fast heartbeat  . A bad rash all over your body  . Dizziness and weakness    Immunizations Administered    Name Date Dose VIS Date Route   Pfizer COVID-19 Vaccine 10/25/2019  9:33 AM 0.3 mL 09/01/2019 Intramuscular   Manufacturer: [place]   Lot: [id number]   [place]: [id number]

## 2019-11-02 NOTE — Progress Notes (Signed)
Office Visit Note  Patient: Rebecca Fleming             Date of Birth: [birth year]           MRN: [id number]             PCP: Rebecca Cobbs, MD Referring: Rebecca Fleming* Visit Date: 11/08/2019 Occupation: @GUAROCC @  Subjective:  Medication monitoring.   History of Present Illness: Rebecca Fleming is a 54 y.o. female with history of rheumatoid arthritis and osteoarthritis.  She states she has been doing well with occasional stiffness in her joints with certain activities.  She denies any joint swelling.  Her last flare was in October which required prednisone taper.  She had eye examination in January which was within normal limits.  She has not had any flare of psoriasis.  The right epicondylitis and Morton's neuroma has resolved.  She had recent bone density.  Activities of Daily Living:  Patient reports morning stiffness for 15 minutes.   Patient Denies nocturnal pain.  Difficulty dressing/grooming: Denies Difficulty climbing stairs: Denies Difficulty getting out of chair: Denies Difficulty using hands for taps, buttons, cutlery, and/or writing: Reports  Review of Systems  Constitutional: Negative for fatigue, night sweats, weight gain and weight loss.  HENT: Negative for mouth sores, trouble swallowing, trouble swallowing, mouth dryness and nose dryness.   Eyes: Negative for pain, redness, itching, visual disturbance and dryness.  Respiratory: Negative for cough, shortness of breath, wheezing and difficulty breathing.   Cardiovascular: Negative for chest pain, palpitations, hypertension, irregular heartbeat and swelling in legs/feet.  Gastrointestinal: Negative for blood in stool, constipation and diarrhea.  Endocrine: Negative for increased urination.  Genitourinary: Negative for difficulty urinating, painful urination and vaginal dryness.  Musculoskeletal: Positive for arthralgias, joint pain and morning stiffness. Negative for joint swelling, myalgias,  muscle weakness, muscle tenderness and myalgias.  Skin: Negative for color change, rash, hair loss, skin tightness, ulcers and sensitivity to sunlight.  Allergic/Immunologic: Negative for susceptible to infections.  Neurological: Negative for dizziness, headaches, memory loss, night sweats and weakness.  Hematological: Negative for bruising/bleeding tendency and swollen glands.  Psychiatric/Behavioral: Negative for depressed mood, confusion and sleep disturbance. The patient is not nervous/anxious.     PMFS History:  Patient Active Problem List   Diagnosis Date Noted  . Primary osteoarthritis of both knees 09/01/2017  . Scoliosis 09/01/2017  . High risk medication use 11/11/2016  . Primary osteoarthritis of both hands 11/11/2016  . Lateral epicondylitis, right elbow 11/11/2016  . Myopia with astigmatism and presbyopia, bilateral 09/09/2016  . 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 Status post laparoscopic assisted vaginal hysterectomy (LAVH) 01/15/2015  . Breast cancer, left breast ([place]) 11/07/2014  . Postmenopausal bleeding 07/13/2013  . Rheumatoid arthritis ([place]) 01/11/2012  . Psoriasis 01/11/2012    Past Medical History:  Diagnosis Date  . Abnormal uterine bleeding   . Allergy   . Anemia   . Arthritis   . Asthma    exercised induced asthma - rarely uses inhaler;none since Chemo.  . Blood transfusion without reported diagnosis 1976   1976  following surgery at [facility]   . Cancer ([place]) 08/2008   left breast--has bilateral mastectomy  . Complication of anesthesia    pt states" gets shakes" with general  . Cyst near tailbone   . Elevated LDL cholesterol level   . Environmental and seasonal allergies   . Fibroid   . Headache    sleeps ,  no meds  . Heart murmur    never has caused any problems  . Helicobacter pylori infection   . Hemorrhoids    hx of  . History of breast cancer   . History of shingles    03/07/18  . Hypotension    hx of  .  Post-operative nausea and vomiting   . RA (rheumatoid arthritis) ([place])   . RA (rheumatoid arthritis) ([place])   . Scoliosis    tx with robaxin prn - rarely uses  . SVD (spontaneous vaginal delivery) 1996   x 1    Family History  Problem Relation Age of Onset  . Breast cancer Mother   . Diabetes Mother   . Hypertension Mother   . Stroke Mother   . Irritable bowel syndrome Mother   . Kidney disease Mother   . Heart failure Father   . Hypertension Father   . Colon cancer Neg Hx   . Colon polyps Neg Hx   . Esophageal cancer Neg Hx   . Pancreatic cancer Neg Hx   . Rectal cancer Neg Hx   . Stomach cancer Neg Hx    Past Surgical History:  Procedure Laterality Date  . ABDOMINAL HYSTERECTOMY    . APPENDECTOMY    . BREAST LUMPECTOMY  05/17/2008   x 2 re excise for margin - Dr Margot Chimes, bilateral mastectomy 08/2008  . BREAST LUMPECTOMY W/ NEEDLE LOCALIZATION  04/23/2008   w SLN Dr Margot Chimes  . CESAREAN SECTION  01/01/2004   Dr Quincy Simmonds  . COLONOSCOPY  03/2018  . COLONOSCOPY  05/05/2019  . ENDOMETRIAL BIOPSY  06/2013, 2015   x 2  . EYE SURGERY  2003   bilateral lasik  . FOOT SURGERY    . HYSTEROSCOPY WITH D & C  07/21/2011   Procedure: DILATATION AND CURETTAGE (D&C) /HYSTEROSCOPY;  Surgeon: Arloa Koh;  Location: [facility];  Service: Gynecology;  Laterality: N/A;  with Ultrasound Guidance  . INSERTION OF TISSUE EXPANDER AFTER MASTECTOMY  09/11/2008   Dr Harlow Mares  . KNEE ARTHROSCOPY W/ MENISCAL REPAIR Right 2010   --Dr. Telford Nab  . LAPAROSCOPIC ASSISTED VAGINAL HYSTERECTOMY N/A 01/15/2015   Procedure: LAPAROSCOPIC ASSISTED VAGINAL HYSTERECTOMY;  Surgeon: Rebecca Cobbs, MD;  Location: [facility];  Service: Gynecology;  Laterality: N/A;  . left foot surgery  2004  . left rotator cuff repair  01/2006  . MASTECTOMY  09/11/2008   bilateral - Dr Margot Chimes  . PORT-A-CATH REMOVAL  09/09/2009   Dr Margot Chimes  . PORTACATH PLACEMENT  05/17/2008   Dr Cori Razor rounds of chemo  . REMOVAL OF TISSUE  EXPANDER AND PLACEMENT OF IMPLANT  12/2008  . RHINOPLASTY    . SALPINGOOPHORECTOMY Bilateral 01/15/2015   Procedure: BILATERAL SALPINGO OOPHORECTOMY;  Surgeon: Rebecca Cobbs, MD;  Location: [facility];  Service: Gynecology;  Laterality: Bilateral;  . UMBILICAL HERNIA REPAIR  09/09/2009   Dr Margot Chimes  . UPPER GI ENDOSCOPY  03/2018  . WISDOM TOOTH EXTRACTION     Social History   Social History Narrative  . Not on file   Immunization History  Administered Date(s) Administered  . PFIZER SARS-COV-2 Vaccination 10/05/2019, 10/25/2019  . Pneumococcal Polysaccharide-23 01/16/2015  . Tdap 09/21/2010     Objective: Vital Signs: BP 110/62 (BP Location: Right Arm, Patient Position: Sitting, Cuff Size: Normal)   Pulse 62   Resp 13   Ht 5\' 6"  (1.676 m)   Wt 164 lb 12.8 oz (74.8 kg)   LMP 11/12/2014  BMI 26.60 kg/m    Physical Exam Vitals and nursing note reviewed.  Constitutional:      Appearance: She is well-developed.  HENT:     Head: Normocephalic and atraumatic.  Eyes:     Conjunctiva/sclera: Conjunctivae normal.  Cardiovascular:     Rate and Rhythm: Normal rate and regular rhythm.     Heart sounds: Normal heart sounds.  Pulmonary:     Effort: Pulmonary effort is normal.     Breath sounds: Normal breath sounds.  Abdominal:     General: Bowel sounds are normal.     Palpations: Abdomen is soft.  Musculoskeletal:     Cervical back: Normal range of motion.  Lymphadenopathy:     Cervical: No cervical adenopathy.  Skin:    General: Skin is warm and dry.     Capillary Refill: Capillary refill takes less than 2 seconds.  Neurological:     Mental Status: She is alert and oriented to person, place, and time.  Psychiatric:        Behavior: Behavior normal.      Musculoskeletal Exam: C-spine was in good range of motion.  She has severe thoracolumbar scoliosis.  Shoulder joints, elbow joints, wrist joints, MCPs with good range of motion with no synovitis.  She has some DIP  thickening consistent with osteoarthritis.  Hip joints, knee joints, ankles with good range of motion with no synovitis. CDAI Exam: CDAI Score: 0.2  Patient Global: 1 mm; Provider Global: 1 mm Swollen: 0 ; Tender: 0  Joint Exam 11/08/2019   No joint exam has been documented for this visit   There is currently no information documented on the homunculus. Go to the Rheumatology activity and complete the homunculus joint exam.  Investigation: No additional findings.  Imaging: No results found.  Recent Labs: Lab Results  Component Value Date   WBC 6.3 07/03/2019   HGB 14.6 07/03/2019   PLT 215 07/03/2019   NA 142 07/03/2019   K 4.6 07/03/2019   CL 104 07/03/2019   CO2 31 07/03/2019   GLUCOSE 77 07/03/2019   BUN 12 07/03/2019   CREATININE 0.93 07/03/2019   BILITOT 0.6 07/03/2019   ALKPHOS 66 03/31/2017   AST 19 07/03/2019   ALT 16 07/03/2019   PROT 6.4 07/03/2019   ALBUMIN 4.1 03/31/2017   CALCIUM 9.9 07/03/2019   GFRAA 81 07/03/2019    Speciality Comments: PLQ Eye Exam: 09/28/2019 WNL @ [facility]. Follow up in 1 year. (in [place])  Procedures:  No procedures performed Allergies: Latex, Other, and Percocet [oxycodone-acetaminophen]   Assessment / Plan:     Visit Diagnoses: Rheumatoid arthritis involving multiple sites with positive rheumatoid factor (HCC) - +RF, +CCP.  Her rheumatoid arthritis is well controlled without any synovitis.  We discussed decreasing Plaquenil to 1 tablet p.o. twice daily Monday to Friday if tolerated.  If she start experiencing increased joint discomfort she can increase her dose.  High risk medication use -  Plaquenil 200 mg 1 tablet twice daily.  Eye Exam: 09/28/2019 -we will obtain labs today and then every 5 months.  Plan: CBC with Differential/Platelet, COMPLETE METABOLIC PANEL WITH GFR  Primary osteoarthritis of both hands-she has some DIP thickening.  Joint protection was discussed.  Primary osteoarthritis of both  knees-she is not having much discomfort currently.  Chondromalacia of left patella  Psoriasis-she denies any rash.  Scoliosis, unspecified scoliosis type, unspecified spinal region-chronic discomfort.  Osteopenia of multiple sites-I reviewed her bone density results from January.  They were consistent with osteopenia.  Calcium vitamin D and resistive exercises were discussed.  Other medical problems are listed as follows:  History of anemia  History of breast cancer  History of asthma  Heart murmur  Orders: Orders Placed This Encounter  Procedures  . CBC with Differential/Platelet  . COMPLETE METABOLIC PANEL WITH GFR   No orders of the defined types were placed in this encounter.     Follow-Up Instructions: Return in about 5 months (around 04/06/2020) for Rheumatoid arthritis, Osteoarthritis.   Bo Merino, MD  Note - This record has been created using Editor, commissioning.  Chart creation errors have been sought, but may not always  have been located. Such creation errors do not reflect on  the standard of medical care.

## 2019-11-08 ENCOUNTER — Other Ambulatory Visit: Payer: Self-pay

## 2019-11-08 ENCOUNTER — Encounter: Payer: Self-pay | Admitting: Rheumatology

## 2019-11-08 ENCOUNTER — Ambulatory Visit: Payer: 59 | Admitting: Rheumatology

## 2019-11-08 VITALS — BP 110/62 | HR 62 | Resp 13 | Ht 66.0 in | Wt 164.8 lb

## 2019-11-08 DIAGNOSIS — M419 Scoliosis, unspecified: Secondary | ICD-10-CM

## 2019-11-08 DIAGNOSIS — M0579 Rheumatoid arthritis with rheumatoid factor of multiple sites without organ or systems involvement: Secondary | ICD-10-CM

## 2019-11-08 DIAGNOSIS — Z79899 Other long term (current) drug therapy: Secondary | ICD-10-CM | POA: Diagnosis not present

## 2019-11-08 DIAGNOSIS — M8589 Other specified disorders of bone density and structure, multiple sites: Secondary | ICD-10-CM

## 2019-11-08 DIAGNOSIS — M19041 Primary osteoarthritis, right hand: Secondary | ICD-10-CM | POA: Diagnosis not present

## 2019-11-08 DIAGNOSIS — M170 Bilateral primary osteoarthritis of knee: Secondary | ICD-10-CM | POA: Diagnosis not present

## 2019-11-08 DIAGNOSIS — R011 Cardiac murmur, unspecified: Secondary | ICD-10-CM

## 2019-11-08 DIAGNOSIS — M19042 Primary osteoarthritis, left hand: Secondary | ICD-10-CM

## 2019-11-08 DIAGNOSIS — L409 Psoriasis, unspecified: Secondary | ICD-10-CM

## 2019-11-08 DIAGNOSIS — Z853 Personal history of malignant neoplasm of breast: Secondary | ICD-10-CM

## 2019-11-08 DIAGNOSIS — M2242 Chondromalacia patellae, left knee: Secondary | ICD-10-CM

## 2019-11-08 DIAGNOSIS — Z8709 Personal history of other diseases of the respiratory system: Secondary | ICD-10-CM

## 2019-11-08 DIAGNOSIS — Z862 Personal history of diseases of the blood and blood-forming organs and certain disorders involving the immune mechanism: Secondary | ICD-10-CM

## 2019-11-08 NOTE — Patient Instructions (Signed)
Standing Labs We placed an order today for your standing lab work.    Please come back and get your standing labs in July, 1 week prior to your appointment  We have open lab daily Monday through Thursday from 8:30-12:30 PM and 1:30-4:30 PM and Friday from 8:30-12:30 PM and 1:30-4:00 PM at the office of Dr. Bo Merino.   You may experience shorter wait times on Monday and Friday afternoons. The office is located at [address] [place], [place], [place] [address] No appointment is necessary.   Labs are drawn by [facility].  You may receive a bill from [facility] for your lab work.  If you wish to have your labs drawn at another location, please call the office 24 hours in advance to send orders.  If you have any questions regarding directions or hours of operation,  please call [phone].   Just as a reminder please drink plenty of water prior to coming for your lab work. Thanks!

## 2019-11-09 LAB — CBC WITH DIFFERENTIAL/PLATELET
Absolute Monocytes: 381 cells/uL (ref 200–950)
Basophils Absolute: 29 cells/uL (ref 0–200)
Basophils Relative: 0.7 %
Eosinophils Absolute: 230 cells/uL (ref 15–500)
Eosinophils Relative: 5.6 %
HCT: 43.3 % (ref 35.0–45.0)
Hemoglobin: 14.6 g/dL (ref 11.7–15.5)
Lymphs Abs: 1447 cells/uL (ref 850–3900)
MCH: 31.5 pg (ref 27.0–33.0)
MCHC: 33.7 g/dL (ref 32.0–36.0)
MCV: 93.5 fL (ref 80.0–100.0)
MPV: 11.2 fL (ref 7.5–12.5)
Monocytes Relative: 9.3 %
Neutro Abs: 2013 cells/uL (ref 1500–7800)
Neutrophils Relative %: 49.1 %
Platelets: 193 10*3/uL (ref 140–400)
RBC: 4.63 10*6/uL (ref 3.80–5.10)
RDW: 12.6 % (ref 11.0–15.0)
Total Lymphocyte: 35.3 %
WBC: 4.1 10*3/uL (ref 3.8–10.8)

## 2019-11-09 LAB — COMPLETE METABOLIC PANEL WITH GFR
AG Ratio: 1.8 (calc) (ref 1.0–2.5)
ALT: 18 U/L (ref 6–29)
AST: 20 U/L (ref 10–35)
Albumin: 4.1 g/dL (ref 3.6–5.1)
Alkaline phosphatase (APISO): 56 U/L (ref 37–153)
BUN: 13 mg/dL (ref 7–25)
CO2: 31 mmol/L (ref 20–32)
Calcium: 9.9 mg/dL (ref 8.6–10.4)
Chloride: 105 mmol/L (ref 98–110)
Creat: 0.88 mg/dL (ref 0.50–1.05)
GFR, Est African American: 86 mL/min/{1.73_m2} (ref >=60)
GFR, Est Non African American: 74 mL/min/{1.73_m2} (ref >=60)
Globulin: 2.3 g/dL (calc) (ref 1.9–3.7)
Glucose, Bld: 62 mg/dL — ABNORMAL LOW (ref 65–99)
Potassium: 4.8 mmol/L (ref 3.5–5.3)
Sodium: 143 mmol/L (ref 135–146)
Total Bilirubin: 0.7 mg/dL (ref 0.2–1.2)
Total Protein: 6.4 g/dL (ref 6.1–8.1)

## 2019-11-10 NOTE — Progress Notes (Signed)
CBC and CMP are normal.  Glucose is low most likely related to the time of sample analysis.

## 2019-12-11 ENCOUNTER — Encounter: Payer: Self-pay | Admitting: Certified Nurse Midwife

## 2019-12-15 ENCOUNTER — Other Ambulatory Visit: Payer: Self-pay

## 2019-12-15 ENCOUNTER — Ambulatory Visit: Payer: Self-pay

## 2019-12-15 ENCOUNTER — Encounter: Payer: Self-pay | Admitting: Physician Assistant

## 2019-12-15 ENCOUNTER — Ambulatory Visit: Payer: 59 | Admitting: Rheumatology

## 2019-12-15 VITALS — BP 112/66 | HR 59 | Resp 13 | Ht 67.0 in | Wt 163.0 lb

## 2019-12-15 DIAGNOSIS — Z79899 Other long term (current) drug therapy: Secondary | ICD-10-CM

## 2019-12-15 DIAGNOSIS — L409 Psoriasis, unspecified: Secondary | ICD-10-CM

## 2019-12-15 DIAGNOSIS — M542 Cervicalgia: Secondary | ICD-10-CM | POA: Diagnosis not present

## 2019-12-15 DIAGNOSIS — M8589 Other specified disorders of bone density and structure, multiple sites: Secondary | ICD-10-CM

## 2019-12-15 DIAGNOSIS — M2242 Chondromalacia patellae, left knee: Secondary | ICD-10-CM

## 2019-12-15 DIAGNOSIS — Z862 Personal history of diseases of the blood and blood-forming organs and certain disorders involving the immune mechanism: Secondary | ICD-10-CM

## 2019-12-15 DIAGNOSIS — M419 Scoliosis, unspecified: Secondary | ICD-10-CM

## 2019-12-15 DIAGNOSIS — M5030 Other cervical disc degeneration, unspecified cervical region: Secondary | ICD-10-CM

## 2019-12-15 DIAGNOSIS — M0579 Rheumatoid arthritis with rheumatoid factor of multiple sites without organ or systems involvement: Secondary | ICD-10-CM

## 2019-12-15 DIAGNOSIS — Z8709 Personal history of other diseases of the respiratory system: Secondary | ICD-10-CM

## 2019-12-15 DIAGNOSIS — M19041 Primary osteoarthritis, right hand: Secondary | ICD-10-CM | POA: Diagnosis not present

## 2019-12-15 DIAGNOSIS — M170 Bilateral primary osteoarthritis of knee: Secondary | ICD-10-CM

## 2019-12-15 DIAGNOSIS — R011 Cardiac murmur, unspecified: Secondary | ICD-10-CM

## 2019-12-15 DIAGNOSIS — M19042 Primary osteoarthritis, left hand: Secondary | ICD-10-CM

## 2019-12-15 DIAGNOSIS — Z853 Personal history of malignant neoplasm of breast: Secondary | ICD-10-CM

## 2019-12-15 MED ORDER — PREDNISONE 5 MG PO TABS: ORAL_TABLET | ORAL | 0 refills | Status: DC

## 2019-12-15 NOTE — Addendum Note (Signed)
Addended by: Carole Binning on: 12/15/2019 12:31 PM   Modules accepted: Orders

## 2019-12-15 NOTE — Progress Notes (Signed)
Office Visit Note  Patient: Rebecca Fleming             Date of Birth: [birth year]           MRN: [id number]             PCP: Nunzio Cobbs, MD Referring: Aundria Rud* Visit Date: 12/15/2019 Occupation: @GUAROCC @  Subjective:  Neck pain.   History of Present Illness: Rebecca Fleming is a 54 y.o. female with history of rheumatoid arthritis.  She states that she has been having discomfort in her cervical spine for last 6 months.  Which is gradually been getting worse.  She states in the last 2 days the pain has been much more severe.  She states she had difficulty sleeping at night due to nocturnal pain.  She states the pain initiates in her cervical spine and then radiates to her right shoulder.  She denies any numbness in her hands.  She has discomfort with lateral rotation.  She has not had any flare of rheumatoid arthritis.  Activities of Daily Living:  Patient reports morning stiffness for 1 hour.   Patient Reports nocturnal pain.  Difficulty dressing/grooming: Denies Difficulty climbing stairs: Denies Difficulty getting out of chair: Denies Difficulty using hands for taps, buttons, cutlery, and/or writing: Denies  Review of Systems  Constitutional: Negative for fatigue.  HENT: Negative for mouth sores, mouth dryness and nose dryness.   Eyes: Negative for itching and dryness.  Respiratory: Negative for shortness of breath, wheezing and difficulty breathing.   Cardiovascular: Negative for chest pain and palpitations.  Gastrointestinal: Negative for blood in stool, constipation and diarrhea.  Endocrine: Negative for increased urination.  Genitourinary: Negative for difficulty urinating.  Musculoskeletal: Positive for arthralgias, joint pain and morning stiffness. Negative for joint swelling.  Skin: Negative for rash, hair loss and redness.  Allergic/Immunologic: Negative for susceptible to infections.  Neurological: Positive for headaches. Negative for  dizziness, numbness, memory loss and weakness.  Hematological: Positive for bruising/bleeding tendency.  Psychiatric/Behavioral: Negative for confusion.    PMFS History:  Patient Active Problem List   Diagnosis Date Noted  . Primary osteoarthritis of both knees 09/01/2017  . Scoliosis 09/01/2017  . High risk medication use 11/11/2016  . Primary osteoarthritis of both hands 11/11/2016  . Lateral epicondylitis, right elbow 11/11/2016  . Myopia with astigmatism and presbyopia, bilateral 09/09/2016  . 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 Status post laparoscopic assisted vaginal hysterectomy (LAVH) 01/15/2015  . Breast cancer, left breast ([place]) 11/07/2014  . Postmenopausal bleeding 07/13/2013  . Rheumatoid arthritis ([place]) 01/11/2012  . Psoriasis 01/11/2012    Past Medical History:  Diagnosis Date  . Abnormal uterine bleeding   . Allergy   . Anemia   . Arthritis   . Asthma    exercised induced asthma - rarely uses inhaler;none since Chemo.  . Blood transfusion without reported diagnosis 1976   1976  following surgery at [facility]   . Cancer ([place]) 08/2008   left breast--has bilateral mastectomy  . Complication of anesthesia    pt states" gets shakes" with general  . Cyst near tailbone   . Elevated LDL cholesterol level   . Environmental and seasonal allergies   . Fibroid   . Headache    sleeps , no meds  . Heart murmur    never has caused any problems  . Helicobacter pylori infection   . Hemorrhoids    hx of  . History  of breast cancer   . History of shingles    03/07/18  . Hypotension    hx of  . Post-operative nausea and vomiting   . RA (rheumatoid arthritis) ([place])   . RA (rheumatoid arthritis) ([place])   . Scoliosis    tx with robaxin prn - rarely uses  . SVD (spontaneous vaginal delivery) 1996   x 1    Family History  Problem Relation Age of Onset  . Breast cancer Mother   . Diabetes Mother   . Hypertension Mother   . Stroke Mother   .  Irritable bowel syndrome Mother   . Kidney disease Mother   . Heart failure Father   . Hypertension Father   . Colon cancer Neg Hx   . Colon polyps Neg Hx   . Esophageal cancer Neg Hx   . Pancreatic cancer Neg Hx   . Rectal cancer Neg Hx   . Stomach cancer Neg Hx    Past Surgical History:  Procedure Laterality Date  . ABDOMINAL HYSTERECTOMY    . APPENDECTOMY    . BREAST LUMPECTOMY  05/17/2008   x 2 re excise for margin - Dr Margot Chimes, bilateral mastectomy 08/2008  . BREAST LUMPECTOMY W/ NEEDLE LOCALIZATION  04/23/2008   w SLN Dr Margot Chimes  . CESAREAN SECTION  01/01/2004   Dr Quincy Simmonds  . COLONOSCOPY  03/2018  . COLONOSCOPY  05/05/2019  . ENDOMETRIAL BIOPSY  06/2013, 2015   x 2  . EYE SURGERY  2003   bilateral lasik  . FOOT SURGERY    . HYSTEROSCOPY WITH D & C  07/21/2011   Procedure: DILATATION AND CURETTAGE (D&C) /HYSTEROSCOPY;  Surgeon: Arloa Koh;  Location: [facility];  Service: Gynecology;  Laterality: N/A;  with Ultrasound Guidance  . INSERTION OF TISSUE EXPANDER AFTER MASTECTOMY  09/11/2008   Dr Harlow Mares  . KNEE ARTHROSCOPY W/ MENISCAL REPAIR Right 2010   --Dr. Telford Nab  . LAPAROSCOPIC ASSISTED VAGINAL HYSTERECTOMY N/A 01/15/2015   Procedure: LAPAROSCOPIC ASSISTED VAGINAL HYSTERECTOMY;  Surgeon: Nunzio Cobbs, MD;  Location: [facility];  Service: Gynecology;  Laterality: N/A;  . left foot surgery  2004  . left rotator cuff repair  01/2006  . MASTECTOMY  09/11/2008   bilateral - Dr Margot Chimes  . PORT-A-CATH REMOVAL  09/09/2009   Dr Margot Chimes  . PORTACATH PLACEMENT  05/17/2008   Dr Cori Razor rounds of chemo  . REMOVAL OF TISSUE EXPANDER AND PLACEMENT OF IMPLANT  12/2008  . RHINOPLASTY    . SALPINGOOPHORECTOMY Bilateral 01/15/2015   Procedure: BILATERAL SALPINGO OOPHORECTOMY;  Surgeon: Nunzio Cobbs, MD;  Location: [facility];  Service: Gynecology;  Laterality: Bilateral;  . UMBILICAL HERNIA REPAIR  09/09/2009   Dr Margot Chimes  . UPPER GI ENDOSCOPY  03/2018  . WISDOM TOOTH  EXTRACTION     Social History   Social History Narrative  . Not on file   Immunization History  Administered Date(s) Administered  . PFIZER SARS-COV-2 Vaccination 10/05/2019, 10/25/2019  . Pneumococcal Polysaccharide-23 01/16/2015  . Tdap 09/21/2010     Objective: Vital Signs: BP 112/66 (BP Location: Right Arm, Patient Position: Sitting, Cuff Size: Normal)   Pulse (!) 59   Resp 13   Ht 5\' 7"  (1.702 m)   Wt 163 lb (73.9 kg)   LMP 11/12/2014   BMI 25.53 kg/m    Physical Exam Vitals and nursing note reviewed.  Constitutional:      Appearance: She is well-developed.  HENT:  Head: Normocephalic and atraumatic.  Eyes:     Conjunctiva/sclera: Conjunctivae normal.  Cardiovascular:     Rate and Rhythm: Normal rate and regular rhythm.     Heart sounds: Normal heart sounds.  Pulmonary:     Effort: Pulmonary effort is normal.     Breath sounds: Normal breath sounds.  Abdominal:     General: Bowel sounds are normal.     Palpations: Abdomen is soft.  Musculoskeletal:     Cervical back: Normal range of motion.  Lymphadenopathy:     Cervical: No cervical adenopathy.  Skin:    General: Skin is warm and dry.     Capillary Refill: Capillary refill takes less than 2 seconds.  Neurological:     Mental Status: She is alert and oriented to person, place, and time.  Psychiatric:        Behavior: Behavior normal.      Musculoskeletal Exam: No point tenderness was noted over the cervical spine.  Although she has limited range of motion with right lateral rotation.  She had full flexion and extension.  She had good range of motion of bilateral shoulders, elbow joints clearly.  She had no synovitis over wrist joint or MCPs.  Some PIP and DIP thickening was noted.  Knee joints and ankles were in good range of motion with no synovitis.  CDAI Exam: CDAI Score: 0.4  Patient Global: 2 mm; Provider Global: 2 mm Swollen: 0 ; Tender: 1  Joint Exam 12/15/2019      Right  Left  Cervical  Spine   Tender        Investigation: No additional findings.  Imaging: XR Cervical Spine 2 or 3 views  Result Date: 12/15/2019 Loss of cervical lordosis was noted.  Multilevel spondylosis was noted.  C5-C6 and C6-7 narrowing was noted.  Anterior osteophytes were noted.  Posterior spurring was noted.  C6-C7 mild spondylolisthesis was noted. Impression: These findings are consistent with severe multilevel spondylosis.   Recent Labs: Lab Results  Component Value Date   WBC 4.1 11/08/2019   HGB 14.6 11/08/2019   PLT 193 11/08/2019   NA 143 11/08/2019   K 4.8 11/08/2019   CL 105 11/08/2019   CO2 31 11/08/2019   GLUCOSE 62 (L) 11/08/2019   BUN 13 11/08/2019   CREATININE 0.88 11/08/2019   BILITOT 0.7 11/08/2019   ALKPHOS 66 03/31/2017   AST 20 11/08/2019   ALT 18 11/08/2019   PROT 6.4 11/08/2019   ALBUMIN 4.1 03/31/2017   CALCIUM 9.9 11/08/2019   GFRAA 86 11/08/2019    Speciality Comments: PLQ Eye Exam: 09/28/2019 WNL @ [facility]. Follow up in 1 year. (in [place])  Procedures:  No procedures performed Allergies: Latex, Other, and Percocet [oxycodone-acetaminophen]   Assessment / Plan:     Visit Diagnoses: Neck pain -she has been having pain and discomfort in her cervical spine for 6 months which has been progressively getting worse.  She states been very severe in the last 2 days.  She has had nocturnal pain.  She has limited lateral rotation.  Plan: XR Cervical Spine 2 or 3 views.  The x-ray of the cervical spine showed multilevel spondylosis anterior and posterior osteophytes and spondylolisthesis.  As she has severe pain and radiculopathy I will schedule MRI of the cervical spine.  She is a lot of discomfort.  I will call in prednisone taper starting at 20 mg and taper by 5 mg every 2 days.  I will also refer her  to Dr. Louanne Skye or Dr. Lorin Mercy for evaluation.  Rheumatoid arthritis involving multiple sites with positive rheumatoid factor (HCC) - +RF, +CCP.  Her  rheumatoid arthritis appears to be well controlled without any synovitis.  High risk medication use - Plaquenil 200 mg 1 tablet twice daily.  Eye Exam: 09/28/2019   Primary osteoarthritis of both hands-she has some DIP and PIP thickening.  Primary osteoarthritis of both knees  Chondromalacia of left patella  Psoriasis  Osteopenia of multiple sites - bone density results from January.  They were consistent with osteopenia.  Scoliosis, unspecified scoliosis type, unspecified spinal region  Heart murmur  History of breast cancer  History of anemia  History of asthma  Orders: Orders Placed This Encounter  Procedures  . XR Cervical Spine 2 or 3 views  . MR CERVICAL SPINE WO CONTRAST   No orders of the defined types were placed in this encounter.    Follow-Up Instructions: Return for Rheumatoid arthritis, osteoarthritis, DDD.   Bo Merino, MD  Note - This record has been created using Editor, commissioning.  Chart creation errors have been sought, but may not always  have been located. Such creation errors do not reflect on  the standard of medical care.

## 2019-12-15 NOTE — Addendum Note (Signed)
Addended by: Shona Needles on: 12/15/2019 12:15 PM   Modules accepted: Orders

## 2020-01-04 ENCOUNTER — Other Ambulatory Visit: Payer: Self-pay | Admitting: Rheumatology

## 2020-01-04 DIAGNOSIS — M0579 Rheumatoid arthritis with rheumatoid factor of multiple sites without organ or systems involvement: Secondary | ICD-10-CM

## 2020-01-04 NOTE — Telephone Encounter (Signed)
Last Visit: 12/15/2019 Next Visit: 04/04/2020 Labs: 11/08/2019 CBC and CMP are normal. Glucose is low most likely related to the time of sample analysis. Eye exam: 09/28/2019  Okay to refill per Dr. Estanislado Pandy.

## 2020-01-09 ENCOUNTER — Ambulatory Visit (HOSPITAL_COMMUNITY)
Admission: RE | Admit: 2020-01-09 | Discharge: 2020-01-09 | Disposition: A | Discharge status: Home / Self Care | Payer: 59 | Source: Ambulatory Visit | Attending: Rheumatology | Admitting: Rheumatology

## 2020-01-09 ENCOUNTER — Other Ambulatory Visit: Payer: Self-pay

## 2020-01-09 DIAGNOSIS — M542 Cervicalgia: Secondary | ICD-10-CM | POA: Insufficient documentation

## 2020-01-10 NOTE — Progress Notes (Signed)
I called patient to discuss results with her.  She has an appointment with Dr. Louanne Skye next Wednesday.  I offered referral to Dr. Ernestina Patches and physical therapy.  She will wait until she sees Dr. Louanne Skye.

## 2020-01-17 ENCOUNTER — Ambulatory Visit: Payer: 59 | Admitting: Specialist

## 2020-01-17 ENCOUNTER — Other Ambulatory Visit: Payer: Self-pay

## 2020-01-17 ENCOUNTER — Encounter: Payer: Self-pay | Admitting: Specialist

## 2020-01-17 VITALS — BP 105/67 | HR 57 | Ht 67.0 in | Wt 163.0 lb

## 2020-01-17 DIAGNOSIS — M47819 Spondylosis without myelopathy or radiculopathy, site unspecified: Secondary | ICD-10-CM

## 2020-01-17 DIAGNOSIS — M542 Cervicalgia: Secondary | ICD-10-CM

## 2020-01-17 DIAGNOSIS — M4105 Infantile idiopathic scoliosis, thoracolumbar region: Secondary | ICD-10-CM | POA: Diagnosis not present

## 2020-01-17 DIAGNOSIS — M5136 Other intervertebral disc degeneration, lumbar region: Secondary | ICD-10-CM

## 2020-01-17 DIAGNOSIS — M47812 Spondylosis without myelopathy or radiculopathy, cervical region: Secondary | ICD-10-CM

## 2020-01-17 MED ORDER — METHOCARBAMOL 500 MG PO TABS
500.0000 mg | ORAL_TABLET | Freq: Three times a day (TID) | ORAL | 1 refills | Status: DC | PRN
Start: 2020-01-17 — End: 2022-06-08

## 2020-01-17 MED ORDER — GABAPENTIN 100 MG PO CAPS: 100.0000 mg | ORAL_CAPSULE | Freq: Every day | ORAL | 3 refills | Status: DC

## 2020-01-17 MED ORDER — DICLOFENAC SODIUM 50 MG PO TBEC: DELAYED_RELEASE_TABLET | ORAL | 3 refills | Status: DC

## 2020-01-17 NOTE — Patient Instructions (Addendum)
Avoid overhead lifting and overhead use of the arms. Do not lift greater than 10-15lbs. Adjust head rest in vehicle to prevent hyperextension if rear ended. Take extra precautions to avoid falling. Physical therapy for cervical spondylosis and lumbar degenerative disc disease.

## 2020-01-17 NOTE — Progress Notes (Signed)
Office Visit Note   Patient: Rebecca Fleming           Date of Birth: 02-22-1966           MRN: [id number] Visit Date: 01/17/2020              Requested by: Bo Merino, MD [address] [place],  [place] [address] PCP: Nunzio Cobbs, MD   Assessment & Plan: Visit Diagnoses:  1. Cervicalgia   2. Infantile idiopathic scoliosis of thoracolumbar region   3. Degenerative disc disease, lumbar   4. Spondylosis without myelopathy or radiculopathy   5. Spondylosis without myelopathy or radiculopathy, cervical region     Plan: Avoid overhead lifting and overhead use of the arms. Do not lift greater than 10-15lbs. Adjust head rest in vehicle to prevent hyperextension if rear ended. Take extra precautions to avoid falling. Physical therapy for cervical spondylosis and lumbar degenerative disc disease.  Follow-Up Instructions: Return in about 4 weeks (around 02/14/2020).   Orders:  No orders of the defined types were placed in this encounter.  No orders of the defined types were placed in this encounter.     Procedures: No procedures performed   Clinical Data: No additional findings.   Subjective: Chief Complaint  Patient presents with  . Neck - Pain    54 year old right handed female with several year history of RA treated by Dr. Estanislado Pandy, now on Plaquenil, took prednisone for arthritis pain and neck pain and this helped. Took a short dose pack and this has stopped and the pain in her neck is returning. She has been experiencing pain in to the neck and right neck and right shoulder and right trapezius and is associated withright occipital head pain or head aches. She does experience sensation of clumbsiness that is about equal. She has been having no difficulty walking. No bowel or bladder difficulty. She does have  Back pain in the neck and the lumbar spine.  She has pain with vaccuuming, sweeping and bending and stooping. Riding in a car for  long distances is painful. There was night pain and she had Difficulty getting comfortable at night due to neck pain. There is pain with turning the neck to the left and extend the neck. The pain is better post steroid dose pak.     Review of Systems  Constitutional: Negative.  Negative for activity change, appetite change, chills, diaphoresis, fatigue, fever and unexpected weight change.  HENT: Positive for congestion, rhinorrhea, sinus pressure, sinus pain, sneezing and sore throat. Negative for dental problem, drooling, ear discharge, ear pain, facial swelling, hearing loss, mouth sores, nosebleeds, postnasal drip, tinnitus, trouble swallowing and voice change.   Eyes: Positive for redness and itching. Negative for photophobia, pain, discharge and visual disturbance.  Respiratory: Negative.  Negative for apnea, cough, choking, chest tightness, shortness of breath, wheezing and stridor.   Cardiovascular: Negative for chest pain, palpitations and leg swelling.  Gastrointestinal: Positive for rectal pain. Negative for abdominal distention, abdominal pain, anal bleeding, blood in stool, constipation, diarrhea, nausea and vomiting.  Endocrine: Positive for cold intolerance. Negative for heat intolerance, polydipsia, polyphagia and polyuria.  Genitourinary: Negative for difficulty urinating, dyspareunia, dysuria, enuresis, flank pain, frequency, hematuria and urgency.  Musculoskeletal: Positive for arthralgias, back pain, neck pain and neck stiffness. Negative for gait problem, joint swelling and myalgias.  Skin: Negative for color change, pallor, rash and wound.  Allergic/Immunologic: Positive for environmental allergies and food allergies (yellow  dye #5). Negative for immunocompromised state.  Neurological: Positive for dizziness, tremors, seizures, syncope, speech difficulty, weakness, light-headedness, numbness and headaches. Negative for facial asymmetry.  Hematological: Negative for adenopathy.  Bruises/bleeds easily.  Psychiatric/Behavioral: Negative.  Negative for agitation, behavioral problems, confusion, decreased concentration, dysphoric mood, hallucinations, self-injury, sleep disturbance and suicidal ideas. The patient is not nervous/anxious and is not hyperactive.      Objective: Vital Signs: BP 105/67 (BP Location: Right Arm, Patient Position: Sitting)   Pulse (!) 57   Ht 5\' 7"  (1.702 m)   Wt 163 lb (73.9 kg)   LMP 11/12/2014   BMI 25.53 kg/m   Physical Exam Constitutional:      Appearance: She is well-developed.  HENT:     Head: Normocephalic and atraumatic.  Eyes:     Pupils: Pupils are equal, round, and reactive to light.  Pulmonary:     Effort: Pulmonary effort is normal.     Breath sounds: Normal breath sounds.  Abdominal:     General: Bowel sounds are normal.     Palpations: Abdomen is soft.  Musculoskeletal:     Cervical back: Normal range of motion and neck supple.     Lumbar back: Negative right straight leg raise test and negative left straight leg raise test.  Skin:    General: Skin is warm and dry.  Neurological:     Mental Status: She is alert and oriented to person, place, and time.  Psychiatric:        Behavior: Behavior normal.        Thought Content: Thought content normal.        Judgment: Judgment normal.     Back Exam   Tenderness  The patient is experiencing tenderness in the lumbar and cervical.  Range of Motion  Extension:  80 abnormal  Flexion:  80 abnormal  Lateral bend right:  80 abnormal  Lateral bend left: 80  Rotation right: 80  Rotation left: 80   Muscle Strength  Right Quadriceps:  5/5  Left Quadriceps:  5/5  Right Hamstrings:  5/5  Left Hamstrings:  5/5   Tests  Straight leg raise right: negative Straight leg raise left: negative  Reflexes  Patellar: 2/4 Achilles: 2/4 Biceps: 2/4 Babinski's sign: normal   Other  Toe walk: normal Heel walk: normal Sensation: normal Gait: normal  Erythema: no  back redness Scars: absent  Comments:  Hoffman's sign is negative       Specialty Comments:  No specialty comments available.  Imaging: No results found.   PMFS History: Patient Active Problem List   Diagnosis Date Noted  . Primary osteoarthritis of both knees 09/01/2017  . Scoliosis 09/01/2017  . High risk medication use 11/11/2016  . Primary osteoarthritis of both hands 11/11/2016  . Lateral epicondylitis, right elbow 11/11/2016  . Myopia with astigmatism and presbyopia, bilateral 09/09/2016  . 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 Status post laparoscopic assisted vaginal hysterectomy (LAVH) 01/15/2015  . Breast cancer, left breast ([place]) 11/07/2014  . Postmenopausal bleeding 07/13/2013  . Rheumatoid arthritis ([place]) 01/11/2012  . Psoriasis 01/11/2012   Past Medical History:  Diagnosis Date  . Abnormal uterine bleeding   . Allergy   . Anemia   . Arthritis   . Asthma    exercised induced asthma - rarely uses inhaler;none since Chemo.  . Blood transfusion without reported diagnosis 1976   1976  following surgery at [facility]   . Cancer ([place]) 08/2008   left breast--has  bilateral mastectomy  . Complication of anesthesia    pt states" gets shakes" with general  . Cyst near tailbone   . Elevated LDL cholesterol level   . Environmental and seasonal allergies   . Fibroid   . Headache    sleeps , no meds  . Heart murmur    never has caused any problems  . Helicobacter pylori infection   . Hemorrhoids    hx of  . History of breast cancer   . History of shingles    03/07/18  . Hypotension    hx of  . Post-operative nausea and vomiting   . RA (rheumatoid arthritis) ([place])   . RA (rheumatoid arthritis) ([place])   . Scoliosis    tx with robaxin prn - rarely uses  . SVD (spontaneous vaginal delivery) 1996   x 1    Family History  Problem Relation Age of Onset  . Breast cancer Mother   . Diabetes Mother   . Hypertension Mother   . Stroke Mother    . Irritable bowel syndrome Mother   . Kidney disease Mother   . Heart failure Father   . Hypertension Father   . Colon cancer Neg Hx   . Colon polyps Neg Hx   . Esophageal cancer Neg Hx   . Pancreatic cancer Neg Hx   . Rectal cancer Neg Hx   . Stomach cancer Neg Hx     Past Surgical History:  Procedure Laterality Date  . ABDOMINAL HYSTERECTOMY    . APPENDECTOMY    . BREAST LUMPECTOMY  05/17/2008   x 2 re excise for margin - Dr Margot Chimes, bilateral mastectomy 08/2008  . BREAST LUMPECTOMY W/ NEEDLE LOCALIZATION  04/23/2008   w SLN Dr Margot Chimes  . CESAREAN SECTION  01/01/2004   Dr Quincy Simmonds  . COLONOSCOPY  03/2018  . COLONOSCOPY  05/05/2019  . ENDOMETRIAL BIOPSY  06/2013, 2015   x 2  . EYE SURGERY  2003   bilateral lasik  . FOOT SURGERY    . HYSTEROSCOPY WITH D & C  07/21/2011   Procedure: DILATATION AND CURETTAGE (D&C) /HYSTEROSCOPY;  Surgeon: Arloa Koh;  Location: [facility];  Service: Gynecology;  Laterality: N/A;  with Ultrasound Guidance  . INSERTION OF TISSUE EXPANDER AFTER MASTECTOMY  09/11/2008   Dr Harlow Mares  . KNEE ARTHROSCOPY W/ MENISCAL REPAIR Right 2010   --Dr. Telford Nab  . LAPAROSCOPIC ASSISTED VAGINAL HYSTERECTOMY N/A 01/15/2015   Procedure: LAPAROSCOPIC ASSISTED VAGINAL HYSTERECTOMY;  Surgeon: Nunzio Cobbs, MD;  Location: [facility];  Service: Gynecology;  Laterality: N/A;  . left foot surgery  2004  . left rotator cuff repair  01/2006  . MASTECTOMY  09/11/2008   bilateral - Dr Margot Chimes  . PORT-A-CATH REMOVAL  09/09/2009   Dr Margot Chimes  . PORTACATH PLACEMENT  05/17/2008   Dr Cori Razor rounds of chemo  . REMOVAL OF TISSUE EXPANDER AND PLACEMENT OF IMPLANT  12/2008  . RHINOPLASTY    . SALPINGOOPHORECTOMY Bilateral 01/15/2015   Procedure: BILATERAL SALPINGO OOPHORECTOMY;  Surgeon: Nunzio Cobbs, MD;  Location: [facility];  Service: Gynecology;  Laterality: Bilateral;  . UMBILICAL HERNIA REPAIR  09/09/2009   Dr Margot Chimes  . UPPER GI ENDOSCOPY  03/2018  . WISDOM TOOTH  EXTRACTION     Social History   Occupational History  . Not on file  Tobacco Use  . Smoking status: Never Smoker  . Smokeless tobacco: Never Used  Substance and Sexual Activity  . Alcohol  use: Yes    Alcohol/week: 2.0 standard drinks    Types: 2 Cans of beer per week    Comment: occ glass of wine or beer  . Drug use: No  . Sexual activity: Yes    Partners: Male    Birth control/protection: Post-menopausal    Comment: Hyst/BSO

## 2020-01-17 NOTE — Addendum Note (Signed)
Addended by: Basil Dess on: 01/17/2020 01:34 PM   Modules accepted: Orders

## 2020-01-20 ENCOUNTER — Other Ambulatory Visit: Payer: Self-pay | Admitting: Rheumatology

## 2020-01-22 NOTE — Telephone Encounter (Signed)
Last Visit: 12/15/2019 Next Visit: 04/04/2020  Okay to refill per Dr. Estanislado Pandy.

## 2020-01-29 ENCOUNTER — Encounter: Payer: Self-pay | Admitting: Physical Therapy

## 2020-01-29 ENCOUNTER — Ambulatory Visit: Payer: 59 | Attending: Specialist | Admitting: Physical Therapy

## 2020-01-29 ENCOUNTER — Other Ambulatory Visit: Payer: Self-pay

## 2020-01-29 DIAGNOSIS — M542 Cervicalgia: Secondary | ICD-10-CM | POA: Insufficient documentation

## 2020-01-29 DIAGNOSIS — M546 Pain in thoracic spine: Secondary | ICD-10-CM | POA: Insufficient documentation

## 2020-01-29 DIAGNOSIS — M6281 Muscle weakness (generalized): Secondary | ICD-10-CM | POA: Diagnosis not present

## 2020-01-29 NOTE — Therapy (Signed)
[facility] [place], [place], [address] Phone: [phone]   Fax:  [phone]  Physical Therapy Evaluation  Patient Details  Name: Rebecca Fleming MRN: [id number] Date of Birth: March 22, 1966 Referring Provider (PT): Basil Dess   Encounter Date: 01/29/2020  PT End of Session - 01/29/20 1029    Visit Number  1    Date for PT Re-Evaluation  03/30/20    PT Start Time  0845    PT Stop Time  0930    PT Time Calculation (min)  45 min    Activity Tolerance  Patient tolerated treatment well    Behavior During Therapy  [facility] for tasks assessed/performed       Past Medical History:  Diagnosis Date  . Abnormal uterine bleeding   . Allergy   . Anemia   . Arthritis   . Asthma    exercised induced asthma - rarely uses inhaler;none since Chemo.  . Blood transfusion without reported diagnosis 1976   1976  following surgery at [facility]   . Cancer ([place]) 08/2008   left breast--has bilateral mastectomy  . Complication of anesthesia    pt states" gets shakes" with general  . Cyst near tailbone   . Elevated LDL cholesterol level   . Environmental and seasonal allergies   . Fibroid   . Headache    sleeps , no meds  . Heart murmur    never has caused any problems  . Helicobacter pylori infection   . Hemorrhoids    hx of  . History of breast cancer   . History of shingles    03/07/18  . Hypotension    hx of  . Post-operative nausea and vomiting   . RA (rheumatoid arthritis) ([place])   . RA (rheumatoid arthritis) ([place])   . Scoliosis    tx with robaxin prn - rarely uses  . SVD (spontaneous vaginal delivery) 1996   x 1    Past Surgical History:  Procedure Laterality Date  . ABDOMINAL HYSTERECTOMY    . APPENDECTOMY    . BREAST LUMPECTOMY  05/17/2008   x 2 re excise for margin - Dr Margot Chimes, bilateral mastectomy 08/2008  . BREAST LUMPECTOMY W/ NEEDLE LOCALIZATION  04/23/2008   w SLN Dr Margot Chimes  . CESAREAN SECTION   01/01/2004   Dr Quincy Simmonds  . COLONOSCOPY  03/2018  . COLONOSCOPY  05/05/2019  . ENDOMETRIAL BIOPSY  06/2013, 2015   x 2  . EYE SURGERY  2003   bilateral lasik  . FOOT SURGERY    . HYSTEROSCOPY WITH D & C  07/21/2011   Procedure: DILATATION AND CURETTAGE (D&C) /HYSTEROSCOPY;  Surgeon: Arloa Koh;  Location: [facility];  Service: Gynecology;  Laterality: N/A;  with Ultrasound Guidance  . INSERTION OF TISSUE EXPANDER AFTER MASTECTOMY  09/11/2008   Dr Harlow Mares  . KNEE ARTHROSCOPY W/ MENISCAL REPAIR Right 2010   --Dr. Telford Nab  . LAPAROSCOPIC ASSISTED VAGINAL HYSTERECTOMY N/A 01/15/2015   Procedure: LAPAROSCOPIC ASSISTED VAGINAL HYSTERECTOMY;  Surgeon: Nunzio Cobbs, MD;  Location: [facility];  Service: Gynecology;  Laterality: N/A;  . left foot surgery  2004  . left rotator cuff repair  01/2006  . MASTECTOMY  09/11/2008   bilateral - Dr Margot Chimes  . PORT-A-CATH REMOVAL  09/09/2009   Dr Margot Chimes  . PORTACATH PLACEMENT  05/17/2008   Dr Cori Razor rounds of chemo  . REMOVAL OF TISSUE EXPANDER AND PLACEMENT OF IMPLANT  12/2008  .  RHINOPLASTY    . SALPINGOOPHORECTOMY Bilateral 01/15/2015   Procedure: BILATERAL SALPINGO OOPHORECTOMY;  Surgeon: Nunzio Cobbs, MD;  Location: [facility];  Service: Gynecology;  Laterality: Bilateral;  . UMBILICAL HERNIA REPAIR  09/09/2009   Dr Margot Chimes  . UPPER GI ENDOSCOPY  03/2018  . WISDOM TOOTH EXTRACTION      There were no vitals filed for this visit.   Subjective Assessment - 01/29/20 0846    Subjective  Pt reports that she has been experiencing neck pain starting ~8 mos ago that is aggravated by rotating head L>R, pain while sleeping, radiating from neck to head and into R shoulder. Pt took a course of prednisone ~1 mo ago which helped alleviate the pain. Pt is feeling the neck pain still when she rotates her head and tightness in neck at rest. Pt had MRI which showed bone spur on R side ~C6.    Pertinent History  RA, arthritis, scoliosis    Limitations   Sitting;Reading    How long can you sit comfortably?  <1 hr    Diagnostic tests  MRI    Patient Stated Goals  prevent pain/radiating pain from flaring up again    Currently in Pain?  Yes    Pain Score  2     Pain Location  Neck    Pain Orientation  Right;Mid    Pain Descriptors / Indicators  Aching    Pain Type  Acute pain    Pain Radiating Towards  no radiating pain anymore but was radiating up to back of R head and down into posterior R shoulder    Pain Onset  More than a month ago    Pain Frequency  Intermittent    Aggravating Factors   cervical rotation, reading, watching tv    Pain Relieving Factors  lying down, moving around         [facility] PT Assessment - 01/29/20 0001      Assessment   Medical Diagnosis  Cervicalgia/C6-C7 cervical spondylosis    Referring Provider (PT)  Basil Dess    Next MD Visit  03/06/2020    Prior Therapy  PT for rotator cuff sx      Precautions   Precautions  None    Precaution Comments  RA      Restrictions   Weight Bearing Restrictions  No      Balance Screen   Has the patient fallen in the past 6 months  No    Has the patient had a decrease in activity level because of a fear of falling?   No    Is the patient reluctant to leave their home because of a fear of falling?   No      Prior Function   Level of Independence  Independent    Vocation  Full time employment    Vocation Requirements  works from home    Leisure  teaches aerobics classes, reads      Sensation   Light Touch  Appears Intact      Posture/Postural Control   Posture/Postural Control  Postural limitations    Postural Limitations  Rounded Shoulders;Forward head    Posture Comments  scoliosis; R shoulder elevated as compared to L      ROM / Strength   AROM / PROM / Strength  AROM;Strength      AROM   Overall AROM Comments  Shoulder AROM WFL    AROM Assessment Site  Cervical;Shoulder    Right/Left  Shoulder  Right;Left    Cervical Flexion  50    Cervical Extension   45    Cervical - Right Side Bend  25    Cervical - Left Side Bend  25    Cervical - Right Rotation  55    Cervical - Left Rotation  55      Strength   Overall Strength Comments  UE strength WFL B, scap retractors 4+/5      Palpation   Palpation comment  tender to palpation B UT, B suboccipitals R>L, C3-T2 spinous process                Objective measurements completed on examination: See above findings.      [facility] Adult PT Treatment/Exercise - 01/29/20 0001      Exercises   Exercises  Neck      Neck Exercises: Theraband   Scapula Retraction  10 reps;Red      Neck Exercises: Seated   Neck Retraction  10 reps;3 secs      Neck Exercises: Stretches   Upper Trapezius Stretch  2 reps;30 seconds    Levator Stretch  2 reps;30 seconds    Other Neck Stretches  thoracic open book rotations x10 B    Other Neck Stretches  child's pose x10 with 5 sec hold             PT Education - 01/29/20 1028    Education Details  Pt educated on condition, rehab process, and HEP    Person(s) Educated  Patient    Methods  Explanation;Demonstration;Handout    Comprehension  Verbalized understanding;Returned demonstration       PT Short Term Goals - 01/29/20 1047      PT SHORT TERM GOAL #1   Title  Pt will be independent with HEP        PT Long Term Goals - 01/29/20 1259      PT LONG TERM GOAL #1   Title  Pt will demonstrate cervical rotation and SB bilat WFL with no reports of pain    Baseline  55 deg rotation, 25 deg side bending    Time  8    Period  Weeks    Target Date  03/25/20      PT LONG TERM GOAL #2   Title  Pt will report ability to read >1hr with no increase in cervical pain    Baseline  ~20 min    Time  8    Period  Weeks    Status  New    Target Date  03/25/20      PT LONG TERM GOAL #3   Title  Pt will reduce cervical pain by 50%    Time  8    Status  New    Target Date  03/25/20      PT LONG TERM GOAL #4   Title  Pt will report ability to  sleep through night with no instances of cervical pain    Time  8    Period  Weeks    Status  New    Target Date  03/25/20             Plan - 01/29/20 1029    Clinical Impression Statement  Pt reports to clinic with dx of C6 cervical spondylosis from MD and reports of cervical pain lasting ~8 months. Pt was experiencing radiating pain into R suboccipial and R posterior shoulder; this and some of the pain resolved  with course of prednisone. Pt presents with deficits in cervical lateral flexion and rotation ROM, tenderness to palpation and tightness of UT/suboccipitals, and reports of cervical pain with rotation and reading. Pt would benefit from skilled PT to address the above functional impairments.    Personal Factors and Comorbidities  Comorbidity 1    Comorbidities  RA    Examination-Activity Limitations  Bend;Carry;Lift;Sleep;Sit    Examination-Participation Restrictions  Cleaning;Other   reading, work from home   Stability/Clinical Decision Making  Stable/Uncomplicated    Clinical Decision Making  Low    PT Frequency  2x / week    PT Duration  8 weeks    PT Treatment/Interventions  ADLs/Self Care Home Management;Electrical Stimulation;Ultrasound;Traction;Moist Heat;Iontophoresis 4mg /ml Dexamethasone;Functional mobility training;Therapeutic activities;Therapeutic exercise;Neuromuscular re-education;Manual techniques;Patient/family education;Passive range of motion;Dry needling;Taping    PT Next Visit Plan  Initiate UE/cervical flexibility and strengthening, review HEP, manual/modalities as indicated    PT Home Exercise Plan  UT stretch, levator stretch, scapular retraction with red TB, cervical retraction, open book thoracic rotation, child's pose    Consulted and Agree with Plan of Care  Patient       Patient will benefit from skilled therapeutic intervention in order to improve the following deficits and impairments:  Decreased range of motion, Increased muscle spasms, Impaired  UE functional use, Decreased activity tolerance, Pain, Impaired flexibility, Improper body mechanics, Decreased strength, Postural dysfunction  Visit Diagnosis: Muscle weakness (generalized)  Cervicalgia  Neck pain on right side  Pain in thoracic spine     Problem List Patient Active Problem List   Diagnosis Date Noted  . Primary osteoarthritis of both knees 09/01/2017  . Scoliosis 09/01/2017  . High risk medication use 11/11/2016  . Primary osteoarthritis of both hands 11/11/2016  . Lateral epicondylitis, right elbow 11/11/2016  . Myopia with astigmatism and presbyopia, bilateral 09/09/2016  . 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 Status post laparoscopic assisted vaginal hysterectomy (LAVH) 01/15/2015  . Breast cancer, left breast ([place]) 11/07/2014  . Postmenopausal bleeding 07/13/2013  . Rheumatoid arthritis ([place]) 01/11/2012  . Psoriasis 01/11/2012   Amador Cunas, PT, DPT Donald Prose Kenna Kirn 01/29/2020, 1:04 PM  [facility] [place], [place], [address] Phone: [phone]   Fax:  [phone]  Name: Rebecca Fleming MRN: [id number] Date of Birth: 1965/10/29

## 2020-02-05 ENCOUNTER — Encounter: Payer: Self-pay | Admitting: Physical Therapy

## 2020-02-05 ENCOUNTER — Ambulatory Visit: Payer: 59 | Admitting: Physical Therapy

## 2020-02-05 ENCOUNTER — Other Ambulatory Visit: Payer: Self-pay

## 2020-02-05 DIAGNOSIS — M6281 Muscle weakness (generalized): Secondary | ICD-10-CM

## 2020-02-05 DIAGNOSIS — M542 Cervicalgia: Secondary | ICD-10-CM

## 2020-02-05 DIAGNOSIS — M546 Pain in thoracic spine: Secondary | ICD-10-CM

## 2020-02-05 NOTE — Therapy (Signed)
[place], [place], [address] Phone: [phone]   Fax:  [phone]  Physical Therapy Treatment  Patient Details  Name: Rebecca Fleming MRN: [id number] Date of Birth: 1966-07-21 Referring Provider (PT): Basil Dess   Encounter Date: 02/05/2020  PT End of Session - 02/05/20 1613    Visit Number  2    Date for PT Re-Evaluation  03/30/20    PT Start Time  [id number]    PT Stop Time  1620    PT Time Calculation (min)  50 min    Activity Tolerance  Patient tolerated treatment well    Behavior During Therapy  [facility] for tasks assessed/performed       Past Medical History:  Diagnosis Date  . Abnormal uterine bleeding   . Allergy   . Anemia   . Arthritis   . Asthma    exercised induced asthma - rarely uses inhaler;none since Chemo.  . Blood transfusion without reported diagnosis 1976   1976  following surgery at [facility]   . Cancer ([place]) 08/2008   left breast--has bilateral mastectomy  . Complication of anesthesia    pt states" gets shakes" with general  . Cyst near tailbone   . Elevated LDL cholesterol level   . Environmental and seasonal allergies   . Fibroid   . Headache    sleeps , no meds  . Heart murmur    never has caused any problems  . Helicobacter pylori infection   . Hemorrhoids    hx of  . History of breast cancer   . History of shingles    03/07/18  . Hypotension    hx of  . Post-operative nausea and vomiting   . RA (rheumatoid arthritis) ([place])   . RA (rheumatoid arthritis) ([place])   . Scoliosis    tx with robaxin prn - rarely uses  . SVD (spontaneous vaginal delivery) 1996   x 1    Past Surgical History:  Procedure Laterality Date  . ABDOMINAL HYSTERECTOMY    . APPENDECTOMY    . BREAST LUMPECTOMY  05/17/2008   x 2 re excise for margin - Dr Margot Chimes, bilateral mastectomy 08/2008  . BREAST LUMPECTOMY W/ NEEDLE LOCALIZATION  04/23/2008   w SLN Dr Margot Chimes  . CESAREAN SECTION   01/01/2004   Dr Quincy Simmonds  . COLONOSCOPY  03/2018  . COLONOSCOPY  05/05/2019  . ENDOMETRIAL BIOPSY  06/2013, 2015   x 2  . EYE SURGERY  2003   bilateral lasik  . FOOT SURGERY    . HYSTEROSCOPY WITH D & C  07/21/2011   Procedure: DILATATION AND CURETTAGE (D&C) /HYSTEROSCOPY;  Surgeon: Arloa Koh;  Location: [place] [facility];  Service: Gynecology;  Laterality: N/A;  with Ultrasound Guidance  . INSERTION OF TISSUE EXPANDER AFTER MASTECTOMY  09/11/2008   Dr Harlow Mares  . KNEE ARTHROSCOPY W/ MENISCAL REPAIR Right 2010   --Dr. Telford Nab  . LAPAROSCOPIC ASSISTED VAGINAL HYSTERECTOMY N/A 01/15/2015   Procedure: LAPAROSCOPIC ASSISTED VAGINAL HYSTERECTOMY;  Surgeon: Nunzio Cobbs, MD;  Location: [place] [facility];  Service: Gynecology;  Laterality: N/A;  . left foot surgery  2004  . left rotator cuff repair  01/2006  . MASTECTOMY  09/11/2008   bilateral - Dr Margot Chimes  . PORT-A-CATH REMOVAL  09/09/2009   Dr Margot Chimes  . PORTACATH PLACEMENT  05/17/2008   Dr Cori Razor rounds of chemo  . REMOVAL OF TISSUE EXPANDER AND PLACEMENT OF IMPLANT  12/2008  .  RHINOPLASTY    . SALPINGOOPHORECTOMY Bilateral 01/15/2015   Procedure: BILATERAL SALPINGO OOPHORECTOMY;  Surgeon: Nunzio Cobbs, MD;  Location: [place] [facility];  Service: Gynecology;  Laterality: Bilateral;  . UMBILICAL HERNIA REPAIR  09/09/2009   Dr Margot Chimes  . UPPER GI ENDOSCOPY  03/2018  . WISDOM TOOTH EXTRACTION      There were no vitals filed for this visit.  Subjective Assessment - 02/05/20 1532    Subjective  Pt reports that neck is feeling better overall; states that HEP is going well.    Currently in Pain?  No/denies    Pain Score  0-No pain    Pain Location  Neck    Pain Orientation  Right;Mid                        [facility] Adult PT Treatment/Exercise - 02/05/20 0001      Exercises   Exercises  Shoulder      Neck Exercises: Machines for Strengthening   UBE (Upper Arm Bike)  3 min fwd/3 min bkwd    Cybex Row  2x10 25#    Lat Pull  2x10  25#      Neck Exercises: Theraband   Shoulder Extension  10 reps    Shoulder Extension Limitations  10#      Neck Exercises: Standing   Neck Retraction  10 reps;3 secs    Wall Push Ups  10 reps      Shoulder Exercises: ROM/Strengthening   Ball on Wall  x10, 5 sec hold      Modalities   Modalities  Electrical Stimulation;Moist Heat      Moist Heat Therapy   Number Minutes Moist Heat  15 Minutes    Moist Heat Location  Cervical      Electrical Stimulation   Electrical Stimulation Location  Cervical     Electrical Stimulation Action  IFC    Electrical Stimulation Parameters  supine    Electrical Stimulation Goals  Pain      Manual Therapy   Manual Therapy  Soft tissue mobilization    Soft tissue mobilization  STM to B UT and suboccipitals               PT Short Term Goals - 01/29/20 1047      PT SHORT TERM GOAL #1   Title  Pt will be independent with HEP        PT Long Term Goals - 01/29/20 1259      PT LONG TERM GOAL #1   Title  Pt will demonstrate cervical rotation and SB bilat WFL with no reports of pain    Baseline  55 deg rotation, 25 deg side bending    Time  8    Period  Weeks    Target Date  03/25/20      PT LONG TERM GOAL #2   Title  Pt will report ability to read >1hr with no increase in cervical pain    Baseline  ~20 min    Time  8    Period  Weeks    Status  New    Target Date  03/25/20      PT LONG TERM GOAL #3   Title  Pt will reduce cervical pain by 50%    Time  8    Status  New    Target Date  03/25/20      PT LONG TERM GOAL #4   Title  Pt will report ability to sleep through night with no instances of cervical pain    Time  8    Period  Weeks    Status  New    Target Date  03/25/20            Plan - 02/05/20 1614    Clinical Impression Statement  Pt tolerated progression to TE well; no complaints of increased cervical pain during exercise. Pt noted some feelings of tightness/soreness during STM. Continue to progress  next rx.    PT Treatment/Interventions  ADLs/Self Care Home Management;Electrical Stimulation;Ultrasound;Traction;Moist Heat;Iontophoresis 4mg /ml Dexamethasone;Functional mobility training;Therapeutic activities;Therapeutic exercise;Neuromuscular re-education;Manual techniques;Patient/family education;Passive range of motion;Dry needling;Taping    PT Next Visit Plan  Initiate UE/cervical flexibility and strengthening, review HEP, manual/modalities as indicated    PT Home Exercise Plan  UT stretch, levator stretch, scapular retraction with red TB, cervical retraction, open book thoracic rotation, child's pose    Consulted and Agree with Plan of Care  Patient       Patient will benefit from skilled therapeutic intervention in order to improve the following deficits and impairments:  Decreased range of motion, Increased muscle spasms, Impaired UE functional use, Decreased activity tolerance, Pain, Impaired flexibility, Improper body mechanics, Decreased strength, Postural dysfunction  Visit Diagnosis: Muscle weakness (generalized)  Cervicalgia  Neck pain on right side  Pain in thoracic spine     Problem List Patient Active Problem List   Diagnosis Date Noted  . Primary osteoarthritis of both knees 09/01/2017  . Scoliosis 09/01/2017  . High risk medication use 11/11/2016  . Primary osteoarthritis of both hands 11/11/2016  . Lateral epicondylitis, right elbow 11/11/2016  . Myopia with astigmatism and presbyopia, bilateral 09/09/2016  . 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 Status post laparoscopic assisted vaginal hysterectomy (LAVH) 01/15/2015  . Breast cancer, left breast ([place]) 11/07/2014  . Postmenopausal bleeding 07/13/2013  . Rheumatoid arthritis ([place]) 01/11/2012  . Psoriasis 01/11/2012   Amador Cunas, PT, DPT Donald Prose Chamia Schmutz 02/05/2020, 4:15 PM  [address] [place], [place],  [address] Phone: [phone]   Fax:  [phone]  Name: ALARA BUCKLEY MRN: [id number] Date of Birth: 26-Aug-1966

## 2020-02-08 ENCOUNTER — Other Ambulatory Visit: Payer: Self-pay

## 2020-02-08 ENCOUNTER — Ambulatory Visit: Payer: 59 | Admitting: Physical Therapy

## 2020-02-08 ENCOUNTER — Encounter: Payer: Self-pay | Admitting: Physical Therapy

## 2020-02-08 DIAGNOSIS — M6281 Muscle weakness (generalized): Secondary | ICD-10-CM

## 2020-02-08 DIAGNOSIS — M546 Pain in thoracic spine: Secondary | ICD-10-CM

## 2020-02-08 DIAGNOSIS — M542 Cervicalgia: Secondary | ICD-10-CM

## 2020-02-08 NOTE — Patient Instructions (Signed)

## 2020-02-08 NOTE — Therapy (Signed)
[place], [place], [address] Phone: [phone]   Fax:  [phone]  Physical Therapy Treatment  Patient Details  Name: Rebecca Fleming MRN: [id number] Date of Birth: 05/05/1966 Referring Provider (PT): Basil Dess   Encounter Date: 02/08/2020  PT End of Session - 02/08/20 1139    Visit Number  3    Date for PT Re-Evaluation  03/30/20    PT Start Time  1100    PT Stop Time  1145    PT Time Calculation (min)  45 min    Activity Tolerance  Patient tolerated treatment well    Behavior During Therapy  [facility] for tasks assessed/performed       Past Medical History:  Diagnosis Date  . Abnormal uterine bleeding   . Allergy   . Anemia   . Arthritis   . Asthma    exercised induced asthma - rarely uses inhaler;none since Chemo.  . Blood transfusion without reported diagnosis 1976   1976  following surgery at [facility]   . Cancer ([place]) 08/2008   left breast--has bilateral mastectomy  . Complication of anesthesia    pt states" gets shakes" with general  . Cyst near tailbone   . Elevated LDL cholesterol level   . Environmental and seasonal allergies   . Fibroid   . Headache    sleeps , no meds  . Heart murmur    never has caused any problems  . Helicobacter pylori infection   . Hemorrhoids    hx of  . History of breast cancer   . History of shingles    03/07/18  . Hypotension    hx of  . Post-operative nausea and vomiting   . RA (rheumatoid arthritis) ([place])   . RA (rheumatoid arthritis) ([place])   . Scoliosis    tx with robaxin prn - rarely uses  . SVD (spontaneous vaginal delivery) 1996   x 1    Past Surgical History:  Procedure Laterality Date  . ABDOMINAL HYSTERECTOMY    . APPENDECTOMY    . BREAST LUMPECTOMY  05/17/2008   x 2 re excise for margin - Dr Margot Chimes, bilateral mastectomy 08/2008  . BREAST LUMPECTOMY W/ NEEDLE LOCALIZATION  04/23/2008   w SLN Dr Margot Chimes  . CESAREAN SECTION   01/01/2004   Dr Quincy Simmonds  . COLONOSCOPY  03/2018  . COLONOSCOPY  05/05/2019  . ENDOMETRIAL BIOPSY  06/2013, 2015   x 2  . EYE SURGERY  2003   bilateral lasik  . FOOT SURGERY    . HYSTEROSCOPY WITH D & C  07/21/2011   Procedure: DILATATION AND CURETTAGE (D&C) /HYSTEROSCOPY;  Surgeon: Arloa Koh;  Location: [place] [facility];  Service: Gynecology;  Laterality: N/A;  with Ultrasound Guidance  . INSERTION OF TISSUE EXPANDER AFTER MASTECTOMY  09/11/2008   Dr Harlow Mares  . KNEE ARTHROSCOPY W/ MENISCAL REPAIR Right 2010   --Dr. Telford Nab  . LAPAROSCOPIC ASSISTED VAGINAL HYSTERECTOMY N/A 01/15/2015   Procedure: LAPAROSCOPIC ASSISTED VAGINAL HYSTERECTOMY;  Surgeon: Nunzio Cobbs, MD;  Location: [place] [facility];  Service: Gynecology;  Laterality: N/A;  . left foot surgery  2004  . left rotator cuff repair  01/2006  . MASTECTOMY  09/11/2008   bilateral - Dr Margot Chimes  . PORT-A-CATH REMOVAL  09/09/2009   Dr Margot Chimes  . PORTACATH PLACEMENT  05/17/2008   Dr Cori Razor rounds of chemo  . REMOVAL OF TISSUE EXPANDER AND PLACEMENT OF IMPLANT  12/2008  .  RHINOPLASTY    . SALPINGOOPHORECTOMY Bilateral 01/15/2015   Procedure: BILATERAL SALPINGO OOPHORECTOMY;  Surgeon: Nunzio Cobbs, MD;  Location: [place] [facility];  Service: Gynecology;  Laterality: Bilateral;  . UMBILICAL HERNIA REPAIR  09/09/2009   Dr Margot Chimes  . UPPER GI ENDOSCOPY  03/2018  . WISDOM TOOTH EXTRACTION      There were no vitals filed for this visit.  Subjective Assessment - 02/08/20 1109    Subjective  Pt reports that neck was a little sore after last rx; feels good overall. Pt is interested in DN.    Currently in Pain?  No/denies    Pain Score  0-No pain    Pain Location  Neck    Pain Orientation  Right;Mid                        [facility] Adult PT Treatment/Exercise - 02/08/20 0001      Neck Exercises: Machines for Strengthening   UBE (Upper Arm Bike)  3 min fwd/3 min bkwd    Cybex Row  2x10 25#    Lat Pull  2x10 35#    Other  Machines for Strengthening  6# dumbells shoulder abduction/flexion 2x10    Other Machines for Strengthening  8# dumbbells standing overhead press      Neck Exercises: Theraband   Shoulder Extension  10 reps    Shoulder Extension Limitations  10#      Neck Exercises: Standing   Neck Retraction  10 reps;3 secs    Other Standing Exercises  standing raise overhead with yellow weighted ball 2x10      Manual Therapy   Manual Therapy  Soft tissue mobilization    Soft tissue mobilization  STM to B UT and suboccipitals             PT Education - 02/08/20 1139    Education Details  Pt educated on DN risks and benefits    Person(s) Educated  Patient    Methods  Explanation;Handout    Comprehension  Verbalized understanding       PT Short Term Goals - 01/29/20 1047      PT SHORT TERM GOAL #1   Title  Pt will be independent with HEP        PT Long Term Goals - 01/29/20 1259      PT LONG TERM GOAL #1   Title  Pt will demonstrate cervical rotation and SB bilat WFL with no reports of pain    Baseline  55 deg rotation, 25 deg side bending    Time  8    Period  Weeks    Target Date  03/25/20      PT LONG TERM GOAL #2   Title  Pt will report ability to read >1hr with no increase in cervical pain    Baseline  ~20 min    Time  8    Period  Weeks    Status  New    Target Date  03/25/20      PT LONG TERM GOAL #3   Title  Pt will reduce cervical pain by 50%    Time  8    Status  New    Target Date  03/25/20      PT LONG TERM GOAL #4   Title  Pt will report ability to sleep through night with no instances of cervical pain    Time  8    Period  Weeks  Status  New    Target Date  03/25/20            Plan - 02/08/20 1140    Clinical Impression Statement  Pt tolerated progression of TE with no complaints of increased cervical pain during exercise. Pt demonstrates tenderness to palpation in B UT; responded well to DN and STM. Assess response to DN next rx.    PT  Treatment/Interventions  ADLs/Self Care Home Management;Electrical Stimulation;Ultrasound;Traction;Moist Heat;Iontophoresis 4mg /ml Dexamethasone;Functional mobility training;Therapeutic activities;Therapeutic exercise;Neuromuscular re-education;Manual techniques;Patient/family education;Passive range of motion;Dry needling;Taping    PT Next Visit Plan  Progress UE/cervical flexibility and strengthening, manual/modalities as indicated    PT Home Exercise Plan  UT stretch, levator stretch, scapular retraction with red TB, cervical retraction, open book thoracic rotation, child's pose    Consulted and Agree with Plan of Care  Patient       Patient will benefit from skilled therapeutic intervention in order to improve the following deficits and impairments:  Decreased range of motion, Increased muscle spasms, Impaired UE functional use, Decreased activity tolerance, Pain, Impaired flexibility, Improper body mechanics, Decreased strength, Postural dysfunction  Visit Diagnosis: Muscle weakness (generalized)  Cervicalgia  Neck pain on right side  Pain in thoracic spine  Got a lot of LTR's with the left upper trap, not much with the right.   Problem List Patient Active Problem List   Diagnosis Date Noted  . Primary osteoarthritis of both knees 09/01/2017  . Scoliosis 09/01/2017  . High risk medication use 11/11/2016  . Primary osteoarthritis of both hands 11/11/2016  . Lateral epicondylitis, right elbow 11/11/2016  . Myopia with astigmatism and presbyopia, bilateral 09/09/2016  . 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 Status post laparoscopic assisted vaginal hysterectomy (LAVH) 01/15/2015  . Breast cancer, left breast ([place]) 11/07/2014  . Postmenopausal bleeding 07/13/2013  . Rheumatoid arthritis ([place]) 01/11/2012  . Psoriasis 01/11/2012   Amador Cunas, PT, DPT Donald Prose Solara Goodchild 02/08/2020, 11:41 AM  [address] [place], [place], [address] Phone: [phone]   Fax:  [phone]  Name: JOLYNNE TERSIGNI MRN: [id number] Date of Birth: 01/13/1966

## 2020-02-12 ENCOUNTER — Encounter: Payer: Self-pay | Admitting: Physical Therapy

## 2020-02-12 ENCOUNTER — Other Ambulatory Visit: Payer: Self-pay

## 2020-02-12 ENCOUNTER — Ambulatory Visit: Payer: 59 | Admitting: Physical Therapy

## 2020-02-12 DIAGNOSIS — M6281 Muscle weakness (generalized): Secondary | ICD-10-CM

## 2020-02-12 DIAGNOSIS — M542 Cervicalgia: Secondary | ICD-10-CM

## 2020-02-12 DIAGNOSIS — M546 Pain in thoracic spine: Secondary | ICD-10-CM

## 2020-02-12 NOTE — Therapy (Signed)
[place], [place], [address] Phone: [phone]   Fax:  [phone]  Physical Therapy Treatment  Patient Details  Name: Rebecca Fleming MRN: [id number] Date of Birth: 09-17-1966 Referring Provider (PT): Basil Dess   Encounter Date: 02/12/2020  PT End of Session - 02/12/20 1019    Visit Number  4    Date for PT Re-Evaluation  03/30/20    PT Start Time  0930    PT Stop Time  1025    PT Time Calculation (min)  55 min    Activity Tolerance  Patient tolerated treatment well    Behavior During Therapy  [facility] for tasks assessed/performed       Past Medical History:  Diagnosis Date  . Abnormal uterine bleeding   . Allergy   . Anemia   . Arthritis   . Asthma    exercised induced asthma - rarely uses inhaler;none since Chemo.  . Blood transfusion without reported diagnosis 1976   1976  following surgery at [facility]   . Cancer ([place]) 08/2008   left breast--has bilateral mastectomy  . Complication of anesthesia    pt states" gets shakes" with general  . Cyst near tailbone   . Elevated LDL cholesterol level   . Environmental and seasonal allergies   . Fibroid   . Headache    sleeps , no meds  . Heart murmur    never has caused any problems  . Helicobacter pylori infection   . Hemorrhoids    hx of  . History of breast cancer   . History of shingles    03/07/18  . Hypotension    hx of  . Post-operative nausea and vomiting   . RA (rheumatoid arthritis) ([place])   . RA (rheumatoid arthritis) ([place])   . Scoliosis    tx with robaxin prn - rarely uses  . SVD (spontaneous vaginal delivery) 1996   x 1    Past Surgical History:  Procedure Laterality Date  . ABDOMINAL HYSTERECTOMY    . APPENDECTOMY    . BREAST LUMPECTOMY  05/17/2008   x 2 re excise for margin - Dr Margot Chimes, bilateral mastectomy 08/2008  . BREAST LUMPECTOMY W/ NEEDLE LOCALIZATION  04/23/2008   w SLN Dr Margot Chimes  . CESAREAN SECTION   01/01/2004   Dr Quincy Simmonds  . COLONOSCOPY  03/2018  . COLONOSCOPY  05/05/2019  . ENDOMETRIAL BIOPSY  06/2013, 2015   x 2  . EYE SURGERY  2003   bilateral lasik  . FOOT SURGERY    . HYSTEROSCOPY WITH D & C  07/21/2011   Procedure: DILATATION AND CURETTAGE (D&C) /HYSTEROSCOPY;  Surgeon: Arloa Koh;  Location: [place] [facility];  Service: Gynecology;  Laterality: N/A;  with Ultrasound Guidance  . INSERTION OF TISSUE EXPANDER AFTER MASTECTOMY  09/11/2008   Dr Harlow Mares  . KNEE ARTHROSCOPY W/ MENISCAL REPAIR Right 2010   --Dr. Telford Nab  . LAPAROSCOPIC ASSISTED VAGINAL HYSTERECTOMY N/A 01/15/2015   Procedure: LAPAROSCOPIC ASSISTED VAGINAL HYSTERECTOMY;  Surgeon: Nunzio Cobbs, MD;  Location: [facility] [facility];  Service: Gynecology;  Laterality: N/A;  . left foot surgery  2004  . left rotator cuff repair  01/2006  . MASTECTOMY  09/11/2008   bilateral - Dr Margot Chimes  . PORT-A-CATH REMOVAL  09/09/2009   Dr Margot Chimes  . PORTACATH PLACEMENT  05/17/2008   Dr Cori Razor rounds of chemo  . REMOVAL OF TISSUE EXPANDER AND PLACEMENT OF IMPLANT  12/2008  .  RHINOPLASTY    . SALPINGOOPHORECTOMY Bilateral 01/15/2015   Procedure: BILATERAL SALPINGO OOPHORECTOMY;  Surgeon: Nunzio Cobbs, MD;  Location: [facility];  Service: Gynecology;  Laterality: Bilateral;  . UMBILICAL HERNIA REPAIR  09/09/2009   Dr Margot Chimes  . UPPER GI ENDOSCOPY  03/2018  . WISDOM TOOTH EXTRACTION      There were no vitals filed for this visit.  Subjective Assessment - 02/12/20 0931    Subjective  Pt reports that neck is feeling much better overall; does not want to try DN again.    Patient Stated Goals  prevent pain/radiating pain from flaring up again    Currently in Pain?  No/denies    Pain Score  0-No pain    Pain Location  Neck    Pain Orientation  Right                        [facility] Adult PT Treatment/Exercise - 02/12/20 0001      Neck Exercises: Machines for Strengthening   UBE (Upper Arm Bike)  3 min fwd/3 min bkwd     Cybex Row  2x10 25#    Cybex Chest Press  15# 2x10    Lat Pull  2x10 35#    Other Machines for Strengthening  6# dumbells shoulder abduction/flexion 2x10    Other Machines for Strengthening  6# dumbells standing overhead press      Neck Exercises: Theraband   Shoulder Extension  20 reps    Shoulder Extension Limitations  15#      Neck Exercises: Standing   Other Standing Exercises  wall slides flex/abd 6# B 1x10      Moist Heat Therapy   Number Minutes Moist Heat  15 Minutes    Moist Heat Location  Cervical      Electrical Stimulation   Electrical Stimulation Location  Cervical     Electrical Stimulation Action  IFC    Electrical Stimulation Parameters  seated    Electrical Stimulation Goals  Pain      Manual Therapy   Manual Therapy  Soft tissue mobilization    Soft tissue mobilization  STM to B UT and suboccipitals               PT Short Term Goals - 01/29/20 1047      PT SHORT TERM GOAL #1   Title  Pt will be independent with HEP        PT Long Term Goals - 01/29/20 1259      PT LONG TERM GOAL #1   Title  Pt will demonstrate cervical rotation and SB bilat WFL with no reports of pain    Baseline  55 deg rotation, 25 deg side bending    Time  8    Period  Weeks    Target Date  03/25/20      PT LONG TERM GOAL #2   Title  Pt will report ability to read >1hr with no increase in cervical pain    Baseline  ~20 min    Time  8    Period  Weeks    Status  New    Target Date  03/25/20      PT LONG TERM GOAL #3   Title  Pt will reduce cervical pain by 50%    Time  8    Status  New    Target Date  03/25/20      PT LONG TERM GOAL #  4   Title  Pt will report ability to sleep through night with no instances of cervical pain    Time  8    Period  Weeks    Status  New    Target Date  03/25/20            Plan - 02/12/20 1020    Clinical Impression Statement  Pt doing well with progession of TE; no complaints of increased cervical pain during exercise.  Pt still tender to palpation in B UT; responded well to STM. Pt reports that she did not like DN. Continue to progress strengthening next rx.    PT Treatment/Interventions  ADLs/Self Care Home Management;Electrical Stimulation;Ultrasound;Traction;Moist Heat;Iontophoresis 4mg /ml Dexamethasone;Functional mobility training;Therapeutic activities;Therapeutic exercise;Neuromuscular re-education;Manual techniques;Patient/family education;Passive range of motion;Dry needling;Taping    PT Next Visit Plan  Progress UE/cervical flexibility and strengthening, manual/modalities as indicated    PT Home Exercise Plan  UT stretch, levator stretch, scapular retraction with red TB, cervical retraction, open book thoracic rotation, child's pose    Consulted and Agree with Plan of Care  Patient       Patient will benefit from skilled therapeutic intervention in order to improve the following deficits and impairments:  Decreased range of motion, Increased muscle spasms, Impaired UE functional use, Decreased activity tolerance, Pain, Impaired flexibility, Improper body mechanics, Decreased strength, Postural dysfunction  Visit Diagnosis: Muscle weakness (generalized)  Cervicalgia  Neck pain on right side  Pain in thoracic spine     Problem List Patient Active Problem List   Diagnosis Date Noted  . Primary osteoarthritis of both knees 09/01/2017  . Scoliosis 09/01/2017  . High risk medication use 11/11/2016  . Primary osteoarthritis of both hands 11/11/2016  . Lateral epicondylitis, right elbow 11/11/2016  . Myopia with astigmatism and presbyopia, bilateral 09/09/2016  . 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 Status post laparoscopic assisted vaginal hysterectomy (LAVH) 01/15/2015  . Breast cancer, left breast ([place]) 11/07/2014  . Postmenopausal bleeding 07/13/2013  . Rheumatoid arthritis ([place]) 01/11/2012  . Psoriasis 01/11/2012   Amador Cunas, PT, DPT Donald Prose Driana Dazey 02/12/2020,  10:24 AM  [place] [address] [place], [place], [address] Phone: [phone]   Fax:  [phone]  Name: Rebecca Fleming MRN: [id number] Date of Birth: October 13, 1965

## 2020-02-14 ENCOUNTER — Encounter: Payer: Self-pay | Admitting: Physical Therapy

## 2020-02-14 ENCOUNTER — Other Ambulatory Visit: Payer: Self-pay

## 2020-02-14 ENCOUNTER — Ambulatory Visit: Payer: 59 | Admitting: Physical Therapy

## 2020-02-14 DIAGNOSIS — M542 Cervicalgia: Secondary | ICD-10-CM

## 2020-02-14 DIAGNOSIS — M546 Pain in thoracic spine: Secondary | ICD-10-CM

## 2020-02-14 DIAGNOSIS — M6281 Muscle weakness (generalized): Secondary | ICD-10-CM

## 2020-02-14 NOTE — Therapy (Signed)
[place], [place], [address] Phone: [phone]   Fax:  [phone]  Physical Therapy Treatment  Patient Details  Name: Rebecca Fleming MRN: [id number] Date of Birth: 03/21/1966 Referring Provider (PT): Basil Dess   Encounter Date: 02/14/2020  PT End of Session - 02/14/20 1142    Visit Number  5    Date for PT Re-Evaluation  03/30/20    PT Start Time  1100    PT Stop Time  1155    PT Time Calculation (min)  55 min    Activity Tolerance  Patient tolerated treatment well    Behavior During Therapy  [facility] for tasks assessed/performed       Past Medical History:  Diagnosis Date  . Abnormal uterine bleeding   . Allergy   . Anemia   . Arthritis   . Asthma    exercised induced asthma - rarely uses inhaler;none since Chemo.  . Blood transfusion without reported diagnosis 1976   1976  following surgery at [facility]   . Cancer ([place]) 08/2008   left breast--has bilateral mastectomy  . Complication of anesthesia    pt states" gets shakes" with general  . Cyst near tailbone   . Elevated LDL cholesterol level   . Environmental and seasonal allergies   . Fibroid   . Headache    sleeps , no meds  . Heart murmur    never has caused any problems  . Helicobacter pylori infection   . Hemorrhoids    hx of  . History of breast cancer   . History of shingles    03/07/18  . Hypotension    hx of  . Post-operative nausea and vomiting   . RA (rheumatoid arthritis) ([place])   . RA (rheumatoid arthritis) ([place])   . Scoliosis    tx with robaxin prn - rarely uses  . SVD (spontaneous vaginal delivery) 1996   x 1    Past Surgical History:  Procedure Laterality Date  . ABDOMINAL HYSTERECTOMY    . APPENDECTOMY    . BREAST LUMPECTOMY  05/17/2008   x 2 re excise for margin - Dr Margot Chimes, bilateral mastectomy 08/2008  . BREAST LUMPECTOMY W/ NEEDLE LOCALIZATION  04/23/2008   w SLN Dr Margot Chimes  . CESAREAN SECTION   01/01/2004   Dr Quincy Simmonds  . COLONOSCOPY  03/2018  . COLONOSCOPY  05/05/2019  . ENDOMETRIAL BIOPSY  06/2013, 2015   x 2  . EYE SURGERY  2003   bilateral lasik  . FOOT SURGERY    . HYSTEROSCOPY WITH D & C  07/21/2011   Procedure: DILATATION AND CURETTAGE (D&C) /HYSTEROSCOPY;  Surgeon: Arloa Koh;  Location: [facility];  Service: Gynecology;  Laterality: N/A;  with Ultrasound Guidance  . INSERTION OF TISSUE EXPANDER AFTER MASTECTOMY  09/11/2008   Dr Harlow Mares  . KNEE ARTHROSCOPY W/ MENISCAL REPAIR Right 2010   --Dr. Telford Nab  . LAPAROSCOPIC ASSISTED VAGINAL HYSTERECTOMY N/A 01/15/2015   Procedure: LAPAROSCOPIC ASSISTED VAGINAL HYSTERECTOMY;  Surgeon: Nunzio Cobbs, MD;  Location: [facility];  Service: Gynecology;  Laterality: N/A;  . left foot surgery  2004  . left rotator cuff repair  01/2006  . MASTECTOMY  09/11/2008   bilateral - Dr Margot Chimes  . PORT-A-CATH REMOVAL  09/09/2009   Dr Margot Chimes  . PORTACATH PLACEMENT  05/17/2008   Dr Cori Razor rounds of chemo  . REMOVAL OF TISSUE EXPANDER AND PLACEMENT OF IMPLANT  12/2008  .  RHINOPLASTY    . SALPINGOOPHORECTOMY Bilateral 01/15/2015   Procedure: BILATERAL SALPINGO OOPHORECTOMY;  Surgeon: Nunzio Cobbs, MD;  Location: [facility];  Service: Gynecology;  Laterality: Bilateral;  . UMBILICAL HERNIA REPAIR  09/09/2009   Dr Margot Chimes  . UPPER GI ENDOSCOPY  03/2018  . WISDOM TOOTH EXTRACTION      There were no vitals filed for this visit.  Subjective Assessment - 02/14/20 1108    Subjective  Pt reports no neck pain today just mild tightness in UT    Currently in Pain?  No/denies    Pain Score  0-No pain    Pain Location  Neck    Pain Orientation  Right                        [facility] Adult PT Treatment/Exercise - 02/14/20 0001      Neck Exercises: Machines for Strengthening   UBE (Upper Arm Bike)  3 min fwd/3 min bkwd    Cybex Row  2x10 25#    Cybex Chest Press  15# 2x10    Lat Pull  2x10 35#    Power Tower  5# cable flys  2x10    Other Machines for Strengthening  6# dumbells shoulder abduction/flexion 2x10    Other Machines for Strengthening  6# dumbells standing overhead press      Neck Exercises: Theraband   Shoulder Extension  20 reps    Shoulder Extension Limitations  15#      Moist Heat Therapy   Number Minutes Moist Heat  12 Minutes    Moist Heat Location  Cervical      Electrical Stimulation   Electrical Stimulation Location  Cervical     Electrical Stimulation Action  IFC    Electrical Stimulation Parameters  supine    Electrical Stimulation Goals  Pain      Manual Therapy   Manual Therapy  Soft tissue mobilization    Soft tissue mobilization  STM to B UT and suboccipitals               PT Short Term Goals - 01/29/20 1047      PT SHORT TERM GOAL #1   Title  Pt will be independent with HEP        PT Long Term Goals - 01/29/20 1259      PT LONG TERM GOAL #1   Title  Pt will demonstrate cervical rotation and SB bilat WFL with no reports of pain    Baseline  55 deg rotation, 25 deg side bending    Time  8    Period  Weeks    Target Date  03/25/20      PT LONG TERM GOAL #2   Title  Pt will report ability to read >1hr with no increase in cervical pain    Baseline  ~20 min    Time  8    Period  Weeks    Status  New    Target Date  03/25/20      PT LONG TERM GOAL #3   Title  Pt will reduce cervical pain by 50%    Time  8    Status  New    Target Date  03/25/20      PT LONG TERM GOAL #4   Title  Pt will report ability to sleep through night with no instances of cervical pain    Time  8    Period  Weeks    Status  New    Target Date  03/25/20            Plan - 02/14/20 1143    Clinical Impression Statement  Pt continues to report reduced pain and tolerates progression of TE well with no complaints of increased cervical pain during exercise. Pt point tender mid muscle belly of B UT R>L; improved from previous sessions. Responds well to [facility]. Continue to  progress strengthening; potential d/c at last scheduled visit.    PT Treatment/Interventions  ADLs/Self Care Home Management;Electrical Stimulation;Ultrasound;Traction;Moist Heat;Iontophoresis 4mg /ml Dexamethasone;Functional mobility training;Therapeutic activities;Therapeutic exercise;Neuromuscular re-education;Manual techniques;Patient/family education;Passive range of motion;Dry needling;Taping    PT Next Visit Plan  Progress UE/cervical flexibility and strengthening, manual/modalities as indicated    Consulted and Agree with Plan of Care  Patient       Patient will benefit from skilled therapeutic intervention in order to improve the following deficits and impairments:  Decreased range of motion, Increased muscle spasms, Impaired UE functional use, Decreased activity tolerance, Pain, Impaired flexibility, Improper body mechanics, Decreased strength, Postural dysfunction  Visit Diagnosis: Muscle weakness (generalized)  Cervicalgia  Neck pain on right side  Pain in thoracic spine     Problem List Patient Active Problem List   Diagnosis Date Noted  . Primary osteoarthritis of both knees 09/01/2017  . Scoliosis 09/01/2017  . High risk medication use 11/11/2016  . Primary osteoarthritis of both hands 11/11/2016  . Lateral epicondylitis, right elbow 11/11/2016  . Myopia with astigmatism and presbyopia, bilateral 09/09/2016  . 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 Status post laparoscopic assisted vaginal hysterectomy (LAVH) 01/15/2015  . Breast cancer, left breast ([place]) 11/07/2014  . Postmenopausal bleeding 07/13/2013  . Rheumatoid arthritis ([place]) 01/11/2012  . Psoriasis 01/11/2012   Amador Cunas, PT, DPT Donald Prose Sayana Salley 02/14/2020, 11:48 AM  [place], [place], [address] Phone: [phone]   Fax:  [phone]  Name: Rebecca Fleming MRN: [id number] Date of Birth:  [birth year]

## 2020-02-15 ENCOUNTER — Other Ambulatory Visit: Payer: Self-pay | Admitting: Gastroenterology

## 2020-02-22 ENCOUNTER — Ambulatory Visit: Payer: 59 | Admitting: Physical Therapy

## 2020-03-04 ENCOUNTER — Encounter: Payer: 59 | Admitting: Physical Therapy

## 2020-03-06 ENCOUNTER — Ambulatory Visit: Payer: 59 | Admitting: Specialist

## 2020-03-06 ENCOUNTER — Other Ambulatory Visit: Payer: Self-pay

## 2020-03-06 ENCOUNTER — Encounter: Payer: Self-pay | Admitting: Specialist

## 2020-03-06 VITALS — BP 100/57 | HR 60 | Ht 67.0 in | Wt 163.0 lb

## 2020-03-06 DIAGNOSIS — M542 Cervicalgia: Secondary | ICD-10-CM

## 2020-03-06 DIAGNOSIS — M47812 Spondylosis without myelopathy or radiculopathy, cervical region: Secondary | ICD-10-CM

## 2020-03-06 DIAGNOSIS — M5136 Other intervertebral disc degeneration, lumbar region: Secondary | ICD-10-CM

## 2020-03-06 DIAGNOSIS — M4726 Other spondylosis with radiculopathy, lumbar region: Secondary | ICD-10-CM

## 2020-03-06 DIAGNOSIS — M4105 Infantile idiopathic scoliosis, thoracolumbar region: Secondary | ICD-10-CM | POA: Diagnosis not present

## 2020-03-06 DIAGNOSIS — M51369 Other intervertebral disc degeneration, lumbar region without mention of lumbar back pain or lower extremity pain: Secondary | ICD-10-CM

## 2020-03-06 MED ORDER — METHYLPREDNISOLONE 4 MG PO TBPK
ORAL_TABLET | ORAL | 0 refills | Status: DC
Start: 2020-03-06 — End: 2020-04-11

## 2020-03-06 NOTE — Patient Instructions (Signed)
Avoid frequent bending and stooping  No lifting greater than 10 lbs. May use ice or moist heat for pain. Weight loss is of benefit. Best medication for lumbar disc disease is arthritis medications like motrin, celebrex and naprosyn. Exercise is important to improve your indurance and does allow people to function better inspite of back pain.  Hold on voltaren while taking steroid dose pak. Start gabapentin at night for pain.

## 2020-03-06 NOTE — Progress Notes (Signed)
Office Visit Note   Patient: Rebecca Fleming           Date of Birth: 1966/06/23           MRN: [id number] Visit Date: 03/06/2020              Requested by: Nunzio Cobbs, MD [place],  [place] [address] PCP: Nunzio Cobbs, MD   Assessment & Plan: Visit Diagnoses:  1. Infantile idiopathic scoliosis of thoracolumbar region   2. Degenerative disc disease, lumbar   3. Spondylosis without myelopathy or radiculopathy, cervical region   4. Cervicalgia   5. Other spondylosis with radiculopathy, lumbar region     Plan: Avoid frequent bending and stooping  No lifting greater than 10 lbs. May use ice or moist heat for pain. Weight loss is of benefit. Best medication for lumbar disc disease is arthritis medications like motrin, celebrex and naprosyn. Exercise is important to improve your indurance and does allow people to function better inspite of back pain.  Hold on voltaren while taking steroid dose pak. Start gabapentin at night for pain.  Follow-Up Instructions: No follow-ups on file.   Orders:  No orders of the defined types were placed in this encounter.  No orders of the defined types were placed in this encounter.     Procedures: No procedures performed   Clinical Data: No additional findings.   Subjective: Chief Complaint  Patient presents with  . Neck - Follow-up, Pain    54 year old female with history of cervicalgia, she has been to PT and the therapy seemed to help the neck discomfort. She has had acute Onset of back and right upper buttock pain with radiation down the right right lateral thigh and calf. No bowel or bladder difficullty.  Pain began when she was lean forward over bathroom sink. 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    Review of Systems  Constitutional: Negative.   HENT: Negative.   Eyes: Negative.   Respiratory: Negative.   Cardiovascular: Negative.    Gastrointestinal: Negative.   Endocrine: Negative.   Genitourinary: Negative.   Musculoskeletal: Negative.   Skin: Negative.   Allergic/Immunologic: Negative.   Neurological: Negative.   Hematological: Negative.   Psychiatric/Behavioral: Negative.      Objective: Vital Signs: BP (!) 100/57 (BP Location: Left Arm, Patient Position: Sitting)   Pulse 60   Ht 5\' 7"  (1.702 m)   Wt 163 lb (73.9 kg)   LMP 11/12/2014   BMI 25.53 kg/m   Physical Exam Constitutional:      Appearance: She is well-developed.  HENT:     Head: Normocephalic and atraumatic.  Eyes:     Pupils: Pupils are equal, round, and reactive to light.  Pulmonary:     Effort: Pulmonary effort is normal.     Breath sounds: Normal breath sounds.  Abdominal:     General: Bowel sounds are normal.     Palpations: Abdomen is soft.  Musculoskeletal:     Cervical back: Normal range of motion and neck supple.     Lumbar back: Negative right straight leg raise test and negative left straight leg raise test.  Skin:    General: Skin is warm and dry.  Neurological:     Mental Status: She is alert and oriented to person, place, and time.  Psychiatric:        Behavior: Behavior normal.  Thought Content: Thought content normal.        Judgment: Judgment normal.     Back Exam   Range of Motion  Extension: abnormal  Flexion: abnormal  Lateral bend right: abnormal  Lateral bend left: abnormal  Rotation right: abnormal  Rotation left: abnormal   Muscle Strength  Right Quadriceps:  5/5  Left Quadriceps:  5/5  Right Hamstrings:  5/5  Left Hamstrings:  5/5   Tests  Straight leg raise right: negative Straight leg raise left: negative  Reflexes  Patellar: 2/4 Achilles: 2/4 Babinski's sign: normal   Other  Toe walk: normal Heel walk: normal Sensation: normal Gait: normal  Erythema: no back redness Scars: absent  Comments:  Right EHL weakness 4-/5      Specialty Comments:  No specialty  comments available.  Imaging: No results found.   PMFS History: Patient Active Problem List   Diagnosis Date Noted  . Primary osteoarthritis of both knees 09/01/2017  . Scoliosis 09/01/2017  . High risk medication use 11/11/2016  . Primary osteoarthritis of both hands 11/11/2016  . Lateral epicondylitis, right elbow 11/11/2016  . Myopia with astigmatism and presbyopia, bilateral 09/09/2016  . 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 Status post laparoscopic assisted vaginal hysterectomy (LAVH) 01/15/2015  . Breast cancer, left breast ([place]) 11/07/2014  . Postmenopausal bleeding 07/13/2013  . Rheumatoid arthritis ([place]) 01/11/2012  . Psoriasis 01/11/2012   Past Medical History:  Diagnosis Date  . Abnormal uterine bleeding   . Allergy   . Anemia   . Arthritis   . Asthma    exercised induced asthma - rarely uses inhaler;none since Chemo.  . Blood transfusion without reported diagnosis 1976   1976  following surgery at [facility]   . Cancer ([place]) 08/2008   left breast--has bilateral mastectomy  . Complication of anesthesia    pt states" gets shakes" with general  . Cyst near tailbone   . Elevated LDL cholesterol level   . Environmental and seasonal allergies   . Fibroid   . Headache    sleeps , no meds  . Heart murmur    never has caused any problems  . Helicobacter pylori infection   . Hemorrhoids    hx of  . History of breast cancer   . History of shingles    03/07/18  . Hypotension    hx of  . Post-operative nausea and vomiting   . RA (rheumatoid arthritis) ([place])   . RA (rheumatoid arthritis) ([place])   . Scoliosis    tx with robaxin prn - rarely uses  . SVD (spontaneous vaginal delivery) 1996   x 1    Family History  Problem Relation Age of Onset  . Breast cancer Mother   . Diabetes Mother   . Hypertension Mother   . Stroke Mother   . Irritable bowel syndrome Mother   . Kidney disease Mother   . Heart failure Father   . Hypertension Father     . Colon cancer Neg Hx   . Colon polyps Neg Hx   . Esophageal cancer Neg Hx   . Pancreatic cancer Neg Hx   . Rectal cancer Neg Hx   . Stomach cancer Neg Hx     Past Surgical History:  Procedure Laterality Date  . ABDOMINAL HYSTERECTOMY    . APPENDECTOMY    . BREAST LUMPECTOMY  05/17/2008   x 2 re excise for margin - Dr Margot Chimes, bilateral mastectomy 08/2008  . BREAST LUMPECTOMY  W/ NEEDLE LOCALIZATION  04/23/2008   w SLN Dr Margot Chimes  . CESAREAN SECTION  01/01/2004   Dr Quincy Simmonds  . COLONOSCOPY  03/2018  . COLONOSCOPY  05/05/2019  . ENDOMETRIAL BIOPSY  06/2013, 2015   x 2  . EYE SURGERY  2003   bilateral lasik  . FOOT SURGERY    . HYSTEROSCOPY WITH D & C  07/21/2011   Procedure: DILATATION AND CURETTAGE (D&C) /HYSTEROSCOPY;  Surgeon: Arloa Koh;  Location: [facility];  Service: Gynecology;  Laterality: N/A;  with Ultrasound Guidance  . INSERTION OF TISSUE EXPANDER AFTER MASTECTOMY  09/11/2008   Dr Harlow Mares  . KNEE ARTHROSCOPY W/ MENISCAL REPAIR Right 2010   --Dr. Telford Nab  . LAPAROSCOPIC ASSISTED VAGINAL HYSTERECTOMY N/A 01/15/2015   Procedure: LAPAROSCOPIC ASSISTED VAGINAL HYSTERECTOMY;  Surgeon: Nunzio Cobbs, MD;  Location: [facility];  Service: Gynecology;  Laterality: N/A;  . left foot surgery  2004  . left rotator cuff repair  01/2006  . MASTECTOMY  09/11/2008   bilateral - Dr Margot Chimes  . PORT-A-CATH REMOVAL  09/09/2009   Dr Margot Chimes  . PORTACATH PLACEMENT  05/17/2008   Dr Cori Razor rounds of chemo  . REMOVAL OF TISSUE EXPANDER AND PLACEMENT OF IMPLANT  12/2008  . RHINOPLASTY    . SALPINGOOPHORECTOMY Bilateral 01/15/2015   Procedure: BILATERAL SALPINGO OOPHORECTOMY;  Surgeon: Nunzio Cobbs, MD;  Location: [facility];  Service: Gynecology;  Laterality: Bilateral;  . UMBILICAL HERNIA REPAIR  09/09/2009   Dr Margot Chimes  . UPPER GI ENDOSCOPY  03/2018  . WISDOM TOOTH EXTRACTION     Social History   Occupational History  . Not on file  Tobacco Use  . Smoking status: Never  Smoker  . Smokeless tobacco: Never Used  Vaping Use  . Vaping Use: Never used  Substance and Sexual Activity  . Alcohol use: Yes    Alcohol/week: 2.0 standard drinks    Types: 2 Cans of beer per week    Comment: occ glass of wine or beer  . Drug use: No  . Sexual activity: Yes    Partners: Male    Birth control/protection: Post-menopausal    Comment: Hyst/BSO

## 2020-03-07 ENCOUNTER — Ambulatory Visit: Payer: 59 | Admitting: Physical Therapy

## 2020-03-26 ENCOUNTER — Emergency Department (HOSPITAL_BASED_OUTPATIENT_CLINIC_OR_DEPARTMENT_OTHER)
Admission: EM | Admit: 2020-03-26 | Discharge: 2020-03-26 | Disposition: A | Discharge status: Home / Self Care | Payer: 59 | Attending: Emergency Medicine | Admitting: Emergency Medicine

## 2020-03-26 ENCOUNTER — Encounter (HOSPITAL_BASED_OUTPATIENT_CLINIC_OR_DEPARTMENT_OTHER): Payer: Self-pay | Admitting: *Deleted

## 2020-03-26 ENCOUNTER — Other Ambulatory Visit: Payer: Self-pay

## 2020-03-26 ENCOUNTER — Emergency Department (HOSPITAL_BASED_OUTPATIENT_CLINIC_OR_DEPARTMENT_OTHER): Payer: 59

## 2020-03-26 DIAGNOSIS — Y92838 Other recreation area as the place of occurrence of the external cause: Secondary | ICD-10-CM | POA: Diagnosis not present

## 2020-03-26 DIAGNOSIS — W458XXA Other foreign body or object entering through skin, initial encounter: Secondary | ICD-10-CM | POA: Insufficient documentation

## 2020-03-26 DIAGNOSIS — C50912 Malignant neoplasm of unspecified site of left female breast: Secondary | ICD-10-CM | POA: Diagnosis not present

## 2020-03-26 DIAGNOSIS — Y999 Unspecified external cause status: Secondary | ICD-10-CM | POA: Diagnosis not present

## 2020-03-26 DIAGNOSIS — Z91040 Latex allergy status: Secondary | ICD-10-CM | POA: Insufficient documentation

## 2020-03-26 DIAGNOSIS — I959 Hypotension, unspecified: Secondary | ICD-10-CM | POA: Diagnosis not present

## 2020-03-26 DIAGNOSIS — Y9341 Activity, dancing: Secondary | ICD-10-CM | POA: Insufficient documentation

## 2020-03-26 DIAGNOSIS — S90851A Superficial foreign body, right foot, initial encounter: Secondary | ICD-10-CM | POA: Diagnosis not present

## 2020-03-26 DIAGNOSIS — J45909 Unspecified asthma, uncomplicated: Secondary | ICD-10-CM | POA: Insufficient documentation

## 2020-03-26 MED ORDER — DICLOFENAC SODIUM 1 % EX GEL: 2.0000 g | Freq: Four times a day (QID) | CUTANEOUS | 2 refills | Status: DC

## 2020-03-26 MED ORDER — LIDOCAINE 5 % EX OINT
1.0000 | TOPICAL_OINTMENT | CUTANEOUS | 0 refills | Status: DC | PRN
Start: 2020-03-26 — End: 2020-04-11

## 2020-03-26 NOTE — Progress Notes (Deleted)
Office Visit Note  Patient: Rebecca Fleming             Date of Birth: Feb 16, 1966           MRN: [id number]             PCP: Nunzio Cobbs, MD Referring: Aundria Rud* Visit Date: 04/04/2020 Occupation: @GUAROCC @  Subjective:  No chief complaint on file.   History of Present Illness: Rebecca Fleming is a 54 y.o. female ***   Activities of Daily Living:  Patient reports morning stiffness for *** {minute/hour:19697}.   Patient {ACTIONS;DENIES/REPORTS:21021675::"Denies"} nocturnal pain.  Difficulty dressing/grooming: {ACTIONS;DENIES/REPORTS:21021675::"Denies"} Difficulty climbing stairs: {ACTIONS;DENIES/REPORTS:21021675::"Denies"} Difficulty getting out of chair: {ACTIONS;DENIES/REPORTS:21021675::"Denies"} Difficulty using hands for taps, buttons, cutlery, and/or writing: {ACTIONS;DENIES/REPORTS:21021675::"Denies"}  No Rheumatology ROS completed.   PMFS History:  Patient Active Problem List   Diagnosis Date Noted  . Primary osteoarthritis of both knees 09/01/2017  . Scoliosis 09/01/2017  . High risk medication use 11/11/2016  . Primary osteoarthritis of both hands 11/11/2016  . Lateral epicondylitis, right elbow 11/11/2016  . Myopia with astigmatism and presbyopia, bilateral 09/09/2016  . 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 Status post laparoscopic assisted vaginal hysterectomy (LAVH) 01/15/2015  . Breast cancer, left breast ([place]) 11/07/2014  . Postmenopausal bleeding 07/13/2013  . Rheumatoid arthritis ([place]) 01/11/2012  . Psoriasis 01/11/2012    Past Medical History:  Diagnosis Date  . Abnormal uterine bleeding   . Allergy   . Anemia   . Arthritis   . Asthma    exercised induced asthma - rarely uses inhaler;none since Chemo.  . Blood transfusion without reported diagnosis 1976   1976  following surgery at [facility]   . Cancer ([place]) 08/2008   left breast--has bilateral mastectomy  . Complication of anesthesia    pt states"  gets shakes" with general  . Cyst near tailbone   . Elevated LDL cholesterol level   . Environmental and seasonal allergies   . Fibroid   . Headache    sleeps , no meds  . Heart murmur    never has caused any problems  . Helicobacter pylori infection   . Hemorrhoids    hx of  . History of breast cancer   . History of shingles    03/07/18  . Hypotension    hx of  . Post-operative nausea and vomiting   . RA (rheumatoid arthritis) ([place])   . RA (rheumatoid arthritis) ([place])   . Scoliosis    tx with robaxin prn - rarely uses  . SVD (spontaneous vaginal delivery) 1996   x 1    Family History  Problem Relation Age of Onset  . Breast cancer Mother   . Diabetes Mother   . Hypertension Mother   . Stroke Mother   . Irritable bowel syndrome Mother   . Kidney disease Mother   . Heart failure Father   . Hypertension Father   . Colon cancer Neg Hx   . Colon polyps Neg Hx   . Esophageal cancer Neg Hx   . Pancreatic cancer Neg Hx   . Rectal cancer Neg Hx   . Stomach cancer Neg Hx    Past Surgical History:  Procedure Laterality Date  . ABDOMINAL HYSTERECTOMY    . APPENDECTOMY    . BREAST LUMPECTOMY  05/17/2008   x 2 re excise for margin - Dr Margot Chimes, bilateral mastectomy 08/2008  . BREAST LUMPECTOMY W/ NEEDLE LOCALIZATION  04/23/2008  w SLN Dr Margot Chimes  . CESAREAN SECTION  01/01/2004   Dr Quincy Simmonds  . COLONOSCOPY  03/2018  . COLONOSCOPY  05/05/2019  . ENDOMETRIAL BIOPSY  06/2013, 2015   x 2  . EYE SURGERY  2003   bilateral lasik  . FOOT SURGERY    . HYSTEROSCOPY WITH D & C  07/21/2011   Procedure: DILATATION AND CURETTAGE (D&C) /HYSTEROSCOPY;  Surgeon: Arloa Koh;  Location: [facility];  Service: Gynecology;  Laterality: N/A;  with Ultrasound Guidance  . INSERTION OF TISSUE EXPANDER AFTER MASTECTOMY  09/11/2008   Dr Harlow Mares  . KNEE ARTHROSCOPY W/ MENISCAL REPAIR Right 2010   --Dr. Telford Nab  . LAPAROSCOPIC ASSISTED VAGINAL HYSTERECTOMY N/A 01/15/2015   Procedure: LAPAROSCOPIC  ASSISTED VAGINAL HYSTERECTOMY;  Surgeon: Nunzio Cobbs, MD;  Location: [facility];  Service: Gynecology;  Laterality: N/A;  . left foot surgery  2004  . left rotator cuff repair  01/2006  . MASTECTOMY  09/11/2008   bilateral - Dr Margot Chimes  . PORT-A-CATH REMOVAL  09/09/2009   Dr Margot Chimes  . PORTACATH PLACEMENT  05/17/2008   Dr Cori Razor rounds of chemo  . REMOVAL OF TISSUE EXPANDER AND PLACEMENT OF IMPLANT  12/2008  . RHINOPLASTY    . SALPINGOOPHORECTOMY Bilateral 01/15/2015   Procedure: BILATERAL SALPINGO OOPHORECTOMY;  Surgeon: Nunzio Cobbs, MD;  Location: [facility];  Service: Gynecology;  Laterality: Bilateral;  . UMBILICAL HERNIA REPAIR  09/09/2009   Dr Margot Chimes  . UPPER GI ENDOSCOPY  03/2018  . WISDOM TOOTH EXTRACTION     Social History   Social History Narrative  . Not on file   Immunization History  Administered Date(s) Administered  . PFIZER SARS-COV-2 Vaccination 10/05/2019, 10/25/2019  . Pneumococcal Polysaccharide-23 01/16/2015  . Tdap 09/21/2010     Objective: Vital Signs: LMP 11/12/2014    Physical Exam   Musculoskeletal Exam: ***  CDAI Exam: CDAI Score: -- Patient Global: --; Provider Global: -- Swollen: --; Tender: -- Joint Exam 04/04/2020   No joint exam has been documented for this visit   There is currently no information documented on the homunculus. Go to the Rheumatology activity and complete the homunculus joint exam.  Investigation: No additional findings.  Imaging: No results found.  Recent Labs: Lab Results  Component Value Date   WBC 4.1 11/08/2019   HGB 14.6 11/08/2019   PLT 193 11/08/2019   NA 143 11/08/2019   K 4.8 11/08/2019   CL 105 11/08/2019   CO2 31 11/08/2019   GLUCOSE 62 (L) 11/08/2019   BUN 13 11/08/2019   CREATININE 0.88 11/08/2019   BILITOT 0.7 11/08/2019   ALKPHOS 66 03/31/2017   AST 20 11/08/2019   ALT 18 11/08/2019   PROT 6.4 11/08/2019   ALBUMIN 4.1 03/31/2017   CALCIUM 9.9 11/08/2019   GFRAA 86  11/08/2019    Speciality Comments: PLQ Eye Exam: 09/28/2019 WNL @ [facility]. Follow up in 1 year. (in [place])  Procedures:  No procedures performed Allergies: Latex, Other, and Percocet [oxycodone-acetaminophen]   Assessment / Plan:     Visit Diagnoses: No diagnosis found.  Orders: No orders of the defined types were placed in this encounter.  No orders of the defined types were placed in this encounter.   Face-to-face time spent with patient was *** minutes. Greater than 50% of time was spent in counseling and coordination of care.  Follow-Up Instructions: No follow-ups on file.   Earnestine Mealing, CMA  Note - This  record has been created using [employer].  Chart creation errors have been sought, but may not always  have been located. Such creation errors do not reflect on  the standard of medical care.

## 2020-03-26 NOTE — Discharge Instructions (Signed)
Soak the foot twice daily in warm water.  May add Epsom salts to the warm water bath for wound management.  May use ibuprofen, naproxen, or Tylenol for pain.  Diclofenac gel: This is a topical anti-inflammatory medication and can be applied directly to the painful region.  Do not use on the face or genitals.  This medication may be used as an alternative to oral anti-inflammatory medications, such as ibuprofen or naproxen.  Lidocaine, topical: May apply the lidocaine, as needed, up to 4 times daily to the area of pain.  Follow-up: Follow-up with the podiatry specialist.  Call the number provided to set up an appointment.  Return: Return to the emergency department for fever, swelling to the area, spreading redness, significantly increased pain, or any other major concerns.

## 2020-03-26 NOTE — ED Triage Notes (Signed)
c/o glass to bottom of  Right foot x 4 days

## 2020-03-26 NOTE — ED Provider Notes (Signed)
[facility]PART[facility] Provider Note   CSN: [id number] Arrival date & time: 03/26/20  1617     History Chief Complaint  Patient presents with  . Foreign Body in [place] is a 54 y.o. female.  HPI      Rebecca Fleming is a 54 y.o. female, with a history of anemia, asthma, presenting to the ED with concern for foreign body in the bottom of the right foot. Patient states she was dancing at a wedding barefoot on July 3 and felt as though she stepped on something.  She continued to have pain in the region and is concerned about a foreign body.  She suspects she may have stepped on glass. Tetanus vaccination up-to-date. Denies numbness, swelling, discharge from the wound.  Past Medical History:  Diagnosis Date  . Abnormal uterine bleeding   . Allergy   . Anemia   . Arthritis   . Asthma    exercised induced asthma - rarely uses inhaler;none since Chemo.  . Blood transfusion without reported diagnosis 1976   1976  following surgery at [facility]   . Cancer ([place]) 08/2008   left breast--has bilateral mastectomy  . Complication of anesthesia    pt states" gets shakes" with general  . Cyst near tailbone   . Elevated LDL cholesterol level   . Environmental and seasonal allergies   . Fibroid   . Headache    sleeps , no meds  . Heart murmur    never has caused any problems  . Helicobacter pylori infection   . Hemorrhoids    hx of  . History of breast cancer   . History of shingles    03/07/18  . Hypotension    hx of  . Post-operative nausea and vomiting   . RA (rheumatoid arthritis) ([place])   . RA (rheumatoid arthritis) ([place])   . Scoliosis    tx with robaxin prn - rarely uses  . SVD (spontaneous vaginal delivery) 1996   x 1    Patient Active Problem List   Diagnosis Date Noted  . Primary osteoarthritis of both knees 09/01/2017  . Scoliosis 09/01/2017  . High risk medication use 11/11/2016  . Primary osteoarthritis of both hands 11/11/2016  .  Lateral epicondylitis, right elbow 11/11/2016  . Myopia with astigmatism and presbyopia, bilateral 09/09/2016  . 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 Status post laparoscopic assisted vaginal hysterectomy ([facility]) 01/15/2015  . Breast cancer, left breast ([place]) 11/07/2014  . Postmenopausal bleeding 07/13/2013  . Rheumatoid arthritis ([place]) 01/11/2012  . Psoriasis 01/11/2012    Past Surgical History:  Procedure Laterality Date  . ABDOMINAL HYSTERECTOMY    . APPENDECTOMY    . BREAST LUMPECTOMY  05/17/2008   x 2 re excise for margin - Dr Margot Chimes, bilateral mastectomy 08/2008  . BREAST LUMPECTOMY W/ NEEDLE LOCALIZATION  04/23/2008   w [facility] Dr Margot Chimes  . CESAREAN SECTION  01/01/2004   Dr Quincy Simmonds  . COLONOSCOPY  03/2018  . COLONOSCOPY  05/05/2019  . ENDOMETRIAL BIOPSY  06/2013, 2015   x 2  . EYE SURGERY  2003   bilateral lasik  . FOOT SURGERY    . HYSTEROSCOPY WITH D & C  07/21/2011   Procedure: DILATATION AND CURETTAGE (D&C) /HYSTEROSCOPY;  Surgeon: Arloa Koh;  Location: [facility];  Service: Gynecology;  Laterality: N/A;  with Ultrasound Guidance  . INSERTION OF TISSUE EXPANDER AFTER MASTECTOMY  09/11/2008   Dr  Harlow Mares  . KNEE ARTHROSCOPY W/ MENISCAL REPAIR Right 2010   --Dr. Telford Nab  . LAPAROSCOPIC ASSISTED VAGINAL HYSTERECTOMY N/A 01/15/2015   Procedure: LAPAROSCOPIC ASSISTED VAGINAL HYSTERECTOMY;  Surgeon: Nunzio Cobbs, MD;  Location: [facility];  Service: Gynecology;  Laterality: N/A;  . left foot surgery  2004  . left rotator cuff repair  01/2006  . MASTECTOMY  09/11/2008   bilateral - Dr Margot Chimes  . PORT-A-CATH REMOVAL  09/09/2009   Dr Margot Chimes  . PORTACATH PLACEMENT  05/17/2008   Dr Cori Razor rounds of chemo  . REMOVAL OF TISSUE EXPANDER AND PLACEMENT OF IMPLANT  12/2008  . RHINOPLASTY    . SALPINGOOPHORECTOMY Bilateral 01/15/2015   Procedure: BILATERAL SALPINGO OOPHORECTOMY;  Surgeon: Nunzio Cobbs, MD;  Location: [facility];  Service: Gynecology;   Laterality: Bilateral;  . UMBILICAL HERNIA REPAIR  09/09/2009   Dr Margot Chimes  . UPPER GI ENDOSCOPY  03/2018  . WISDOM TOOTH EXTRACTION       OB History    Gravida  2   Para  2   Term      Preterm      AB      Living  3     SAB      TAB      Ectopic      Multiple  1   Live Births  3        Obstetric Comments  Pt was a surrogate for the twins        Family History  Problem Relation Age of Onset  . Breast cancer Mother   . Diabetes Mother   . Hypertension Mother   . Stroke Mother   . Irritable bowel syndrome Mother   . Kidney disease Mother   . Heart failure Father   . Hypertension Father   . Colon cancer Neg Hx   . Colon polyps Neg Hx   . Esophageal cancer Neg Hx   . Pancreatic cancer Neg Hx   . Rectal cancer Neg Hx   . Stomach cancer Neg Hx     Social History   Tobacco Use  . Smoking status: Never Smoker  . Smokeless tobacco: Never Used  Vaping Use  . Vaping Use: Never used  Substance Use Topics  . Alcohol use: Yes    Alcohol/week: 2.0 standard drinks    Types: 2 Cans of beer per week    Comment: occ glass of wine or beer  . Drug use: No    Home Medications Prior to Admission medications   Medication Sig Start Date End Date Taking? Authorizing Provider  calcium carbonate (OS-CAL) 600 MG TABS tablet Take 600 mg by mouth 2 (two) times daily with a meal.    [provider]  cetirizine (ZYRTEC) 10 MG tablet Take 10 mg by mouth daily.      [provider]  diclofenac (VOLTAREN) 50 MG EC tablet Take 1 tablet (50 mg total) by mouth daily after breakfast for 7 days, THEN 1 tablet (50 mg total) 2 (two) times daily after a meal for 7 days. 01/17/20 01/31/20  Jessy Oto, MD  diclofenac Sodium (VOLTAREN) 1 % GEL Apply 2 g topically 4 (four) times daily. 03/26/20   Isley Weisheit C, PA-C  folic acid (FOLVITE) 1 MG tablet TAKE 2 TABLETS BY MOUTH EVERY DAY 01/22/20   Bo Merino, MD  gabapentin (NEURONTIN) 100 MG capsule Take 1 capsule  (100 mg total) by mouth at bedtime. 01/17/20  Jessy Oto, MD  GLUCOSAMINE PO Take 2 tablets by mouth daily.     [provider]  hydroxychloroquine (PLAQUENIL) 200 MG tablet TAKE 1 TABLET BY MOUTH TWICE A DAY 01/04/20   Bo Merino, MD  ibuprofen (ADVIL) 200 MG tablet Take 200 mg by mouth as needed.    [provider]  lidocaine (XYLOCAINE) 5 % ointment Apply 1 application topically as needed. 03/26/20   Tyleigh Mahn C, PA-C  Magnesium 500 MG TABS Take 1 tablet by mouth 2 (two) times daily.    [provider]  Manganese 50 MG TABS Take 1 tablet by mouth daily.    [provider]  methocarbamol (ROBAXIN) 500 MG tablet Take 1 tablet (500 mg total) by mouth every 8 (eight) hours as needed for muscle spasms. 01/17/20   Jessy Oto, MD  methylPREDNISolone (MEDROL DOSEPAK) 4 MG TBPK tablet Take 6 day dose pak as directed. 03/06/20   Jessy Oto, MD  mometasone (NASONEX) 50 MCG/ACT nasal spray Place 2 sprays into the nose as needed.     [provider]  pantoprazole (PROTONIX) 40 MG tablet TAKE 1 TABLET BY MOUTH EVERY DAY 02/16/20   Ladene Artist, MD  Plant Sterols and Stanols (CHOLESTOFF PO) Take by mouth daily.     [provider]    Allergies    Latex, Other, and Percocet [oxycodone-acetaminophen]  Review of Systems   Review of Systems  Skin: Positive for wound.  Neurological: Negative for numbness.    Physical Exam Updated Vital Signs BP 123/85 (BP Location: Right Arm)   Pulse 64   Temp 97.7 F (36.5 C) (Oral)   Resp 16   Ht 5\' 7"  (1.702 m)   Wt 73.9 kg   LMP 11/12/2014   SpO2 97%   BMI 25.53 kg/m   Physical Exam Vitals and nursing note reviewed.  Constitutional:      General: She is not in acute distress.    Appearance: She is well-developed. She is not diaphoretic.  HENT:     Head: Normocephalic and atraumatic.  Eyes:     Conjunctiva/sclera: Conjunctivae normal.  Cardiovascular:     Rate and Rhythm: Normal  rate and regular rhythm.  Pulmonary:     Effort: Pulmonary effort is normal.  Musculoskeletal:     Cervical back: Neck supple.     Comments: Small, perhaps 2 mm wound to the distal plantar surface of the right foot.   No real tenderness in the region.  No drainage, swelling, erythema in the area of the wound or in the surrounding area. Full range of motion in the right toes without pain or noted difficulty.  Skin:    General: Skin is warm and dry.     Capillary Refill: Capillary refill takes less than 2 seconds.     Coloration: Skin is not pale.  Neurological:     Mental Status: She is alert.     Comments: Sensation light touch grossly intact in the right foot and toes. Appropriate motor function intact in the right toes.  Psychiatric:        Behavior: Behavior normal.     ED Results / Procedures / Treatments   Labs (all labs ordered are listed, but only abnormal results are displayed) Labs Reviewed - No data to display  EKG None  Radiology DG Foot Complete Right  Result Date: 03/26/2020 CLINICAL DATA:  Possible foreign body between third and fourth toes EXAM: RIGHT FOOT COMPLETE - 3+ VIEW  COMPARISON:  None. FINDINGS: No fracture or malalignment. Moderate plantar calcaneal spur. Punctate 2 mm suspected foreign body between the heads of the third and fourth metatarsals. IMPRESSION: Punctate 2 mm suspected foreign body between the heads of the third and fourth metatarsals. Electronically Signed   By: Donavan Foil M.D.   On: 03/26/2020 16:54    Procedures Procedures (including critical care time)  Medications Ordered in ED Medications - No data to display  ED Course  I have reviewed the triage vital signs and the nursing notes.  Pertinent labs & imaging results that were available during my care of the patient were reviewed by me and considered in my medical decision making (see chart for details).    MDM Rules/Calculators/A&P                          Patient presents  with concern for foreign body in the foot.  There is evidence of foreign body on the x-ray.  Due to its size and location, the possible harm of trying to retrieve this foreign body may outweigh the benefit. Podiatry follow-up. The patient was given instructions for home care as well as return precautions. Patient voices understanding of these instructions, accepts the plan, and is comfortable with discharge.  I reviewed and interpreted the patient's radiological studies.   Findings and plan of care discussed with Fredia Sorrow, MD.   Final Clinical Impression(s) / ED Diagnoses Final diagnoses:  Foreign body in right foot, initial encounter    Rx / DC Orders ED Discharge Orders         Ordered    diclofenac Sodium (VOLTAREN) 1 % GEL  4 times daily     Discontinue  Reprint     03/26/20 1932    lidocaine (XYLOCAINE) 5 % ointment  As needed     Discontinue  Reprint    Note to Pharmacy: Or similar topical analgesic, similar lidocaine preparation like cream, etc.   03/26/20 1932           Neala Miggins, Helane Gunther, PA-C 03/30/20 0026    Fredia Sorrow, MD 03/30/20 1520

## 2020-03-27 ENCOUNTER — Ambulatory Visit: Payer: 59 | Admitting: Podiatry

## 2020-03-27 ENCOUNTER — Ambulatory Visit: Payer: 59

## 2020-03-27 ENCOUNTER — Encounter: Payer: Self-pay | Admitting: Podiatry

## 2020-03-27 DIAGNOSIS — M79671 Pain in right foot: Secondary | ICD-10-CM

## 2020-03-27 DIAGNOSIS — M795 Residual foreign body in soft tissue: Secondary | ICD-10-CM

## 2020-03-27 NOTE — Progress Notes (Signed)
Subjective:  Patient ID: Rebecca Fleming, female    DOB: Nov 27, 1965,  MRN: [id number]  No chief complaint on file.   54 y.o. female presents with the above complaint.  Patient presents with a complaint of right foreign body stepping on a glass during a wedding this past weekend.  Patient states is painful to ambulate on.  Patient was seen at urgent care and was referred here they were not able to remove the foreign body.  She denies any other acute complaints.  She would like to have it removed if possible.  She states her pain scale 7 out of 10 when ambulating.  She denies any other acute complaints.   Review of Systems: Negative except as noted in the HPI. Denies N/V/F/Ch.  Past Medical History:  Diagnosis Date  . Abnormal uterine bleeding   . Allergy   . Anemia   . Arthritis   . Asthma    exercised induced asthma - rarely uses inhaler;none since Chemo.  . Blood transfusion without reported diagnosis 1976   1976  following surgery at [facility]   . Cancer ([place]) 08/2008   left breast--has bilateral mastectomy  . Complication of anesthesia    pt states" gets shakes" with general  . Cyst near tailbone   . Elevated LDL cholesterol level   . Environmental and seasonal allergies   . Fibroid   . Headache    sleeps , no meds  . Heart murmur    never has caused any problems  . Helicobacter pylori infection   . Hemorrhoids    hx of  . History of breast cancer   . History of shingles    03/07/18  . Hypotension    hx of  . Post-operative nausea and vomiting   . RA (rheumatoid arthritis) ([place])   . RA (rheumatoid arthritis) ([place])   . Scoliosis    tx with robaxin prn - rarely uses  . SVD (spontaneous vaginal delivery) 1996   x 1    Current Outpatient Medications:  .  calcium carbonate (OS-CAL) 600 MG TABS tablet, Take 600 mg by mouth 2 (two) times daily with a meal., Disp: , Rfl:  .  cetirizine (ZYRTEC) 10 MG tablet, Take 10 mg by mouth daily.  , Disp: , Rfl:  .  diclofenac (VOLTAREN)  50 MG EC tablet, Take 1 tablet (50 mg total) by mouth daily after breakfast for 7 days, THEN 1 tablet (50 mg total) 2 (two) times daily after a meal for 7 days., Disp: 60 tablet, Rfl: 3 .  diclofenac Sodium (VOLTAREN) 1 % GEL, Apply 2 g topically 4 (four) times daily., Disp: 409 g, Rfl: 2 .  folic acid (FOLVITE) 1 MG tablet, TAKE 2 TABLETS BY MOUTH EVERY DAY, Disp: 180 tablet, Rfl: 2 .  gabapentin (NEURONTIN) 100 MG capsule, Take 1 capsule (100 mg total) by mouth at bedtime., Disp: 30 capsule, Rfl: 3 .  GLUCOSAMINE PO, Take 2 tablets by mouth daily. , Disp: , Rfl:  .  hydroxychloroquine (PLAQUENIL) 200 MG tablet, TAKE 1 TABLET BY MOUTH TWICE A DAY, Disp: 180 tablet, Rfl: 0 .  ibuprofen (ADVIL) 200 MG tablet, Take 200 mg by mouth as needed., Disp: , Rfl:  .  lidocaine (XYLOCAINE) 5 % ointment, Apply 1 application topically as needed., Disp: 35.44 g, Rfl: 0 .  Magnesium 500 MG TABS, Take 1 tablet by mouth 2 (two) times daily., Disp: , Rfl:  .  Manganese 50 MG TABS, Take 1 tablet by mouth  daily., Disp: , Rfl:  .  methocarbamol (ROBAXIN) 500 MG tablet, Take 1 tablet (500 mg total) by mouth every 8 (eight) hours as needed for muscle spasms., Disp: 30 tablet, Rfl: 1 .  methylPREDNISolone (MEDROL DOSEPAK) 4 MG TBPK tablet, Take 6 day dose pak as directed., Disp: 21 tablet, Rfl: 0 .  mometasone (NASONEX) 50 MCG/ACT nasal spray, Place 2 sprays into the nose as needed. , Disp: , Rfl:  .  pantoprazole (PROTONIX) 40 MG tablet, TAKE 1 TABLET BY MOUTH EVERY DAY, Disp: 90 tablet, Rfl: 0 .  Plant Sterols and Stanols (CHOLESTOFF PO), Take by mouth daily. , Disp: , Rfl:   Social History   Tobacco Use  Smoking Status Never Smoker  Smokeless Tobacco Never Used    Allergies  Allergen Reactions  . Latex Other (See Comments)    stuffiness  . Other Swelling    Patient is allergic to Yellow Dye #5  . Percocet [Oxycodone-Acetaminophen] Itching    Has had Vicodin- hydrocodone before without itching.    Objective:  There were no vitals filed for this visit. There is no height or weight on file to calculate BMI. Constitutional Well developed. Well nourished.  Vascular Dorsalis pedis pulses palpable bilaterally. Posterior tibial pulses palpable bilaterally. Capillary refill normal to all digits.  No cyanosis or clubbing noted. Pedal hair growth normal.  Neurologic Normal speech. Oriented to person, place, and time. Epicritic sensation to light touch grossly present bilaterally.  Dermatologic Nails well groomed and normal in appearance. No open wounds. No skin lesions.  Orthopedic:  Pain on palpation to the left submetatarsal 4 entry point/lesion.  Upon debridement with a chisel blade and a handle the foreign body was palpated and decided to be removed.  No clinical signs of infection noted.  No purulent drainage noted.   Radiographs: 3 views of skeletally mature adult left foot were reviewed that were taken at the urgent care.  Foreign body noted in the interspace third.  Able to evaluate the depth due to superimposition on the lateral views. Assessment:   1. Foot pain, right    Plan:  Patient was evaluated and treated and all questions answered.  Left plantar foot foreign body (glass). -I explained to the patient that generally there is no infiltrate foreign body if the superficial not from a be able to debride the extent and place the foreign body out in clinic however the wound will need to take her to the operating room to excise it out if it continues to cause pain.  Generally the foreign body tissue from a granuloma create a wall around the foreign body. -Using chisel blade and a handle, the hyperkeratotic lesion was debrided down to deeper tissue upon visualization foreign body was noted.  Using pickup and a dermal curette the foreign body was excised out.  The body was taken out in its complete entirety.  Upon removal patient had immediate relief.  Triple Antibiotic Band-Aid  was applied to the entry point.  No complication noted. -  No follow-ups on file.

## 2020-04-02 ENCOUNTER — Other Ambulatory Visit: Payer: Self-pay | Admitting: Rheumatology

## 2020-04-02 DIAGNOSIS — M0579 Rheumatoid arthritis with rheumatoid factor of multiple sites without organ or systems involvement: Secondary | ICD-10-CM

## 2020-04-02 NOTE — Telephone Encounter (Signed)
Last Visit: 12/15/2019 Next Visit: 04/04/2020 Labs: 11/08/2019 CBC and CMP are normal PLQ Eye Exam: 09/28/2019 WNL   Current Dose per office note on 12/15/2019: Plaquenil 200 mg 1 tablet twice daily.  Okay to refill per Dr. Estanislado Pandy

## 2020-04-04 ENCOUNTER — Ambulatory Visit: Payer: 59 | Admitting: Rheumatology

## 2020-04-10 NOTE — Progress Notes (Signed)
Office Visit Note  Patient: Rebecca Fleming             Date of Birth: 1966-03-30           MRN: [id number]             PCP: Patient, No Pcp Per Referring: Aundria Rud* Visit Date: 04/24/2020 Occupation: @GUAROCC @  Subjective:  "knot" on palm of right hand   History of Present Illness: Rebecca Fleming is a 54 y.o. female with history of seropositive rheumatoid arthritis and osteoarthritis.  Patient is taking Plaquenil 200 mg 1 tablet by mouth twice daily. She denies any recent rheumatoid arthritis flares.  She denies any joint pain or joint swelling at this time.  She states that she has noticed a knot on the palmar aspect of her right hand and has noticed some tenderness when lifting hand weights.  She denies any locking or triggering of the right ring finger.  Patient reports that she was evaluated by Dr. Louanne Skye for the neck pain she is experiencing.  She states that she had an MRI followed by physical therapy and her discomfort has improved significantly.  She has been performing home exercises.  She continues to have chronic lower back pain and stiffness.  She denies any psoriasis at this time.  She updated her bone density on 04/24/2019.  She has been taking calcium and vitamin D supplement. She denies any recent infections.  She has received both COVID-19 vaccinations.     Activities of Daily Living:  Patient reports morning stiffness for 15  minutes.   Patient Reports nocturnal pain.  Difficulty dressing/grooming: Denies Difficulty climbing stairs: Denies Difficulty getting out of chair: Denies Difficulty using hands for taps, buttons, cutlery, and/or writing: Reports  Review of Systems  Constitutional: Negative for fatigue.  HENT: Negative for mouth sores, mouth dryness and nose dryness.   Eyes: Negative for pain, itching, visual disturbance and dryness.  Respiratory: Negative for cough, hemoptysis, shortness of breath and difficulty breathing.   Cardiovascular:  Negative for chest pain, palpitations, hypertension and swelling in legs/feet.  Gastrointestinal: Negative for blood in stool, constipation and diarrhea.  Endocrine: Negative for increased urination.  Genitourinary: Negative for difficulty urinating and painful urination.  Musculoskeletal: Positive for myalgias, morning stiffness, muscle tenderness and myalgias. Negative for arthralgias, joint pain, joint swelling and muscle weakness.  Skin: Negative for color change, pallor, rash, hair loss, nodules/bumps, redness, skin tightness, ulcers and sensitivity to sunlight.  Allergic/Immunologic: Negative for susceptible to infections.  Neurological: Positive for headaches. Negative for dizziness, numbness, memory loss and weakness.  Hematological: Positive for bruising/bleeding tendency. Negative for swollen glands.  Psychiatric/Behavioral: Negative for depressed mood, confusion and sleep disturbance. The patient is not nervous/anxious.     PMFS History:  Patient Active Problem List   Diagnosis Date Noted  . Primary osteoarthritis of both knees 09/01/2017  . Scoliosis 09/01/2017  . High risk medication use 11/11/2016  . Primary osteoarthritis of both hands 11/11/2016  . Lateral epicondylitis, right elbow 11/11/2016  . Myopia with astigmatism and presbyopia, bilateral 09/09/2016  . 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 Status post laparoscopic assisted vaginal hysterectomy (LAVH) 01/15/2015  . Breast cancer, left breast ([place]) 11/07/2014  . Postmenopausal bleeding 07/13/2013  . Rheumatoid arthritis ([place]) 01/11/2012  . Psoriasis 01/11/2012    Past Medical History:  Diagnosis Date  . Abnormal uterine bleeding   . Allergy   . Anemia   . Arthritis   .  Asthma    exercised induced asthma - rarely uses inhaler;none since Chemo.  . Blood transfusion without reported diagnosis 1976   1976  following surgery at [facility]   . Cancer ([place]) 08/2008   left breast--has bilateral  mastectomy  . Complication of anesthesia    pt states" gets shakes" with general  . Cyst near tailbone   . Elevated LDL cholesterol level   . Environmental and seasonal allergies   . Fibroid   . Headache    sleeps , no meds  . Heart murmur    never has caused any problems  . Helicobacter pylori infection   . Hemorrhoids    hx of  . History of breast cancer   . History of shingles    03/07/18  . Hypotension    hx of  . Post-operative nausea and vomiting   . RA (rheumatoid arthritis) ([place])   . RA (rheumatoid arthritis) ([place])   . Scoliosis    tx with robaxin prn - rarely uses  . SVD (spontaneous vaginal delivery) 1996   x 1    Family History  Problem Relation Age of Onset  . Breast cancer Mother   . Diabetes Mother   . Hypertension Mother   . Stroke Mother   . Irritable bowel syndrome Mother   . Kidney disease Mother   . Heart failure Father   . Hypertension Father   . Colon cancer Neg Hx   . Colon polyps Neg Hx   . Esophageal cancer Neg Hx   . Pancreatic cancer Neg Hx   . Rectal cancer Neg Hx   . Stomach cancer Neg Hx    Past Surgical History:  Procedure Laterality Date  . ABDOMINAL HYSTERECTOMY    . APPENDECTOMY    . BREAST LUMPECTOMY  05/17/2008   x 2 re excise for margin - Dr Margot Chimes, bilateral mastectomy 08/2008  . BREAST LUMPECTOMY W/ NEEDLE LOCALIZATION  04/23/2008   w SLN Dr Margot Chimes  . CESAREAN SECTION  01/01/2004   Dr Quincy Simmonds  . COLONOSCOPY  03/2018  . COLONOSCOPY  05/05/2019  . ENDOMETRIAL BIOPSY  06/2013, 2015   x 2  . EYE SURGERY  2003   bilateral lasik  . FOOT SURGERY    . HYSTEROSCOPY WITH D & C  07/21/2011   Procedure: DILATATION AND CURETTAGE (D&C) /HYSTEROSCOPY;  Surgeon: Arloa Koh;  Location: [facility];  Service: Gynecology;  Laterality: N/A;  with Ultrasound Guidance  . INSERTION OF TISSUE EXPANDER AFTER MASTECTOMY  09/11/2008   Dr Harlow Mares  . KNEE ARTHROSCOPY W/ MENISCAL REPAIR Right 2010   --Dr. Telford Nab  . LAPAROSCOPIC ASSISTED VAGINAL  HYSTERECTOMY N/A 01/15/2015   Procedure: LAPAROSCOPIC ASSISTED VAGINAL HYSTERECTOMY;  Surgeon: Nunzio Cobbs, MD;  Location: [facility];  Service: Gynecology;  Laterality: N/A;  . left foot surgery  2004  . left rotator cuff repair  01/2006  . MASTECTOMY  09/11/2008   bilateral - Dr Margot Chimes  . PORT-A-CATH REMOVAL  09/09/2009   Dr Margot Chimes  . PORTACATH PLACEMENT  05/17/2008   Dr Cori Razor rounds of chemo  . REMOVAL OF TISSUE EXPANDER AND PLACEMENT OF IMPLANT  12/2008  . RHINOPLASTY    . SALPINGOOPHORECTOMY Bilateral 01/15/2015   Procedure: BILATERAL SALPINGO OOPHORECTOMY;  Surgeon: Nunzio Cobbs, MD;  Location: [facility];  Service: Gynecology;  Laterality: Bilateral;  . UMBILICAL HERNIA REPAIR  09/09/2009   Dr Margot Chimes  . UPPER GI ENDOSCOPY  03/2018  . WISDOM TOOTH EXTRACTION  Social History   Social History Narrative  . Not on file   Immunization History  Administered Date(s) Administered  . Influenza,inj,Quad PF,6+ Mos 07/12/2019  . PFIZER SARS-COV-2 Vaccination 10/05/2019, 10/25/2019  . Pneumococcal Polysaccharide-23 01/16/2015  . Tdap 09/21/2010, 07/12/2019     Objective: Vital Signs: BP 108/61 (BP Location: Right Arm, Patient Position: Sitting, Cuff Size: Normal)   Pulse 60   Resp 13   Ht 5\' 6"  (1.676 m)   Wt 164 lb 6.4 oz (74.6 kg)   LMP 11/12/2014   BMI 26.53 kg/m    Physical Exam Vitals and nursing note reviewed.  Constitutional:      Appearance: She is well-developed.  HENT:     Head: Normocephalic and atraumatic.  Eyes:     Conjunctiva/sclera: Conjunctivae normal.  Pulmonary:     Effort: Pulmonary effort is normal.  Abdominal:     General: Bowel sounds are normal.     Palpations: Abdomen is soft.  Musculoskeletal:     Cervical back: Normal range of motion.  Lymphadenopathy:     Cervical: No cervical adenopathy.  Skin:    General: Skin is warm and dry.     Capillary Refill: Capillary refill takes less than 2 seconds.  Neurological:      Mental Status: She is alert and oriented to person, place, and time.  Psychiatric:        Behavior: Behavior normal.      Musculoskeletal Exam: C-spine, thoracic spine, and lumbar spine good ROM.  Shoulder joints, elbow joints, wrist joints, MCPs, PIPs, and DIPs good ROM with no synovitis.  Complete fist formation bilaterally.  Dupuytren's contracture right fourth flexor tendon. Hip joints, knee joints, ankle joints, MTPs, PIPs, and DIPs good ROM with no synovitis.  No warmth or effusion of knee joints.  No tenderness or swelling of ankle joints.  No tenderness of MTP joints.   CDAI Exam: CDAI Score: 0.4  Patient Global: 2 mm; Provider Global: 2 mm Swollen: 0 ; Tender: 0  Joint Exam 04/24/2020   No joint exam has been documented for this visit   There is currently no information documented on the homunculus. Go to the Rheumatology activity and complete the homunculus joint exam.  Investigation: No additional findings.  Imaging: DG Foot Complete Right  Result Date: 03/26/2020 CLINICAL DATA:  Possible foreign body between third and fourth toes EXAM: RIGHT FOOT COMPLETE - 3+ VIEW COMPARISON:  None. FINDINGS: No fracture or malalignment. Moderate plantar calcaneal spur. Punctate 2 mm suspected foreign body between the heads of the third and fourth metatarsals. IMPRESSION: Punctate 2 mm suspected foreign body between the heads of the third and fourth metatarsals. Electronically Signed   By: Donavan Foil M.D.   On: 03/26/2020 16:54    Recent Labs: Lab Results  Component Value Date   WBC 5.5 04/22/2020   HGB 14.9 04/22/2020   PLT 200 04/22/2020   NA 140 04/22/2020   K 4.4 04/22/2020   CL 102 04/22/2020   CO2 31 04/22/2020   GLUCOSE 107 04/22/2020   BUN 13 04/22/2020   CREATININE 0.79 04/22/2020   BILITOT 0.6 04/22/2020   ALKPHOS 66 03/31/2017   AST 18 04/22/2020   ALT 15 04/22/2020   PROT 6.8 04/22/2020   ALBUMIN 4.1 03/31/2017   CALCIUM 10.0 04/22/2020   GFRAA 98 04/22/2020      Speciality Comments: PLQ Eye Exam: 09/28/2019 WNL @ [facility]. Follow up in 1 year. (in [place])  Procedures:  No procedures performed  Allergies: Latex, Other, and Percocet [oxycodone-acetaminophen]   Assessment / Plan:     Visit Diagnoses: Rheumatoid arthritis involving multiple sites with positive rheumatoid factor (HCC) - +RF, +CCP: She has no tenderness or synovitis on exam.  She has not had any recent rheumatoid arthritis flares.  She is clinically doing well on Plaquenil 200 mg 1 tablet by mouth twice daily.  She is tolerating Plaquenil without any side effects.  She is not experiencing any joint pain or inflammation at this time.  She has no difficulty with ADLs.  We will update x-rays of hands and feet at her next office visit to assess for radiographic progression.  She will continue to take Plaquenil as prescribed.  She updated her Plaquenil eye exam on 09/28/2019 and we will continue to monitor yearly.  CBC and CMP were within normal limits on 04/22/2020.  She will continue to return for lab work every 5 months to monitor for drug toxicity.  She was advised to notify us if she develops increased joint pain or joint swelling.  She will follow-up in the office in 5 months.  High risk medication use - Plaquenil 200 mg 1 tablet twice daily.  Eye Exam: 09/28/2019.  CBC and CMP were within normal limits on 04/22/2020.  She will be due to update lab work every 5 months to monitor for drug toxicity. She has received both COVID-19 vaccinations.  She has not had any recent infections.  We discussed that she is to notify us or her PCP if she develops COVID-19 so she can receive the Covid antibody infusion.  She is planning on receiving the booster.  Discussed the importance of wearing a mask and social distancing.   Primary osteoarthritis of both hands: She has mild PIP and DIP thickening consistent with osteoarthritis of both hands.  No tenderness or inflammation was noted.  She has  complete fist formation bilaterally.  Joint protection and muscle strengthening were discussed.  Primary osteoarthritis of both knees: She has good range of motion of both knee joints with no discomfort.  No warmth or effusion was noted.  She has no difficulty rising from a seated position or climbing steps.  Chondromalacia of left patella: She has good range of motion of the left knee joint with no discomfort.  Psoriasis: She has no active psoriasis at this time.  Osteopenia of multiple sites: DEXA on 04/24/2019 BMD measured at femur neck right is 0.823 with T score -1.5.  She is taking a calcium and vitamin D supplement.  She has not had any recent falls or fractures.  DDD (degenerative disc disease), cervical: She had a MRI of the C-spine performed on 01/09/2020 which revealed mild multilevel degenerative spondylosis and right eccentric disc osteophyte complex with prominent right-sided uncovertebral spurring at C6-C7 with resultant severe right C7 foraminal stenosis.  She was evaluated by Dr. Louanne Skye and was referred to physical therapy.  She noticed a significant improvement in her neck pain and has continued to perform home exercises.  She is not experiencing any symptoms of radiculopathy at this time.  She has good range of motion on exam with no discomfort at this time.  She occasionally takes diclofenac 50 mg 1 tablet by mouth daily as needed for pain relief.  Dupuytren's contracture of right hand: She has a new onset dupuytren's contracture of the right hand.  Different treatment options were discussed.  She uses hand weights and was encouraged to wear a glove for protection.  She was advised to  notify us if the contracture worsens and we can refer her to a hand surgeon.  Cervicalgia: She has been performing neck exercises.   Lateral epicondylitis, right elbow: Resolved.   Morton's neuroma of right foot: She is not having any discomfort at this time.   Other medical conditions are listed as  follows:  Scoliosis, unspecified scoliosis type, unspecified spinal region  Heart murmur  History of breast cancer  History of anemia  History of asthma    Orders: No orders of the defined types were placed in this encounter.  No orders of the defined types were placed in this encounter.     Follow-Up Instructions: Return in about 5 months (around 09/24/2020) for Rheumatoid arthritis, Osteoarthritis.   Ofilia Neas, PA-C  Note - This record has been created using Dragon software.  Chart creation errors have been sought, but may not always  have been located. Such creation errors do not reflect on  the standard of medical care.

## 2020-04-10 NOTE — Progress Notes (Addendum)
54 y.o. G2P2 Married Caucasian female here for annual exam.    Patient requests labs.  Feels hot at night but able to sleep.   Patient has been doing PT for ?cervical spine stenosis. Had a course of prednisone which helped. Not taking the Gabapentin for her neck pain.   She has possible ingrown hair on right vulva--getting better. Still a little tender and drained.  Hx sebaceous cyst.   Had her Covid vaccine in January, 2021.  PCP: None     Patient's last menstrual period was 11/12/2014.           Sexually active: Yes.    The current method of family planning is status post hysterectomy.    Exercising: Yes.    teaches jazzercize Smoker:  no  Health Maintenance: Pap: 08/01/14 Neg:Neg HR HPV History of abnormal Pap:  no MMG: 2009 Bil.mastectomy Colonoscopy: 05-05-19 polyps;next 04/2022 BMD: 04-24-19  Result :Osteopenia TDaP: 07-12-19 Gardasil:   no HIV:Neg in the past Hep C:Neg in the past Screening Labs:  Today.   reports that she has never smoked. She has never used smokeless tobacco. She reports current alcohol use of about 2.0 standard drinks of alcohol per week. She reports that she does not use drugs.  Past Medical History:  Diagnosis Date  . Abnormal uterine bleeding   . Allergy   . Anemia   . Arthritis   . Asthma    exercised induced asthma - rarely uses inhaler;none since Chemo.  . Blood transfusion without reported diagnosis 1976   1976  [facility] surgery at [facility]   . Cancer ([place]) 08/2008   left breast--has bilateral mastectomy  . Complication of anesthesia    pt states" gets shakes" with general  . Cyst near tailbone   . Elevated LDL cholesterol level   . Environmental and seasonal allergies   . Fibroid   . Headache    sleeps , no meds  . Heart murmur    never has caused any problems  . Helicobacter pylori infection   . Hemorrhoids    hx of  . History of breast cancer   . History of shingles    03/07/18  . Hypotension    hx of  . Post-operative  nausea and vomiting   . RA (rheumatoid arthritis) ([place])   . RA (rheumatoid arthritis) ([place])   . Scoliosis    tx with robaxin prn - rarely uses  . SVD (spontaneous vaginal delivery) 1996   x 1    Past Surgical History:  Procedure Laterality Date  . ABDOMINAL HYSTERECTOMY    . APPENDECTOMY    . BREAST LUMPECTOMY  05/17/2008   x 2 re excise for margin - Dr Margot Chimes, bilateral mastectomy 08/2008  . BREAST LUMPECTOMY W/ NEEDLE LOCALIZATION  04/23/2008   w SLN Dr Margot Chimes  . CESAREAN SECTION  01/01/2004   Dr Quincy Simmonds  . COLONOSCOPY  03/2018  . COLONOSCOPY  05/05/2019  . ENDOMETRIAL BIOPSY  06/2013, 2015   x 2  . EYE SURGERY  2003   bilateral lasik  . FOOT SURGERY    . HYSTEROSCOPY WITH D & C  07/21/2011   Procedure: DILATATION AND CURETTAGE (D&C) /HYSTEROSCOPY;  Surgeon: Arloa Koh;  Location: [facility];  Service: Gynecology;  Laterality: N/A;  with Ultrasound Guidance  . INSERTION OF TISSUE EXPANDER AFTER MASTECTOMY  09/11/2008   Dr Harlow Mares  . KNEE ARTHROSCOPY W/ MENISCAL REPAIR Right 2010   --Dr. Telford Nab  . LAPAROSCOPIC ASSISTED VAGINAL HYSTERECTOMY N/A 01/15/2015  Procedure: LAPAROSCOPIC ASSISTED VAGINAL HYSTERECTOMY;  Surgeon: Nunzio Cobbs, MD;  Location: [facility];  Service: Gynecology;  Laterality: N/A;  . left foot surgery  2004  . left rotator cuff repair  01/2006  . MASTECTOMY  09/11/2008   bilateral - Dr Margot Chimes  . PORT-A-CATH REMOVAL  09/09/2009   Dr Margot Chimes  . PORTACATH PLACEMENT  05/17/2008   Dr Cori Razor rounds of chemo  . REMOVAL OF TISSUE EXPANDER AND PLACEMENT OF IMPLANT  12/2008  . RHINOPLASTY    . SALPINGOOPHORECTOMY Bilateral 01/15/2015   Procedure: BILATERAL SALPINGO OOPHORECTOMY;  Surgeon: Nunzio Cobbs, MD;  Location: [facility];  Service: Gynecology;  Laterality: Bilateral;  . UMBILICAL HERNIA REPAIR  09/09/2009   Dr Margot Chimes  . UPPER GI ENDOSCOPY  03/2018  . WISDOM TOOTH EXTRACTION      Current Outpatient Medications  Medication Sig Dispense Refill   . calcium carbonate (OS-CAL) 600 MG TABS tablet Take 600 mg by mouth 2 (two) times daily with a meal.    . cetirizine (ZYRTEC) 10 MG tablet Take 10 mg by mouth daily.      . folic acid (FOLVITE) 1 MG tablet TAKE 2 TABLETS BY MOUTH EVERY DAY 180 tablet 2  . gabapentin (NEURONTIN) 100 MG capsule Take 1 capsule (100 mg total) by mouth at bedtime. 30 capsule 3  . GLUCOSAMINE PO Take 2 tablets by mouth daily.     . hydroxychloroquine (PLAQUENIL) 200 MG tablet TAKE 1 TABLET BY MOUTH TWICE A DAY 180 tablet 0  . ibuprofen (ADVIL) 200 MG tablet Take 200 mg by mouth as needed.    . Magnesium 500 MG TABS Take 1 tablet by mouth 2 (two) times daily.    . Manganese 50 MG TABS Take 1 tablet by mouth daily.    . methocarbamol (ROBAXIN) 500 MG tablet Take 1 tablet (500 mg total) by mouth every 8 (eight) hours as needed for muscle spasms. 30 tablet 1  . pantoprazole (PROTONIX) 40 MG tablet TAKE 1 TABLET BY MOUTH EVERY DAY 90 tablet 0  . Plant Sterols and Stanols (CHOLESTOFF PO) Take by mouth daily.     . diclofenac (VOLTAREN) 50 MG EC tablet Take 1 tablet (50 mg total) by mouth daily after breakfast for 7 days, THEN 1 tablet (50 mg total) 2 (two) times daily after a meal for 7 days. 60 tablet 3   No current facility-administered medications for this visit.    Family History  Problem Relation Age of Onset  . Breast cancer Mother   . Diabetes Mother   . Hypertension Mother   . Stroke Mother   . Irritable bowel syndrome Mother   . Kidney disease Mother   . Heart failure Father   . Hypertension Father   . Colon cancer Neg Hx   . Colon polyps Neg Hx   . Esophageal cancer Neg Hx   . Pancreatic cancer Neg Hx   . Rectal cancer Neg Hx   . Stomach cancer Neg Hx     Review of Systems  All other systems reviewed and are negative.   Exam:   BP 110/78   Pulse 60   Resp 14   Ht 5' 5.5" (1.664 m)   Wt 163 lb (73.9 kg)   LMP 11/12/2014   BMI 26.71 kg/m     General appearance: alert, cooperative and  appears stated age Head: normocephalic, without obvious abnormality, atraumatic Neck: no adenopathy, supple, symmetrical, trachea midline and thyroid normal to  inspection and palpation Lungs: clear to auscultation bilaterally Breasts: absent.  Implants present.  No axillary adenopathy Heart: regular rate and rhythm Abdomen: soft, non-tender; no masses, no organomegaly Extremities: extremities normal, atraumatic, no cyanosis or edema Skin: skin color, texture, turgor normal. No rashes or lesions Lymph nodes: cervical, supraclavicular, and axillary nodes normal. Neurologic: grossly normal  Pelvic: External genitalia:  8 mm sebaceous cyst lateral right labia majora, inferiorly.                No abnormal inguinal nodes palpated.              Urethra:  normal appearing urethra with no masses, tenderness or lesions              Bartholins and Skenes: normal                 Vagina: erythema of mucosa consistent with atrophy.              Cervix: absent              Pap taken: No. Bimanual Exam:  Uterus:  absent              Adnexa: no mass, fullness, tenderness              Rectal exam: Yes.  .  Confirms.              Anus:  normal sphincter tone, no lesions  Chaperone was present for exam.  Assessment:   Well woman visit with normal exam. Status post bilateral mastectomywith reconstruction. Status post LAVH/BSO.  Osteopenia. RA. Hx anemia with unknown etiology. Vulvar sebaceous cyst.   Plan:  Pap and HR HPV not needed. Guidelines for Calcium, Vitamin D, regular exercise program including cardiovascular and weight bearing exercise. Check lipids, vit D, and TSH.  BMD next year.  Hot compress to vulvar cyst. Follow up annually and prn.   After visit summary provided.

## 2020-04-11 ENCOUNTER — Ambulatory Visit: Payer: 59 | Admitting: Obstetrics and Gynecology

## 2020-04-11 ENCOUNTER — Other Ambulatory Visit: Payer: Self-pay

## 2020-04-11 ENCOUNTER — Encounter: Payer: Self-pay | Admitting: Obstetrics and Gynecology

## 2020-04-11 VITALS — BP 110/78 | HR 60 | Resp 14 | Ht 65.5 in | Wt 163.0 lb

## 2020-04-11 DIAGNOSIS — Z01419 Encounter for gynecological examination (general) (routine) without abnormal findings: Secondary | ICD-10-CM

## 2020-04-12 LAB — LIPID PANEL
Chol/HDL Ratio: 3.9 ratio (ref 0.0–4.4)
Cholesterol, Total: 212 mg/dL — ABNORMAL HIGH (ref 100–199)
HDL: 54 mg/dL (ref >39)
LDL Chol Calc (NIH): 140 mg/dL — ABNORMAL HIGH (ref 0–99)
Triglycerides: 101 mg/dL (ref 0–149)
VLDL Cholesterol Cal: 18 mg/dL (ref 5–40)

## 2020-04-12 LAB — TSH: TSH: 1.09 u[IU]/mL (ref 0.450–4.500)

## 2020-04-12 LAB — VITAMIN D 25 HYDROXY (VIT D DEFICIENCY, FRACTURES): Vit D, 25-Hydroxy: 43.2 ng/mL (ref 30.0–100.0)

## 2020-04-17 ENCOUNTER — Ambulatory Visit: Payer: 59 | Admitting: Specialist

## 2020-04-22 ENCOUNTER — Other Ambulatory Visit: Payer: Self-pay

## 2020-04-22 DIAGNOSIS — Z79899 Other long term (current) drug therapy: Secondary | ICD-10-CM

## 2020-04-22 LAB — CBC WITH DIFFERENTIAL/PLATELET
Absolute Monocytes: 341 cells/uL (ref 200–950)
Basophils Absolute: 50 cells/uL (ref 0–200)
Basophils Relative: 0.9 %
Eosinophils Absolute: 132 cells/uL (ref 15–500)
Eosinophils Relative: 2.4 %
HCT: 45.2 % — ABNORMAL HIGH (ref 35.0–45.0)
Hemoglobin: 14.9 g/dL (ref 11.7–15.5)
Lymphs Abs: 1546 cells/uL (ref 850–3900)
MCH: 31.4 pg (ref 27.0–33.0)
MCHC: 33 g/dL (ref 32.0–36.0)
MCV: 95.2 fL (ref 80.0–100.0)
MPV: 11.5 fL (ref 7.5–12.5)
Monocytes Relative: 6.2 %
Neutro Abs: 3432 cells/uL (ref 1500–7800)
Neutrophils Relative %: 62.4 %
Platelets: 200 10*3/uL (ref 140–400)
RBC: 4.75 10*6/uL (ref 3.80–5.10)
RDW: 12.7 % (ref 11.0–15.0)
Total Lymphocyte: 28.1 %
WBC: 5.5 10*3/uL (ref 3.8–10.8)

## 2020-04-22 LAB — COMPLETE METABOLIC PANEL WITH GFR
AG Ratio: 1.8 (calc) (ref 1.0–2.5)
ALT: 15 U/L (ref 6–29)
AST: 18 U/L (ref 10–35)
Albumin: 4.4 g/dL (ref 3.6–5.1)
Alkaline phosphatase (APISO): 59 U/L (ref 37–153)
BUN: 13 mg/dL (ref 7–25)
CO2: 31 mmol/L (ref 20–32)
Calcium: 10 mg/dL (ref 8.6–10.4)
Chloride: 102 mmol/L (ref 98–110)
Creat: 0.79 mg/dL (ref 0.50–1.05)
GFR, Est African American: 98 mL/min/{1.73_m2} (ref >=60)
GFR, Est Non African American: 84 mL/min/{1.73_m2} (ref >=60)
Globulin: 2.4 g/dL (calc) (ref 1.9–3.7)
Glucose, Bld: 107 mg/dL (ref 65–139)
Potassium: 4.4 mmol/L (ref 3.5–5.3)
Sodium: 140 mmol/L (ref 135–146)
Total Bilirubin: 0.6 mg/dL (ref 0.2–1.2)
Total Protein: 6.8 g/dL (ref 6.1–8.1)

## 2020-04-24 ENCOUNTER — Other Ambulatory Visit: Payer: Self-pay

## 2020-04-24 ENCOUNTER — Ambulatory Visit: Payer: 59 | Admitting: Physician Assistant

## 2020-04-24 ENCOUNTER — Encounter: Payer: Self-pay | Admitting: Physician Assistant

## 2020-04-24 VITALS — BP 108/61 | HR 60 | Resp 13 | Ht 66.0 in | Wt 164.4 lb

## 2020-04-24 DIAGNOSIS — Z8709 Personal history of other diseases of the respiratory system: Secondary | ICD-10-CM

## 2020-04-24 DIAGNOSIS — M2242 Chondromalacia patellae, left knee: Secondary | ICD-10-CM

## 2020-04-24 DIAGNOSIS — Z79899 Other long term (current) drug therapy: Secondary | ICD-10-CM

## 2020-04-24 DIAGNOSIS — M5030 Other cervical disc degeneration, unspecified cervical region: Secondary | ICD-10-CM

## 2020-04-24 DIAGNOSIS — M7711 Lateral epicondylitis, right elbow: Secondary | ICD-10-CM

## 2020-04-24 DIAGNOSIS — M170 Bilateral primary osteoarthritis of knee: Secondary | ICD-10-CM | POA: Diagnosis not present

## 2020-04-24 DIAGNOSIS — M0579 Rheumatoid arthritis with rheumatoid factor of multiple sites without organ or systems involvement: Secondary | ICD-10-CM | POA: Diagnosis not present

## 2020-04-24 DIAGNOSIS — M419 Scoliosis, unspecified: Secondary | ICD-10-CM

## 2020-04-24 DIAGNOSIS — G5761 Lesion of plantar nerve, right lower limb: Secondary | ICD-10-CM

## 2020-04-24 DIAGNOSIS — L409 Psoriasis, unspecified: Secondary | ICD-10-CM

## 2020-04-24 DIAGNOSIS — Z853 Personal history of malignant neoplasm of breast: Secondary | ICD-10-CM

## 2020-04-24 DIAGNOSIS — Z862 Personal history of diseases of the blood and blood-forming organs and certain disorders involving the immune mechanism: Secondary | ICD-10-CM

## 2020-04-24 DIAGNOSIS — M542 Cervicalgia: Secondary | ICD-10-CM

## 2020-04-24 DIAGNOSIS — M8589 Other specified disorders of bone density and structure, multiple sites: Secondary | ICD-10-CM

## 2020-04-24 DIAGNOSIS — M19041 Primary osteoarthritis, right hand: Secondary | ICD-10-CM | POA: Diagnosis not present

## 2020-04-24 DIAGNOSIS — R011 Cardiac murmur, unspecified: Secondary | ICD-10-CM

## 2020-04-24 DIAGNOSIS — M720 Palmar fascial fibromatosis [Dupuytren]: Secondary | ICD-10-CM

## 2020-04-24 DIAGNOSIS — M19042 Primary osteoarthritis, left hand: Secondary | ICD-10-CM

## 2020-04-24 NOTE — Patient Instructions (Addendum)
Standing Labs We placed an order today for your standing lab work.   Please have your standing labs drawn in January and every 5 months  If possible, please have your labs drawn 2 weeks prior to your appointment so that the provider can discuss your results at your appointment.  We have open lab daily Monday through Thursday from 8:30-12:30 PM and 1:30-4:30 PM and Friday from 8:30-12:30 PM and 1:30-4:00 PM at the office of Dr. Bo Merino, [facility].   Please be advised, patients with office appointments requiring lab work will take precedents over walk-in lab work.  If possible, please come for your lab work on Monday and Friday afternoons, as you may experience shorter wait times. The office is located at [address], [place], [place], [place] [address] No appointment is necessary.   Labs are drawn by [facility]. Please bring your co-pay at the time of your lab draw.  You may receive a bill from [facility] for your lab work.  If you wish to have your labs drawn at another location, please call the office 24 hours in advance to send orders.  If you have any questions regarding directions or hours of operation,  please call [phone].   As a reminder, please drink plenty of water prior to coming for your lab work. Thanks!  COVID-19 vaccine recommendations:   COVID-19 vaccine is recommended for everyone (unless you are allergic to a vaccine component), even if you are on a medication that suppresses your immune system.   If you are on Methotrexate, Cellcept (mycophenolate), Rinvoq, Morrie Sheldon, and Olumiant- hold the medication for 1 week after each vaccine. Hold Methotrexate for 2 weeks after the single dose COVID-19 vaccine.   If you are on Orencia subcutaneous injection - hold medication one week prior to and one week after the first COVID-19 vaccine dose (only).   If you are on Orencia IV infusions- time vaccination administration so that the first COVID-19 vaccination  will occur four weeks after the infusion and postpone the subsequent infusion by one week.   If you are on Cyclophosphamide or Rituxan infusions please contact your doctor prior to receiving the COVID-19 vaccine.   Do not take Tylenol or ant anti-inflammatory medications (NSAIDs) 24 hours prior to the COVID-19 vaccination.   There is no direct evidence about the efficacy of the COVID-19 vaccine in individuals who are on medications that suppress the immune system.   Even if you are fully vaccinated, and you are on any medications that suppress your immune system, please continue to wear a mask, maintain at least six feet social distance and practice hand hygiene.   If you develop a COVID-19 infection, please contact your PCP or our office to determine if you need antibody infusion.  We anticipate that a booster vaccine will be available soon for immunosuppressed individuals. Please cal our office before receiving your booster dose to make adjustments to your medication regimen.

## 2020-05-01 ENCOUNTER — Encounter: Payer: Self-pay | Admitting: Genetic Counselor

## 2020-05-13 ENCOUNTER — Other Ambulatory Visit: Payer: Self-pay | Admitting: Gastroenterology

## 2020-06-30 ENCOUNTER — Other Ambulatory Visit: Payer: Self-pay | Admitting: Rheumatology

## 2020-06-30 DIAGNOSIS — M0579 Rheumatoid arthritis with rheumatoid factor of multiple sites without organ or systems involvement: Secondary | ICD-10-CM

## 2020-07-01 NOTE — Telephone Encounter (Signed)
Last Visit: 04/24/2020 Next Visit: 09/25/2020 Labs: 04/22/2020 CBC and CMP WNL. PLQ Eye Exam: 09/28/2019 WNL  Current Dose per office note on 04/24/2020:Plaquenil 200 mg 1 tablet twice daily Dx: Rheumatoid arthritis involving multiple sites with positive rheumatoid factor   Okay to refill per Dr. Estanislado Pandy

## 2020-08-10 ENCOUNTER — Other Ambulatory Visit: Payer: Self-pay | Admitting: Gastroenterology

## 2020-08-26 ENCOUNTER — Encounter: Payer: Self-pay | Admitting: Nurse Practitioner

## 2020-08-26 ENCOUNTER — Ambulatory Visit: Payer: 59 | Admitting: Nurse Practitioner

## 2020-08-26 VITALS — BP 102/62 | HR 54 | Ht 63.0 in | Wt 163.6 lb

## 2020-08-26 DIAGNOSIS — Z86010 Personal history of colonic polyps: Secondary | ICD-10-CM | POA: Diagnosis not present

## 2020-08-26 DIAGNOSIS — K219 Gastro-esophageal reflux disease without esophagitis: Secondary | ICD-10-CM

## 2020-08-26 MED ORDER — PANTOPRAZOLE SODIUM 40 MG PO TBEC
40.0000 mg | DELAYED_RELEASE_TABLET | Freq: Every day | ORAL | 4 refills | Status: DC
Start: 2020-08-26 — End: 2021-09-01

## 2020-08-26 NOTE — Progress Notes (Signed)
Reviewed and agree with management plan.  Loudon Krakow T. Aftin Lye, MD FACG [facility]  

## 2020-08-26 NOTE — Patient Instructions (Signed)
We have sent the following medications to your pharmacy for you to pick up at your convenience: Pantoprazole  If you are age 56 or younger, your body mass index should be between 19-25. Your Body mass index is 28.98 kg/m. If this is out of the aformentioned range listed, please consider follow up with your Primary Care Provider.   You will be due for a recall colonoscopy in 04/2022. We will send you a reminder in the mail when it gets closer to that time.  Please follow-up in 1 year. Sooner if needed.   Thank you for choosing me and [facility].  [place]

## 2020-08-26 NOTE — Progress Notes (Signed)
08/26/2020 Rebecca Fleming [id number] [birth year]   Chief Complaint: Pantoprazole refill  History of Present Illness: Rebecca Fleming is a 54 year old female with a past medical history of arthritis, smoker, breast cancer status post bilateral mastectomy 2009, rheumatoid arthritis, GERD, esophageal stricture, H. Pylori gastritis 2019 and anemia.  She underwent an EGD by Dr. Fuller Plan 09/30/2017 which showed H. pylori gastritis which was treated with Pylera and PPI for 2 weeks.  She underwent a colonoscopy at the same time and a 28 mm tubulovillous adenomatous polyp was removed by piecemeal from the sigmoid colon.  She underwent a small bowel capsule endoscopy 07/15/2018 which showed very minimal fissuring to the proximal duodenum otherwise was negative.  A repeat colonoscopy was done 05/05/2019 and the tattoo from the prior polypectomy site was intact without evidence of residual polyp.  A 6 mm polyp was removed from the sigmoid colon and six  2-3 polyps were removed from the sigmoid colon.  Pathology report was consistent with hyperplastic polyps and benign colonic mucosa.  A repeat colonoscopy in 3 years was recommended.  She presents to our office today for her annual review and to refill her Pantoprazole.  She stated her reflux is typically well controlled as long as she takes pantoprazole 40 mg once daily.  However, last Friday and Saturday evening she drank 1 beer and developed indigestion.  No further reflux symptoms since that time.  No upper or lower abdominal pain.  She is passing normal brown bowel movements daily.  No rectal bleeding or black stools.  No other complaints today.  CBC Latest Ref Rng & Units 04/22/2020 11/08/2019 07/03/2019  WBC 3.8 - 10.8 Thousand/uL 5.5 4.1 6.3  Hemoglobin 11.7 - 15.5 g/dL 14.9 14.6 14.6  Hematocrit 35 - 45 % 45.2(H) 43.3 43.1  Platelets 140 - 400 Thousand/uL 200 193 215    Colonoscopy 05/05/2019: - A tattoo was seen in the sigmoid colon. A post-polypectomy  scar was found at the tattoo site. There was no evidence of residual polyp tissue. - One 6 mm polyp in the sigmoid colon, removed with a cold snare. Resected and retrieved. - Six 2 to 3 mm polyps in the sigmoid colon, removed with a cold biopsy forceps. Resected and retrieved. - Internal hemorrhoids. - The examination was otherwise normal on direct and retroflexion views. - 3 year recall colonoscopy HYPERPLASTIC POLYP (2 FRAGMENTS) - MULTIPLE FRAGMENTS OF BENIGN COLONIC MUCOSA - NO HIGH GRADE DYSPLASIA OR MALIGNANCY IDENTIFIED  Small bowel capsule endoscopy 07/15/2018: Complete study, fairly good prep Very minimal fissuring proximal duodenum Otherwise negative study  EGD 03/30/2018; - LA Grade A reflux esophagitis. - Benign-appearing esophageal stenosis. Dilated. - Gastritis. Biopsied. - Medium-sized hiatal hernia. - Normal duodenal bulb and second portion of the duodenum.  Colonoscopy 03/30/2018: One 28 mm polyp in the sigmoid colon, removed piecemeal using a hot snare. Resected and retrieved. Tattooed. - The examination was otherwise normal on direct and retroflexion views.  Biopsy report: 1. Surgical [P], sigmoid (removed piecemeal), polyp - TUBULOVILLOUS ADENOMA (TWO FRAGMENTS). - NO HIGH GRADE DYSPLASIA OR MALIGNANCY. 2. Surgical [P], gastric body (gastritis) - CHRONIC MILDLY ACTIVE HELICOBACTER PYLORI GASTRITIS. - NO INTESTINAL METAPLASIA, DYSPLASIA OR MALIGNANCY  Past Medical History:  Diagnosis Date  . Abnormal uterine bleeding   . Allergy   . Anemia   . Arthritis   . Asthma    exercised induced asthma - rarely uses inhaler;none since Chemo.  . Blood transfusion without reported diagnosis 1976  1976  following surgery at [facility]   . Cancer ([place]) 08/2008   left breast--has bilateral mastectomy  . Complication of anesthesia    pt states" gets shakes" with general  . Cyst near tailbone   . Elevated LDL cholesterol level   . Environmental and seasonal allergies   .  Fibroid   . Headache    sleeps , no meds  . Heart murmur    never has caused any problems  . Helicobacter pylori infection   . Hemorrhoids    hx of  . History of breast cancer   . History of shingles    03/07/18  . Hypotension    hx of  . Post-operative nausea and vomiting   . RA (rheumatoid arthritis) ([place])   . RA (rheumatoid arthritis) ([place])   . Scoliosis    tx with robaxin prn - rarely uses  . SVD (spontaneous vaginal delivery) 1996   x 1   Current Outpatient Medications on File Prior to Visit  Medication Sig Dispense Refill  . calcium carbonate (OS-CAL) 600 MG TABS tablet Take 600 mg by mouth 2 (two) times daily with a meal.    . cetirizine (ZYRTEC) 10 MG tablet Take 10 mg by mouth daily.      . folic acid (FOLVITE) 1 MG tablet TAKE 2 TABLETS BY MOUTH EVERY DAY 180 tablet 2  . GLUCOSAMINE PO Take 2 tablets by mouth daily.     . hydroxychloroquine (PLAQUENIL) 200 MG tablet TAKE 1 TABLET BY MOUTH TWICE A DAY 180 tablet 0  . ibuprofen (ADVIL) 200 MG tablet Take 200 mg by mouth as needed.    . Magnesium 500 MG TABS Take 1 tablet by mouth 2 (two) times daily.    . Manganese 50 MG TABS Take 1 tablet by mouth daily.    . methocarbamol (ROBAXIN) 500 MG tablet Take 1 tablet (500 mg total) by mouth every 8 (eight) hours as needed for muscle spasms. 30 tablet 1   No current facility-administered medications on file prior to visit.   Allergies  Allergen Reactions  . Latex Other (See Comments)    stuffiness  . Other Swelling    Patient is allergic to Yellow Dye #5  . Percocet [Oxycodone-Acetaminophen] Itching    Has had Vicodin- hydrocodone before without itching.    Current Medications, Allergies, Past Medical History, Past Surgical History, Family History and Social History were reviewed in [employer] record.   Review of Systems:   Constitutional: Negative for fever, sweats, chills or weight loss.  Respiratory: Negative for shortness of breath.    Cardiovascular: Negative for chest pain, palpitations and leg swelling.  Gastrointestinal: See HPI.  Musculoskeletal: Negative for back pain or muscle aches.  Neurological: Negative for dizziness, headaches or paresthesias.    Physical Exam: LMP 11/12/2014   BP 102/62   Pulse (!) 54   Ht 5\' 3"  (1.6 m)   Wt 163 lb 9.6 oz (74.2 kg)   LMP 11/12/2014   SpO2 96%   BMI 28.98 kg/m   General: Well developed 54 year old female in no acute distress. Head: Normocephalic and atraumatic. Eyes: No scleral icterus. Conjunctiva pink . Ears: Normal auditory acuity.  Lungs: Clear throughout to auscultation. Heart: Regular rate and rhythm, no murmur. Abdomen: Soft, nontender and nondistended. No masses or hepatomegaly. Normal bowel sounds x 4 quadrants.  Rectal: Furred. Musculoskeletal: Symmetrical with no gross deformities. Extremities: No edema. Neurological: Alert oriented x 4. No focal deficits.  Psychological:  Alert and cooperative. Normal mood and affect  Assessment and Recommendations:  53.  54 year old female with a history of GERD.  H. pylori gastritis 2019 treated with Pylera and PPI.  Post reatment H. pylori stool antigen/breath test not done as patient remains on PPI. -Continue Pantoprazole 40 mg once daily -PT to call our office if reflux symptoms recur -Follow-up in the office in 1 year and as needed  2.  History of a large 28 mm tubulovillous adenomatous polyp removed from the sigmoid colon the time of her colonoscopy 03/2018.  Follow-up colonoscopy 04/2019 showed a intact tattoo to the sigmoid colon without evidence of visual polyp.  Multiple hyperplastic polyps were noted to the sigmoid colon. -Next colonoscopy due 04/2022  3. History of IDA, resolved   4. History of breast cancer   5. History of rheumatoid arthritis

## 2020-09-11 NOTE — Progress Notes (Signed)
Office Visit Note  Patient: Rebecca Fleming             Date of Birth: 1965-09-27           MRN: [id number]             PCP: Nunzio Cobbs, MD Referring: No ref. provider found Visit Date: 09/25/2020 Occupation: @GUAROCC @  Subjective:  Right wrist joint   History of Present Illness: Rebecca Fleming is a 54 y.o. female with history of seropositive rheumatoid arthritis and osteoarthritis.  Patient is taking Plaquenil 200 mg 1 tablet by mouth twice daily.  She is tolerating Plaquenil without any side effects and has not missed any doses recently.  She denies any recent rheumatoid arthritis flares.  She states that she experiences occasional pain and stiffness in both hands especially first thing in the morning.  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  She denies any other joint pain or joint swelling at this time. She denies any recent infections.    Activities of Daily Living:  Patient reports morning stiffness for 20-30  minutes.   Patient Denies nocturnal pain.  Difficulty dressing/grooming: Denies Difficulty climbing stairs: Denies Difficulty getting out of chair: Denies Difficulty using hands for taps, buttons, cutlery, and/or writing: Reports  Review of Systems  Constitutional: Positive for fatigue.  HENT: Negative for mouth sores, mouth dryness and nose dryness.   Eyes: Negative for pain, visual disturbance and dryness.  Respiratory: Negative for cough, hemoptysis, shortness of breath and difficulty breathing.   Cardiovascular: Negative for chest pain, palpitations, hypertension and swelling in legs/feet.  Gastrointestinal: Negative for blood in stool, constipation and diarrhea.  Endocrine: Negative for increased urination.  Genitourinary: Negative for painful urination.  Musculoskeletal: Positive for arthralgias, joint pain and  morning stiffness. Negative for joint swelling, myalgias, muscle weakness, muscle tenderness and myalgias.  Skin: Negative for color change, pallor, rash, hair loss, nodules/bumps, skin tightness, ulcers and sensitivity to sunlight.  Allergic/Immunologic: Negative for susceptible to infections.  Neurological: Positive for headaches. Negative for dizziness, numbness and weakness.  Hematological: Negative for swollen glands.  Psychiatric/Behavioral: Negative for depressed mood and sleep disturbance. The patient is not nervous/anxious.     PMFS History:  Patient Active Problem List   Diagnosis Date Noted  . Primary osteoarthritis of both knees 09/01/2017  . Scoliosis 09/01/2017  . High risk medication use 11/11/2016  . Primary osteoarthritis of both hands 11/11/2016  . Lateral epicondylitis, right elbow 11/11/2016  . Myopia with astigmatism and presbyopia, bilateral 09/09/2016  . 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 Status post laparoscopic assisted vaginal hysterectomy ([facility]) 01/15/2015  . Breast cancer, left breast ([place]) 11/07/2014  . Postmenopausal bleeding 07/13/2013  . Rheumatoid arthritis ([place]) 01/11/2012  . Psoriasis 01/11/2012    Past Medical History:  Diagnosis Date  . Abnormal uterine bleeding   . Allergy   . Anemia   . Arthritis   . Asthma    exercised induced asthma - rarely uses inhaler;none since Chemo.  . Blood transfusion without reported diagnosis 1976   1976  following surgery at [facility]   . Cancer ([place]) 08/2008   left breast--has bilateral mastectomy  . Complication of anesthesia    pt states" gets shakes" with general  . Cyst near tailbone   . Elevated LDL cholesterol level   .  Environmental and seasonal allergies   . Fibroid   . Headache    sleeps , no meds  . Heart murmur    never has caused any problems  . Helicobacter pylori infection   . Hemorrhoids    hx of  . History of breast cancer   . History of shingles    03/07/18  .  Hypotension    hx of  . Post-operative nausea and vomiting   . RA (rheumatoid arthritis) ([place])   . RA (rheumatoid arthritis) ([place])   . Scoliosis    tx with robaxin prn - rarely uses  . SVD (spontaneous vaginal delivery) 1996   x 1    Family History  Problem Relation Age of Onset  . Breast cancer Mother   . Diabetes Mother   . Hypertension Mother   . Stroke Mother   . Irritable bowel syndrome Mother   . Kidney disease Mother   . Heart failure Father   . Hypertension Father   . Colon cancer Neg Hx   . Colon polyps Neg Hx   . Esophageal cancer Neg Hx   . Pancreatic cancer Neg Hx   . Rectal cancer Neg Hx   . Stomach cancer Neg Hx    Past Surgical History:  Procedure Laterality Date  . ABDOMINAL HYSTERECTOMY    . APPENDECTOMY    . BREAST LUMPECTOMY  05/17/2008   x 2 re excise for margin - Dr Margot Chimes, bilateral mastectomy 08/2008  . BREAST LUMPECTOMY W/ NEEDLE LOCALIZATION  04/23/2008   w [facility] Dr Margot Chimes  . CESAREAN SECTION  01/01/2004   Dr Quincy Simmonds  . COLONOSCOPY  03/2018  . COLONOSCOPY  05/05/2019  . ENDOMETRIAL BIOPSY  06/2013, 2015   x 2  . EYE SURGERY  2003   bilateral lasik  . FOOT SURGERY    . HYSTEROSCOPY WITH D & C  07/21/2011   Procedure: DILATATION AND CURETTAGE (D&C) /HYSTEROSCOPY;  Surgeon: Arloa Koh;  Location: [facility];  Service: Gynecology;  Laterality: N/A;  with Ultrasound Guidance  . INSERTION OF TISSUE EXPANDER AFTER MASTECTOMY  09/11/2008   Dr Harlow Mares  . KNEE ARTHROSCOPY W/ MENISCAL REPAIR Right 2010   --Dr. Telford Nab  . LAPAROSCOPIC ASSISTED VAGINAL HYSTERECTOMY N/A 01/15/2015   Procedure: LAPAROSCOPIC ASSISTED VAGINAL HYSTERECTOMY;  Surgeon: Nunzio Cobbs, MD;  Location: [facility];  Service: Gynecology;  Laterality: N/A;  . left foot surgery  2004  . left rotator cuff repair  01/2006  . MASTECTOMY  09/11/2008   bilateral - Dr Margot Chimes  . PORT-A-CATH REMOVAL  09/09/2009   Dr Margot Chimes  . PORTACATH PLACEMENT  05/17/2008   Dr Cori Razor rounds of chemo   . REMOVAL OF TISSUE EXPANDER AND PLACEMENT OF IMPLANT  12/2008  . RHINOPLASTY    . SALPINGOOPHORECTOMY Bilateral 01/15/2015   Procedure: BILATERAL SALPINGO OOPHORECTOMY;  Surgeon: Nunzio Cobbs, MD;  Location: [facility];  Service: Gynecology;  Laterality: Bilateral;  . UMBILICAL HERNIA REPAIR  09/09/2009   Dr Margot Chimes  . UPPER GI ENDOSCOPY  03/2018  . WISDOM TOOTH EXTRACTION     Social History   Social History Narrative  . Not on file   Immunization History  Administered Date(s) Administered  . Influenza,inj,Quad PF,6+ Mos 07/12/2019  . PFIZER SARS-COV-2 Vaccination 10/05/2019, 10/25/2019, 05/11/2020  . Pneumococcal Polysaccharide-23 01/16/2015  . Tdap 09/21/2010, 07/12/2019     Objective: Vital Signs: BP (!) 105/58 (BP Location: Right Arm, Patient Position: Sitting, Cuff Size: Normal)  Pulse 60   Resp 14   Ht 5\' 6"  (1.676 m)   Wt 164 lb (74.4 kg)   LMP 11/12/2014   BMI 26.47 kg/m    Physical Exam Vitals and nursing note reviewed.  Constitutional:      Appearance: She is well-developed and well-nourished.  HENT:     Head: Normocephalic and atraumatic.  Eyes:     Extraocular Movements: EOM normal.     Conjunctiva/sclera: Conjunctivae normal.  Cardiovascular:     Pulses: Intact distal pulses.  Pulmonary:     Effort: Pulmonary effort is normal.  Abdominal:     Palpations: Abdomen is soft.  Musculoskeletal:     Cervical back: Normal range of motion.  Skin:    General: Skin is warm and dry.     Capillary Refill: Capillary refill takes less than 2 seconds.  Neurological:     Mental Status: She is alert and oriented to person, place, and time.  Psychiatric:        Mood and Affect: Mood and affect normal.        Behavior: Behavior normal.      Musculoskeletal Exam: C-spine, thoracic spine, lumbar spine have good range of motion with no discomfort.  Shoulder joints and elbow joints have good range of motion with no discomfort.  She has tenderness and  inflammation on the ulnar aspect of the right wrist.  Tenderness and synovitis of the left fifth PIP joint noted.  She is able to make complete fist formation bilaterally.  PIP and DIP thickening consistent with osteoarthritis of both hands noted.  Hip joints have good range of motion with no discomfort.  No tenderness over trochanteric bursa bilaterally.  Knee joints have good range of motion with no warmth or effusion.  Ankle joints have good range of motion with no tenderness or inflammation.   CDAI Exam: CDAI Score: 4.4  Patient Global: 2 mm; Provider Global: 2 mm Swollen: 2 ; Tender: 2  Joint Exam 09/25/2020      Right  Left  Wrist  Swollen Tender     PIP 5     Swollen Tender     Investigation: No additional findings.  Imaging: No results found.  Recent Labs: Lab Results  Component Value Date   WBC 5.5 04/22/2020   HGB 14.9 04/22/2020   PLT 200 04/22/2020   NA 140 04/22/2020   K 4.4 04/22/2020   CL 102 04/22/2020   CO2 31 04/22/2020   GLUCOSE 107 04/22/2020   BUN 13 04/22/2020   CREATININE 0.79 04/22/2020   BILITOT 0.6 04/22/2020   ALKPHOS 66 03/31/2017   AST 18 04/22/2020   ALT 15 04/22/2020   PROT 6.8 04/22/2020   ALBUMIN 4.1 03/31/2017   CALCIUM 10.0 04/22/2020   GFRAA 98 04/22/2020    Speciality Comments: PLQ Eye Exam: 09/28/2019 WNL @ [facility]. Follow up in 1 year. (in [place])  Procedures:  No procedures performed Allergies: Latex, Other, and Percocet [oxycodone-acetaminophen]   Assessment / Plan:     Visit Diagnoses: Rheumatoid arthritis involving multiple sites with positive rheumatoid factor ([place]) - +RF, +CCP: She has tenderness and mild inflammation on the ulnar aspect of the right wrist.  Tenderness and synovitis of the left fifth PIP joint noted.  Overall she is clinically been doing well on Plaquenil 200 mg 1 tablet by mouth twice daily.  She has not missed any doses of Plaquenil recently.  She has been experiencing some increased  morning stiffness with cooler  weather temperatures recently.  She has not had any nocturnal pain or difficulty with ADLs.  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  We discussed using Voltaren gel topically as needed for pain relief. Also discussed the use of arthritis compression gloves. She was advised to notify us if she develops more frequent flares.  She will continue taking Plaquenil 200 mg 1 tablet by mouth twice daily.  She will follow up in the office in 5 months.  High risk medication use - Plaquenil 200 mg 1 tablet by mouth twice daily.  Eye Exam: 09/28/2019.  CBC and CMP were updated on 04/22/2020.  She is due to update lab work today.  Orders for CBC and CMP were released.  PLQ Eye Exam: 09/28/2019 WNL @ [facility]. Follow up in 1 year. (in [place]).  She is scheduled for an updated PLQ eye exam on 09/30/20. She was given a PLQ eye exam form to take with her to her upcoming appointment.   - Plan: CBC with Differential/Platelet, COMPLETE METABOLIC PANEL WITH GFR She has not had any recent infections.  She has received all 3 COVID-19 vaccinations.  She had a Covid 19 exposure the Wednesday before Christmas and was tested twice but did not test positive or develop symptoms.  Primary osteoarthritis of both hands: She has PIP and DIP thickening consistent with osteoarthritis of both hands.  She is able to make a complete fist bilaterally.  She has been experiencing intermittent pain and stiffness in the both hands first thing in the morning with cooler weather temperatures.  Joint protection and muscle strengthening were discussed.  We also discussed the use of Voltaren gel which she can apply topically as needed for pain relief.  Primary osteoarthritis of both knees: She has good range of motion of both knee joints on examination today.  No warmth or effusion was noted.  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  Discussed the importance of regular exercise and muscle strengthening.  Chondromalacia of left patella: She has good range of motion of the left knee joint on exam.  Left knee joint crepitus noted.  Psoriasis: She has no active psoriasis at this time.  Osteopenia of multiple sites - DEXA on 04/24/2019 BMD measured at femur neck right is 0.823 with T score -1.5.  She continues to take a calcium and vitamin D supplement.  Due to update DEXA in August 2022.  She has not had any recent falls or fractures.  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  DDD (degenerative disc disease), cervical - MRI of the C-spine performed on 01/09/2020.  She was evaluated by a neurosurgeon.  She underwent physical therapy and her range of motion has improved significantly.  She has good range of motion with no discomfort on examination today.  She has no symptoms of radiculopathy.  Scoliosis, unspecified scoliosis type, unspecified spinal region  Dupuytren's contracture of right hand: She has noticed less thickening and tenderness recently.  Morton's neuroma of right foot: She is not experiencing any discomfort at this time.  Lateral epicondylitis, right elbow - Resolved.  Other medical conditions are listed as follows:  History of breast cancer  Heart murmur  History of asthma  History of anemia  Orders: Orders Placed This Encounter  Procedures  . CBC with Differential/Platelet  . COMPLETE METABOLIC PANEL WITH GFR   No orders of  the defined types were placed in this encounter.     Follow-Up Instructions: Return in about 5 months (around 02/23/2021) for Rheumatoid arthritis, Osteoarthritis.   Ofilia Neas, PA-C  Note - This record has been created using Dragon software.  Chart creation errors have been sought, but may not always  have been located. Such creation errors do not reflect on  the standard of medical care.

## 2020-09-25 ENCOUNTER — Encounter: Payer: Self-pay | Admitting: Physician Assistant

## 2020-09-25 ENCOUNTER — Ambulatory Visit: Payer: 59 | Admitting: Physician Assistant

## 2020-09-25 ENCOUNTER — Other Ambulatory Visit: Payer: Self-pay

## 2020-09-25 VITALS — BP 105/58 | HR 60 | Resp 14 | Ht 66.0 in | Wt 164.0 lb

## 2020-09-25 DIAGNOSIS — M19042 Primary osteoarthritis, left hand: Secondary | ICD-10-CM

## 2020-09-25 DIAGNOSIS — M170 Bilateral primary osteoarthritis of knee: Secondary | ICD-10-CM

## 2020-09-25 DIAGNOSIS — Z8709 Personal history of other diseases of the respiratory system: Secondary | ICD-10-CM

## 2020-09-25 DIAGNOSIS — R011 Cardiac murmur, unspecified: Secondary | ICD-10-CM

## 2020-09-25 DIAGNOSIS — M419 Scoliosis, unspecified: Secondary | ICD-10-CM

## 2020-09-25 DIAGNOSIS — Z79899 Other long term (current) drug therapy: Secondary | ICD-10-CM | POA: Diagnosis not present

## 2020-09-25 DIAGNOSIS — M0579 Rheumatoid arthritis with rheumatoid factor of multiple sites without organ or systems involvement: Secondary | ICD-10-CM | POA: Diagnosis not present

## 2020-09-25 DIAGNOSIS — M720 Palmar fascial fibromatosis [Dupuytren]: Secondary | ICD-10-CM

## 2020-09-25 DIAGNOSIS — M5030 Other cervical disc degeneration, unspecified cervical region: Secondary | ICD-10-CM

## 2020-09-25 DIAGNOSIS — L409 Psoriasis, unspecified: Secondary | ICD-10-CM

## 2020-09-25 DIAGNOSIS — M2242 Chondromalacia patellae, left knee: Secondary | ICD-10-CM

## 2020-09-25 DIAGNOSIS — Z862 Personal history of diseases of the blood and blood-forming organs and certain disorders involving the immune mechanism: Secondary | ICD-10-CM

## 2020-09-25 DIAGNOSIS — Z853 Personal history of malignant neoplasm of breast: Secondary | ICD-10-CM

## 2020-09-25 DIAGNOSIS — M7711 Lateral epicondylitis, right elbow: Secondary | ICD-10-CM

## 2020-09-25 DIAGNOSIS — M8589 Other specified disorders of bone density and structure, multiple sites: Secondary | ICD-10-CM

## 2020-09-25 DIAGNOSIS — M19041 Primary osteoarthritis, right hand: Secondary | ICD-10-CM

## 2020-09-25 DIAGNOSIS — G5761 Lesion of plantar nerve, right lower limb: Secondary | ICD-10-CM

## 2020-09-26 ENCOUNTER — Other Ambulatory Visit: Payer: Self-pay | Admitting: Rheumatology

## 2020-09-26 DIAGNOSIS — M0579 Rheumatoid arthritis with rheumatoid factor of multiple sites without organ or systems involvement: Secondary | ICD-10-CM

## 2020-09-26 LAB — COMPLETE METABOLIC PANEL WITH GFR
AG Ratio: 1.8 (calc) (ref 1.0–2.5)
ALT: 13 U/L (ref 6–29)
AST: 17 U/L (ref 10–35)
Albumin: 4.1 g/dL (ref 3.6–5.1)
Alkaline phosphatase (APISO): 53 U/L (ref 37–153)
BUN: 18 mg/dL (ref 7–25)
CO2: 32 mmol/L (ref 20–32)
Calcium: 9.6 mg/dL (ref 8.6–10.4)
Chloride: 106 mmol/L (ref 98–110)
Creat: 0.8 mg/dL (ref 0.50–1.05)
GFR, Est African American: 96 mL/min/{1.73_m2} (ref >=60)
GFR, Est Non African American: 82 mL/min/{1.73_m2} (ref >=60)
Globulin: 2.3 g/dL (calc) (ref 1.9–3.7)
Glucose, Bld: 59 mg/dL — ABNORMAL LOW (ref 65–99)
Potassium: 4.6 mmol/L (ref 3.5–5.3)
Sodium: 144 mmol/L (ref 135–146)
Total Bilirubin: 0.6 mg/dL (ref 0.2–1.2)
Total Protein: 6.4 g/dL (ref 6.1–8.1)

## 2020-09-26 LAB — CBC WITH DIFFERENTIAL/PLATELET
Absolute Monocytes: 468 cells/uL (ref 200–950)
Basophils Absolute: 51 cells/uL (ref 0–200)
Basophils Relative: 1.3 %
Eosinophils Absolute: 269 cells/uL (ref 15–500)
Eosinophils Relative: 6.9 %
HCT: 43.2 % (ref 35.0–45.0)
Hemoglobin: 14.9 g/dL (ref 11.7–15.5)
Lymphs Abs: 1346 cells/uL (ref 850–3900)
MCH: 32.3 pg (ref 27.0–33.0)
MCHC: 34.5 g/dL (ref 32.0–36.0)
MCV: 93.7 fL (ref 80.0–100.0)
MPV: 11.1 fL (ref 7.5–12.5)
Monocytes Relative: 12 %
Neutro Abs: 1767 cells/uL (ref 1500–7800)
Neutrophils Relative %: 45.3 %
Platelets: 194 10*3/uL (ref 140–400)
RBC: 4.61 10*6/uL (ref 3.80–5.10)
RDW: 12.5 % (ref 11.0–15.0)
Total Lymphocyte: 34.5 %
WBC: 3.9 10*3/uL (ref 3.8–10.8)

## 2020-09-26 NOTE — Telephone Encounter (Signed)
Last Visit: 09/25/2020 Next Visit:02/25/2021 Labs: 09/25/2020, CBC and CMP WNL Eye exam:09/28/2019, She is scheduled for an updated PLQ eye exam on 09/30/20  Current Dose per office note 09/25/2020, Plaquenil 200 mg 1 tablet by mouth twice daily. [id number] arthritis involving multiple sites with positive rheumatoid factor   Okay to refill Plaquenil?

## 2020-09-26 NOTE — Progress Notes (Signed)
CBC and CMP WNL

## 2020-10-03 ENCOUNTER — Telehealth: Payer: Self-pay | Admitting: *Deleted

## 2020-10-03 DIAGNOSIS — M0579 Rheumatoid arthritis with rheumatoid factor of multiple sites without organ or systems involvement: Secondary | ICD-10-CM

## 2020-10-03 NOTE — Telephone Encounter (Signed)
09/30/2020 [facility] [place] [phone], Decrease PLQ to 200 mg BID M-F per Dr. Sheran Fava [facility]

## 2020-10-03 NOTE — Telephone Encounter (Signed)
Patient advised about dose change of PLQ.

## 2020-10-10 MED ORDER — HYDROXYCHLOROQUINE SULFATE 200 MG PO TABS: ORAL_TABLET | ORAL | 0 refills | Status: DC

## 2020-10-13 ENCOUNTER — Other Ambulatory Visit: Payer: Self-pay | Admitting: Rheumatology

## 2020-11-26 ENCOUNTER — Telehealth: Payer: Self-pay

## 2020-11-26 NOTE — Telephone Encounter (Signed)
It would be good to examine her in the morning without giving her any medications.

## 2020-11-26 NOTE — Progress Notes (Signed)
Office Visit Note  Patient: Rebecca Fleming             Date of Birth: Aug 20, 1966           MRN: [id number]             PCP: Nunzio Cobbs, MD Referring: Aundria Rud* Visit Date: 11/27/2020 Occupation: @GUAROCC @  Subjective:  Pain in multiple joints   History of Present Illness: Rebecca Fleming is a 55 y.o. female with history of seropositive rheumatoid arthritis and osteoarthritis.  She is taking plaquenil 200 mg 1 tablet by mouth twice daily Monday through Friday.  She reduce the dose of Plaquenil 6 to 8 weeks ago as recommended by her ophthalmologist.  Her most recent Plaquenil eye exam was performed by Dr. Amalia Hailey on 09/30/2020 which did not reveal any signs of Plaquenil eye toxicity but due to weight based dosing he recommended reducing the dose of PLQ to BID M-F rather than BID.  She states since reducing the dose she has noticed increased joint pain, stiffness, and swelling in mulitple joints.  Her discomfort has been most severe in the left shoulder, left elbow, left wrist, both hands, and the right ankle joint.  She has noticed intermittent swelling in her left wrist and both hands.  She has tried wearing a wrist compression brace and has been taking advil 2-3 tablets at bedtime for pain relief.    Activities of Daily Living:  Patient reports morning stiffness for few hours.   Patient Reports nocturnal pain.  Difficulty dressing/grooming: Denies Difficulty climbing stairs: Denies Difficulty getting out of chair: Denies Difficulty using hands for taps, buttons, cutlery, and/or writing: Reports  Review of Systems  Constitutional: Positive for fatigue.  HENT: Negative for mouth sores, mouth dryness and nose dryness.   Eyes: Negative for pain, visual disturbance and dryness.  Respiratory: Negative for cough, hemoptysis, shortness of breath and difficulty breathing.   Cardiovascular: Negative for chest pain, palpitations, hypertension and swelling in legs/feet.   Gastrointestinal: Negative for blood in stool, constipation and diarrhea.  Endocrine: Negative for increased urination.  Genitourinary: Negative for painful urination.  Musculoskeletal: Positive for arthralgias, joint pain, joint swelling and morning stiffness. Negative for myalgias, muscle weakness, muscle tenderness and myalgias.  Skin: Negative for color change, pallor, rash, hair loss, nodules/bumps, skin tightness, ulcers and sensitivity to sunlight.  Allergic/Immunologic: Negative for susceptible to infections.  Neurological: Positive for headaches. Negative for dizziness, numbness and weakness.  Hematological: Negative for swollen glands.  Psychiatric/Behavioral: Negative for depressed mood and sleep disturbance (Nocturnal pain). The patient is not nervous/anxious.     PMFS History:  Patient Active Problem List   Diagnosis Date Noted  . Primary osteoarthritis of both knees 09/01/2017  . Scoliosis 09/01/2017  . High risk medication use 11/11/2016  . Primary osteoarthritis of both hands 11/11/2016  . Lateral epicondylitis, right elbow 11/11/2016  . Myopia with astigmatism and presbyopia, bilateral 09/09/2016  . 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 Status post laparoscopic assisted vaginal hysterectomy (LAVH) 01/15/2015  . Breast cancer, left breast ([place]) 11/07/2014  . Postmenopausal bleeding 07/13/2013  . Rheumatoid arthritis ([place]) 01/11/2012  . Psoriasis 01/11/2012    Past Medical History:  Diagnosis Date  . Abnormal uterine bleeding   . Allergy   . Anemia   . Arthritis   . Asthma    exercised induced asthma - rarely uses inhaler;none since Chemo.  . Blood transfusion without reported diagnosis 1976  1976  following surgery at [facility]   . Cancer ([place]) 08/2008   left breast--has bilateral mastectomy  . Complication of anesthesia    pt states" gets shakes" with general  . Cyst near tailbone   . Elevated LDL cholesterol level   . Environmental and  seasonal allergies   . Fibroid   . Headache    sleeps , no meds  . Heart murmur    never has caused any problems  . Helicobacter pylori infection   . Hemorrhoids    hx of  . History of breast cancer   . History of shingles    03/07/18  . Hypotension    hx of  . Post-operative nausea and vomiting   . RA (rheumatoid arthritis) ([place])   . RA (rheumatoid arthritis) ([place])   . Scoliosis    tx with robaxin prn - rarely uses  . SVD (spontaneous vaginal delivery) 1996   x 1    Family History  Problem Relation Age of Onset  . Breast cancer Mother   . Diabetes Mother   . Hypertension Mother   . Stroke Mother   . Irritable bowel syndrome Mother   . Kidney disease Mother   . Heart failure Father   . Hypertension Father   . Colon cancer Neg Hx   . Colon polyps Neg Hx   . Esophageal cancer Neg Hx   . Pancreatic cancer Neg Hx   . Rectal cancer Neg Hx   . Stomach cancer Neg Hx    Past Surgical History:  Procedure Laterality Date  . ABDOMINAL HYSTERECTOMY    . APPENDECTOMY    . BREAST LUMPECTOMY  05/17/2008   x 2 re excise for margin - Dr Margot Chimes, bilateral mastectomy 08/2008  . BREAST LUMPECTOMY W/ NEEDLE LOCALIZATION  04/23/2008   w [facility] Dr Margot Chimes  . CESAREAN SECTION  01/01/2004   Dr Quincy Simmonds  . COLONOSCOPY  03/2018  . COLONOSCOPY  05/05/2019  . ENDOMETRIAL BIOPSY  06/2013, 2015   x 2  . EYE SURGERY  2003   bilateral lasik  . FOOT SURGERY    . HYSTEROSCOPY WITH D & C  07/21/2011   Procedure: DILATATION AND CURETTAGE (D&C) /HYSTEROSCOPY;  Surgeon: Arloa Koh;  Location: [facility];  Service: Gynecology;  Laterality: N/A;  with Ultrasound Guidance  . INSERTION OF TISSUE EXPANDER AFTER MASTECTOMY  09/11/2008   Dr Harlow Mares  . KNEE ARTHROSCOPY W/ MENISCAL REPAIR Right 2010   --Dr. Telford Nab  . LAPAROSCOPIC ASSISTED VAGINAL HYSTERECTOMY N/A 01/15/2015   Procedure: LAPAROSCOPIC ASSISTED VAGINAL HYSTERECTOMY;  Surgeon: Nunzio Cobbs, MD;  Location: [facility];  Service: Gynecology;   Laterality: N/A;  . left foot surgery  2004  . left rotator cuff repair  01/2006  . MASTECTOMY  09/11/2008   bilateral - Dr Margot Chimes  . PORT-A-CATH REMOVAL  09/09/2009   Dr Margot Chimes  . PORTACATH PLACEMENT  05/17/2008   Dr Cori Razor rounds of chemo  . REMOVAL OF TISSUE EXPANDER AND PLACEMENT OF IMPLANT  12/2008  . RHINOPLASTY    . SALPINGOOPHORECTOMY Bilateral 01/15/2015   Procedure: BILATERAL SALPINGO OOPHORECTOMY;  Surgeon: Nunzio Cobbs, MD;  Location: [facility];  Service: Gynecology;  Laterality: Bilateral;  . UMBILICAL HERNIA REPAIR  09/09/2009   Dr Margot Chimes  . UPPER GI ENDOSCOPY  03/2018  . WISDOM TOOTH EXTRACTION     Social History   Social History Narrative  . Not on file   Immunization History  Administered Date(s)  Administered  . Influenza,inj,Quad PF,6+ Mos 07/12/2019  . PFIZER(Purple Top)SARS-COV-2 Vaccination 10/05/2019, 10/25/2019, 05/11/2020  . Pneumococcal Polysaccharide-23 01/16/2015  . Tdap 09/21/2010, 07/12/2019     Objective: Vital Signs: BP 108/76 (BP Location: Right Arm, Patient Position: Sitting, Cuff Size: Normal)   Pulse 60   Resp 14   Ht 5\' 6"  (1.676 m)   Wt 168 lb 9.6 oz (76.5 kg)   LMP 11/12/2014   BMI 27.21 kg/m    Physical Exam Vitals and nursing note reviewed.  Constitutional:      Appearance: She is well-developed and well-nourished.  HENT:     Head: Normocephalic and atraumatic.  Eyes:     Extraocular Movements: EOM normal.     Conjunctiva/sclera: Conjunctivae normal.  Cardiovascular:     Pulses: Intact distal pulses.  Pulmonary:     Effort: Pulmonary effort is normal.  Abdominal:     Palpations: Abdomen is soft.  Musculoskeletal:     Cervical back: Normal range of motion.  Skin:    General: Skin is warm and dry.     Capillary Refill: Capillary refill takes less than 2 seconds.  Neurological:     Mental Status: She is alert and oriented to person, place, and time.  Psychiatric:        Mood and Affect: Mood and affect  normal.        Behavior: Behavior normal.      Musculoskeletal Exam: C-spine, thoracic spine and lumbar spine good range of motion with no discomfort.  Shoulder joints have good range of motion with tenderness over the left AC joint.  She has tenderness palpation over the left medial epicondyle.  Tenderness and synovitis of the wrist noted.  Tenderness and synovitis over the right third MCP and left second MCP joint.  Hip joints have good range of motion.  Tenderness palpation over the trochanter bursa bilaterally.  Knee joints have good range of motion with no warmth or effusion.  Tenderness over the lateral aspect of the right ankle.  Tenderness over the left first MTP joint.  CDAI Exam: CDAI Score: 9.5  Patient Global: 9 mm; Provider Global: 6 mm Swollen: 3 ; Tender: 7  Joint Exam 11/27/2020      Right  Left  Glenohumeral      Tender  Elbow      Tender  Wrist     Swollen Tender  MCP 2     Swollen Tender  MCP 3  Swollen Tender     Ankle   Tender     MTP 1      Tender     Investigation: No additional findings.  Imaging: No results found.  Recent Labs: Lab Results  Component Value Date   WBC 3.9 09/25/2020   HGB 14.9 09/25/2020   PLT 194 09/25/2020   NA 144 09/25/2020   K 4.6 09/25/2020   CL 106 09/25/2020   CO2 32 09/25/2020   GLUCOSE 59 (L) 09/25/2020   BUN 18 09/25/2020   CREATININE 0.80 09/25/2020   BILITOT 0.6 09/25/2020   ALKPHOS 66 03/31/2017   AST 17 09/25/2020   ALT 13 09/25/2020   PROT 6.4 09/25/2020   ALBUMIN 4.1 03/31/2017   CALCIUM 9.6 09/25/2020   GFRAA 96 09/25/2020    Speciality Comments: PLQ Eye Exam: 09/30/2020  WNL @ [facility] [phone] . Follow up in 1 year..  Procedures:  No procedures performed Allergies: Latex, Other, and Percocet [oxycodone-acetaminophen]   Assessment / Plan:  Visit Diagnoses: Rheumatoid arthritis involving multiple sites with positive rheumatoid factor (HCC) -  +RF, +CCP:  She presents today with tenderness and synovitis over the left wrist joint.  She is also experiencing tenderness and synovitis over the right third MCP and left second MCP joint.  He has been experiencing increased discomfort in the left shoulder, left elbow, left wrist, both hands, and the right ankle joint over the past several weeks.  She is currently taking Plaquenil 200 mg 1 tablet by mouth twice daily Monday through Friday.  She reduce the dose of Plaquenil from twice daily every day to twice daily Monday through Friday as recommended by her ophthalmologist after her eye exam on 09/30/2020.  There was no signs of Plaquenil eye toxicity but due to weight-based dosing her dose was reduced.  At her last office visit on 09/26/2019 she had mild inflammation on the ulnar aspect of the left wrist.  We discussed that her rheumatoid arthritis does not seem well controlled on Plaquenil and that we do not recommend increasing the dose of Plaquenil at this time.  Different treatment options were discussed today in detail.  Indications, contraindications, potential side effects of Arava were discussed in detail.  All questions were addressed and consent was obtained.  She will start on Arava 10 mg 1 tablet by mouth daily pending lab work obtained today.  She will return for lab work in 2 weeks and then at her 6-week follow-up.  She will remain on low-dose arava unless she requires a higher dose for efficacy.  She will continue taking Plaquenil 200 mg 1 tablet by mouth twice daily Monday through Friday.  She will follow-up in the office in 6 weeks to assess her response to starting [place].   Medication counseling:   Baseline Immunosuppressant Therapy Labs Hepatitis Latest Ref Rng & Units 01/29/2016  Hep C Ab NEGATIVE NEGATIVE    Serum Protein Electrophoresis Latest Ref Rng & Units 09/25/2020  Total Protein 6.1 - 8.1 g/dL 6.4   Patient was counseled on the purpose, proper use, and adverse effects of leflunomide  including risk of infection, nausea/diarrhea/weight loss, increase in blood pressure, rash, hair loss, tingling in the hands and feet, and signs and symptoms of interstitial lung disease.   Also counseled on Black Box warning of liver injury and importance of avoiding alcohol while on therapy. Discussed that there is the possibility of an increased risk of malignancy but it is not well understood if this increased risk is due to the medication or the disease state.  Counseled patient to avoid live vaccines. Recommend annual influenza, Pneumovax 23, Prevnar 13, and Shingrix as indicated.   Discussed the importance of frequent monitoring of liver function and blood count.  Standing orders placed.  Provided patient with educational materials on leflunomide and answered all questions.  Patient consented to [place] use, and consent will be uploaded into the media tab.   Patient dose will be 10 mg 1 tablet by mouth daily.  Prescription pending lab results and/or insurance approval.  High risk medication use - She will be starting on arava 10 mg 1 tablet by mouth daily pending lab results.  She will continue taking Plaquenil 200 mg 1 tablet by mouth twice daily Monday through Friday.  PLQ Eye Exam: 09/30/2020-No signs of PLQ toxicity-reduced dose of PLQ due to weight based dosing as recommended by Dr. Amalia Hailey.  We will obtain the following lab work today prior to starting the patient on [place].  She will return  for lab work in 2 weeks and then in 6 weeks for her follow-up visit.- Plan: CBC with Differential/Platelet, Comprehensive Metabolic Panel (CMET), QuantiFERON-TB Gold Plus, Hepatitis B core antibody, IgM, Hepatitis B surface antigen, HIV Antibody (routine testing w rflx), IgG, IgA, IgM  She has not had any recent infections.  We discussed the importance of holding [facility] if she develops signs or symptoms of infection and to resume once infection has completely cleared.  She voiced understanding.  She has received 3  pfizer covid-19 vaccine doses and plans on receiving the 4th dose now that she will be starting on arava.   Primary osteoarthritis of both hands: No tenderness or inflammation of PIP or DIP joints.  She was able to make a complete fist bilaterally.  Joint protection and muscle strengthening were discussed.  Primary osteoarthritis of both knees: She has intermittent pain and stiffness in both knee joints.  She has good range of motion of both knees on examination today.  Bilateral knee crepitus noted.  No warmth or effusion was noted.  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  Chondromalacia of left patella: Left knee crepitus noted.  Psoriasis: She has no active psoriasis currently.  Osteopenia of multiple sites - DEXA on 04/24/2019 BMD measured at femur neck right is 0.823 with T score -1.5.  She is taking a calcium supplement on a daily basis.  She has not had any recent falls or fractures.  DDD (degenerative disc disease), cervical - MRI of the C-spine performed on 01/09/2020.  She was evaluated by a neurosurgeon.  She underwent physical therapy and her range of motion has improved.  She is not experiencing any discomfort in her neck at this time.  She has no symptoms of radiculopathy.  Other medical conditions are listed as follows:  Scoliosis, unspecified scoliosis type, unspecified spinal region  Dupuytren's contracture of right hand  Morton's neuroma of right foot  Lateral epicondylitis, right elbow - Resolved.  Heart murmur  History of breast cancer  History of anemia  History of asthma  Orders: Orders Placed This Encounter  Procedures  . CBC with Differential/Platelet  . Comprehensive Metabolic Panel (CMET)  . QuantiFERON-TB Gold Plus  . Hepatitis B core antibody, IgM  . Hepatitis B surface antigen  . HIV Antibody (routine testing w rflx)  . IgG, IgA, IgM   Meds ordered this encounter  Medications  . predniSONE (DELTASONE) 5 MG tablet    Sig:  Take 4 tablets by mouth daily x4 days, 3 tablets by mouth daily x4 days, 2 tablets by mouth daily x4 days, 1 tablet by mouth daily x4 days.    Dispense:  40 tablet    Refill:  0     Follow-Up Instructions: Return in about 6 weeks (around 01/08/2021) for Rheumatoid arthritis, Osteoarthritis.   Ofilia Neas, PA-C  Note - This record has been created using Dragon software.  Chart creation errors have been sought, but may not always  have been located. Such creation errors do not reflect on  the standard of medical care.

## 2020-11-26 NOTE — Telephone Encounter (Signed)
Please advise 

## 2020-11-26 NOTE — Telephone Encounter (Signed)
Scheduled 11/27/2020

## 2020-11-26 NOTE — Telephone Encounter (Signed)
Patient called stating her left wrist is swollen and "feels like it is broken."  Patient states she began having swelling in her shoulder, elbow, wrist, hand, and index finger all on the left side approximately 1 week ago.  Patient states her wrist has gotten worse and now she can not put any pressure on it.  Patient states she did not fall or hit or wrist.  Patient was offered an appointment with Lovena Le tomorrow 11/27/20 at 8:00 am.  Patient asked to leave a message to see if she needed to come in or if something could be sent to the pharmacy.

## 2020-11-27 ENCOUNTER — Encounter: Payer: Self-pay | Admitting: Physician Assistant

## 2020-11-27 ENCOUNTER — Other Ambulatory Visit: Payer: Self-pay

## 2020-11-27 ENCOUNTER — Ambulatory Visit: Payer: 59 | Admitting: Physician Assistant

## 2020-11-27 VITALS — BP 108/76 | HR 60 | Resp 14 | Ht 66.0 in | Wt 168.6 lb

## 2020-11-27 DIAGNOSIS — M720 Palmar fascial fibromatosis [Dupuytren]: Secondary | ICD-10-CM

## 2020-11-27 DIAGNOSIS — M8589 Other specified disorders of bone density and structure, multiple sites: Secondary | ICD-10-CM

## 2020-11-27 DIAGNOSIS — M170 Bilateral primary osteoarthritis of knee: Secondary | ICD-10-CM

## 2020-11-27 DIAGNOSIS — Z853 Personal history of malignant neoplasm of breast: Secondary | ICD-10-CM

## 2020-11-27 DIAGNOSIS — M419 Scoliosis, unspecified: Secondary | ICD-10-CM

## 2020-11-27 DIAGNOSIS — R011 Cardiac murmur, unspecified: Secondary | ICD-10-CM

## 2020-11-27 DIAGNOSIS — M0579 Rheumatoid arthritis with rheumatoid factor of multiple sites without organ or systems involvement: Secondary | ICD-10-CM

## 2020-11-27 DIAGNOSIS — Z79899 Other long term (current) drug therapy: Secondary | ICD-10-CM | POA: Diagnosis not present

## 2020-11-27 DIAGNOSIS — M19042 Primary osteoarthritis, left hand: Secondary | ICD-10-CM

## 2020-11-27 DIAGNOSIS — M19041 Primary osteoarthritis, right hand: Secondary | ICD-10-CM | POA: Diagnosis not present

## 2020-11-27 DIAGNOSIS — G5761 Lesion of plantar nerve, right lower limb: Secondary | ICD-10-CM

## 2020-11-27 DIAGNOSIS — M7711 Lateral epicondylitis, right elbow: Secondary | ICD-10-CM

## 2020-11-27 DIAGNOSIS — Z8709 Personal history of other diseases of the respiratory system: Secondary | ICD-10-CM

## 2020-11-27 DIAGNOSIS — M5030 Other cervical disc degeneration, unspecified cervical region: Secondary | ICD-10-CM

## 2020-11-27 DIAGNOSIS — L409 Psoriasis, unspecified: Secondary | ICD-10-CM

## 2020-11-27 DIAGNOSIS — M2242 Chondromalacia patellae, left knee: Secondary | ICD-10-CM

## 2020-11-27 DIAGNOSIS — Z862 Personal history of diseases of the blood and blood-forming organs and certain disorders involving the immune mechanism: Secondary | ICD-10-CM

## 2020-11-27 MED ORDER — PREDNISONE 5 MG PO TABS: ORAL_TABLET | ORAL | 0 refills | Status: DC

## 2020-11-27 NOTE — Patient Instructions (Addendum)
Standing Labs We placed an order today for your standing lab work.   Please have your standing labs drawn in 2 weeks  If possible, please have your labs drawn 2 weeks prior to your appointment so that the provider can discuss your results at your appointment.  We have open lab daily Monday through Thursday from 1:30-4:30 PM and Friday from 1:30-4:00 PM at the office of Dr. Bo Merino, [facility].   Please be advised, all patients with office appointments requiring lab work will take precedents over walk-in lab work.  If possible, please come for your lab work on Monday and Friday afternoons, as you may experience shorter wait times. The office is located at [address], [place], [place], [place] [address] No appointment is necessary.   Labs are drawn by [facility]. Please bring your co-pay at the time of your lab draw.  You may receive a bill from [facility] for your lab work.  If you wish to have your labs drawn at another location, please call the office 24 hours in advance to send orders.  If you have any questions regarding directions or hours of operation,  please call [phone].   As a reminder, please drink plenty of water prior to coming for your lab work. Thanks!  COVID-19 vaccine recommendations:   COVID-19 vaccine is recommended for everyone (unless you are allergic to a vaccine component), even if you are on a medication that suppresses your immune system.   If you are on Cyclophosphamide or Rituxan infusions please contact your doctor prior to receiving the COVID-19 vaccine.   Do not take Tylenol or any anti-inflammatory medications (NSAIDs) 24 hours prior to the COVID-19 vaccination.   There is no direct evidence about the efficacy of the COVID-19 vaccine in individuals who are on medications that suppress the immune system.   Even if you are fully vaccinated, and you are on any medications that suppress your immune system, please continue to wear a  mask, maintain at least six feet social distance and practice hand hygiene.   If you develop a COVID-19 infection, please contact your PCP or our office to determine if you need monoclonal antibody infusion.  The booster vaccine is now available for immunocompromised patients.   Please see the following web sites for updated information.   [email or web address]     Leflunomide tablets What is this medicine? LEFLUNOMIDE (le FLOO na mide) is for rheumatoid arthritis. This medicine may be used for other purposes; ask your health care provider or pharmacist if you have questions. COMMON BRAND NAME(S): Arava What should I tell my health care provider before I take this medicine? They need to know if you have any of these conditions:  diabetes  have a fever or infection  high blood pressure  immune system problems  kidney disease  liver disease  low blood cell counts, like low white cell, platelet, or red cell counts  lung or breathing disease, like asthma  recently received or scheduled to receive a vaccine  receiving treatment for cancer  skin conditions or sensitivity  tingling of the fingers or toes, or other nerve disorder  tuberculosis  an unusual or allergic reaction to leflunomide, teriflunomide, other medicines, food, dyes, or preservatives  pregnant or trying to get pregnant  breast-feeding How should I use this medicine? Take this medicine by mouth with a full glass of water. Follow the directions on the prescription label. Take your medicine at regular intervals. Do not take your medicine more  often than directed. Do not stop taking except on your doctor's advice. Talk to your pediatrician regarding the use of this medicine in children. Special care may be needed. Overdosage: If you think you have taken too much of this medicine contact a poison control center or emergency room at  once. NOTE: This medicine is only for you. Do not share this medicine with others. What if I miss a dose? If you miss a dose, take it as soon as you can. If it is almost time for your next dose, take only that dose. Do not take double or extra doses. What may interact with this medicine? Do not take this medicine with any of the following medications:  teriflunomide This medicine may also interact with the following medications:  alosetron  birth control pills  caffeine  cefaclor  certain medicines for diabetes like nateglinide, repaglinide, rosiglitazone, pioglitazone  certain medicines for high cholesterol like atorvastatin, pravastatin, rosuvastatin, simvastatin  charcoal  cholestyramine  ciprofloxacin  duloxetine  furosemide  ketoprofen  live virus vaccines  medicines that increase your risk for infection  methotrexate  mitoxantrone  paclitaxel  penicillin  theophylline  tizanidine  warfarin This list may not describe all possible interactions. Give your health care provider a list of all the medicines, herbs, non-prescription drugs, or dietary supplements you use. Also tell them if you smoke, drink alcohol, or use illegal drugs. Some items may interact with your medicine. What should I watch for while using this medicine? Visit your health care provider for regular checks on your progress. Tell your doctor or health care provider if your symptoms do not start to get better or if they get worse. You may need blood work done while you are taking this medicine. This medicine may cause serious skin reactions. They can happen weeks to months after starting the medicine. Contact your health care provider right away if you notice fevers or flu-like symptoms with a rash. The rash may be red or purple and then turn into blisters or peeling of the skin. Or, you might notice a red rash with swelling of the face, lips or lymph nodes in your neck or under your  arms. This medicine may stay in your body for up to 2 years after your last dose. Tell your doctor about any unusual side effects or symptoms. A medicine can be given to help lower your blood levels of this medicine more quickly. Women must use effective birth control with this medicine. There is a potential for serious side effects to an unborn child. Do not become pregnant while taking this medicine. Inform your doctor if you wish to become pregnant. This medicine remains in your blood after you stop taking it. You must continue using effective birth control until the blood levels have been checked and they are low enough. A medicine can be given to help lower your blood levels of this medicine more quickly. Immediately talk to your doctor if you think you may be pregnant. You may need a pregnancy test. Talk to your health care provider or pharmacist for more information. You should not receive certain vaccines during your treatment and for a certain time after your treatment with this medication ends. Talk to your health care provider for more information. What side effects may I notice from receiving this medicine? Side effects that you should report to your doctor or health care professional as soon as possible:  allergic reactions like skin rash, itching or hives, swelling of the face,  lips, or tongue  breathing problems  cough  increased blood pressure  low blood counts - this medicine may decrease the number of white blood cells and platelets. You may be at increased risk for infections and bleeding.  pain, tingling, numbness in the hands or feet  rash, fever, and swollen lymph nodes  redness, blistering, peeing or loosening of the skin, including inside the mouth  signs of decreased platelets or bleeding - bruising, pinpoint red spots on the skin, black, tarry stools, blood in urine  signs of infection - fever or chills, cough, sore throat, pain or trouble passing urine  signs and  symptoms of liver injury like dark yellow or brown urine; general ill feeling or flu-like symptoms; light-colored stools; loss of appetite; nausea; right upper belly pain; unusually weak or tired; yellowing of the eyes or skin  trouble passing urine or change in the amount of urine  vomiting Side effects that usually do not require medical attention (report to your doctor or health care professional if they continue or are bothersome):  diarrhea  hair thinning or loss  headache  nausea  tiredness This list may not describe all possible side effects. Call your doctor for medical advice about side effects. You may report side effects to FDA at 1-800-FDA-1088. Where should I keep my medicine? Keep out of the reach of children. Store at room temperature between 15 and 30 degrees C (59 and 86 degrees F). Protect from moisture and light. Throw away any unused medicine after the expiration date. NOTE: This sheet is a summary. It may not cover all possible information. If you have questions about this medicine, talk to your doctor, pharmacist, or health care provider.  2021 Elsevier/Gold Standard (2018-12-09 15:06:48)

## 2020-11-27 NOTE — Telephone Encounter (Signed)
Pending lab results, patient will be starting Bascom 10mg  daily per Hazel Sams, PA-C. Thanks!   Consent obtained and sent to the scan center.

## 2020-11-27 NOTE — Progress Notes (Signed)
Pharmacy Note  Subjective: Patient presents today to the [facility] for follow up office visit.  Patient seen by the pharmacist for counseling on leflunomide Jolee Ewing) for rheumatoid arthritis.  Objective: CBC    Component Value Date/Time   WBC 3.9 09/25/2020 0000   RBC 4.61 09/25/2020 0000   HGB 14.9 09/25/2020 0000   HGB 12.0 02/07/2018 1326   HGB 13.0 02/03/2017 1326   HGB 14.2 10/05/2013 0911   HCT 43.2 09/25/2020 0000   HCT 36.9 02/07/2018 1326   HCT 41.3 10/05/2013 0911   PLT 194 09/25/2020 0000   PLT 264 02/07/2018 1326   MCV 93.7 09/25/2020 0000   MCV 85 02/07/2018 1326   MCV 95.8 10/05/2013 0911   MCH 32.3 09/25/2020 0000   MCHC 34.5 09/25/2020 0000   RDW 12.5 09/25/2020 0000   RDW 15.8 (H) 02/07/2018 1326   RDW 13.6 10/05/2013 0911   LYMPHSABS 1,346 09/25/2020 0000   LYMPHSABS 1.6 02/07/2018 1326   LYMPHSABS 1.1 10/05/2013 0911   MONOABS 0.5 07/05/2018 1421   MONOABS 0.4 10/05/2013 0911   EOSABS 269 09/25/2020 0000   EOSABS 0.2 02/07/2018 1326   BASOSABS 51 09/25/2020 0000   BASOSABS 0.0 02/07/2018 1326   BASOSABS 0.0 10/05/2013 0911    CMP     Component Value Date/Time   NA 144 09/25/2020 0000   NA 143 10/05/2013 0911   K 4.6 09/25/2020 0000   K 4.4 10/05/2013 0911   CL 106 09/25/2020 0000   CL 104 12/26/2012 1213   CO2 32 09/25/2020 0000   CO2 26 10/05/2013 0911   GLUCOSE 59 (L) 09/25/2020 0000   GLUCOSE 94 10/05/2013 0911   GLUCOSE 89 12/26/2012 1213   BUN 18 09/25/2020 0000   BUN 9.7 10/05/2013 0911   CREATININE 0.80 09/25/2020 0000   CREATININE 0.8 10/05/2013 0911   CALCIUM 9.6 09/25/2020 0000   CALCIUM 9.5 10/05/2013 0911   PROT 6.4 09/25/2020 0000   PROT 6.8 10/05/2013 0911   ALBUMIN 4.1 03/31/2017 1655   ALBUMIN 3.9 10/05/2013 0911   AST 17 09/25/2020 0000   AST 19 10/05/2013 0911   ALT 13 09/25/2020 0000   ALT 19 10/05/2013 0911   ALKPHOS 66 03/31/2017 1655   ALKPHOS 57 10/05/2013 0911   BILITOT 0.6 09/25/2020 0000    BILITOT 0.84 10/05/2013 0911   GFRNONAA 82 09/25/2020 0000   GFRAA 96 09/25/2020 0000    Baseline Immunosuppressant Therapy Labs    Hepatitis Latest Ref Rng & Units 01/29/2016  Hep C Ab NEGATIVE NEGATIVE    No results found for: HIV     Serum Protein Electrophoresis Latest Ref Rng & Units 09/25/2020  Total Protein 6.1 - 8.1 g/dL 6.4    No results found for: TPMT   Pregnancy status:  Post-menopausal  Assessment/Plan:  Patient was counseled on the purpose, proper use, and adverse effects of leflunomide including risk of infection, nausea/diarrhea/weight loss, increase in blood pressure, rash, hair loss, tingling in the hands and feet, and signs and symptoms of interstitial lung disease.   Also counseled on Black Box warning of liver injury and importance of avoiding and minimizing alcohol while on therapy. Discussed that there is the possibility of an increased risk of malignancy but it is not well understood if this increased risk is due to the medication or the disease state.  Counseled patient to avoid live vaccines. Recommend annual influenza, Pneumovax 23, Prevnar 13, and Shingrix as indicated.   Discussed the importance of frequent  monitoring of liver function and blood count.  Standing orders placed.  Discussed importance of birth control while on leflunomide due to risk of congenital abnormalities, and patient confirms she is post-menopausal.  Provided patient with educational materials on leflunomide and answered all questions.  Patient consented to [place] use, and consent will be uploaded into the media tab.   Patient will continue on hydroxychloroquine 200mg  BID from Monday through Friday along with leflunomide . Patient's dose will be leflunomide 10mg  once daily.  Prescription pending lab results.  TPMT drawn today as well as other baseline labs: Orders Placed This Encounter  Procedures  . Thiopurine methyltransferase(tpmt)rbc  . CBC with Differential/Platelet  .  Comprehensive Metabolic Panel (CMET)  . QuantiFERON-TB Gold Plus  . Hepatitis B core antibody, IgM  . Hepatitis B surface antigen  . HIV Antibody (routine testing w rflx)  . IgG, IgA, IgM   Knox Saliva, PharmD, MPH Clinical Pharmacist (Rheumatology and Pulmonology)

## 2020-11-28 NOTE — Progress Notes (Signed)
CBC and CMP within normal limits.  HIV negative, hepatitis B negative. IgA slightly elevated. Rest of immunoglobulins WNL.    TB gold pending.

## 2020-11-29 LAB — CBC WITH DIFFERENTIAL/PLATELET
Absolute Monocytes: 409 cells/uL (ref 200–950)
Basophils Absolute: 51 cells/uL (ref 0–200)
Basophils Relative: 1.1 %
Eosinophils Absolute: 230 cells/uL (ref 15–500)
Eosinophils Relative: 5 %
HCT: 42.2 % (ref 35.0–45.0)
Hemoglobin: 14.4 g/dL (ref 11.7–15.5)
Lymphs Abs: 1141 cells/uL (ref 850–3900)
MCH: 32.3 pg (ref 27.0–33.0)
MCHC: 34.1 g/dL (ref 32.0–36.0)
MCV: 94.6 fL (ref 80.0–100.0)
MPV: 11.5 fL (ref 7.5–12.5)
Monocytes Relative: 8.9 %
Neutro Abs: 2769 cells/uL (ref 1500–7800)
Neutrophils Relative %: 60.2 %
Platelets: 207 10*3/uL (ref 140–400)
RBC: 4.46 10*6/uL (ref 3.80–5.10)
RDW: 12.1 % (ref 11.0–15.0)
Total Lymphocyte: 24.8 %
WBC: 4.6 10*3/uL (ref 3.8–10.8)

## 2020-11-29 LAB — COMPREHENSIVE METABOLIC PANEL
AG Ratio: 1.9 (calc) (ref 1.0–2.5)
ALT: 13 U/L (ref 6–29)
AST: 18 U/L (ref 10–35)
Albumin: 4.2 g/dL (ref 3.6–5.1)
Alkaline phosphatase (APISO): 58 U/L (ref 37–153)
BUN: 15 mg/dL (ref 7–25)
CO2: 27 mmol/L (ref 20–32)
Calcium: 9.9 mg/dL (ref 8.6–10.4)
Chloride: 106 mmol/L (ref 98–110)
Creat: 0.65 mg/dL (ref 0.50–1.05)
Globulin: 2.2 g/dL (calc) (ref 1.9–3.7)
Glucose, Bld: 79 mg/dL (ref 65–99)
Potassium: 5.1 mmol/L (ref 3.5–5.3)
Sodium: 144 mmol/L (ref 135–146)
Total Bilirubin: 0.7 mg/dL (ref 0.2–1.2)
Total Protein: 6.4 g/dL (ref 6.1–8.1)

## 2020-11-29 LAB — QUANTIFERON-TB GOLD PLUS
Mitogen-NIL: >10 IU/mL
NIL: 0.05 IU/mL
QuantiFERON-TB Gold Plus: NEGATIVE
TB1-NIL: 0 IU/mL
TB2-NIL: 0.01 IU/mL

## 2020-11-29 LAB — IGG, IGA, IGM
IgG (Immunoglobin G), Serum: 675 mg/dL (ref 600–1640)
IgM, Serum: 80 mg/dL (ref 50–300)
Immunoglobulin A: 420 mg/dL — ABNORMAL HIGH (ref 47–310)

## 2020-11-29 LAB — HEPATITIS B CORE ANTIBODY, IGM: Hep B C IgM: NONREACTIVE

## 2020-11-29 LAB — HEPATITIS B SURFACE ANTIGEN: Hepatitis B Surface Ag: NONREACTIVE

## 2020-11-29 LAB — HIV ANTIBODY (ROUTINE TESTING W REFLEX): HIV 1&2 Ab, 4th Generation: NONREACTIVE

## 2020-12-02 NOTE — Progress Notes (Signed)
TB gold negative

## 2020-12-03 ENCOUNTER — Telehealth: Payer: Self-pay

## 2020-12-03 MED ORDER — LEFLUNOMIDE 10 MG PO TABS: 10.0000 mg | ORAL_TABLET | Freq: Every day | ORAL | 0 refills | Status: DC

## 2020-12-03 NOTE — Telephone Encounter (Signed)
Patient called stating the medication that was called into [facility]) is not the same medication that she discussed with the pharmacist (Azathioprine).  Patient requested a return call.

## 2020-12-03 NOTE — Telephone Encounter (Signed)
Next Visit: 4/202/2022  Last Visit: 11/27/2020  Last Fill: new  DX: Rheumatoid arthritis involving multiple sites with positive rheumatoid factor   Current Dose per office note 11/27/2020,She will start on Arava 10 mg 1 tablet by mouth daily pending lab work obtained today.   Labs:  11/27/2020, CBC and CMP within normal limits. HIV negative, hepatitis B negative. IgA slightly elevated. Rest of immunoglobulins WNL.   TB gold negative.   Okay to refill Arava?

## 2020-12-03 NOTE — Telephone Encounter (Signed)
Discussed with patient that Jolee Ewing is the correct medication. I had mistakenly given her drug information sheet for azathioprine however correct dose and side effects had been discussed.  Re-discussed all counseling points and dosing over the phone.  Patient was counseled on the purpose, proper use, and adverse effects of leflunomide including risk of infection, nausea/diarrhea/weight loss, increase in blood pressure, rash, hair loss, tingling in the hands and feet, and signs and symptoms of interstitial lung disease.   Also counseled on Black Box warning of liver injury and importance of avoiding alcohol while on therapy. Discussed that there is the possibility of an increased risk of malignancy but it is not well understood if this increased risk is due to the medication or the disease state.  Counseled patient to avoid live vaccines while on [place]. Recommend annual influenza, Pneumovax 23, Prevnar 13, and Shingrix as indicated.   Knox Saliva, PharmD, MPH Clinical Pharmacist (Rheumatology and Pulmonology)

## 2020-12-04 ENCOUNTER — Telehealth: Payer: Self-pay

## 2020-12-04 NOTE — Telephone Encounter (Signed)
Patient left a voicemail requesting a return call before she starts her new medication.  Patient states she has been taking the Prednisone taper dose and is still experiencing some pain in her left wrist and arm.  She states she wakes up at night with pins and needles sensation in her arm.  Patient is concerned that there may be something else going on like Carpal Tunnel syndrome and wants to discuss this before taking the medication.

## 2020-12-04 NOTE — Telephone Encounter (Signed)
Please advise. Thank you

## 2020-12-04 NOTE — Telephone Encounter (Signed)
Ok to place referral for NCV with EMG for further evaluation of the paresthesias in the left hand.   She could have CTS, but she was experiencing pain in multiple joints at her last visit, which was why we recommended proceeding with arava.

## 2020-12-04 NOTE — Telephone Encounter (Signed)
Patient would like to schedule an appt with Dr. Louanne Skye first.

## 2020-12-05 ENCOUNTER — Ambulatory Visit (INDEPENDENT_AMBULATORY_CARE_PROVIDER_SITE_OTHER): Payer: 59 | Admitting: Surgery

## 2020-12-05 ENCOUNTER — Encounter: Payer: Self-pay | Admitting: Surgery

## 2020-12-05 ENCOUNTER — Telehealth: Payer: Self-pay | Admitting: Surgery

## 2020-12-05 ENCOUNTER — Other Ambulatory Visit: Payer: Self-pay

## 2020-12-05 VITALS — BP 113/67 | HR 57 | Ht 66.0 in | Wt 168.6 lb

## 2020-12-05 DIAGNOSIS — M5412 Radiculopathy, cervical region: Secondary | ICD-10-CM

## 2020-12-05 NOTE — Telephone Encounter (Signed)
Patient called  advised the doctor Rolm Gala wanted her to start taking [place]. Patient asked would Jeneen Rinks rather her hold off  taking the medication untill after seeing him in 2 weeks? The number to contact patient is [phone]

## 2020-12-05 NOTE — Telephone Encounter (Signed)
Please advise on message below. Thank you!

## 2020-12-05 NOTE — Progress Notes (Signed)
Office Visit Note   Patient: JENNINE PEDDY           Date of Birth: Oct 01, 1965           MRN: [id number] Visit Date: 12/05/2020              Requested by: Nunzio Cobbs, MD [address],  [place] [address] PCP: Nunzio Cobbs, MD   Assessment & Plan: Visit Diagnoses:  1. 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  Plan: Since patient was prescribed 16-day prednisone taper by rheumatology office last week I will give this a little more time to see how she does.  Patient will follow up with me in 2 weeks for recheck.  At that time I will make decision as to whether or not she needs repeat cervical spine MRI to compare to the study that was done last year and/or NCV/EMG study left upper extremity to rule out peripheral entrapment neuropathy with left cubital tunnel and left carpal tunnel syndrome.  Advised patient that she may have a double crush phenomenon.  All questions answered and she voices understanding of today's plan.  She will contact me and return the office sooner if needed.  Follow-Up Instructions: Return in about 2 weeks (around 12/19/2020) for with Ziana Heyliger recheck cervical spine.   Orders:  No orders of the defined types were placed in this encounter.  No orders of the defined types were placed in this encounter.     Procedures: No procedures performed   Clinical Data: No additional findings.   Subjective: Chief Complaint  Patient presents with   Left Arm - Numbness   Left Wrist - Numbness    HPI  Review of Systems No current cardiac pulmonary GI GU issues  Objective: Vital Signs: BP 113/67   Pulse (!) 57   Ht 5\' 6"  (1.676 m)   Wt 168 lb 9.6 oz (76.5 kg)   LMP 11/12/2014   BMI 27.21 kg/m   Physical Exam HENT:     Head: Normocephalic.  Eyes:     Extraocular Movements: Extraocular movements intact.  Pulmonary:     Effort: No  respiratory distress.  Musculoskeletal:     Comments: Gait is normal.  Cervical spine good range of motion.  Positive moderate left brachial plexus trapezius and scapular tenderness.  Negative on the right side.  Negative Spurling test.  Mildly positive impingement test left shoulder.  Bilateral elbows good range of motion.  Negative elbow flexion test.  Positive Tinel's left cubital tunnel.  Bilateral wrist good range of motion.  She does have some dorsal left wrist tenderness to palpation and there is slight swelling in this area.  Left wrist positive Tinel's and Phalen's.  Negative on the right side.  No focal motor deficits.  Neurological:     Mental Status: She is alert and oriented to person, place, and time.  Psychiatric:        Mood and Affect: Mood normal.     Ortho Exam  Specialty Comments:  No specialty comments available.  Imaging: No results found.   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   Blood transfusion without reported diagnosis 1976   1976  following surgery at [facility]) 08/2008   left breast--has bilateral mastectomy   Complication of anesthesia    pt states" gets shakes" with general   Cyst  near tailbone    Elevated LDL cholesterol level    Environmental and seasonal allergies    Fibroid    Headache    sleeps , no meds   Heart murmur    never has caused any problems   Helicobacter pylori infection    Hemorrhoids    hx of   History of breast cancer    History of shingles    03/07/18   Hypotension    hx of   Post-operative nausea and vomiting    RA (rheumatoid arthritis) ([facility])    RA (rheumatoid arthritis) ([facility])    Scoliosis    tx with robaxin prn - rarely uses   SVD (spontaneous vaginal delivery) 1996   x 1    Family History  Problem Relation Age of Onset   Breast cancer Mother    Diabetes Mother    Hypertension Mother    Stroke Mother    Irritable bowel syndrome Mother    Kidney disease Mother    Heart failure Father    Hypertension Father    Colon cancer Neg Hx    Colon polyps Neg Hx    Esophageal cancer Neg Hx    Pancreatic cancer Neg Hx    Rectal cancer Neg Hx    Stomach cancer Neg Hx     Past Surgical History:  Procedure Laterality Date   ABDOMINAL HYSTERECTOMY     APPENDECTOMY     BREAST LUMPECTOMY  05/17/2008   x 2 re excise for margin - Dr Margot Chimes, bilateral mastectomy 08/2008   BREAST LUMPECTOMY W/ NEEDLE LOCALIZATION  04/23/2008   w [facility] Dr Margot Chimes   CESAREAN SECTION  01/01/2004   Dr Quincy Simmonds   COLONOSCOPY  03/2018   COLONOSCOPY  05/05/2019   ENDOMETRIAL BIOPSY  06/2013, 2015   x 2   EYE SURGERY  2003   bilateral lasik   FOOT SURGERY     HYSTEROSCOPY WITH D & C  07/21/2011   Procedure: DILATATION AND CURETTAGE (D&C) /HYSTEROSCOPY;  Surgeon: Arloa Koh;  Location: [facility];  Service: Gynecology;  Laterality: N/A;  with Ultrasound Guidance   INSERTION OF TISSUE EXPANDER AFTER MASTECTOMY  09/11/2008   Dr Harlow Mares   KNEE ARTHROSCOPY W/ MENISCAL REPAIR Right 2010   --Dr. Telford Nab   LAPAROSCOPIC ASSISTED VAGINAL HYSTERECTOMY N/A 01/15/2015   Procedure: LAPAROSCOPIC ASSISTED VAGINAL HYSTERECTOMY;  Surgeon: Nunzio Cobbs, MD;  Location: [facility];  Service: Gynecology;  Laterality: N/A;   left foot surgery  2004   left rotator cuff repair  01/2006   MASTECTOMY  09/11/2008   bilateral - Dr Margot Chimes   [facility] REMOVAL  09/09/2009   Dr Margot Chimes   [facility] PLACEMENT  05/17/2008   Dr  Streck/3 rounds of chemo   REMOVAL OF TISSUE EXPANDER AND PLACEMENT OF IMPLANT  12/2008   RHINOPLASTY     SALPINGOOPHORECTOMY Bilateral 01/15/2015   Procedure: BILATERAL SALPINGO OOPHORECTOMY;  Surgeon: Nunzio Cobbs, MD;  Location: [facility];  Service: Gynecology;  Laterality: Bilateral;   UMBILICAL HERNIA REPAIR  09/09/2009   Dr Margot Chimes   UPPER GI ENDOSCOPY  03/2018   WISDOM TOOTH EXTRACTION     Social History   Occupational History   Not on file  Tobacco Use   Smoking status: Never Smoker   Smokeless tobacco: Never Used  Vaping Use   Vaping Use: Never used  Substance and Sexual Activity   Alcohol use: Yes    Alcohol/week: 2.0 standard drinks    Types: 2 Cans of beer per week    Comment: occ glass of wine or beer   Drug use: No   Sexual activity: Yes    Partners: Male    Birth control/protection: Post-menopausal    Comment: Hyst/BSO

## 2020-12-10 NOTE — Telephone Encounter (Signed)
Must take medication as recommended by rheumatologist.  it will not change my plan.

## 2020-12-10 NOTE — Telephone Encounter (Signed)
Talked with patient and advised her of message below.

## 2020-12-19 ENCOUNTER — Ambulatory Visit: Payer: 59 | Admitting: Surgery

## 2020-12-19 ENCOUNTER — Encounter: Payer: Self-pay | Admitting: Surgery

## 2020-12-19 DIAGNOSIS — G5692 Unspecified mononeuropathy of left upper limb: Secondary | ICD-10-CM | POA: Diagnosis not present

## 2020-12-19 DIAGNOSIS — M7061 Trochanteric bursitis, right hip: Secondary | ICD-10-CM

## 2020-12-19 DIAGNOSIS — M4722 Other spondylosis with radiculopathy, cervical region: Secondary | ICD-10-CM | POA: Diagnosis not present

## 2020-12-19 DIAGNOSIS — M5412 Radiculopathy, cervical region: Secondary | ICD-10-CM

## 2020-12-19 DIAGNOSIS — M7062 Trochanteric bursitis, left hip: Secondary | ICD-10-CM

## 2020-12-19 NOTE — Progress Notes (Signed)
Office Visit Note   Patient: Rebecca Fleming           Date of Birth: Jul 24, 1966           MRN: [id number] Visit Date: 12/19/2020              Requested by: Nunzio Cobbs, MD [address],  [place] [id number] PCP: Nunzio Cobbs, MD   Assessment & Plan: Visit Diagnoses:  1. Radiculopathy, cervical region   2. Neuropathy, arm, left   3. Other spondylosis with radiculopathy, cervical region   4. Trochanteric bursitis, left hip   5. Trochanteric bursitis, right hip     Plan: Since patient has not had any improvement with conservative management I recommend getting cervical spine MRI and comparing to the study that was done almost a year ago.  I will also get NCV/EMG study left upper extremity to rule out cervical radiculopathy and peripheral nerve entrapment neuropathy.  Follow-up with Dr. Louanne Skye after completion of both studies to discuss results and further treatment options.  In regards to her bilateral hip greater trochanteric bursitis I encouraged her to do IT band stretching exercises more frequently.  She did did demonstrate these to me today.  If she does not have any improvement we did discuss possibly doing injections there in the future.  She will also keep an eye on the intermittent numbness and tingling that she is having in her left thigh.  May consider getting a lumbar x-ray next office visit.  Follow-Up Instructions: Return in about 3 weeks (around 01/09/2021) for with dr Louanne Skye to review cervical spine mri and ncv/emg study.   Orders:  Orders Placed This Encounter  Procedures  . MR Cervical Spine w/o contrast  . Ambulatory referral to Physical Medicine Rehab   No orders of the defined types were placed in this encounter.     Procedures: No procedures performed   Clinical Data: No additional findings.   Subjective: Chief Complaint  Patient presents with  . Neck - Follow-up, Pain, Numbness, Tingling     HPI 55 year old white female returns for recheck of her neck pain and left upper extremity neuropathy.  States that symptoms unchanged from previous visit.  No relief with long course of prednisone prescribed by rheumatology office.  Patient also states that she has been having ongoing pain in the bilateral lateral hips.  States that she has a known history of IT band tightness and occasionally does stretching exercises for this.  Also has been having some intermittent episodes of numbness and tingling in the left thigh.  Was not having this last office visit.  No complaints of back pain.  States that she does have a history of scoliosis.   Objective: Vital Signs: LMP 11/12/2014   Physical Exam Gait is normal.  Left greater than right brachial plexus, trapezius and scapular tenderness.  Positive Tinel's left cubital tunnel.  Negative log about his.  Negative straight leg raise.  No pain with lumbar extension.  Moderate to marked tenderness over the bilateral greater trochanters and this tenderness also extends down the course of her IT band.  No focal motor deficits. Ortho Exam  Specialty Comments:  No specialty comments available.  Imaging: No results found.   PMFS History: Patient Active Problem List   Diagnosis Date Noted  . Primary osteoarthritis of both knees 09/01/2017  . Scoliosis 09/01/2017  . High risk medication use 11/11/2016  . Primary osteoarthritis of  both hands 11/11/2016  . Lateral epicondylitis, right elbow 11/11/2016  . Myopia with astigmatism and presbyopia, bilateral 09/09/2016  . 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 Status post laparoscopic assisted vaginal hysterectomy (LAVH) 01/15/2015  . Breast cancer, left breast ([place]) 11/07/2014  . Postmenopausal bleeding 07/13/2013  . Rheumatoid arthritis ([place]) 01/11/2012  . Psoriasis 01/11/2012   Past Medical History:  Diagnosis Date  . Abnormal uterine bleeding   . Allergy   . Anemia    . Arthritis   . Asthma    exercised induced asthma - rarely uses inhaler;none since Chemo.  . Blood transfusion without reported diagnosis 1976   1976  following surgery at [facility]   . Cancer ([place]) 08/2008   left breast--has bilateral mastectomy  . Complication of anesthesia    pt states" gets shakes" with general  . Cyst near tailbone   . Elevated LDL cholesterol level   . Environmental and seasonal allergies   . Fibroid   . Headache    sleeps , no meds  . Heart murmur    never has caused any problems  . Helicobacter pylori infection   . Hemorrhoids    hx of  . History of breast cancer   . History of shingles    03/07/18  . Hypotension    hx of  . Post-operative nausea and vomiting   . RA (rheumatoid arthritis) ([place])   . RA (rheumatoid arthritis) ([place])   . Scoliosis    tx with robaxin prn - rarely uses  . SVD (spontaneous vaginal delivery) 1996   x 1    Family History  Problem Relation Age of Onset  . Breast cancer Mother   . Diabetes Mother   . Hypertension Mother   . Stroke Mother   . Irritable bowel syndrome Mother   . Kidney disease Mother   . Heart failure Father   . Hypertension Father   . Colon cancer Neg Hx   . Colon polyps Neg Hx   . Esophageal cancer Neg Hx   . Pancreatic cancer Neg Hx   . Rectal cancer Neg Hx   . Stomach cancer Neg Hx     Past Surgical History:  Procedure Laterality Date  . ABDOMINAL HYSTERECTOMY    . APPENDECTOMY    . BREAST LUMPECTOMY  05/17/2008   x 2 re excise for margin - Dr Margot Chimes, bilateral mastectomy 08/2008  . BREAST LUMPECTOMY W/ NEEDLE LOCALIZATION  04/23/2008   w [facility] Dr Margot Chimes  . CESAREAN SECTION  01/01/2004   Dr Quincy Simmonds  . COLONOSCOPY  03/2018  . COLONOSCOPY  05/05/2019  . ENDOMETRIAL BIOPSY  06/2013, 2015   x 2  . EYE SURGERY  2003   bilateral lasik  . FOOT SURGERY    . HYSTEROSCOPY WITH D & C  07/21/2011   Procedure: DILATATION AND CURETTAGE (D&C) /HYSTEROSCOPY;  Surgeon: Arloa Koh;  Location: [facility];   Service: Gynecology;  Laterality: N/A;  with Ultrasound Guidance  . INSERTION OF TISSUE EXPANDER AFTER MASTECTOMY  09/11/2008   Dr Harlow Mares  . KNEE ARTHROSCOPY W/ MENISCAL REPAIR Right 2010   --Dr. Telford Nab  . LAPAROSCOPIC ASSISTED VAGINAL HYSTERECTOMY N/A 01/15/2015   Procedure: LAPAROSCOPIC ASSISTED VAGINAL HYSTERECTOMY;  Surgeon: Nunzio Cobbs, MD;  Location: [facility];  Service: Gynecology;  Laterality: N/A;  . left foot surgery  2004  . left rotator cuff repair  01/2006  . MASTECTOMY  09/11/2008   bilateral - Dr Margot Chimes  .  PORT-A-CATH REMOVAL  09/09/2009   Dr Margot Chimes  . PORTACATH PLACEMENT  05/17/2008   Dr Cori Razor rounds of chemo  . REMOVAL OF TISSUE EXPANDER AND PLACEMENT OF IMPLANT  12/2008  . RHINOPLASTY    . SALPINGOOPHORECTOMY Bilateral 01/15/2015   Procedure: BILATERAL SALPINGO OOPHORECTOMY;  Surgeon: Nunzio Cobbs, MD;  Location: [facility];  Service: Gynecology;  Laterality: Bilateral;  . UMBILICAL HERNIA REPAIR  09/09/2009   Dr Margot Chimes  . UPPER GI ENDOSCOPY  03/2018  . WISDOM TOOTH EXTRACTION     Social History   Occupational History  . Not on file  Tobacco Use  . Smoking status: Never Smoker  . Smokeless tobacco: Never Used  Vaping Use  . Vaping Use: Never used  Substance and Sexual Activity  . Alcohol use: Yes    Alcohol/week: 2.0 standard drinks    Types: 2 Cans of beer per week    Comment: occ glass of wine or beer  . Drug use: No  . Sexual activity: Yes    Partners: Male    Birth control/protection: Post-menopausal    Comment: Hyst/BSO

## 2020-12-24 ENCOUNTER — Telehealth: Payer: Self-pay | Admitting: Physical Medicine and Rehabilitation

## 2020-12-24 ENCOUNTER — Other Ambulatory Visit: Payer: Self-pay | Admitting: Physician Assistant

## 2020-12-24 DIAGNOSIS — M0579 Rheumatoid arthritis with rheumatoid factor of multiple sites without organ or systems involvement: Secondary | ICD-10-CM

## 2020-12-24 NOTE — Telephone Encounter (Signed)
Called pt and sch. 

## 2020-12-24 NOTE — Telephone Encounter (Signed)
Pt called and state shena called her and she would like a call back carotid bruit [phone]

## 2020-12-24 NOTE — Telephone Encounter (Signed)
Last Visit: 11/27/2020 Next Visit: 01/08/2021 Labs: 11/27/2020, CBC and CMP within normal limits. HIV negative, hepatitis B negative. IgA slightly elevated. Rest of immunoglobulins WNL.   Eye exam: 09/30/2020  Current Dose per office note 11/27/2020,  Plaquenil 200 mg 1 tablet by mouth twice daily Monday through Friday.  DX:  Rheumatoid arthritis involving multiple sites with positive rheumatoid factor   Last Fill: 10/10/2020  Okay to refill Plaquenil?

## 2020-12-27 NOTE — Progress Notes (Deleted)
Office Visit Note  Patient: Rebecca Fleming             Date of Birth: 15-Nov-1965           MRN: [id number]             PCP: Nunzio Cobbs, MD Referring: Aundria Rud* Visit Date: 01/08/2021 Occupation: @GUAROCC @  Subjective:  No chief complaint on file.   History of Present Illness: Rebecca Fleming is a 55 y.o. female ***   Activities of Daily Living:  Patient reports morning stiffness for *** {minute/hour:19697}.   Patient {ACTIONS;DENIES/REPORTS:21021675::"Denies"} nocturnal pain.  Difficulty dressing/grooming: {ACTIONS;DENIES/REPORTS:21021675::"Denies"} Difficulty climbing stairs: {ACTIONS;DENIES/REPORTS:21021675::"Denies"} Difficulty getting out of chair: {ACTIONS;DENIES/REPORTS:21021675::"Denies"} Difficulty using hands for taps, buttons, cutlery, and/or writing: {ACTIONS;DENIES/REPORTS:21021675::"Denies"}  No Rheumatology ROS completed.   PMFS History:  Patient Active Problem List   Diagnosis Date Noted  . Primary osteoarthritis of both knees 09/01/2017  . Scoliosis 09/01/2017  . High risk medication use 11/11/2016  . Primary osteoarthritis of both hands 11/11/2016  . Lateral epicondylitis, right elbow 11/11/2016  . Myopia with astigmatism and presbyopia, bilateral 09/09/2016  . 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 Status post laparoscopic assisted vaginal hysterectomy (LAVH) 01/15/2015  . Breast cancer, left breast ([place]) 11/07/2014  . Postmenopausal bleeding 07/13/2013  . Rheumatoid arthritis ([place]) 01/11/2012  . Psoriasis 01/11/2012    Past Medical History:  Diagnosis Date  . Abnormal uterine bleeding   . Allergy   . Anemia   . Arthritis   . Asthma    exercised induced asthma - rarely uses inhaler;none since Chemo.  . Blood transfusion without reported diagnosis 1976   1976  following surgery at [facility]   . Cancer ([place]) 08/2008   left breast--has bilateral mastectomy  . Complication of anesthesia    pt states"  gets shakes" with general  . Cyst near tailbone   . Elevated LDL cholesterol level   . Environmental and seasonal allergies   . Fibroid   . Headache    sleeps , no meds  . Heart murmur    never has caused any problems  . Helicobacter pylori infection   . Hemorrhoids    hx of  . History of breast cancer   . History of shingles    03/07/18  . Hypotension    hx of  . Post-operative nausea and vomiting   . RA (rheumatoid arthritis) ([place])   . RA (rheumatoid arthritis) ([place])   . Scoliosis    tx with robaxin prn - rarely uses  . SVD (spontaneous vaginal delivery) 1996   x 1    Family History  Problem Relation Age of Onset  . Breast cancer Mother   . Diabetes Mother   . Hypertension Mother   . Stroke Mother   . Irritable bowel syndrome Mother   . Kidney disease Mother   . Heart failure Father   . Hypertension Father   . Colon cancer Neg Hx   . Colon polyps Neg Hx   . Esophageal cancer Neg Hx   . Pancreatic cancer Neg Hx   . Rectal cancer Neg Hx   . Stomach cancer Neg Hx    Past Surgical History:  Procedure Laterality Date  . ABDOMINAL HYSTERECTOMY    . APPENDECTOMY    . BREAST LUMPECTOMY  05/17/2008   x 2 re excise for margin - Dr Margot Chimes, bilateral mastectomy 08/2008  . BREAST LUMPECTOMY W/ NEEDLE LOCALIZATION  04/23/2008  w SL[facility] Dr Margot Chimes  . CESAREAN SECTION  01/01/2004   Dr Quincy Simmonds  . COLONOSCOPY  03/2018  . COLONOSCOPY  05/05/2019  . ENDOMETRIAL BIOPSY  06/2013, 2015   x 2  . EYE SURGERY  2003   bilateral lasik  . FOOT SURGERY    . HYSTEROSCOPY WITH D & C  07/21/2011   Procedure: DILATATION AND CURETTAGE (D&C) /HYSTEROSCOPY;  Surgeon: Arloa Koh;  Location: [facility];  Service: Gynecology;  Laterality: N/A;  with Ultrasound Guidance  . INSERTION OF TISSUE EXPANDER AFTER MASTECTOMY  09/11/2008   Dr Harlow Mares  . KNEE ARTHROSCOPY W/ MENISCAL REPAIR Right 2010   --Dr. Telford Nab  . LAPAROSCOPIC ASSISTED VAGINAL HYSTERECTOMY N/A 01/15/2015   Procedure: LAPAROSCOPIC  ASSISTED VAGINAL HYSTERECTOMY;  Surgeon: Nunzio Cobbs, MD;  Location: [facility];  Service: Gynecology;  Laterality: N/A;  . left foot surgery  2004  . left rotator cuff repair  01/2006  . MASTECTOMY  09/11/2008   bilateral - Dr Margot Chimes  . PORT-A-CATH REMOVAL  09/09/2009   Dr Margot Chimes  . PORTACATH PLACEMENT  05/17/2008   Dr Cori Razor rounds of chemo  . REMOVAL OF TISSUE EXPANDER AND PLACEMENT OF IMPLANT  12/2008  . RHINOPLASTY    . SALPINGOOPHORECTOMY Bilateral 01/15/2015   Procedure: BILATERAL SALPINGO OOPHORECTOMY;  Surgeon: Nunzio Cobbs, MD;  Location: [facility];  Service: Gynecology;  Laterality: Bilateral;  . UMBILICAL HERNIA REPAIR  09/09/2009   Dr Margot Chimes  . UPPER GI ENDOSCOPY  03/2018  . WISDOM TOOTH EXTRACTION     Social History   Social History Narrative  . Not on file   Immunization History  Administered Date(s) Administered  . Influenza,inj,Quad PF,6+ Mos 07/12/2019  . PFIZER(Purple Top)SARS-COV-2 Vaccination 10/05/2019, 10/25/2019, 05/11/2020  . Pneumococcal Polysaccharide-23 01/16/2015  . Tdap 09/21/2010, 07/12/2019     Objective: Vital Signs: LMP 11/12/2014    Physical Exam   Musculoskeletal Exam: ***  CDAI Exam: CDAI Score: -- Patient Global: --; Provider Global: -- Swollen: --; Tender: -- Joint Exam 01/08/2021   No joint exam has been documented for this visit   There is currently no information documented on the homunculus. Go to the Rheumatology activity and complete the homunculus joint exam.  Investigation: No additional findings.  Imaging: No results found.  Recent Labs: Lab Results  Component Value Date   WBC 4.6 11/27/2020   HGB 14.4 11/27/2020   PLT 207 11/27/2020   NA 144 11/27/2020   K 5.1 11/27/2020   CL 106 11/27/2020   CO2 27 11/27/2020   GLUCOSE 79 11/27/2020   BUN 15 11/27/2020   CREATININE 0.65 11/27/2020   BILITOT 0.7 11/27/2020   ALKPHOS 66 03/31/2017   AST 18 11/27/2020   ALT 13 11/27/2020   PROT 6.4  11/27/2020   ALBUMIN 4.1 03/31/2017   CALCIUM 9.9 11/27/2020   GFRAA 96 09/25/2020   QFTBGOLDPLUS NEGATIVE 11/27/2020    Speciality Comments: PLQ Eye Exam: 09/30/2020  WNL @ [facility] [phone] . Follow up in 1 year..  Procedures:  No procedures performed Allergies: Latex, Other, and Percocet [oxycodone-acetaminophen]   Assessment / Plan:     Visit Diagnoses: No diagnosis found.  Orders: No orders of the defined types were placed in this encounter.  No orders of the defined types were placed in this encounter.   Face-to-face time spent with patient was *** minutes. Greater than 50% of time was spent in counseling and coordination of care.  Follow-Up Instructions: No follow-ups on file.   Earnestine Mealing, CMA  Note - This record has been created using Editor, commissioning.  Chart creation errors have been sought, but may not always  have been located. Such creation errors do not reflect on  the standard of medical care.

## 2021-01-01 ENCOUNTER — Other Ambulatory Visit: Payer: 59

## 2021-01-08 ENCOUNTER — Ambulatory Visit: Payer: 59 | Admitting: Physician Assistant

## 2021-01-08 ENCOUNTER — Telehealth: Payer: Self-pay | Admitting: *Deleted

## 2021-01-08 DIAGNOSIS — M720 Palmar fascial fibromatosis [Dupuytren]: Secondary | ICD-10-CM

## 2021-01-08 DIAGNOSIS — R011 Cardiac murmur, unspecified: Secondary | ICD-10-CM

## 2021-01-08 DIAGNOSIS — M170 Bilateral primary osteoarthritis of knee: Secondary | ICD-10-CM

## 2021-01-08 DIAGNOSIS — M19041 Primary osteoarthritis, right hand: Secondary | ICD-10-CM

## 2021-01-08 DIAGNOSIS — M7711 Lateral epicondylitis, right elbow: Secondary | ICD-10-CM

## 2021-01-08 DIAGNOSIS — M0579 Rheumatoid arthritis with rheumatoid factor of multiple sites without organ or systems involvement: Secondary | ICD-10-CM

## 2021-01-08 DIAGNOSIS — M2242 Chondromalacia patellae, left knee: Secondary | ICD-10-CM

## 2021-01-08 DIAGNOSIS — Z862 Personal history of diseases of the blood and blood-forming organs and certain disorders involving the immune mechanism: Secondary | ICD-10-CM

## 2021-01-08 DIAGNOSIS — Z79899 Other long term (current) drug therapy: Secondary | ICD-10-CM

## 2021-01-08 DIAGNOSIS — M5030 Other cervical disc degeneration, unspecified cervical region: Secondary | ICD-10-CM

## 2021-01-08 DIAGNOSIS — Z8709 Personal history of other diseases of the respiratory system: Secondary | ICD-10-CM

## 2021-01-08 DIAGNOSIS — M8589 Other specified disorders of bone density and structure, multiple sites: Secondary | ICD-10-CM

## 2021-01-08 DIAGNOSIS — G5761 Lesion of plantar nerve, right lower limb: Secondary | ICD-10-CM

## 2021-01-08 DIAGNOSIS — M419 Scoliosis, unspecified: Secondary | ICD-10-CM

## 2021-01-08 DIAGNOSIS — Z853 Personal history of malignant neoplasm of breast: Secondary | ICD-10-CM

## 2021-01-08 DIAGNOSIS — L409 Psoriasis, unspecified: Secondary | ICD-10-CM

## 2021-01-08 NOTE — Telephone Encounter (Signed)
Pt is scheduled for EMG/NCV on 01/09/21 at 830am at [facility]. Pt is aware

## 2021-01-13 ENCOUNTER — Encounter: Payer: Self-pay | Admitting: Specialist

## 2021-01-13 ENCOUNTER — Ambulatory Visit: Payer: 59 | Admitting: Specialist

## 2021-01-13 ENCOUNTER — Telehealth: Payer: Self-pay

## 2021-01-13 ENCOUNTER — Ambulatory Visit (INDEPENDENT_AMBULATORY_CARE_PROVIDER_SITE_OTHER): Payer: 59 | Admitting: Specialist

## 2021-01-13 ENCOUNTER — Other Ambulatory Visit: Payer: Self-pay

## 2021-01-13 VITALS — BP 121/63 | HR 59 | Ht 65.0 in | Wt 160.0 lb

## 2021-01-13 VITALS — BP 121/63 | HR 59

## 2021-01-13 DIAGNOSIS — G5632 Lesion of radial nerve, left upper limb: Secondary | ICD-10-CM

## 2021-01-13 DIAGNOSIS — M542 Cervicalgia: Secondary | ICD-10-CM

## 2021-01-13 DIAGNOSIS — M7702 Medial epicondylitis, left elbow: Secondary | ICD-10-CM

## 2021-01-13 NOTE — Telephone Encounter (Signed)
Patient had a scheduled appointment today at 1:30 to review MRI & NCS/EMG. Patient had to Cx Appt due to MRI being Denied & NCS/EMG report not ready. I called [facility] to try to get report for NCS. They state they do not have it in the system yet. They will fax when uploaded/ready.   Patient was upset and would like to be worked in [place] (once we get report).

## 2021-01-13 NOTE — Telephone Encounter (Signed)
Patient called [place] after leaving our office. They state they did have results and that it was there fault. They state they will fax over results ASAP. Per Dr Louanne Skye, okay to work back in schedule.    Please disregard msg. Thanks.

## 2021-01-13 NOTE — Progress Notes (Signed)
Office Visit Note   Patient: Rebecca Fleming           Date of Birth: 11-15-1965           MRN: [id number] Visit Date: 01/13/2021              Requested by: Nunzio Cobbs, MD [place],  [place] [address] PCP: Nunzio Cobbs, MD   Assessment & Plan: Visit Diagnoses:  1. Radial tunnel syndrome, left   2. Medial epicondylitis of elbow, left     Plan: Rest the left elbow and hand, a sling for 1-2 weeks. EMG and NCV can be negative with radial tunnel syndrome and medial epicondylitis. I am referring you to Dr. Ninfa Linden for consideration of a left radial tunnel release. Xrays of the left elbow and forearm should be done at that visit.  Gabapentin 100mg   At night for nerve pain. Voltaren gel applied to the medial elbow and over the lateral ulna area 4 gm 3-4 times per day.  Follow-Up Instructions: Return in about 4 weeks (around 02/10/2021).   Orders:  No orders of the defined types were placed in this encounter.  No orders of the defined types were placed in this encounter.     Procedures: No procedures performed   Clinical Data: No additional findings.   Subjective: Chief Complaint  Patient presents with  . Neck - Pain    55 year old right handed female with history of cervicalgia and previous evaluation about one year ago for neck and left shoulder pain. She had recurrent pain at the end of February about 2 months ago while in [place] and she noted onset of left lateral arm pain with radiation into the lateral elbow and left lateral wrist and into the lateral dorsal elbow and it is worse at night and the left elbow is painful. There is left arm weakness, she teaches dance aerobics and she is not able to hold 5lb weights and had to go to lower weight. Typing with difficulty. 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 Able to walk fine, having numbness in the left leg and  found to have ITB tight ness. No tripping or falling No bowel or bladder difficulty.  Cough or sneeze is not painful.    Review of Systems  Constitutional: Negative.  Negative for activity change, appetite change, chills, diaphoresis, fatigue, fever and unexpected weight change.  HENT: Positive for congestion, sinus pressure, sneezing and sore throat. Negative for dental problem, drooling, ear discharge, ear pain, facial swelling, hearing loss, mouth sores, nosebleeds, postnasal drip, rhinorrhea, tinnitus, trouble swallowing and voice change.   Eyes: Negative.  Negative for photophobia, pain, discharge, redness, itching and visual disturbance.  Respiratory: Negative.  Negative for apnea, cough, choking, chest tightness, shortness of breath, wheezing and stridor.   Cardiovascular: Positive for chest pain. Negative for palpitations and leg swelling.  Gastrointestinal: Negative.  Negative for abdominal distention, abdominal pain, anal bleeding, blood in stool, constipation, diarrhea, nausea, rectal pain and vomiting.  Endocrine: Negative for heat intolerance, polydipsia, polyphagia and polyuria.  Genitourinary: Negative.   Musculoskeletal: Positive for arthralgias, back pain, joint swelling, neck pain and neck stiffness. Negative for gait problem and myalgias.  Allergic/Immunologic: Positive for environmental allergies. Negative for food allergies and immunocompromised state.  Neurological: Positive for syncope, weakness and numbness. Negative for dizziness, tremors, seizures, facial asymmetry, speech difficulty, light-headedness and  headaches.  Hematological: Negative.  Negative for adenopathy. Does not bruise/bleed easily.  Psychiatric/Behavioral: Negative for agitation, behavioral problems, confusion, decreased concentration, dysphoric mood, hallucinations, self-injury, sleep disturbance and suicidal ideas. The patient is not nervous/anxious and is not hyperactive.      Objective: Vital Signs:  BP 121/63   Pulse (!) 59   Ht 5\' 5"  (1.651 m)   Wt 160 lb (72.6 kg)   LMP 11/12/2014   BMI 26.63 kg/m   Physical Exam  Right Elbow Exam   Muscle Strength  Pronation:  5/5  Supination:  5/5   Tests  Valgus: negative Tinel's sign (cubital tunnel): negative  Other  Erythema: absent Scars: absent Sensation: normal Pulse: present   Left Elbow Exam   Tenderness  The patient is experiencing tenderness in the medial epicondyle and radial head.   Muscle Strength  Pronation:  5/5  Supination:  5/5   Tests  Varus: negative Valgus: positive  Other  Erythema: absent Scars: absent Sensation: normal Pulse: present  Comments:  Radial tunnel test, provocative test are positive, including supination of the left hand and wrist against resistance and left middle finger dorsiflexion against.      Specialty Comments:  No specialty comments available.  Imaging: No results found.   PMFS History: Patient Active Problem List   Diagnosis Date Noted  . Primary osteoarthritis of both knees 09/01/2017  . Scoliosis 09/01/2017  . High risk medication use 11/11/2016  . Primary osteoarthritis of both hands 11/11/2016  . Lateral epicondylitis, right elbow 11/11/2016  . Myopia with astigmatism and presbyopia, bilateral 09/09/2016  . 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 Status post laparoscopic assisted vaginal hysterectomy (LAVH) 01/15/2015  . Breast cancer, left breast ([place]) 11/07/2014  . Postmenopausal bleeding 07/13/2013  . Rheumatoid arthritis ([place]) 01/11/2012  . Psoriasis 01/11/2012   Past Medical History:  Diagnosis Date  . Abnormal uterine bleeding   . Allergy   . Anemia   . Arthritis   . Asthma    exercised induced asthma - rarely uses inhaler;none since Chemo.  . Blood transfusion without reported diagnosis 1976   1976  [facility]   . Cancer ([place]) 08/2008   left breast--has bilateral mastectomy  . Complication of  anesthesia    pt states" gets shakes" with general  . Cyst near tailbone   . Elevated LDL cholesterol level   . Environmental and seasonal allergies   . Fibroid   . Headache    sleeps , no meds  . Heart murmur    never has caused any problems  . Helicobacter pylori infection   . Hemorrhoids    hx of  . History of breast cancer   . History of shingles    03/07/18  . Hypotension    hx of  . Post-operative nausea and vomiting   . RA (rheumatoid arthritis) ([place])   . RA (rheumatoid arthritis) ([place])   . Scoliosis    tx with robaxin prn - rarely uses  . SVD (spontaneous vaginal delivery) 1996   x 1    Family History  Problem Relation Age of Onset  . Breast cancer Mother   . Diabetes Mother   . Hypertension Mother   . Stroke Mother   . Irritable bowel syndrome Mother   . Kidney disease Mother   . Heart failure Father   . Hypertension Father   . Colon cancer Neg Hx   . Colon polyps Neg Hx   .  Esophageal cancer Neg Hx   . Pancreatic cancer Neg Hx   . Rectal cancer Neg Hx   . Stomach cancer Neg Hx     Past Surgical History:  Procedure Laterality Date  . ABDOMINAL HYSTERECTOMY    . APPENDECTOMY    . BREAST LUMPECTOMY  05/17/2008   x 2 re excise for margin - Dr Margot Chimes, bilateral mastectomy 08/2008  . BREAST LUMPECTOMY W/ NEEDLE LOCALIZATION  04/23/2008   w SLN Dr Margot Chimes  . CESAREAN SECTION  01/01/2004   Dr Quincy Simmonds  . COLONOSCOPY  03/2018  . COLONOSCOPY  05/05/2019  . ENDOMETRIAL BIOPSY  06/2013, 2015   x 2  . EYE SURGERY  2003   bilateral lasik  . FOOT SURGERY    . HYSTEROSCOPY WITH D & C  07/21/2011   Procedure: DILATATION AND CURETTAGE (D&C) /HYSTEROSCOPY;  Surgeon: Arloa Koh;  Location: [facility];  Service: Gynecology;  Laterality: N/A;  with Ultrasound Guidance  . INSERTION OF TISSUE EXPANDER AFTER MASTECTOMY  09/11/2008   Dr Harlow Mares  . KNEE ARTHROSCOPY W/ MENISCAL REPAIR Right 2010   --Dr. Telford Nab  . LAPAROSCOPIC ASSISTED VAGINAL HYSTERECTOMY N/A 01/15/2015    Procedure: LAPAROSCOPIC ASSISTED VAGINAL HYSTERECTOMY;  Surgeon: Nunzio Cobbs, MD;  Location: [facility];  Service: Gynecology;  Laterality: N/A;  . left foot surgery  2004  . left rotator cuff repair  01/2006  . MASTECTOMY  09/11/2008   bilateral - Dr Margot Chimes  . PORT-A-CATH REMOVAL  09/09/2009   Dr Margot Chimes  . PORTACATH PLACEMENT  05/17/2008   Dr Cori Razor rounds of chemo  . REMOVAL OF TISSUE EXPANDER AND PLACEMENT OF IMPLANT  12/2008  . RHINOPLASTY    . SALPINGOOPHORECTOMY Bilateral 01/15/2015   Procedure: BILATERAL SALPINGO OOPHORECTOMY;  Surgeon: Nunzio Cobbs, MD;  Location: [facility];  Service: Gynecology;  Laterality: Bilateral;  . UMBILICAL HERNIA REPAIR  09/09/2009   Dr Margot Chimes  . UPPER GI ENDOSCOPY  03/2018  . WISDOM TOOTH EXTRACTION     Social History   Occupational History  . Not on file  Tobacco Use  . Smoking status: Never Smoker  . Smokeless tobacco: Never Used  Vaping Use  . Vaping Use: Never used  Substance and Sexual Activity  . Alcohol use: Yes    Alcohol/week: 2.0 standard drinks    Types: 2 Cans of beer per week    Comment: occ glass of wine or beer  . Drug use: No  . Sexual activity: Yes    Partners: Male    Birth control/protection: Post-menopausal    Comment: Hyst/BSO

## 2021-01-13 NOTE — Patient Instructions (Signed)
Rest the left elbow and hand, a sling for 1-2 weeks. EMG and NCV can be negative with radial tunnel syndrome and medial epicondylitis. I am referring you to Dr. Ninfa Linden for consideration of a left radial tunnel release. Xrays of the left elbow and forearm should be done at that visit.  Gabapentin 100mg   At night for nerve pain. Voltaren gel applied to the medial elbow and over the lateral ulna area 4 gm 3-4 times per day.

## 2021-01-13 NOTE — Telephone Encounter (Signed)
Waiting on EMG/NCS Report

## 2021-01-17 NOTE — Telephone Encounter (Signed)
Report in blue folder

## 2021-02-04 ENCOUNTER — Encounter: Payer: 59 | Admitting: Physical Medicine and Rehabilitation

## 2021-02-10 ENCOUNTER — Ambulatory Visit: Payer: 59 | Admitting: Orthopaedic Surgery

## 2021-02-10 ENCOUNTER — Ambulatory Visit: Payer: Self-pay

## 2021-02-10 ENCOUNTER — Encounter: Payer: Self-pay | Admitting: Orthopaedic Surgery

## 2021-02-10 DIAGNOSIS — M7702 Medial epicondylitis, left elbow: Secondary | ICD-10-CM

## 2021-02-10 DIAGNOSIS — M79602 Pain in left arm: Secondary | ICD-10-CM | POA: Diagnosis not present

## 2021-02-10 DIAGNOSIS — M25532 Pain in left wrist: Secondary | ICD-10-CM | POA: Diagnosis not present

## 2021-02-10 MED ORDER — LIDOCAINE HCL 1 % IJ SOLN
1.0000 mL | INTRAMUSCULAR | Status: AC | PRN
  Administered 2021-02-10: 1 mL

## 2021-02-10 MED ORDER — METHYLPREDNISOLONE ACETATE 40 MG/ML IJ SUSP
40.0000 mg | INTRAMUSCULAR | Status: AC | PRN
  Administered 2021-02-10: 40 mg

## 2021-02-10 NOTE — Progress Notes (Signed)
Office Visit Note   Patient: Rebecca Fleming           Date of Birth: Mar 29, 1966           MRN: [id number] Visit Date: 02/10/2021              Requested by: Nunzio Cobbs, MD [address] [place],  [place] [address] PCP: Nunzio Cobbs, MD   Assessment & Plan: Visit Diagnoses:  1. Left arm pain   2. Pain in left wrist   3. Medial epicondylitis of elbow, left     Plan: She does have Voltaren gel at home but is only use it on joints.  I would like her to try this on the ulnar aspect of her left wrist and over the left medial epicondyle.  I did offer her steroid injections in both areas and she agreed to this and tolerated steroid over the left ulnar styloid as well as the left medial epicondyle.  I would like to see her back in 4 weeks to see how she is doing overall.  All questions and concerns were answered and addressed.  She agreed to this treatment plan.  Follow-Up Instructions: Return in about 4 weeks (around 03/10/2021).   Orders:  Orders Placed This Encounter  Procedures  . Hand/UE Inj  . Hand/UE Inj  . XR Forearm Left   No orders of the defined types were placed in this encounter.     Procedures: Hand/UE Inj: L elbow for lateral epicondylitis on 02/10/2021 8:57 AM Medications: 1 mL lidocaine 1 %; 40 mg methylPREDNISolone acetate 40 MG/ML  Hand/UE Inj: L extensor compartment 6 for tendonitis on 02/10/2021 8:57 AM Details: dorsal approach Medications: 1 mL lidocaine 1 %; 40 mg methylPREDNISolone acetate 40 MG/ML      Clinical Data: No additional findings.   Subjective: Chief Complaint  Patient presents with  . Left Hand - Follow-up  . Left Elbow - Follow-up  The patient is a very pleasant and active 55 year old female sent by Dr. Louanne Skye to evaluate and treat left upper extremity pain.  She has a radicular component of his pain that causes numbness and tingling above the elbow and into the dorsum of the forearm and wrist.   She does have rheumatoid disease.  She is right-hand dominant but does use her left wrist for a lot of activities.  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  1 working diagnosis was the possibility of radial tunnel syndrome.  She is on Plaquenil for her RA.  She does get pain in the fingers and in multiple joints given her RA.  HPI  Review of Systems There is no headache, chest pain, shortness of breath, fever, chills, nausea, vomiting  Objective: Vital Signs: LMP 11/12/2014   Physical Exam She is alert and orient x3 and in no acute distress Ortho Exam Examination of her left upper extremity shows tenderness to palpation of the medial epicondyle but no Tinel's sign over the cubital tunnel or the radial tunnel.  There is no numbness in the superficial radial nerve distribution.  She does have pain over the ulnar styloid and the ulnar aspect of her wrist.  There is a lot of pain with pronation and supination of the wrist.  There is no effusion  of the wrist with the elbow joints themselves.  There is no muscle atrophy. Specialty Comments:  No specialty comments available.  Imaging: XR Forearm Left  Result Date: 02/10/2021 2 views of the left forearm showed no acute findings.  The wrist and elbow appear normal as do the carpal bones in the wrist.    PMFS History: Patient Active Problem List   Diagnosis Date Noted  . Primary osteoarthritis of both knees 09/01/2017  . Scoliosis 09/01/2017  . High risk medication use 11/11/2016  . Primary osteoarthritis of both hands 11/11/2016  . Lateral epicondylitis, right elbow 11/11/2016  . Myopia with astigmatism and presbyopia, bilateral 09/09/2016  . 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 Status post laparoscopic assisted vaginal hysterectomy (LAVH) 01/15/2015  . Breast cancer, left  breast ([place]) 11/07/2014  . Postmenopausal bleeding 07/13/2013  . Rheumatoid arthritis ([place]) 01/11/2012  . Psoriasis 01/11/2012   Past Medical History:  Diagnosis Date  . Abnormal uterine bleeding   . Allergy   . Anemia   . Arthritis   . Asthma    exercised induced asthma - rarely uses inhaler;none since Chemo.  . Blood transfusion without reported diagnosis 1976   1976  following surgery at [facility]   . Cancer ([place]) 08/2008   left breast--has bilateral mastectomy  . Complication of anesthesia    pt states" gets shakes" with general  . Cyst near tailbone   . Elevated LDL cholesterol level   . Environmental and seasonal allergies   . Fibroid   . Headache    sleeps , no meds  . Heart murmur    never has caused any problems  . Helicobacter pylori infection   . Hemorrhoids    hx of  . History of breast cancer   . History of shingles    03/07/18  . Hypotension    hx of  . Post-operative nausea and vomiting   . RA (rheumatoid arthritis) ([place])   . RA (rheumatoid arthritis) ([place])   . Scoliosis    tx with robaxin prn - rarely uses  . SVD (spontaneous vaginal delivery) 1996   x 1    Family History  Problem Relation Age of Onset  . Breast cancer Mother   . Diabetes Mother   . Hypertension Mother   . Stroke Mother   . Irritable bowel syndrome Mother   . Kidney disease Mother   . Heart failure Father   . Hypertension Father   . Colon cancer Neg Hx   . Colon polyps Neg Hx   . Esophageal cancer Neg Hx   . Pancreatic cancer Neg Hx   . Rectal cancer Neg Hx   . Stomach cancer Neg Hx     Past Surgical History:  Procedure Laterality Date  . ABDOMINAL HYSTERECTOMY    . APPENDECTOMY    . BREAST LUMPECTOMY  05/17/2008   x 2 re excise for margin - Dr Margot Chimes, bilateral mastectomy 08/2008  . BREAST LUMPECTOMY W/ NEEDLE LOCALIZATION  04/23/2008   w SLN Dr Margot Chimes  . CESAREAN SECTION  01/01/2004   Dr Quincy Simmonds  . COLONOSCOPY  03/2018  . COLONOSCOPY  05/05/2019  . ENDOMETRIAL BIOPSY   06/2013, 2015   x 2  . EYE SURGERY  2003   bilateral lasik  . FOOT SURGERY    . HYSTEROSCOPY WITH D & C  07/21/2011   Procedure: DILATATION AND CURETTAGE (D&C) /HYSTEROSCOPY;  Surgeon: Arloa Koh;  Location: [facility];  Service: Gynecology;  Laterality: N/A;  with Ultrasound Guidance  . INSERTION OF TISSUE EXPANDER AFTER MASTECTOMY  09/11/2008   Dr Harlow Mares  . KNEE ARTHROSCOPY W/ MENISCAL REPAIR Right 2010   --Dr. Telford Nab  . LAPAROSCOPIC ASSISTED VAGINAL HYSTERECTOMY N/A 01/15/2015   Procedure: LAPAROSCOPIC ASSISTED VAGINAL HYSTERECTOMY;  Surgeon: Nunzio Cobbs, MD;  Location: [facility];  Service: Gynecology;  Laterality: N/A;  . left foot surgery  2004  . left rotator cuff repair  01/2006  . MASTECTOMY  09/11/2008   bilateral - Dr Margot Chimes  . PORT-A-CATH REMOVAL  09/09/2009   Dr Margot Chimes  . PORTACATH PLACEMENT  05/17/2008   Dr Cori Razor rounds of chemo  . REMOVAL OF TISSUE EXPANDER AND PLACEMENT OF IMPLANT  12/2008  . RHINOPLASTY    . SALPINGOOPHORECTOMY Bilateral 01/15/2015   Procedure: BILATERAL SALPINGO OOPHORECTOMY;  Surgeon: Nunzio Cobbs, MD;  Location: [facility];  Service: Gynecology;  Laterality: Bilateral;  . UMBILICAL HERNIA REPAIR  09/09/2009   Dr Margot Chimes  . UPPER GI ENDOSCOPY  03/2018  . WISDOM TOOTH EXTRACTION     Social History   Occupational History  . Not on file  Tobacco Use  . Smoking status: Never Smoker  . Smokeless tobacco: Never Used  Vaping Use  . Vaping Use: Never used  Substance and Sexual Activity  . Alcohol use: Yes    Alcohol/week: 2.0 standard drinks    Types: 2 Cans of beer per week    Comment: occ glass of wine or beer  . Drug use: No  . Sexual activity: Yes    Partners: Male    Birth control/protection: Post-menopausal    Comment: Hyst/BSO

## 2021-02-11 NOTE — Progress Notes (Signed)
Office Visit Note  Patient: Rebecca Fleming             Date of Birth: Dec 01, 1965           MRN: [id number]             PCP: Nunzio Cobbs, MD Referring: Aundria Rud* Visit Date: 02/25/2021 Occupation: @GUAROCC @  Subjective:  Left elbow pain.   History of Present Illness: HOLLIS OH is a 55 y.o. female the history of rheumatoid arthritis, osteoarthritis and degenerative disc disease.  She states after the last visit she decided not to start on leflunomide.  She took prednisone taper.  She was also evaluated by Dr. Louanne Skye and then was referred to Dr. Ninfa Linden.  Patient states she had EMG which was negative.  She had left lateral epicondyle injection and left wrist joint extensor compartment injection on Feb 10, 2021 by Dr. Ninfa Linden.  She states that this helped her symptoms remarkably.  She states she has some stiffness coming back in her wrist.  She works on the computer for several hours a day.  None of the other joints are painful or swollen.  Activities of Daily Living:  Patient reports morning stiffness for 0 minutes.   Patient Denies nocturnal pain.  Difficulty dressing/grooming: Denies Difficulty climbing stairs: Denies Difficulty getting out of chair: Denies Difficulty using hands for taps, buttons, cutlery, and/or writing: Denies  Review of Systems  Constitutional: Negative for fatigue.  HENT: Negative for mouth sores, mouth dryness and nose dryness.   Eyes: Negative for pain, itching, visual disturbance and dryness.  Respiratory: Negative for cough, hemoptysis, shortness of breath and difficulty breathing.   Cardiovascular: Negative for chest pain, palpitations and swelling in legs/feet.  Gastrointestinal: Negative for abdominal pain, blood in stool, constipation and diarrhea.  Endocrine: Negative for increased urination.  Genitourinary: Negative for painful urination.  Musculoskeletal: Positive for arthralgias, joint pain and muscle weakness.  Negative for joint swelling, myalgias, morning stiffness, muscle tenderness and myalgias.  Skin: Negative for color change, rash and redness.  Allergic/Immunologic: Negative for susceptible to infections.  Neurological: Positive for headaches. Negative for dizziness, memory loss and weakness.  Hematological: Negative for swollen glands.  Psychiatric/Behavioral: Negative for confusion and sleep disturbance.    PMFS History:  Patient Active Problem List   Diagnosis Date Noted  . Primary osteoarthritis of both knees 09/01/2017  . Scoliosis 09/01/2017  . High risk medication use 11/11/2016  . Primary osteoarthritis of both hands 11/11/2016  . Lateral epicondylitis, right elbow 11/11/2016  . Myopia with astigmatism and presbyopia, bilateral 09/09/2016  . 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 Status post laparoscopic assisted vaginal hysterectomy (LAVH) 01/15/2015  . Breast cancer, left breast ([place]) 11/07/2014  . Postmenopausal bleeding 07/13/2013  . Rheumatoid arthritis ([place]) 01/11/2012  . Psoriasis 01/11/2012    Past Medical History:  Diagnosis Date  . Abnormal uterine bleeding   . Allergy   . Anemia   . Arthritis   . Asthma    exercised induced asthma - rarely uses inhaler;none since Chemo.  . Blood transfusion without reported diagnosis 1976   1976  [facility]   . Cancer ([place]) 08/2008   left breast--has bilateral mastectomy  . Complication of anesthesia    pt states" gets shakes" with general  . Cyst near tailbone   . Elevated LDL cholesterol level   . Environmental and seasonal allergies   . Fibroid   . Headache  sleeps , no meds  . Heart murmur    never has caused any problems  . Helicobacter pylori infection   . Hemorrhoids    hx of  . History of breast cancer   . History of shingles    03/07/18  . Hypotension    hx of  . Post-operative nausea and vomiting   . RA (rheumatoid arthritis) ([place])   . RA (rheumatoid arthritis)  ([place])   . Scoliosis    tx with robaxin prn - rarely uses  . SVD (spontaneous vaginal delivery) 1996   x 1    Family History  Problem Relation Age of Onset  . Breast cancer Mother   . Diabetes Mother   . Hypertension Mother   . Stroke Mother   . Irritable bowel syndrome Mother   . Kidney disease Mother   . Heart failure Father   . Hypertension Father   . Colon cancer Neg Hx   . Colon polyps Neg Hx   . Esophageal cancer Neg Hx   . Pancreatic cancer Neg Hx   . Rectal cancer Neg Hx   . Stomach cancer Neg Hx    Past Surgical History:  Procedure Laterality Date  . ABDOMINAL HYSTERECTOMY    . APPENDECTOMY    . BREAST LUMPECTOMY  05/17/2008   x 2 re excise for margin - Dr Margot Chimes, bilateral mastectomy 08/2008  . BREAST LUMPECTOMY W/ NEEDLE LOCALIZATION  04/23/2008   w [facility] Dr Margot Chimes  . CESAREAN SECTION  01/01/2004   Dr Quincy Simmonds  . COLONOSCOPY  03/2018  . COLONOSCOPY  05/05/2019  . ENDOMETRIAL BIOPSY  06/2013, 2015   x 2  . EYE SURGERY  2003   bilateral lasik  . FOOT SURGERY    . HYSTEROSCOPY WITH D & C  07/21/2011   Procedure: DILATATION AND CURETTAGE (D&C) /HYSTEROSCOPY;  Surgeon: Arloa Koh;  Location: [facility];  Service: Gynecology;  Laterality: N/A;  with Ultrasound Guidance  . INSERTION OF TISSUE EXPANDER AFTER MASTECTOMY  09/11/2008   Dr Harlow Mares  . KNEE ARTHROSCOPY W/ MENISCAL REPAIR Right 2010   --Dr. Telford Nab  . LAPAROSCOPIC ASSISTED VAGINAL HYSTERECTOMY N/A 01/15/2015   Procedure: LAPAROSCOPIC ASSISTED VAGINAL HYSTERECTOMY;  Surgeon: Nunzio Cobbs, MD;  Location: [facility];  Service: Gynecology;  Laterality: N/A;  . left foot surgery  2004  . left rotator cuff repair  01/2006  . MASTECTOMY  09/11/2008   bilateral - Dr Margot Chimes  . PORT-A-CATH REMOVAL  09/09/2009   Dr Margot Chimes  . PORTACATH PLACEMENT  05/17/2008   Dr Cori Razor rounds of chemo  . REMOVAL OF TISSUE EXPANDER AND PLACEMENT OF IMPLANT  12/2008  . RHINOPLASTY    . SALPINGOOPHORECTOMY Bilateral 01/15/2015    Procedure: BILATERAL SALPINGO OOPHORECTOMY;  Surgeon: Nunzio Cobbs, MD;  Location: [facility];  Service: Gynecology;  Laterality: Bilateral;  . UMBILICAL HERNIA REPAIR  09/09/2009   Dr Margot Chimes  . UPPER GI ENDOSCOPY  03/2018  . WISDOM TOOTH EXTRACTION     Social History   Social History Narrative  . Not on file   Immunization History  Administered Date(s) Administered  . Influenza,inj,Quad PF,6+ Mos 07/12/2019  . PFIZER(Purple Top)SARS-COV-2 Vaccination 10/05/2019, 10/25/2019, 05/11/2020  . Pneumococcal Polysaccharide-23 01/16/2015  . Tdap 09/21/2010, 07/12/2019     Objective: Vital Signs: BP 113/74 (BP Location: Right Arm, Patient Position: Sitting, Cuff Size: Normal)   Pulse 66   Ht 5\' 6"  (1.676 m)   Wt 169 lb 9.6 oz (  76.9 kg)   LMP 11/12/2014   BMI 27.37 kg/m    Physical Exam Vitals and nursing note reviewed.  Constitutional:      Appearance: She is well-developed.  HENT:     Head: Normocephalic and atraumatic.  Eyes:     Conjunctiva/sclera: Conjunctivae normal.  Cardiovascular:     Rate and Rhythm: Normal rate and regular rhythm.     Heart sounds: Normal heart sounds.  Pulmonary:     Effort: Pulmonary effort is normal.     Breath sounds: Normal breath sounds.  Abdominal:     General: Bowel sounds are normal.     Palpations: Abdomen is soft.  Musculoskeletal:     Cervical back: Normal range of motion.  Lymphadenopathy:     Cervical: No cervical adenopathy.  Skin:    General: Skin is warm and dry.     Capillary Refill: Capillary refill takes less than 2 seconds.  Neurological:     Mental Status: She is alert and oriented to person, place, and time.  Psychiatric:        Behavior: Behavior normal.      Musculoskeletal Exam: C-spine was in good range of motion.  Shoulder joints, elbow joints, wrist joints, MCPs PIPs and DIPs with good range of motion with no synovitis.  She has some tenderness on palpation of bilateral lateral epicondyle area especially  on the left side.  Hip joints, knee joints, ankles were in good range of motion.  She had no tenderness over ankles or MTPs.  CDAI Exam: CDAI Score: 0.4  Patient Global: 3 mm; Provider Global: 1 mm Swollen: 0 ; Tender: 0  Joint Exam 02/25/2021   No joint exam has been documented for this visit   There is currently no information documented on the homunculus. Go to the Rheumatology activity and complete the homunculus joint exam.  Investigation: No additional findings.  Imaging: XR Forearm Left  Result Date: 02/10/2021 2 views of the left forearm showed no acute findings.  The wrist and elbow appear normal as do the carpal bones in the wrist.   Recent Labs: Lab Results  Component Value Date   WBC 6.2 02/24/2021   HGB 14.0 02/24/2021   PLT 196 02/24/2021   NA 144 02/24/2021   K 4.5 02/24/2021   CL 107 02/24/2021   CO2 30 02/24/2021   GLUCOSE 87 02/24/2021   BUN 14 02/24/2021   CREATININE 0.74 02/24/2021   BILITOT 0.5 02/24/2021   ALKPHOS 66 03/31/2017   AST 13 02/24/2021   ALT 13 02/24/2021   PROT 6.1 02/24/2021   ALBUMIN 4.1 03/31/2017   CALCIUM 9.7 02/24/2021   GFRAA 105 02/24/2021   QFTBGOLDPLUS NEGATIVE 11/27/2020    Speciality Comments: PLQ Eye Exam: 09/30/2020  [facility] @ [place] [phone] . Follow up in 1 year..  Procedures:  No procedures performed Allergies: Latex, Other, and Percocet [oxycodone-acetaminophen]   Assessment / Plan:     Visit Diagnoses: Rheumatoid arthritis involving multiple sites with positive rheumatoid factor (HCC) - +RF, +CCP: Patient had no synovitis on examination.  She was noted to have synovitis at the last visit.  At the time she was given a prescription for leflunomide.  She states that she did not start leflunomide.  She took prednisone taper.  She had cortisone injection by Dr. Ninfa Linden in May which relieved most of her symptoms.  It is possible that once the steroid effect wears out that she  may have a flare  of her symptoms again.  Have advised her to contact us in case she develops any increased joint pain and swelling.  High risk medication use - PLQ 200 mg p.o. twice daily.  PLQ eye exam 09/30/20.  Patient did not start leflunomide after the last visit.  Primary osteoarthritis of both hands-she has some osteoarthritis in her hands.  Dupuytren's contracture of right hand-mild, and not symptomatic.  Lateral epicondylitis, left elbow-she had cortisone injection by Dr. Ninfa Linden on Feb 10, 2021 with relief in symptoms.  She works on the computer for several hours a day.  She has mild recurrence of epicondylitis symptoms.  She had tenderness on palpation of bilateral lateral epicondyle region more prominent on the left side.  I offered physical therapy referral which she declined.  Have given her a handout on exercises.  Use of topical Voltaren gel was also advised.  Primary osteoarthritis of both knees-currently not symptomatic.  Chondromalacia of left patella-She is off-and-on discomfort with climbing stairs.  DDD (degenerative disc disease), cervical-she had good range of motion without discomfort.  Psoriasis-she had no active psoriasis lesions today.  Osteopenia of multiple sites - 04/24/2019 The BMD measured at Femur Neck Right is 0.823 g/cm2 with a T-scoreof -1.5.  DEXA scan was discussed.  Use of calcium, vitamin D and resistive exercises were discussed.  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  History of breast cancer - CTx, Bilatral mastectomy 2009, no RTX  History of asthma-she has not had any episodes of asthma in many years.  Orders: No orders of the defined types were placed in this encounter.  No orders of the defined types were placed in this encounter.    Follow-Up Instructions: Return in about 5 months (around 07/28/2021) for Rheumatoid arthritis.   Bo Merino, MD  Note - This record has been created using Editor, commissioning.   Chart creation errors have been sought, but may not always  have been located. Such creation errors do not reflect on  the standard of medical care.

## 2021-02-19 ENCOUNTER — Ambulatory Visit: Payer: 59 | Admitting: Specialist

## 2021-02-24 ENCOUNTER — Other Ambulatory Visit: Payer: Self-pay | Admitting: *Deleted

## 2021-02-24 DIAGNOSIS — Z79899 Other long term (current) drug therapy: Secondary | ICD-10-CM

## 2021-02-25 ENCOUNTER — Encounter: Payer: Self-pay | Admitting: Rheumatology

## 2021-02-25 ENCOUNTER — Other Ambulatory Visit: Payer: Self-pay

## 2021-02-25 ENCOUNTER — Ambulatory Visit: Payer: 59 | Admitting: Rheumatology

## 2021-02-25 VITALS — BP 113/74 | HR 66 | Ht 66.0 in | Wt 169.6 lb

## 2021-02-25 DIAGNOSIS — M720 Palmar fascial fibromatosis [Dupuytren]: Secondary | ICD-10-CM

## 2021-02-25 DIAGNOSIS — M7712 Lateral epicondylitis, left elbow: Secondary | ICD-10-CM

## 2021-02-25 DIAGNOSIS — G5761 Lesion of plantar nerve, right lower limb: Secondary | ICD-10-CM

## 2021-02-25 DIAGNOSIS — M19041 Primary osteoarthritis, right hand: Secondary | ICD-10-CM | POA: Diagnosis not present

## 2021-02-25 DIAGNOSIS — L409 Psoriasis, unspecified: Secondary | ICD-10-CM

## 2021-02-25 DIAGNOSIS — M0579 Rheumatoid arthritis with rheumatoid factor of multiple sites without organ or systems involvement: Secondary | ICD-10-CM

## 2021-02-25 DIAGNOSIS — M5030 Other cervical disc degeneration, unspecified cervical region: Secondary | ICD-10-CM

## 2021-02-25 DIAGNOSIS — Z79899 Other long term (current) drug therapy: Secondary | ICD-10-CM

## 2021-02-25 DIAGNOSIS — Z8709 Personal history of other diseases of the respiratory system: Secondary | ICD-10-CM

## 2021-02-25 DIAGNOSIS — M2242 Chondromalacia patellae, left knee: Secondary | ICD-10-CM

## 2021-02-25 DIAGNOSIS — R011 Cardiac murmur, unspecified: Secondary | ICD-10-CM

## 2021-02-25 DIAGNOSIS — Z853 Personal history of malignant neoplasm of breast: Secondary | ICD-10-CM

## 2021-02-25 DIAGNOSIS — Z862 Personal history of diseases of the blood and blood-forming organs and certain disorders involving the immune mechanism: Secondary | ICD-10-CM

## 2021-02-25 DIAGNOSIS — M170 Bilateral primary osteoarthritis of knee: Secondary | ICD-10-CM

## 2021-02-25 DIAGNOSIS — M19042 Primary osteoarthritis, left hand: Secondary | ICD-10-CM

## 2021-02-25 DIAGNOSIS — M8589 Other specified disorders of bone density and structure, multiple sites: Secondary | ICD-10-CM

## 2021-02-25 DIAGNOSIS — M7711 Lateral epicondylitis, right elbow: Secondary | ICD-10-CM

## 2021-02-25 LAB — CBC WITH DIFFERENTIAL/PLATELET
Absolute Monocytes: 397 cells/uL (ref 200–950)
Basophils Absolute: 50 cells/uL (ref 0–200)
Basophils Relative: 0.8 %
Eosinophils Absolute: 248 cells/uL (ref 15–500)
Eosinophils Relative: 4 %
HCT: 41.3 % (ref 35.0–45.0)
Hemoglobin: 14 g/dL (ref 11.7–15.5)
Lymphs Abs: 1432 cells/uL (ref 850–3900)
MCH: 31.5 pg (ref 27.0–33.0)
MCHC: 33.9 g/dL (ref 32.0–36.0)
MCV: 92.8 fL (ref 80.0–100.0)
MPV: 11 fL (ref 7.5–12.5)
Monocytes Relative: 6.4 %
Neutro Abs: 4073 cells/uL (ref 1500–7800)
Neutrophils Relative %: 65.7 %
Platelets: 196 10*3/uL (ref 140–400)
RBC: 4.45 10*6/uL (ref 3.80–5.10)
RDW: 12.3 % (ref 11.0–15.0)
Total Lymphocyte: 23.1 %
WBC: 6.2 10*3/uL (ref 3.8–10.8)

## 2021-02-25 LAB — COMPLETE METABOLIC PANEL WITH GFR
AG Ratio: 1.8 (calc) (ref 1.0–2.5)
ALT: 13 U/L (ref 6–29)
AST: 13 U/L (ref 10–35)
Albumin: 3.9 g/dL (ref 3.6–5.1)
Alkaline phosphatase (APISO): 56 U/L (ref 37–153)
BUN: 14 mg/dL (ref 7–25)
CO2: 30 mmol/L (ref 20–32)
Calcium: 9.7 mg/dL (ref 8.6–10.4)
Chloride: 107 mmol/L (ref 98–110)
Creat: 0.74 mg/dL (ref 0.50–1.05)
GFR, Est African American: 105 mL/min/{1.73_m2} (ref >=60)
GFR, Est Non African American: 91 mL/min/{1.73_m2} (ref >=60)
Globulin: 2.2 g/dL (calc) (ref 1.9–3.7)
Glucose, Bld: 87 mg/dL (ref 65–99)
Potassium: 4.5 mmol/L (ref 3.5–5.3)
Sodium: 144 mmol/L (ref 135–146)
Total Bilirubin: 0.5 mg/dL (ref 0.2–1.2)
Total Protein: 6.1 g/dL (ref 6.1–8.1)

## 2021-02-25 NOTE — Patient Instructions (Addendum)
Elbow and Forearm Exercises Ask your health care provider which exercises are safe for you. Do exercises exactly as told by your health care provider and adjust them as directed. It is normal to feel mild stretching, pulling, tightness, or discomfort as you do these exercises. Stop right away if you feel sudden pain or your pain gets worse. Do not begin these exercises until told by your health care provider. Range-of-motion exercises These exercises warm up your muscles and joints and improve the movement and flexibility of your injured elbow and forearm. The exercises also help to relieve pain, numbness, and tingling. These exercises are done using the muscles in your injured elbow and forearm (active). Elbow flexion, active 1. Hold your left / right arm at your side, and bend your elbow (flexion) as far as you can using only your arm muscles. 2. Hold this position for __________ seconds. 3. Slowly return to the starting position. Repeat __________ times. Complete this exercise __________ times a day. Elbow extension, active 1. Hold your left / right arm at your side, and straighten your elbow (extension) as much as you can using only your arm muscles. 2. Hold this position for __________ seconds. 3. Slowly return to the starting position. Repeat __________ times. Complete this exercise __________ times a day. Active forearm rotation, supination This is an exercise in which you turn (rotate) your forearm palm up (supination). 1. Stand or sit with your elbows at your sides. 2. Bend your left / right elbow to a 90-degree angle (right angle). 3. Rotate your palm up until you feel a gentle stretch on the inside of your forearm. 4. Hold this position for __________ seconds. 5. Slowly return to the starting position. Repeat __________ times. Complete this exercise __________ times a day. Active forearm rotation, pronation This is an exercise in which you turn (rotate) your forearm palm down  (pronation). 1. Stand or sit with your elbows at your sides. 2. Bend your left / right elbow to a 90-degree angle (right angle). 3. Rotate your palm down until you feel a gentle stretch on the top of your forearm. 4. Hold this position for __________ seconds. 5. Slowly return to the starting position. Repeat __________ times. Complete this exercise __________ times a day. Stretching exercises These exercises warm up your muscles and joints and improve the movement and flexibility of your injured elbow and forearm. These exercises also help to relieve pain, numbness, and tingling. These exercises are done using your healthy elbow and forearm to help stretch the muscles in your injured elbow and forearm (active-assisted). Elbow flexion, active-assisted 1. Hold your left / right arm at your side, and bend your elbow (flexion) as much as you can using your left / right arm muscles. 2. Use your other hand to bend your left / right elbow farther. To do this, gently push up on your forearm until you feel a gentle stretch on the back of your elbow. 3. Hold this position for __________ seconds. 4. Slowly return to the starting position. Repeat __________ times. Complete this exercise __________ times a day.   Elbow extension, active-assisted 1. Hold your left / right arm at your side, and straighten your elbow (extension) as much as you can using your left / right arm muscles. 2. Use your other hand to straighten the left / right elbow farther. To do this, gently push down on your forearm until you feel a gentle stretch on the inside of your elbow. 3. Hold this position for __________  seconds. 4. Slowly return to the starting position. Repeat __________ times. Complete this exercise __________ times a day.   Active-assisted forearm rotation, supination This is an exercise in which you turn (rotate) your forearm palm up (supination). 1. Sit with your left / right elbow bent in a 90-degree angle (right  angle) with your forearm resting on a table. 2. Keeping your upper body and shoulder still, rotate your forearm so your palm faces upward. 3. Use your other hand to help rotate your forearm further until you feel a gentle to moderate stretch. 4. Hold this position for __________ seconds. 5. Slowly release the stretch and return to the starting position. Repeat __________ times. Complete this exercise __________ times a day. Active-assisted forearm rotation, pronation This is an exercise in which you turn (rotate) your forearm palm down (pronation). 1. Sit with your left / right elbow bent in a 90-degree angle (right angle) with your forearm resting on a table. 2. Keeping your upper body and shoulder still, rotate your forearm so your palm faces the tabletop. 3. Use your other hand to help rotate your forearm further until you feel a gentle to moderate stretch. 4. Hold this position for __________ seconds. 5. Slowly release the stretch and return to the starting position. Repeat __________ times. Complete this exercise __________ times a day.   Passive elbow flexion, supine 1. Lie on your back (supine position). 2. Extend your left / right arm up in the air, bracing it with your other hand. 3. Let your left / right hand slowly lower toward your shoulder (passive flexion), while your elbow stays pointed toward the ceiling. You should feel a gentle stretch along the back of your upper arm and elbow. 4. If instructed by your health care provider, you may increase the intensity of your stretch by adding a small wrist weight or hand weight. 5. Hold this position for __________ seconds. 6. Slowly return to the starting position. Repeat __________ times. Complete this exercise __________ times a day. Passive elbow extension, supine 1. Lie on your back (supine position). Make sure that you are in a comfortable position that lets you relax your arm muscles. 2. Place a folded towel under your left /  right upper arm so your elbow and shoulder are at the same height. Straighten your left / right arm so your elbow does not rest on the bed or towel. 3. Let the weight of your hand stretch your elbow (passive extension). Keep your arm and chest muscles relaxed. You should feel a stretch on the inside of your elbow. 4. If told by your health care provider, you may increase the intensity of your stretch by adding a small wrist weight or hand weight. 5. Hold this position for __________ seconds. 6. Slowly release the stretch. Repeat __________ times. Complete this exercise __________ times a day.   Strengthening exercises These exercises build strength and endurance in your elbow and forearm. Endurance is the ability to use your muscles for a long time, even after they get tired. Elbow flexion, isometric 1. Stand or sit up straight. 2. Bend your left / right elbow in a 90-degree angle (right angle), and keep your forearm at the height of your waist. Your thumb should be pointed toward the ceiling (neutral forearm). 3. Place your other hand on top of your left / right forearm. Gently push down while you resist with your left / right arm (isometric flexion). Push as hard as you can with both arms without causing  any pain or movement at your left / right elbow. 4. Hold this position for __________ seconds. 5. Slowly release the tension in both arms. Let your muscles relax completely before you repeat the exercise. Repeat __________ times. Complete this exercise __________ times a day.   Elbow extension, isometric 1. Stand or sit up straight. 2. Place your left / right arm so your palm faces your abdomen and is at the height of your waist. 3. Place your other hand on the underside of your left / right forearm. Gently push up while you resist with your left / right arm (isometric extension). Push as hard as you can with both arms without causing any pain or movement at your left / right elbow. 4. Hold this  position for __________ seconds. 5. Slowly release the tension in both arms. Let your muscles relax completely before you repeat the exercise. Repeat __________ times. Complete this exercise __________ times a day.   Elbow flexion with forearm palm up 1. Sit on a firm chair without armrests, or stand up. 2. Place your left / right arm at your side with your elbow straight and your palm facing forward. 3. Holding a __________weight or gripping a rubber exercise band or tubing, bend your elbow to bring your hand toward your shoulder (flexion). 4. Hold this position for __________ seconds. 5. Slowly return to the starting position. Repeat __________ times. Complete this exercise __________ times a day.   Elbow extension, active 1. Sit on a firm chair without armrests, or stand up. 2. Hold a rubber exercise band or tubing in both hands. 3. Keeping your upper arms at your sides, bring both hands up to your left / right shoulder. Keep your left / right hand just below your other hand. 4. Straighten your left / right elbow (extension) while keeping your other arm still. 5. Hold this position for __________ seconds. 6. Control the resistance of the band or tubing as you return to the starting position. Repeat __________ times. Complete this exercise __________ times a day.   Forearm rotation, supination 1. Sit with your left / right forearm supported on a table. Your elbow should be at waist height and bent at a 90-degree angle (right angle). 2. Gently grasp a lightweight hammer. 3. Rest your hand over the edge of the table with your palm facing down. 4. Without moving your left / right elbow, slowly rotate your forearm to turn your palm up toward the ceiling (supination). 5. Hold this position for __________ seconds. 6. Slowly return to the starting position. Repeat __________ times. Complete this exercise __________ times a day.   Forearm rotation, pronation 1. Sit with your left / right forearm  supported on a table. Keep your elbow below shoulder height. 2. Gently grasp a lightweight hammer. 3. Rest your hand over the edge of the table with your palm facing up. 4. Without moving your left / right elbow, slowly rotate your forearm to turn your palm down toward the floor (pronation). 5. Hold this position for __________seconds. 6. Slowly return to the starting position. Repeat __________ times. Complete this exercise __________ times a day.   This information is not intended to replace advice given to you by your health care provider. Make sure you discuss any questions you have with your health care provider. Document Revised: 12/29/2018 Document Reviewed: 09/28/2018 Elsevier Patient Education  2021 [place] We placed an order today for your standing lab work.   Please have your standing  labs drawn in November  If possible, please have your labs drawn 2 weeks prior to your appointment so that the provider can discuss your results at your appointment.  Please note that you may see your imaging and lab results in [place] before we have reviewed them. We may be awaiting multiple results to interpret others before contacting you. Please allow our office up to 72 hours to thoroughly review all of the results before contacting the office for clarification of your results.  We have open lab daily: Monday through Thursday from 1:30-4:30 PM and Friday from 1:30-4:00 PM at the office of Dr. Bo Merino, [facility].   Please be advised, all patients with office appointments requiring lab work will take precedent over walk-in lab work.  If possible, please come for your lab work on Monday and Friday afternoons, as you may experience shorter wait times. The office is located at [address] [place], [place], [place] [address] No appointment is necessary.   Labs are drawn by [facility]. Please bring your co-pay at the time of your lab draw.  You may  receive a bill from [facility] for your lab work.  If you wish to have your labs drawn at another location, please call the office 24 hours in advance to send orders.  If you have any questions regarding directions or hours of operation,  please call [phone].   As a reminder, please drink plenty of water prior to coming for your lab work. Thanks!   Vaccines You are taking a medication(s) that can suppress your immune system.  The following immunizations are recommended: . Flu annually . Covid-19  . Td/Tdap (tetanus, diphtheria, pertussis) every 10 years . Pneumonia (Prevnar 15 then Pneumovax 23 at least 1 year apart.  Alternatively, can take Prevnar 20 without needing additional dose) . Shingrix (after age 68): 2 doses from 4 weeks to 6 months apart  Please check with your PCP to make sure you are up to date.  Heart Disease Prevention   Your inflammatory disease increases your risk of heart disease which includes heart attack, stroke, atrial fibrillation (irregular heartbeats), high blood pressure, heart failure and atherosclerosis (plaque in the arteries).  It is important to reduce your risk by:   . Keep blood pressure, cholesterol, and blood sugar at healthy levels   . Smoking Cessation   . Maintain a healthy weight  o BMI 20-25   . Eat a healthy diet  o Plenty of fresh fruit, vegetables, and whole grains  o Limit saturated fats, foods high in sodium, and added sugars  o DASH and Mediterranean diet   . Increase physical activity  o Recommend moderate physically activity for 150 minutes per week/ 30 minutes a day for five days a week These can be broken up into three separate ten-minute sessions during the day.   . Reduce Stress  . Meditation, slow breathing exercises, yoga, coloring books  . Dental visits twice a year

## 2021-03-10 ENCOUNTER — Encounter: Payer: Self-pay | Admitting: Orthopaedic Surgery

## 2021-03-10 ENCOUNTER — Ambulatory Visit: Payer: 59 | Admitting: Orthopaedic Surgery

## 2021-03-10 DIAGNOSIS — M79602 Pain in left arm: Secondary | ICD-10-CM | POA: Diagnosis not present

## 2021-03-10 DIAGNOSIS — M7702 Medial epicondylitis, left elbow: Secondary | ICD-10-CM

## 2021-03-10 DIAGNOSIS — M25532 Pain in left wrist: Secondary | ICD-10-CM

## 2021-03-10 NOTE — Progress Notes (Signed)
The patient comes in today for follow-up after having her ulnar-sided wrist injection on the left wrist as well as an injection at the left elbow epicondyle area.  She says both injections were very helpful and she is doing better overall.  Her left wrist and elbow do move well.  There is still some mild tenderness over the medial epicondyle of the left elbow and just some slight tenderness of the left wrist but she is back to activities more so.  She does have RA and gets joints to flareup from that.  Her working diagnosis when she was sent to me was radial tunnel syndrome but based on her clinical exam I do not feel that she has radial tunnel syndrome.  My next step would be considering a repeat steroid injection if it starts bothering her enough or eventually a MRI of the left wrist.  All question concerns were answered addressed.  She will continue Voltaren gel and if things worsen she will call us for repeat injection.

## 2021-03-21 ENCOUNTER — Telehealth: Payer: Self-pay

## 2021-03-21 ENCOUNTER — Other Ambulatory Visit: Payer: Self-pay | Admitting: *Deleted

## 2021-03-21 MED ORDER — PREDNISONE 5 MG PO TABS: ORAL_TABLET | ORAL | 0 refills | Status: DC

## 2021-03-21 NOTE — Telephone Encounter (Signed)
Verified verbal prescription with Dr, Estanislado Pandy for prednisone taper starting at 20 mg and taper by 5 mg every 2 days

## 2021-03-21 NOTE — Telephone Encounter (Signed)
Patient advised prescription for prednisone taper starting at 20 mg and taper by 5 mg every 2 days will be sent to the pharmacy. Patient states she is not interesting in starting on leflunomide at this time. Patient states she would like to see if the prednisone helps.

## 2021-03-21 NOTE — Addendum Note (Signed)
Addended by: Carole Binning on: 03/21/2021 04:33 PM   Modules accepted: Orders

## 2021-03-21 NOTE — Telephone Encounter (Signed)
Patient called to confirm that her Prednisone prescription will be sent to [facility] [employer] [facility] [place]

## 2021-03-21 NOTE — Telephone Encounter (Signed)
Patient states she is having a flare in her right hand up to her elbow. Patient states it started about 2 weeks ago and has progressively got worse. Patient has been using ibuprofen and Voltaren gel with no relief. Patient states she is taking PLQ. Patient states she missed one dose. Patient is requesting a prescription for prednisone. Please advise.

## 2021-03-21 NOTE — Telephone Encounter (Signed)
Patient advised prescription has been sent to the pharmacy.  

## 2021-03-21 NOTE — Addendum Note (Signed)
Addended by: Carole Binning on: 03/21/2021 02:07 PM   Modules accepted: Orders

## 2021-03-21 NOTE — Telephone Encounter (Signed)
Okay to give prednisone taper starting at 20 mg and taper by 5 mg every 2 days.  Patient did not start leflunomide at the last visit.  If she is interesting in starting on leflunomide please a schedule an earlier appointment.

## 2021-04-03 ENCOUNTER — Ambulatory Visit: Payer: Self-pay

## 2021-04-03 ENCOUNTER — Encounter: Payer: Self-pay | Admitting: Physician Assistant

## 2021-04-03 ENCOUNTER — Ambulatory Visit: Payer: 59 | Admitting: Physician Assistant

## 2021-04-03 ENCOUNTER — Telehealth: Payer: Self-pay | Admitting: Rheumatology

## 2021-04-03 ENCOUNTER — Other Ambulatory Visit: Payer: Self-pay

## 2021-04-03 VITALS — BP 116/77 | HR 60 | Ht 66.5 in | Wt 168.6 lb

## 2021-04-03 DIAGNOSIS — M79671 Pain in right foot: Secondary | ICD-10-CM | POA: Diagnosis not present

## 2021-04-03 DIAGNOSIS — Z79899 Other long term (current) drug therapy: Secondary | ICD-10-CM | POA: Diagnosis not present

## 2021-04-03 DIAGNOSIS — M0579 Rheumatoid arthritis with rheumatoid factor of multiple sites without organ or systems involvement: Secondary | ICD-10-CM

## 2021-04-03 DIAGNOSIS — R011 Cardiac murmur, unspecified: Secondary | ICD-10-CM

## 2021-04-03 DIAGNOSIS — M170 Bilateral primary osteoarthritis of knee: Secondary | ICD-10-CM

## 2021-04-03 DIAGNOSIS — M79672 Pain in left foot: Secondary | ICD-10-CM | POA: Diagnosis not present

## 2021-04-03 DIAGNOSIS — M19041 Primary osteoarthritis, right hand: Secondary | ICD-10-CM

## 2021-04-03 DIAGNOSIS — Z853 Personal history of malignant neoplasm of breast: Secondary | ICD-10-CM

## 2021-04-03 DIAGNOSIS — M7712 Lateral epicondylitis, left elbow: Secondary | ICD-10-CM

## 2021-04-03 DIAGNOSIS — M19042 Primary osteoarthritis, left hand: Secondary | ICD-10-CM

## 2021-04-03 DIAGNOSIS — G5761 Lesion of plantar nerve, right lower limb: Secondary | ICD-10-CM

## 2021-04-03 DIAGNOSIS — Z8709 Personal history of other diseases of the respiratory system: Secondary | ICD-10-CM

## 2021-04-03 DIAGNOSIS — M720 Palmar fascial fibromatosis [Dupuytren]: Secondary | ICD-10-CM

## 2021-04-03 DIAGNOSIS — Z862 Personal history of diseases of the blood and blood-forming organs and certain disorders involving the immune mechanism: Secondary | ICD-10-CM

## 2021-04-03 DIAGNOSIS — M2242 Chondromalacia patellae, left knee: Secondary | ICD-10-CM

## 2021-04-03 DIAGNOSIS — M5030 Other cervical disc degeneration, unspecified cervical region: Secondary | ICD-10-CM

## 2021-04-03 DIAGNOSIS — M8589 Other specified disorders of bone density and structure, multiple sites: Secondary | ICD-10-CM

## 2021-04-03 DIAGNOSIS — L409 Psoriasis, unspecified: Secondary | ICD-10-CM

## 2021-04-03 DIAGNOSIS — M7711 Lateral epicondylitis, right elbow: Secondary | ICD-10-CM

## 2021-04-03 MED ORDER — PREDNISONE 5 MG PO TABS: ORAL_TABLET | ORAL | 0 refills | Status: DC

## 2021-04-03 MED ORDER — LEFLUNOMIDE 10 MG PO TABS: ORAL_TABLET | ORAL | 0 refills | Status: DC

## 2021-04-03 NOTE — Telephone Encounter (Signed)
Patient calling in reference to Prednisone. Patient finished dose pack last Friday. Patient states first 3 days helped a lot, but once she got down to last two days she could tell symptoms were going to come back. Patient having a lot of pain, and swelling with hands. Mainly right hand up to elbow. Please call to advise. Patient unable to answer phone between 9:30 - 10:30a.

## 2021-04-03 NOTE — Telephone Encounter (Signed)
Per Dr. Estanislado Pandy patient's appointment moved to 04/03/2021

## 2021-04-03 NOTE — Telephone Encounter (Signed)
Patient states she is unable to close right hand. Patient states she is also having pain and swelling in Left hand but not as bad. Patient states she is also having swelling in right elbow and pain and selling in the outside of right foot. Patient states she has been out of prednisone since 03/28/2021. Patient states symptoms increased once she got to Prednisone 10 mg daily. Patient states she is taking Advil with no relief. Patient states she had trouble dressing herself this morning. Patient has made an appointment for 04/09/2021 to discuss [place]. Patient would like to know what she should do in the meantime. Please advise.

## 2021-04-03 NOTE — Patient Instructions (Addendum)
Standing Labs We placed an order today for your standing lab work.   Please have your standing labs drawn in 2 weeks x2, 2 months, then every 3 months   If possible, please have your labs drawn 2 weeks prior to your appointment so that the provider can discuss your results at your appointment.  Please note that you may see your imaging and lab results in [place] before we have reviewed them. We may be awaiting multiple results to interpret others before contacting you. Please allow our office up to 72 hours to thoroughly review all of the results before contacting the office for clarification of your results.  We have open lab daily: Monday through Thursday from 1:30-4:30 PM and Friday from 1:30-4:00 PM at the office of Dr. Bo Merino, [facility].   Please be advised, all patients with office appointments requiring lab work will take precedent over walk-in lab work.  If possible, please come for your lab work on Monday and Friday afternoons, as you may experience shorter wait times. The office is located at [address], [place], [place], [place] [address] No appointment is necessary.   Labs are drawn by [facility]. Please bring your co-pay at the time of your lab draw.  You may receive a bill from [facility] for your lab work.  If you wish to have your labs drawn at another location, please call the office 24 hours in advance to send orders.  If you have any questions regarding directions or hours of operation,  please call [phone].   As a reminder, please drink plenty of water prior to coming for your lab work. Thanks!   Leflunomide tablets What is this medication? LEFLUNOMIDE (le FLOO na mide) is for rheumatoid arthritis. This medicine may be used for other purposes; ask your health care provider orpharmacist if you have questions. COMMON BRAND NAME(S): Arava What should I tell my care team before I take this medication? They need to know if you have any of  these conditions: diabetes have a fever or infection high blood pressure immune system problems kidney disease liver disease low blood cell counts, like low white cell, platelet, or red cell counts lung or breathing disease, like asthma recently received or scheduled to receive a vaccine receiving treatment for cancer skin conditions or sensitivity tingling of the fingers or toes, or other nerve disorder tuberculosis an unusual or allergic reaction to leflunomide, teriflunomide, other medicines, food, dyes, or preservatives pregnant or trying to get pregnant breast-feeding How should I use this medication? Take this medicine by mouth with a full glass of water. Follow the directions on the prescription label. Take your medicine at regular intervals. Do not take your medicine more often than directed. Do not stop taking except on yourdoctor's advice. Talk to your pediatrician regarding the use of this medicine in children.Special care may be needed. Overdosage: If you think you have taken too much of this medicine contact apoison control center or emergency room at once. NOTE: This medicine is only for you. Do not share this medicine with others. What if I miss a dose? If you miss a dose, take it as soon as you can. If it is almost time for yournext dose, take only that dose. Do not take double or extra doses. What may interact with this medication? Do not take this medicine with any of the following medications: teriflunomide This medicine may also interact with the following medications: alosetron birth control pills caffeine cefaclor certain medicines for  diabetes like nateglinide, repaglinide, rosiglitazone, pioglitazone certain medicines for high cholesterol like atorvastatin, pravastatin, rosuvastatin, simvastatin charcoal cholestyramine ciprofloxacin duloxetine furosemide ketoprofen live virus vaccines medicines that increase your risk for  infection methotrexate mitoxantrone paclitaxel penicillin theophylline tizanidine warfarin This list may not describe all possible interactions. Give your health care provider a list of all the medicines, herbs, non-prescription drugs, or dietary supplements you use. Also tell them if you smoke, drink alcohol, or use illegaldrugs. Some items may interact with your medicine. What should I watch for while using this medication? Visit your health care provider for regular checks on your progress. Tell your doctor or health care provider if your symptoms do not start to get better or if they get worse. You may need blood work done while you are taking thismedicine. This medicine may cause serious skin reactions. They can happen weeks to months after starting the medicine. Contact your health care provider right away if you notice fevers or flu-like symptoms with a rash. The rash may be red or purple and then turn into blisters or peeling of the skin. Or, you might notice a red rash with swelling of the face, lips or lymph nodes in your neck or underyour arms. This medicine may stay in your body for up to 2 years after your last dose. Tell your doctor about any unusual side effects or symptoms. A medicine can begiven to help lower your blood levels of this medicine more quickly. Women must use effective birth control with this medicine. There is a potential for serious side effects to an unborn child. Do not become pregnant while taking this medicine. Inform your doctor if you wish to become pregnant. This medicine remains in your blood after you stop taking it. You must continue using effective birth control until the blood levels have been checked and they are low enough. A medicine can be given to help lower your blood levels of this medicine more quickly. Immediately talk to your doctor if you think you may be pregnant. You may need a pregnancy test. Talk to your health care provider orpharmacist for  more information. You should not receive certain vaccines during your treatment and for a certain time after your treatment with this medication ends. Talk to your health careprovider for more information. What side effects may I notice from receiving this medication? Side effects that you should report to your doctor or health care professionalas soon as possible: allergic reactions like skin rash, itching or hives, swelling of the face, lips, or tongue breathing problems cough increased blood pressure low blood counts - this medicine may decrease the number of white blood cells and platelets. You may be at increased risk for infections and bleeding. pain, tingling, numbness in the hands or feet rash, fever, and swollen lymph nodes redness, blistering, peeing or loosening of the skin, including inside the mouth signs of decreased platelets or bleeding - bruising, pinpoint red spots on the skin, black, tarry stools, blood in urine signs of infection - fever or chills, cough, sore throat, pain or trouble passing urine signs and symptoms of liver injury like dark yellow or brown urine; general ill feeling or flu-like symptoms; light-colored stools; loss of appetite; nausea; right upper belly pain; unusually weak or tired; yellowing of the eyes or skin trouble passing urine or change in the amount of urine vomiting Side effects that usually do not require medical attention (report to yourdoctor or health care professional if they continue or are bothersome): diarrhea hair  thinning or loss headache nausea tiredness This list may not describe all possible side effects. Call your doctor for medical advice about side effects. You may report side effects to FDA at1-800-FDA-1088. Where should I keep my medication? Keep out of the reach of children. Store at room temperature between 15 and 30 degrees C (59 and 86 degrees F). Protect from moisture and light. Throw away any unused medicine after  theexpiration date. NOTE: This sheet is a summary. It may not cover all possible information. If you have questions about this medicine, talk to your doctor, pharmacist, orhealth care provider.  2022 Elsevier/Gold Standard (2018-12-09 15:06:48)

## 2021-04-03 NOTE — Progress Notes (Signed)
Office Visit Note  Patient: Rebecca Fleming             Date of Birth: Jun 15, 1966           MRN: [id number]             PCP: Nunzio Cobbs, MD Referring: Aundria Rud* Visit Date: 04/03/2021 Occupation: @GUAROCC @  Subjective:  Pain in multiple joints     History of Present Illness: Rebecca Fleming is a 55 y.o. female with history of rheumatoid arthritis and osteoarthritis.  Patient is taking Plaquenil 200 mg 1 tablet by mouth twice daily Monday through Friday.  She has not missed any doses of Plaquenil recently.  She never started on Arava due to the concern for potential side effects as well as wanting to see if her left elbow and left wrist pain improved after having cortisone injections on 02/10/2021.  She states that initially her pain and inflammation improved but starting about 2 weeks ago she has been having increased pain and swelling in both hands.  She has been having difficulty performing ADLs first thing in the morning due to the severity of her symptoms.  She has been taking ibuprofen as needed for symptomatic relief and added turmeric several weeks ago.  She has been using Voltaren gel as needed for pain relief.  Activities of Daily Living:  Patient reports morning stiffness for all day. Patient Reports nocturnal pain.  Difficulty dressing/grooming: Reports Difficulty climbing stairs: Denies Difficulty getting out of chair: Denies Difficulty using hands for taps, buttons, cutlery, and/or writing: Reports  Review of Systems  Constitutional:  Positive for fatigue.  HENT:  Negative for mouth sores, mouth dryness and nose dryness.   Eyes:  Positive for photophobia and redness. Negative for pain, itching and dryness.  Respiratory:  Negative for shortness of breath and difficulty breathing.   Cardiovascular:  Negative for chest pain and palpitations.  Gastrointestinal:  Negative for blood in stool, constipation and diarrhea.  Endocrine: Negative for  increased urination.  Genitourinary:  Negative for difficulty urinating.  Musculoskeletal:  Positive for joint pain, joint pain, joint swelling, myalgias, morning stiffness and myalgias.  Skin:  Negative for color change, rash and redness.  Allergic/Immunologic: Negative for susceptible to infections.  Neurological:  Positive for numbness and weakness. Negative for dizziness, headaches and memory loss.  Hematological:  Positive for bruising/bleeding tendency.  Psychiatric/Behavioral:  Negative for confusion.    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   Blood transfusion without reported diagnosis 1976   1976  following surgery at [facility]) 08/2008   left breast--has bilateral mastectomy   Complication of anesthesia    pt states" gets  shakes" with general   Cyst near tailbone    Elevated LDL cholesterol level    Environmental and seasonal allergies    Fibroid    Headache    sleeps , no meds   Heart murmur    never has caused any problems   Helicobacter pylori infection    Hemorrhoids    hx of   History of breast cancer    History of shingles    03/07/18   Hypotension    hx of   Post-operative nausea and  vomiting    RA (rheumatoid arthritis) (H[facility])    RA (rheumatoid arthritis) ([place])    Scoliosis    tx with robaxin prn - rarely uses   SVD (spontaneous vaginal delivery) 1996   x 1    Family History  Problem Relation Age of Onset   Breast cancer Mother    Diabetes Mother    Hypertension Mother    Stroke Mother    Irritable bowel syndrome Mother    Kidney disease Mother    Heart failure Father    Hypertension Father    Colon cancer Neg Hx    Colon polyps Neg Hx    Esophageal cancer Neg Hx    Pancreatic cancer Neg Hx    Rectal cancer Neg Hx    Stomach cancer Neg Hx    Past Surgical History:  Procedure Laterality Date   ABDOMINAL HYSTERECTOMY     APPENDECTOMY     BREAST LUMPECTOMY  05/17/2008   x 2 re excise for margin - Dr Margot Chimes, bilateral mastectomy 08/2008   BREAST LUMPECTOMY W/ NEEDLE LOCALIZATION  04/23/2008   w [facility] Dr Margot Chimes   CESAREAN SECTION  01/01/2004   Dr Quincy Simmonds   COLONOSCOPY  03/2018   COLONOSCOPY  05/05/2019   ENDOMETRIAL BIOPSY  06/2013, 2015   x 2   EYE SURGERY  2003   bilateral lasik   FOOT SURGERY     HYSTEROSCOPY WITH D & C  07/21/2011   Procedure: DILATATION AND CURETTAGE (D&C) /HYSTEROSCOPY;  Surgeon: Arloa Koh;  Location: [facility];  Service: Gynecology;  Laterality: N/A;  with Ultrasound Guidance   INSERTION OF TISSUE EXPANDER AFTER MASTECTOMY  09/11/2008   Dr Harlow Mares   KNEE ARTHROSCOPY W/ MENISCAL REPAIR Right 2010   --Dr. Telford Nab   LAPAROSCOPIC ASSISTED VAGINAL HYSTERECTOMY N/A 01/15/2015   Procedure: LAPAROSCOPIC ASSISTED VAGINAL HYSTERECTOMY;  Surgeon: Nunzio Cobbs, MD;  Location: [facility];  Service: Gynecology;  Laterality: N/A;   left foot surgery  2004   left rotator cuff repair  01/2006   MASTECTOMY  09/11/2008   bilateral - Dr Margot Chimes   [facility] REMOVAL  09/09/2009   Dr Margot Chimes   [facility] PLACEMENT  05/17/2008   Dr Cori Razor rounds of chemo   REMOVAL OF TISSUE EXPANDER AND PLACEMENT OF IMPLANT  12/2008   RHINOPLASTY      SALPINGOOPHORECTOMY Bilateral 01/15/2015   Procedure: BILATERAL SALPINGO OOPHORECTOMY;  Surgeon: Nunzio Cobbs, MD;  Location: [facility];  Service: Gynecology;  Laterality: Bilateral;   UMBILICAL HERNIA REPAIR  09/09/2009   Dr Margot Chimes   UPPER GI ENDOSCOPY  03/2018   WISDOM TOOTH EXTRACTION     Social History   Social History Narrative   Not on file   Immunization History  Administered Date(s) Administered   Influenza,inj,Quad PF,6+ Mos 07/12/2019   PFIZER(Purple Top)SARS-COV-2 Vaccination 10/05/2019, 10/25/2019, 05/11/2020   Pneumococcal Polysaccharide-23 01/16/2015   Tdap 09/21/2010, 07/12/2019  Objective: Vital Signs: BP 116/77 (BP Location: Left Arm, Patient Position: Sitting, Cuff Size: Normal)   Pulse 60   Ht 5' 6.5" (1.689 m)   Wt 168 lb 9.6 oz (76.5 kg)   LMP 11/12/2014   BMI 26.81 kg/m    Physical Exam Vitals and nursing note reviewed.  Constitutional:      Appearance: She is well-developed.  HENT:     Head: Normocephalic and atraumatic.  Eyes:     Conjunctiva/sclera: Conjunctivae normal.  Pulmonary:     Effort: Pulmonary effort is normal.  Abdominal:     Palpations: Abdomen is soft.  Musculoskeletal:     Cervical back: Normal range of motion.  Skin:    General: Skin is warm and dry.     Capillary Refill: Capillary refill takes less than 2 seconds.  Neurological:     Mental Status: She is alert and oriented to person, place, and time.  Psychiatric:        Behavior: Behavior normal.     Musculoskeletal Exam: C-spine, thoracic spine, lumbar spine have good range of motion with no discomfort.  Shoulder joints have good range of motion with no discomfort.  Tenderness palpation over the right elbow joint line.  Tenderness over both wrist joints noted.  Tenderness and synovitis of the right second, third, and fourth MCP joints.  Tenderness over the left second and third MCPs and the left third PIP joint.  Hip joints have good range of motion with no  discomfort.  Knee joints have good range of motion with no warmth or effusion.  Ankle joints have good range of motion with no tenderness or joint swelling.  She has tenderness and inflammation at the base of the right fifth metatarsal.  CDAI Exam: CDAI Score: 16.2  Patient Global: 6 mm; Provider Global: 6 mm Swollen: 6 ; Tender: 9  Joint Exam 04/03/2021      Right  Left  Elbow   Tender     Wrist   Tender   Tender  MCP 2  Swollen Tender  Swollen Tender  MCP 3  Swollen Tender  Swollen Tender  MCP 4  Swollen Tender     PIP 3     Swollen Tender     Investigation: No additional findings.  Imaging: No results found.  Recent Labs: Lab Results  Component Value Date   WBC 6.2 02/24/2021   HGB 14.0 02/24/2021   PLT 196 02/24/2021   NA 144 02/24/2021   K 4.5 02/24/2021   CL 107 02/24/2021   CO2 30 02/24/2021   GLUCOSE 87 02/24/2021   BUN 14 02/24/2021   CREATININE 0.74 02/24/2021   BILITOT 0.5 02/24/2021   ALKPHOS 66 03/31/2017   AST 13 02/24/2021   ALT 13 02/24/2021   PROT 6.1 02/24/2021   ALBUMIN 4.1 03/31/2017   CALCIUM 9.7 02/24/2021   GFRAA 105 02/24/2021   QFTBGOLDPLUS NEGATIVE 11/27/2020    Speciality Comments: PLQ Eye Exam: 09/30/2020  WNL @ [facility] [phone] . Follow up in 1 year..  Procedures:  No procedures performed Allergies: Latex, Other, and Percocet [oxycodone-acetaminophen]  Assessment / Plan:     Visit Diagnoses: Rheumatoid arthritis involving multiple sites with positive rheumatoid factor ([place]): +RF, +CCP: She presents today with joint tenderness and synovitis in multiple joints as described above.  For the past 2 weeks she has been having increased pain and swelling in both hands, which has made it difficult to perform ADLs first thing  in the morning.  She has been taking ibuprofen as well as using Voltaren gel topically as needed for symptomatic relief.  She continues to take Plaquenil 200 mg 1 tablet by  mouth twice daily Monday through Friday and has not missed any doses recently.  At the office visit on 11/27/2020 she was flaring in her left wrist and left elbow so we discussed adding on Arava as combination therapy.  Consent was obtained at that visit but she never started on the new prescription.  She had a left elbow joint and a left extensor compartment 6 cortisone injection on 02/10/2021 performed by Dr. Ninfa Linden which alleviated her symptoms.  Over the past several weeks her symptoms have progressively been worsening despite taking a prednisone taper starting on 03/21/2021. X-rays of both hands and feet were updated today to assess for radiographic progression.  Different treatment options were discussed today in detail.  She is open to trying [place] in combination with Plaquenil.  Indications, contraindications, potential side effects of Arava were discussed today in detail and all questions were addressed.  Consent was previously obtained on 11/27/2020.  A new prescription for Jolee Ewing will be sent to the pharmacy today.  She will start on Arava 10 mg daily for 2 weeks and if labs are stable she will increase to 20 mg daily. A prednisone taper starting at 20 mg tapering by 5 mg every 4 days was sent to the pharmacy today.  She will follow-up in the office in 6 to 8 weeks to assess her response to combination therapy.   Medication counseling:  Baseline Immunosuppressant Therapy Labs Quantiferon TB Gold Latest Ref Rng & Units 11/27/2020  Quantiferon TB Gold Plus NEGATIVE NEGATIVE    Hepatitis Latest Ref Rng & Units 11/27/2020  Hep B Surface Ag NON-REACTI NON-REACTIVE  Hep B IgM NON-REACTI NON-REACTIVE  Hep C Ab NEGATIVE -    Lab Results  Component Value Date   HIV NON-REACTIVE 11/27/2020    Immunoglobulin Electrophoresis Latest Ref Rng & Units 11/27/2020  IgA  47 - 310 mg/dL 420(H)  IgG 600 - 1,640 mg/dL 675  IgM 50 - 300 mg/dL 80    Serum Protein Electrophoresis Latest Ref Rng & Units 02/24/2021   Total Protein 6.1 - 8.1 g/dL 6.1   Patient was counseled on the purpose, proper use, and adverse effects of leflunomide including risk of infection, nausea/diarrhea/weight loss, increase in blood pressure, rash, hair loss, tingling in the hands and feet, and signs and symptoms of interstitial lung disease.   Also counseled on Black Box warning of liver injury and importance of avoiding alcohol while on therapy. Discussed that there is the possibility of an increased risk of malignancy but it is not well understood if this increased risk is due to the medication or the disease state.  Counseled patient to avoid live vaccines. Recommend annual influenza, Pneumovax 23, Prevnar 13, and Shingrix as indicated.   Discussed the importance of frequent monitoring of liver function and blood count.  Standing orders placed.  Discussed importance of birth control while on leflunomide due to risk of congenital abnormalities, and patient confirms she had a hysterectomy. Provided patient with educational materials on leflunomide and answered all questions.  Patient consented to [place] use, and consent will be uploaded into the media tab.   Patient dose will be 10 mg daily x2 weeks and if labs are stable increase to 20 mg daily. Prescription pending lab results and/or insurance approval.  High risk medication use: She will  be starting on Arava 10 mg 1 tablet by mouth daily for 2 weeks and if labs are stable at that time she will increase to 20 mg daily.  She will remain on Plaquenil as prescribed.  A prednisone taper was sent to the pharmacy today.  She is aware of the risks of long-term prednisone use.  CBC and CMP were within normal limits on 02/24/2021.  She will return for lab work in 2 weeks x 2, 2 months, then every 3 months to monitor for drug toxicity.  Standing orders for CBC and CMP remain in place. She has had all baseline immunosuppressive lab work on 11/27/2020 and results were reviewed again today in the  office. Discussed the importance of holding [place] if she develops signs or symptoms of an infection and to resume once the infection has completely cleared.  Primary osteoarthritis of both hands: PIP thickening and prominence noted.  Subluxation of the DIP of the right fifth digit noted.  We discussed the importance of joint protection and muscle strengthening.  X-rays of both hands were updated today.  No erosive changes were noted. She has added turmeric to her current regimen as a natural anti-inflammatory.  Dupuytren's contracture of right hand: Unchanged.  Lateral epicondylitis, left elbow: Resolved.  Lateral epicondylitis, right elbow: She has tenderness palpation on examination today.  Primary osteoarthritis of both knees: She has good range of motion of both knee joints with no discomfort on examination today.  No warmth or effusion was noted.  Chondromalacia of left patella  DDD (degenerative disc disease), cervical: She has good range of motion with no discomfort.  Psoriasis: She has no active psoriasis at this time.  Osteopenia of multiple sites: DEXA updated on 04/24/2019: The BMD measured at Femur Neck Right is 0.823 g/cm2 with a T-score of -1.5.  DEXA ordered by her PCP at the [facility].  She is due to update DEXA in August 2022.  She will continue to take a calcium and vitamin D supplement as recommended.  Other medical conditions are listed as follows:  History of breast cancer: 2009  History of asthma  Morton's neuroma of right foot  Heart murmur  History of anemia  Orders: Orders Placed This Encounter  Procedures   XR Hand 2 View Left   XR Hand 2 View Right   XR Foot 2 Views Right   XR Foot 2 Views Left    Follow-Up Instructions: Return for Rheumatoid arthritis.   Ofilia Neas, PA-C  Note - This record has been created using Dragon software.  Chart creation errors have been sought, but may not always  have been located.  Such creation errors do not reflect on  the standard of medical care.

## 2021-04-04 NOTE — Progress Notes (Signed)
Please call the patient with x-ray results.  X-rays of both hands were consistent with osteoarthritic and rheumatoid arthritis findings.  No progression since 2019.  X-rays of both feet consistent with osteoarthritis.  No erosive changes noted.

## 2021-04-09 ENCOUNTER — Ambulatory Visit: Payer: 59 | Admitting: Physician Assistant

## 2021-04-15 ENCOUNTER — Ambulatory Visit: Payer: 59 | Admitting: Obstetrics and Gynecology

## 2021-04-17 ENCOUNTER — Other Ambulatory Visit: Payer: Self-pay

## 2021-04-17 DIAGNOSIS — Z79899 Other long term (current) drug therapy: Secondary | ICD-10-CM

## 2021-04-18 ENCOUNTER — Telehealth: Payer: Self-pay | Admitting: Rheumatology

## 2021-04-18 LAB — CBC WITH DIFFERENTIAL/PLATELET
Absolute Monocytes: 436 cells/uL (ref 200–950)
Basophils Absolute: 40 cells/uL (ref 0–200)
Basophils Relative: 0.4 %
Eosinophils Absolute: 218 cells/uL (ref 15–500)
Eosinophils Relative: 2.2 %
HCT: 43.2 % (ref 35.0–45.0)
Hemoglobin: 14.4 g/dL (ref 11.7–15.5)
Lymphs Abs: 1010 cells/uL (ref 850–3900)
MCH: 31.5 pg (ref 27.0–33.0)
MCHC: 33.3 g/dL (ref 32.0–36.0)
MCV: 94.5 fL (ref 80.0–100.0)
MPV: 11.3 fL (ref 7.5–12.5)
Monocytes Relative: 4.4 %
Neutro Abs: 8197 cells/uL — ABNORMAL HIGH (ref 1500–7800)
Neutrophils Relative %: 82.8 %
Platelets: 198 10*3/uL (ref 140–400)
RBC: 4.57 10*6/uL (ref 3.80–5.10)
RDW: 12.7 % (ref 11.0–15.0)
Total Lymphocyte: 10.2 %
WBC: 9.9 10*3/uL (ref 3.8–10.8)

## 2021-04-18 LAB — COMPLETE METABOLIC PANEL WITH GFR
AG Ratio: 1.7 (calc) (ref 1.0–2.5)
ALT: 21 U/L (ref 6–29)
AST: 20 U/L (ref 10–35)
Albumin: 4.1 g/dL (ref 3.6–5.1)
Alkaline phosphatase (APISO): 51 U/L (ref 37–153)
BUN: 17 mg/dL (ref 7–25)
CO2: 28 mmol/L (ref 20–32)
Calcium: 10 mg/dL (ref 8.6–10.4)
Chloride: 103 mmol/L (ref 98–110)
Creat: 0.91 mg/dL (ref 0.50–1.03)
Globulin: 2.4 g/dL (calc) (ref 1.9–3.7)
Glucose, Bld: 104 mg/dL — ABNORMAL HIGH (ref 65–99)
Potassium: 4.6 mmol/L (ref 3.5–5.3)
Sodium: 140 mmol/L (ref 135–146)
Total Bilirubin: 0.7 mg/dL (ref 0.2–1.2)
Total Protein: 6.5 g/dL (ref 6.1–8.1)
eGFR: 74 mL/min/{1.73_m2} (ref >=60)

## 2021-04-18 NOTE — Telephone Encounter (Signed)
Please advise. Arava 20 mg daily, PLQ twice daily.

## 2021-04-18 NOTE — Telephone Encounter (Signed)
Patient tested positive for COVID today. She took two tests after she spoke with you in reference to high lab counts. Patient needs to know what to do in reference to medication. Patient is not really having any symptoms. Please call to advise.

## 2021-04-21 NOTE — Telephone Encounter (Signed)
I called patient, patient has had negative and positive home tests, patient has stopped [place], patient does not have a PCP, patient verbalized understanding, Please advise the patient to hold [place] for 1 week after her symptoms have completely resolved. Ok to continue PLQ. She should contact her PCP if she develops symptoms.

## 2021-04-21 NOTE — Telephone Encounter (Signed)
Please advise the patient to hold [place] for 1 week after her symptoms have completely resolved. Ok to continue PLQ. She should contact her PCP if she develops symptoms.

## 2021-04-23 ENCOUNTER — Telehealth: Payer: Self-pay

## 2021-04-23 NOTE — Telephone Encounter (Signed)
I had a detailed discussion with the patient.  She states that she had two rapid test positive for COVID at home.  Although her PCR test was negative.  She continues to be asymptomatic.  I advised her to wait for 2 weeks and then start leflunomide.

## 2021-04-23 NOTE — Telephone Encounter (Signed)
Patient states she received the results of her PCR test last night which was negative.  Patient states she took 2 more rapid tests last night which were also negative.  Patient states she never experienced any Covid symptoms.   Patient requested a return call to let her know if she can restart her [place].

## 2021-04-25 ENCOUNTER — Other Ambulatory Visit: Payer: Self-pay | Admitting: Physician Assistant

## 2021-04-25 NOTE — Telephone Encounter (Signed)
Patient currently has [place] on hold due to infection.

## 2021-04-29 ENCOUNTER — Other Ambulatory Visit: Payer: Self-pay | Admitting: Physician Assistant

## 2021-04-29 DIAGNOSIS — M0579 Rheumatoid arthritis with rheumatoid factor of multiple sites without organ or systems involvement: Secondary | ICD-10-CM

## 2021-04-29 NOTE — Telephone Encounter (Signed)
Last Visit: 04/03/2021  Next Visit: 05/28/2021  Labs: 04/17/2021 Glucose is 104. Rest of CMP WNL.  Absolute neutrophil count is slightlyelevated.  WBC count is WNL.   Rest of CBC with diff is WNL.   Eye exam: 09/30/2020  WNL    Current Dose per office note 04/03/2021: Plaquenil 200 mg 1 tablet by mouth twice daily Monday through Friday  [id number] arthritis involving multiple sites with positive rheumatoid factor  Last Fill: 12/24/2020  Okay to refill Plaquenil?

## 2021-05-14 NOTE — Progress Notes (Signed)
Office Visit Note  Patient: Rebecca Fleming             Date of Birth: December 27, 1965           MRN: [id number]             PCP: Nunzio Cobbs, MD Referring: Aundria Rud* Visit Date: 05/28/2021 Occupation: '@GUAROCC'$ @  Subjective:  Medication monitoring  History of Present Illness: [name] is a 55 y.o. female with history of seropositive rheumatoid arthritis and osteoarthritis.  Patient is currently taking Arava 20 mg 1 tablet by mouth daily and Plaquenil 200 mg 1 tablet by mouth twice daily Monday through Friday.  She was started on [place] after her last office visit on 04/03/2021.  Patient reports that she had a gap in therapy due to testing positive for COVID-19.  She has been back on Arava 20 mg daily for the past 2 weeks.  She states that since starting on [place] she has been having 2-3 bowel movements per day and is also noticed increased hair thinning.  She has not noticed any improvement since starting on Arava.  She continues to have recurrent flares in the right hand.  She is currently having pain and swelling in the right third MCP joint.  She has intermittent pain in the right elbow. She denies any other joint pain or joint swelling currently. She is no longer taking prednisone.     Activities of Daily Living:  Patient reports morning stiffness for all day. Patient Denies nocturnal pain.  Difficulty dressing/grooming: Reports Difficulty climbing stairs: Denies Difficulty getting out of chair: Denies Difficulty using hands for taps, buttons, cutlery, and/or writing: Reports  Review of Systems  Constitutional:  Negative for fatigue.  HENT:  Negative for mouth sores, mouth dryness and nose dryness.   Eyes:  Positive for visual disturbance. Negative for pain, itching and dryness.  Respiratory:  Negative for shortness of breath and difficulty breathing.   Cardiovascular:  Negative for chest pain and palpitations.  Gastrointestinal:  Negative for blood in  stool, constipation and diarrhea.  Endocrine: Negative for increased urination.  Genitourinary:  Negative for difficulty urinating.  Musculoskeletal:  Positive for joint pain, joint pain, joint swelling and morning stiffness. Negative for myalgias, muscle tenderness and myalgias.  Skin:  Negative for color change and rash.  Allergic/Immunologic: Negative for susceptible to infections.  Neurological:  Negative for dizziness, numbness, headaches, memory loss and weakness.  Hematological:  Positive for bruising/bleeding tendency.  Psychiatric/Behavioral:  Negative for confusion.    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   Blood transfusion without reported diagnosis 1976   1976  following surgery at [facility]) 08/2008  left breast--has bilateral mastectomy   Complication of anesthesia    pt states" gets shakes" with general   Cyst near tailbone    Elevated LDL cholesterol level    Environmental and seasonal allergies    Fibroid    Headache    sleeps , no meds   Heart murmur    never has caused any problems   Helicobacter pylori infection    Hemorrhoids    hx of   History of breast cancer    History of shingles     03/07/18   Hypotension    hx of   Post-operative nausea and vomiting    RA (rheumatoid arthritis) (HCC)    RA (rheumatoid arthritis) ([place])    Scoliosis    tx with robaxin prn - rarely uses   SVD (spontaneous vaginal delivery) 1996   x 1    Family History  Problem Relation Age of Onset   Breast cancer Mother    Diabetes Mother    Hypertension Mother    Stroke Mother    Irritable bowel syndrome Mother    Kidney disease Mother    Heart failure Father    Hypertension Father    Colon cancer Neg Hx    Colon polyps Neg Hx    Esophageal cancer Neg Hx    Pancreatic cancer Neg Hx    Rectal cancer Neg Hx    Stomach cancer Neg Hx    Past Surgical History:  Procedure Laterality Date   ABDOMINAL HYSTERECTOMY     APPENDECTOMY     BREAST LUMPECTOMY  05/17/2008   x 2 re excise for margin - Dr Margot Chimes, bilateral mastectomy 08/2008   BREAST LUMPECTOMY W/ NEEDLE LOCALIZATION  04/23/2008   w [facility] Dr Margot Chimes   CESAREAN SECTION  01/01/2004   Dr Quincy Simmonds   COLONOSCOPY  03/2018   COLONOSCOPY  05/05/2019   ENDOMETRIAL BIOPSY  06/2013, 2015   x 2   EYE SURGERY  2003   bilateral lasik   FOOT SURGERY     HYSTEROSCOPY WITH D & C  07/21/2011   Procedure: DILATATION AND CURETTAGE (D&C) /HYSTEROSCOPY;  Surgeon: Arloa Koh;  Location: [facility];  Service: Gynecology;  Laterality: N/A;  with Ultrasound Guidance   INSERTION OF TISSUE EXPANDER AFTER MASTECTOMY  09/11/2008   Dr Harlow Mares   KNEE ARTHROSCOPY W/ MENISCAL REPAIR Right 2010   --Dr. Telford Nab   LAPAROSCOPIC ASSISTED VAGINAL HYSTERECTOMY N/A 01/15/2015   Procedure: LAPAROSCOPIC ASSISTED VAGINAL HYSTERECTOMY;  Surgeon: Nunzio Cobbs, MD;  Location: [facility];  Service: Gynecology;  Laterality: N/A;   left foot surgery  2004   left rotator cuff repair  01/2006   MASTECTOMY  09/11/2008   bilateral - Dr Margot Chimes   [facility] REMOVAL  09/09/2009   Dr Margot Chimes   [facility] PLACEMENT  05/17/2008   Dr Cori Razor rounds of chemo   REMOVAL OF TISSUE  EXPANDER AND PLACEMENT OF IMPLANT  12/2008   RHINOPLASTY     SALPINGOOPHORECTOMY Bilateral 01/15/2015   Procedure: BILATERAL SALPINGO OOPHORECTOMY;  Surgeon: Nunzio Cobbs, MD;  Location: [facility];  Service: Gynecology;  Laterality: Bilateral;   UMBILICAL HERNIA REPAIR  09/09/2009   Dr Margot Chimes   UPPER GI ENDOSCOPY  03/2018   WISDOM TOOTH EXTRACTION     Social History   Social History Narrative   Not on file   Immunization History  Administered Date(s) Administered   Influenza,inj,Quad PF,6+ Mos 07/12/2019   PFIZER(Purple Top)SARS-COV-2 Vaccination 10/05/2019, 10/25/2019,  05/11/2020   Pneumococcal Polysaccharide-23 01/16/2015   Tdap 09/21/2010, 07/12/2019     Objective: Vital Signs: BP 116/73 (BP Location: Right Arm, Patient Position: Sitting, Cuff Size: Normal)   Pulse 64   Ht '5\' 6"'$  (1.676 m)   Wt 165 lb 6.4 oz (75 kg)   LMP 11/12/2014   BMI 26.70 kg/m    Physical Exam Vitals and nursing note reviewed.  Constitutional:      Appearance: She is well-developed.  HENT:     Head: Normocephalic and atraumatic.  Eyes:     Conjunctiva/sclera: Conjunctivae normal.  Pulmonary:     Effort: Pulmonary effort is normal.  Abdominal:     Palpations: Abdomen is soft.  Musculoskeletal:     Cervical back: Normal range of motion.  Skin:    General: Skin is warm and dry.     Capillary Refill: Capillary refill takes less than 2 seconds.  Neurological:     Mental Status: She is alert and oriented to person, place, and time.  Psychiatric:        Behavior: Behavior normal.     Musculoskeletal Exam: C-spine, thoracic spine, lumbar spine good range of motion.  Shoulder joints, elbow joints, and wrist joints have good range of motion with no discomfort.  Tenderness over the medial epicondyle of the right elbow.  Tenderness and synovitis of the right third MCP and PIP joint.  Tenderness over the left third PIP joint.  Hip joints have good range of motion with no discomfort.  Knee  joints have good range of motion with no warmth or effusion.  Ankle joints have good range of motion with no tenderness or joint swelling.  CDAI Exam: CDAI Score: 6.2  Patient Global: 7 mm; Provider Global: 5 mm Swollen: 2 ; Tender: 3  Joint Exam 05/28/2021      Right  Left  MCP 3  Swollen Tender     PIP 3  Swollen Tender   Tender     Investigation: No additional findings.  Imaging: No results found.  Recent Labs: Lab Results  Component Value Date   WBC 7.1 05/19/2021   HGB 14.5 05/19/2021   PLT 187 05/19/2021   NA 142 05/19/2021   K 4.6 05/19/2021   CL 104 05/19/2021   CO2 31 05/19/2021   GLUCOSE 85 05/19/2021   BUN 13 05/19/2021   CREATININE 0.66 05/19/2021   BILITOT 0.7 05/19/2021   ALKPHOS 66 03/31/2017   AST 16 05/19/2021   ALT 14 05/19/2021   PROT 6.3 05/19/2021   ALBUMIN 4.1 03/31/2017   CALCIUM 10.1 05/19/2021   GFRAA 105 02/24/2021   QFTBGOLDPLUS NEGATIVE 11/27/2020    Speciality Comments: PLQ Eye Exam: 09/30/2020  WNL @ [facility] [phone] . Follow up in 1 year..  Procedures:  No procedures performed Allergies: Latex, Other, and Percocet [oxycodone-acetaminophen]   Assessment / Plan:     Visit Diagnoses: Rheumatoid arthritis involving multiple sites with positive rheumatoid factor (HCC) - +RF, +CCP: She presents today with tenderness and synovitis over the right third MCP and PIP joint.  She has been experiencing ongoing pain and inflammation in the right hand despite starting on arava at her last visit on 04/03/21.  She has continued to take Plaquenil 200 mg 1 tablet by mouth twice daily Monday through Friday.  She was prescribed a prednisone taper starting 20 mg tapering by 5 mg every 4 days as bridging therapy as she was starting on Arava.  The prednisone  taper initially alleviated her joint pain and inflammation.  Since starting on [place] she has had 2-3 bowel movements per day as well as has noticed increased  hair loss.  She had a gap in therapy for about 2 weeks due to testing positive for COVID-19, but she has resumed arava 20 mg daily for the past 2-3 weeks. She has not noticed any improvement in her symptoms on arava.  Different treatment options were discussed today in detail.  She was advised to discontinue Arava since due to intolerance and inadequate response.  The indications, contraindications, potential side effects of Enbrel were discussed today in detail.  All questions were addressed and consent was obtained.  We will apply for Enbrel 50 mg few days injections once weekly through her insurance and once approved she will return to the office for administration of the first injection.  She will remain on Plaquenil as prescribed.  A prednisone taper starting at 20 mg tapering by 5 mg every 4 days was sent to the pharmacy today.  She will follow-up in the office in about 8 weeks to assess her response to PLQ in combination with enbrel.    Medication counseling:   TB Test: Negative on 11/27/20  Hepatitis panel: Hep B negative on 11/27/20  Hep C negative 04/24/11 HIV: negative 11/27/20 SPEP: future order will be placed today  Immunoglobulins: 11/27/20 Chest x-ray: 02/27/16 Does patient have diagnosis of heart failure?  No  Counseled patient that Enbrel is a TNF blocking agent.  Reviewed Enbrel dose of 50 mg once weekly.  Counseled patient on purpose, proper use, and adverse effects of Enbrel.  Reviewed the most common adverse effects including infections, headache, and injection site reactions. Discussed that there is the possibility of an increased risk of malignancy but it is not well understood if this increased risk is due to the medication or the disease state.  Advised patient to get yearly dermatology exams due to risk of skin cancer.  Reviewed the importance of regular labs while on Enbrel therapy.  Advised patient to get standing labs one month after starting Enbrel then every 2 months.  Provided patient  with standing lab orders.  Counseled patient that Enbrel should be held prior to scheduled surgery.  Counseled patient to avoid live vaccines while on Enbrel.  Advised patient to get annual influenza vaccine and the pneumococcal vaccine as needed.  Provided patient with medication education material and answered all questions.  Patient voiced understanding.  Patient consented to Enbrel.  Will upload consent into the media tab.  Reviewed storage instructions for Enbrel.  Advised initial injection must be administered in office.  Patient voiced understanding.    High risk medication use - Applying for Enbrel 50 mg sq injections once weekly.  She will continue taking Plaquenil 200 mg 1 tablet by mouth twice daily, Monday through Friday only. PLQ Eye Exam: 09/30/2020.  D/c Arava due to hair loss, increased bowel movements, and inadequate response.  CBC and CMP were updated on 05/19/2021.  Her next lab work will be due at the end of November 2022 and every 3 months to monitor for drug toxicity.  Standing orders for CBC and CMP remain in place.  PLQ Eye Exam: 09/30/2020 WNL @ [facility] [phone] . Follow up in 1 year. Discussed the importance of yearly skin exams while on Enbrel. Discussed the importance of holding Enbrel if she develops signs or symptoms of an infection and to resume once infection  is completely cleared.  Primary osteoarthritis of both hands: She has PIP and DIP prominence consistent with OA of both hands.  Discussed the importance of joint protection and muscle strengthening.   Dupuytren's contracture of right hand - Unchanged.  Medial epicondylitis, right elbow: She has tenderness over the medial epicondyle of the right elbow.    Primary osteoarthritis of both knees: She has good ROM of both knee joints with crepitus bilaterally.  No warmth or effusion was noted.   Chondromalacia of left patella: Left knee crepitus noted.  No warmth or effusion  noted.   DDD (degenerative disc disease), cervical: She has good ROM with no discomfort.   Psoriasis: No active psoriasis at this time.   Osteopenia of multiple sites - DEXA updated on 04/24/2019: The BMD measured at Femur Neck Right is 0.823 g/cm2 with a T-score of -1.5.  DEXA ordered by her PCP at the [facility].  She continues to take calcium and vitamin D supplement daily.  History of breast cancer: Bilateral mastectomy 2009.  No RTX.  No longer followed by oncology. Dr. Estanislado Pandy is ok with the patient starting on Enbrel.   Other medical conditions are listed as follows:  History of asthma  Morton's neuroma of right foot  History of anemia  Heart murmur  Orders: Orders Placed This Encounter  Procedures   Serum protein electrophoresis with reflex    Meds ordered this encounter  Medications   predniSONE (DELTASONE) 5 MG tablet    Sig: Take 4 tabs po qd x 4 days, 3  tabs po qd x 4 days, 2  tabs po qd x 4 days, 1  tab po qd x 4 days    Dispense:  40 tablet    Refill:  0       Follow-Up Instructions: Return in about 2 months (around 07/28/2021) for Rheumatoid arthritis, Osteoarthritis.   Ofilia Neas, PA-C  Note - This record has been created using Dragon software.  Chart creation errors have been sought, but may not always  have been located. Such creation errors do not reflect on  the standard of medical care.

## 2021-05-19 ENCOUNTER — Other Ambulatory Visit: Payer: Self-pay | Admitting: *Deleted

## 2021-05-19 DIAGNOSIS — Z79899 Other long term (current) drug therapy: Secondary | ICD-10-CM

## 2021-05-20 LAB — COMPLETE METABOLIC PANEL WITH GFR
AG Ratio: 1.9 (calc) (ref 1.0–2.5)
ALT: 14 U/L (ref 6–29)
AST: 16 U/L (ref 10–35)
Albumin: 4.1 g/dL (ref 3.6–5.1)
Alkaline phosphatase (APISO): 56 U/L (ref 37–153)
BUN: 13 mg/dL (ref 7–25)
CO2: 31 mmol/L (ref 20–32)
Calcium: 10.1 mg/dL (ref 8.6–10.4)
Chloride: 104 mmol/L (ref 98–110)
Creat: 0.66 mg/dL (ref 0.50–1.03)
Globulin: 2.2 g/dL (calc) (ref 1.9–3.7)
Glucose, Bld: 85 mg/dL (ref 65–99)
Potassium: 4.6 mmol/L (ref 3.5–5.3)
Sodium: 142 mmol/L (ref 135–146)
Total Bilirubin: 0.7 mg/dL (ref 0.2–1.2)
Total Protein: 6.3 g/dL (ref 6.1–8.1)
eGFR: 103 mL/min/{1.73_m2} (ref >=60)

## 2021-05-20 LAB — CBC WITH DIFFERENTIAL/PLATELET
Absolute Monocytes: 447 cells/uL (ref 200–950)
Basophils Absolute: 50 cells/uL (ref 0–200)
Basophils Relative: 0.7 %
Eosinophils Absolute: 2144 cells/uL — ABNORMAL HIGH (ref 15–500)
Eosinophils Relative: 30.2 %
HCT: 42.6 % (ref 35.0–45.0)
Hemoglobin: 14.5 g/dL (ref 11.7–15.5)
Lymphs Abs: 1612 cells/uL (ref 850–3900)
MCH: 31.9 pg (ref 27.0–33.0)
MCHC: 34 g/dL (ref 32.0–36.0)
MCV: 93.6 fL (ref 80.0–100.0)
MPV: 11.4 fL (ref 7.5–12.5)
Monocytes Relative: 6.3 %
Neutro Abs: 2847 cells/uL (ref 1500–7800)
Neutrophils Relative %: 40.1 %
Platelets: 187 10*3/uL (ref 140–400)
RBC: 4.55 10*6/uL (ref 3.80–5.10)
RDW: 12.6 % (ref 11.0–15.0)
Total Lymphocyte: 22.7 %
WBC: 7.1 10*3/uL (ref 3.8–10.8)

## 2021-05-21 ENCOUNTER — Other Ambulatory Visit: Payer: Self-pay | Admitting: Physician Assistant

## 2021-05-21 NOTE — Telephone Encounter (Signed)
Patient states she would prefer to have 1 tablet per day. Prescription changed and sent to [facility] for review and to send to the pharmacy.

## 2021-05-21 NOTE — Telephone Encounter (Signed)
Last Visit: 04/03/2021   Next Visit: 05/28/2021   Labs: 05/19/2021 CMP WNL.  Absolute eosinophils are elevated.  May be due to allergies.  WBC count is WNL.  Rest of CBC WNL.   Current Dose per office note 04/03/2021: Arava 10 mg daily for 2 weeks and if labs are stable she will increase to 20 mg daily. Spoke with patient and she has increase to Arava 20 mg daily.    [id number] arthritis involving multiple sites with positive rheumatoid factor   Last Fill: 04/03/2021  Okay to refill Arava?

## 2021-05-21 NOTE — Telephone Encounter (Signed)
Please clarify if she would like the 20 mg tablets so she only has to take 1 tablet daily.

## 2021-05-28 ENCOUNTER — Other Ambulatory Visit: Payer: Self-pay

## 2021-05-28 ENCOUNTER — Telehealth: Payer: Self-pay

## 2021-05-28 ENCOUNTER — Ambulatory Visit: Payer: 59 | Admitting: Physician Assistant

## 2021-05-28 ENCOUNTER — Encounter: Payer: Self-pay | Admitting: Physician Assistant

## 2021-05-28 VITALS — BP 116/73 | HR 64 | Ht 66.0 in | Wt 165.4 lb

## 2021-05-28 DIAGNOSIS — Z853 Personal history of malignant neoplasm of breast: Secondary | ICD-10-CM

## 2021-05-28 DIAGNOSIS — Z8709 Personal history of other diseases of the respiratory system: Secondary | ICD-10-CM

## 2021-05-28 DIAGNOSIS — M720 Palmar fascial fibromatosis [Dupuytren]: Secondary | ICD-10-CM

## 2021-05-28 DIAGNOSIS — M7701 Medial epicondylitis, right elbow: Secondary | ICD-10-CM

## 2021-05-28 DIAGNOSIS — Z862 Personal history of diseases of the blood and blood-forming organs and certain disorders involving the immune mechanism: Secondary | ICD-10-CM

## 2021-05-28 DIAGNOSIS — R011 Cardiac murmur, unspecified: Secondary | ICD-10-CM

## 2021-05-28 DIAGNOSIS — M19042 Primary osteoarthritis, left hand: Secondary | ICD-10-CM

## 2021-05-28 DIAGNOSIS — M2242 Chondromalacia patellae, left knee: Secondary | ICD-10-CM

## 2021-05-28 DIAGNOSIS — M7712 Lateral epicondylitis, left elbow: Secondary | ICD-10-CM

## 2021-05-28 DIAGNOSIS — M170 Bilateral primary osteoarthritis of knee: Secondary | ICD-10-CM

## 2021-05-28 DIAGNOSIS — M5030 Other cervical disc degeneration, unspecified cervical region: Secondary | ICD-10-CM

## 2021-05-28 DIAGNOSIS — Z79899 Other long term (current) drug therapy: Secondary | ICD-10-CM | POA: Diagnosis not present

## 2021-05-28 DIAGNOSIS — G5761 Lesion of plantar nerve, right lower limb: Secondary | ICD-10-CM

## 2021-05-28 DIAGNOSIS — M19041 Primary osteoarthritis, right hand: Secondary | ICD-10-CM

## 2021-05-28 DIAGNOSIS — M8589 Other specified disorders of bone density and structure, multiple sites: Secondary | ICD-10-CM

## 2021-05-28 DIAGNOSIS — M0579 Rheumatoid arthritis with rheumatoid factor of multiple sites without organ or systems involvement: Secondary | ICD-10-CM

## 2021-05-28 DIAGNOSIS — M7711 Lateral epicondylitis, right elbow: Secondary | ICD-10-CM

## 2021-05-28 DIAGNOSIS — L409 Psoriasis, unspecified: Secondary | ICD-10-CM

## 2021-05-28 MED ORDER — PREDNISONE 5 MG PO TABS: ORAL_TABLET | ORAL | 0 refills | Status: DC

## 2021-05-28 NOTE — Patient Instructions (Signed)
Standing Labs We placed an order today for your standing lab work.   Please have your standing labs drawn in 1 month then every 3 months   If possible, please have your labs drawn 2 weeks prior to your appointment so that the provider can discuss your results at your appointment.  Please note that you may see your imaging and lab results in [place] before we have reviewed them. We may be awaiting multiple results to interpret others before contacting you. Please allow our office up to 72 hours to thoroughly review all of the results before contacting the office for clarification of your results.  We have open lab daily: Monday through Thursday from 1:30-4:30 PM and Friday from 1:30-4:00 PM at the office of Dr. Bo Merino, [facility].   Please be advised, all patients with office appointments requiring lab work will take precedent over walk-in lab work.  If possible, please come for your lab work on Monday and Friday afternoons, as you may experience shorter wait times. The office is located at [address], [place], [place], [place] [address] No appointment is necessary.   Labs are drawn by [facility]. Please bring your co-pay at the time of your lab draw.  You may receive a bill from [facility] for your lab work.  If you wish to have your labs drawn at another location, please call the office 24 hours in advance to send orders.  If you have any questions regarding directions or hours of operation,  please call [phone].   As a reminder, please drink plenty of water prior to coming for your lab work. Thanks!    Etanercept Injection What is this medication? ETANERCEPT (et a [employer]) is used for the treatment of rheumatoid arthritis. The medicine is also used to treat psoriatic arthritis, ankylosing spondylitis, and psoriasis. This medicine may be used for other purposes; ask your health care provider or pharmacist if you have questions. COMMON BRAND NAME(S):  Enbrel What should I tell my care team before I take this medication? They need to know if you have any of these conditions: bleeding disorder cancer diabetes granulomatosis with polyangiitis heart failure HIV or AIDs immune system problems infection such as tuberculosis (TB) or other bacterial, fungal or viral infections liver disease nervous system problems such as Guillain-Barre syndrome, multiple sclerosis or seizures recent or upcoming vaccine an unusual or allergic reaction to etanercept, other medicines, latex, rubber, food, dyes, or preservatives pregnant or trying to get pregnant breast-feeding How should I use this medication? The medicine is injected under the skin. You will be taught how to prepare and give it. Take it as directed on the prescription label. Keep taking it unless your health care provider tells you stop. This medicine comes with INSTRUCTIONS FOR USE. Ask your pharmacist for directions on how to use this medicine. Read the information carefully. Talk to your pharmacist or health care provider if you have questions. If you use a pen, be sure to take off the outer needle cover before using the dose. It is important that you put your used needles and syringes in a special sharps container. Do not put them in a trash can. If you do not have a sharps container, call your pharmacist or health care provider to get one. A special MedGuide will be given to you by the pharmacist with each prescription and refill. Be sure to read this information carefully each time. Talk to your health care provider about the use of this  medicine in children. While it may be prescribed for children as young as 44 years of age for selected conditions, precautions do apply. Overdosage: If you think you have taken too much of this medicine contact a poison control center or emergency room at once. NOTE: This medicine is only for you. Do not share this medicine with others. What if I miss a  dose? If you miss a dose, take it as soon as you can. If it is almost time for your next dose, take only that dose. Do not take double or extra doses. What may interact with this medication? Do not take this medicine with any of the following medications: biologic medicines such as adalimumab, certolizumab, golimumab, infliximab live vaccines rilonacept This medicine may also interact with the following medications: abatacept anakinra biologic medicines such as anifrolumab, baricitinib, belimumab, canakinumab, natalizumab, rituximab, sarilumab, tocilizumab, tofacitinib, upadacitinib, vedolizumab cyclophosphamide sulfasalazine This list may not describe all possible interactions. Give your health care provider a list of all the medicines, herbs, non-prescription drugs, or dietary supplements you use. Also tell them if you smoke, drink alcohol, or use illegal drugs. Some items may interact with your medicine. What should I watch for while using this medication? Visit your health care provider for regular checks on your progress. Tell your health care provider if your symptoms do not start to get better or if they get worse. This medicine may increase your risk of getting an infection. Call your health care provider for advice if you get a fever, chills, sore throat, or other symptoms of a cold or flu. Do not treat yourself. Try to avoid being around people who are sick. If you have not had the measles or chickenpox vaccines, tell your health care provider right away if you are around someone with these viruses. You will be tested for tuberculosis (TB) before you start this medicine. If your doctor prescribes any medicine for TB, you should start taking the TB medicine before starting this medicine. Make sure to finish the full course of TB medicine. Avoid taking medicines that contain aspirin, acetaminophen, ibuprofen, naproxen, or ketoprofen unless instructed by your health care provider. These  medicines may hide fever. Talk to your health care provider about your risk of cancer. You may be more at risk for certain types of cancer if you take this medicine. This medicine can decrease the response to a vaccine. If you need to get vaccinated, tell your health care provider if you have received this medicine. Extra booster doses may be needed. Talk to your health care provider to see if a different vaccination schedule is needed. What side effects may I notice from receiving this medication? Side effects that you should report to your health care provider as soon as possible: allergic reactions (skin rash, itching or hives; swelling of the face, lips, or tongue) changes in vision dizziness heart failure (trouble breathing; fast, irregular heartbeat; sudden weight gain; swelling of the ankles, feet, hands; unusually weak or tired) infection (fever, chills, cough, sore throat, pain or trouble passing urine) light-colored stools liver injury (dark yellow or brown urine; general ill feeling or flu-like symptoms; loss of appetite, right upper belly pain; unusually weak or tired, yellowing of the eyes or skin) low red blood cell counts (trouble breathing; feeling faint; lightheaded, falls; unusually weak or tired) pain, tingling, numbness in the hands or feet red, scaly patches or raised bumps on the skin seizures unusual bruising or bleeding weakness in the arms or legs Side effects  that usually do not require medical attention (report to your health care provider if they continue or are bothersome): headache nausea pain, redness, or irritation at site where injected vomiting This list may not describe all possible side effects. Call your doctor for medical advice about side effects. You may report side effects to FDA at 1-800-FDA-1088. Where should I keep my medication? Keep out of the reach of children and pets. See product for storage information. Each product may have different  instructions. Get rid of any unused medicine after the expiration date. To get rid of medicines that are no longer needed or have expired: Take the medicine to a medicine take-back program. Check with your pharmacy or law enforcement to find a location. If you cannot return the medicine, ask your pharmacist or health care provider how to get rid of this medicine safely. NOTE: This sheet is a summary. It may not cover all possible information. If you have questions about this medicine, talk to your doctor, pharmacist, or health care provider.  2022 Elsevier/Gold Standard (2020-06-21 12:59:29)

## 2021-05-28 NOTE — Telephone Encounter (Signed)
Please apply for enbrel per Hazel Sams, PA-C. Thanks!   Consent obtained and sent to the scan center.

## 2021-05-29 NOTE — Telephone Encounter (Signed)
Submitted a Prior Authorization request to [facility] for ENBREL via CoverMyMeds. Will update once we receive a response.   Key: [id number]

## 2021-05-30 NOTE — Telephone Encounter (Signed)
Ok to proceed with Humira.

## 2021-05-30 NOTE — Telephone Encounter (Signed)
Received a fax regarding Prior Authorization from [facility] for ENBREL. Authorization has been DENIED because patient does not have history of failure/contraindication/intolerance to these preferred biologic products: Cimzia, Humira, Simponi, Olumiant, Rinvoq, Xeljanz.  Reviewer Phone# [phone]

## 2021-06-02 ENCOUNTER — Other Ambulatory Visit (HOSPITAL_COMMUNITY): Payer: Self-pay

## 2021-06-02 NOTE — Telephone Encounter (Signed)
Submitted a Prior Authorization request to [facility] for HUMIRA via CoverMyMeds. Will update once we receive a response.   Key: [id number]

## 2021-06-02 NOTE — Telephone Encounter (Addendum)
Received notification from [facility] regarding a prior authorization for [place]. Authorization has been APPROVED from 06/02/2021 to 06/02/2022.   Patient must fill through [place]: [phone]   Authorization # [id number]  Enrolling pt into the [employer] Complete program

## 2021-06-02 NOTE — Telephone Encounter (Signed)
Humira copay card info:  RxBIN: [id number] RxPCN: [id number] RxGRP: [id number] ID: [id number] Suf: 01

## 2021-06-02 NOTE — Telephone Encounter (Signed)
Called patient to notify that Enbrel was denied and Humira was approved. She is scheduled for Humira new start on 06/05/21 @ 9am.  Knox Saliva, PharmD, MPH, BCPS Clinical Pharmacist (Rheumatology and Pulmonology)

## 2021-06-04 NOTE — Progress Notes (Addendum)
Pharmacy Note  Subjective:   Patient presents to clinic today to receive first dose of Humira for rheumatoid arthritis. Initial plan was to pursue Enbrel however her insurance denied this. She is naive to biologics and but has self-injected hormonal therapy before using vial/syringe.medications.  Patient running a fever or have signs/symptoms of infection? No  Patient currently on antibiotics for the treatment of infection? No  Patient have any upcoming invasive procedures/surgeries? No  Objective: CMP     Component Value Date/Time   NA 142 05/19/2021 1343   NA 143 10/05/2013 0911   K 4.6 05/19/2021 1343   K 4.4 10/05/2013 0911   CL 104 05/19/2021 1343   CL 104 12/26/2012 1213   CO2 31 05/19/2021 1343   CO2 26 10/05/2013 0911   GLUCOSE 85 05/19/2021 1343   GLUCOSE 94 10/05/2013 0911   GLUCOSE 89 12/26/2012 1213   BUN 13 05/19/2021 1343   BUN 9.7 10/05/2013 0911   CREATININE 0.66 05/19/2021 1343   CREATININE 0.8 10/05/2013 0911   CALCIUM 10.1 05/19/2021 1343   CALCIUM 9.5 10/05/2013 0911   PROT 6.3 05/19/2021 1343   PROT 6.8 10/05/2013 0911   ALBUMIN 4.1 03/31/2017 1655   ALBUMIN 3.9 10/05/2013 0911   AST 16 05/19/2021 1343   AST 19 10/05/2013 0911   ALT 14 05/19/2021 1343   ALT 19 10/05/2013 0911   ALKPHOS 66 03/31/2017 1655   ALKPHOS 57 10/05/2013 0911   BILITOT 0.7 05/19/2021 1343   BILITOT 0.84 10/05/2013 0911   GFRNONAA 91 02/24/2021 1446   GFRAA 105 02/24/2021 1446    CBC    Component Value Date/Time   WBC 7.1 05/19/2021 1343   RBC 4.55 05/19/2021 1343   HGB 14.5 05/19/2021 1343   HGB 12.0 02/07/2018 1326   HGB 13.0 02/03/2017 1326   HGB 14.2 10/05/2013 0911   HCT 42.6 05/19/2021 1343   HCT 36.9 02/07/2018 1326   HCT 41.3 10/05/2013 0911   PLT 187 05/19/2021 1343   PLT 264 02/07/2018 1326   MCV 93.6 05/19/2021 1343   MCV 85 02/07/2018 1326   MCV 95.8 10/05/2013 0911   MCH 31.9 05/19/2021 1343   MCHC 34.0 05/19/2021 1343   RDW 12.6 05/19/2021  1343   RDW 15.8 (H) 02/07/2018 1326   RDW 13.6 10/05/2013 0911   LYMPHSABS 1,612 05/19/2021 1343   LYMPHSABS 1.6 02/07/2018 1326   LYMPHSABS 1.1 10/05/2013 0911   MONOABS 0.5 07/05/2018 1421   MONOABS 0.4 10/05/2013 0911   EOSABS 2,144 (H) 05/19/2021 1343   EOSABS 0.2 02/07/2018 1326   BASOSABS 50 05/19/2021 1343   BASOSABS 0.0 02/07/2018 1326   BASOSABS 0.0 10/05/2013 0911    Baseline Immunosuppressant Therapy Labs TB GOLD Quantiferon TB Gold Latest Ref Rng & Units 11/27/2020  Quantiferon TB Gold Plus NEGATIVE NEGATIVE   Hepatitis Panel Hepatitis Latest Ref Rng & Units 11/27/2020  Hep B Surface Ag NON-REACTI NON-REACTIVE  Hep B IgM NON-REACTI NON-REACTIVE  Hep C Ab NEGATIVE -   HIV Lab Results  Component Value Date   HIV NON-REACTIVE 11/27/2020   Immunoglobulins Immunoglobulin Electrophoresis Latest Ref Rng & Units 11/27/2020  IgA  47 - 310 mg/dL 420(H)  IgG 600 - 1,640 mg/dL 675  IgM 50 - 300 mg/dL 80   SPEP Serum Protein Electrophoresis Latest Ref Rng & Units 05/19/2021  Total Protein 6.1 - 8.1 g/dL 6.3   G6PD No results found for: G6PDH TPMT No results found for: TPMT   Chest x-ray: 02/27/16 -  no acute cardiopulmonary disease  Assessment/Plan:  Counseled patient that Humira is a TNF blocking agent.  Counseled patient on purpose, proper use, and adverse effects of Humira.  Reviewed the most common adverse effects including infections, headache, and injection site reactions. Discussed that there is the possibility of an increased risk of malignancy including non-melanoma skin cancer but it is not well understood if this increased risk is due to the medication or the disease state.  Advised patient to get yearly dermatology exams due to risk of skin cancer. Counseled patient that Humira should be held prior to scheduled surgery.  Counseled patient to avoid live vaccines while on Humira.  Recommend annual influenza, PCV 15 or PCV20 or Pneumovax 23, and Shingrix as indicated.   She has been advised that she does not have to hold Humira for zoster vaccine.  Reviewed the importance of regular labs while on Humira therapy.  Will monitor CBC and CMP 1 month after starting and then every 3 months routinely thereafter. Will monitor TB gold annually. Standing orders placed.    Provided patient with medication education material and answered all questions.  Patient consented to Humira (at her OV she had consented to Enbrel).  Will upload consent into the media tab.  Reviewed storage instructions of Humira.  Dermatology referral to [facility].  Dose will be for rheumatoid arthritis Humira 40 mg every 14 days.   Demonstrated proper injection technique with Humira demo device  Patient able to demonstrate proper injection technique using the teach back method.  Patient self injected in the right lower abdomen with:  Sample Medication: Humira '40mg'$ /0.74m NDC: [id number]: [id number]: 07/2022  Patient tolerated well.  Observed for 30 mins in office for adverse reaction and some redness noted. She states that she did not have itchiness at site but has been advised to ice area liberally. Of note, she does have allergy to latex and we reviewed injection site reaction in detail.   Patient is to return in 1 month for labs and 6-8 weeks for follow-up appointment.  Standing orders remain in place.   She will continue Humira '40mg'$  every 14 days and hydroxychloroquine '200mg'$  twice daily on Monday through Fridays only  Humira was approved through insurance .   [place]: [facility] [phone] copay card information.  Patient provided with pharmacy phone number and advised to call later this week to schedule shipment to home.  All questions encouraged and answered.  Instructed patient to call with any further questions or concerns.  DKnox Saliva PharmD, MPH, BCPS Clinical Pharmacist (Rheumatology and Pulmonology)  06/04/2021 8:16 AM

## 2021-06-05 ENCOUNTER — Ambulatory Visit: Payer: 59 | Admitting: Pharmacist

## 2021-06-05 ENCOUNTER — Ambulatory Visit (INDEPENDENT_AMBULATORY_CARE_PROVIDER_SITE_OTHER): Payer: 59 | Admitting: Obstetrics and Gynecology

## 2021-06-05 ENCOUNTER — Other Ambulatory Visit: Payer: Self-pay

## 2021-06-05 ENCOUNTER — Encounter: Payer: Self-pay | Admitting: Obstetrics and Gynecology

## 2021-06-05 VITALS — BP 104/68 | HR 65 | Resp 14 | Ht 64.5 in | Wt 165.0 lb

## 2021-06-05 VITALS — BP 122/71 | HR 65

## 2021-06-05 DIAGNOSIS — Z01419 Encounter for gynecological examination (general) (routine) without abnormal findings: Secondary | ICD-10-CM | POA: Diagnosis not present

## 2021-06-05 DIAGNOSIS — Z7189 Other specified counseling: Secondary | ICD-10-CM

## 2021-06-05 DIAGNOSIS — M0579 Rheumatoid arthritis with rheumatoid factor of multiple sites without organ or systems involvement: Secondary | ICD-10-CM

## 2021-06-05 DIAGNOSIS — Z79899 Other long term (current) drug therapy: Secondary | ICD-10-CM

## 2021-06-05 LAB — LIPID PANEL
Cholesterol: 259 mg/dL — ABNORMAL HIGH (ref <200)
HDL: 76 mg/dL (ref >=50)
LDL Cholesterol (Calc): 158 mg/dL (calc) — ABNORMAL HIGH
Non-HDL Cholesterol (Calc): 183 mg/dL (calc) — ABNORMAL HIGH (ref <130)
Total CHOL/HDL Ratio: 3.4 (calc) (ref <5.0)
Triglycerides: 125 mg/dL (ref <150)

## 2021-06-05 LAB — TSH: TSH: 0.44 mIU/L (ref 0.40–4.50)

## 2021-06-05 LAB — VITAMIN D 25 HYDROXY (VIT D DEFICIENCY, FRACTURES): Vit D, 25-Hydroxy: 58 ng/mL (ref 30–100)

## 2021-06-05 MED ORDER — HUMIRA (2 PEN) 40 MG/0.4ML ~~LOC~~ AJKT: 40.0000 mg | AUTO-INJECTOR | SUBCUTANEOUS | 0 refills | Status: DC

## 2021-06-05 NOTE — Patient Instructions (Signed)

## 2021-06-05 NOTE — Progress Notes (Signed)
55 y.o. G2P2 Married Caucasian female here for annual exam.    Had an RA flare. Just started Humira.  Is also on prednisone.   Wants blood work today.   PCP:   none per patient  Patient's last menstrual period was 11/12/2014.           Sexually active: Yes.    The current method of family planning is status post hysterectomy.    Exercising: Yes.     Jazzercise  Smoker:  no  Health Maintenance: Pap: 08-01-14 negative, HR HPV negative History of abnormal Pap:  no MMG:  2009-- Bilateral mastectomy  Colonoscopy:  05-05-2019 polyp- f/u 3 years BMD:   04-24-2019  Result  osteopenia  TDaP:  07-12-2019 Gardasil:   no HIV: 11-27-20 negative  Hep C: 2017 negative  Screening Labs:  today.   reports that she has never smoked. She has never used smokeless tobacco. She reports current alcohol use of about 2.0 standard drinks per week. She reports that she does not use drugs.  Past Medical History:  Diagnosis Date   Abnormal uterine bleeding    Allergy    Anemia    Arthritis    Asthma    exercised induced asthma - rarely uses inhaler;none since Chemo.   Blood transfusion without reported diagnosis 1976   1976  following surgery at [facility]) 08/2008   left breast--has bilateral mastectomy   Complication of anesthesia    pt states" gets shakes" with general   Cyst near tailbone    Elevated LDL cholesterol level    Environmental and seasonal allergies    Fibroid    Headache    sleeps , no meds   Heart murmur    never has caused any problems   Helicobacter pylori infection    Hemorrhoids    hx of   History of breast cancer    History of shingles    03/07/18   Hypotension    hx of   Post-operative nausea and vomiting    RA (rheumatoid arthritis) (H[facility])    RA (rheumatoid arthritis) ([place])    Scoliosis    tx with robaxin prn - rarely uses   SVD (spontaneous vaginal delivery) 1996   x 1    Past Surgical History:  Procedure Laterality Date   ABDOMINAL HYSTERECTOMY      APPENDECTOMY     BREAST LUMPECTOMY  05/17/2008   x 2 re excise for margin - Dr Margot Chimes, bilateral mastectomy 08/2008   BREAST LUMPECTOMY W/ NEEDLE LOCALIZATION  04/23/2008   w [facility] Dr Margot Chimes   CESAREAN SECTION  01/01/2004   Dr Quincy Simmonds   COLONOSCOPY  03/2018   COLONOSCOPY  05/05/2019   ENDOMETRIAL BIOPSY  06/2013, 2015   x 2   EYE SURGERY  2003   bilateral lasik   FOOT SURGERY     HYSTEROSCOPY WITH D & C  07/21/2011   Procedure: DILATATION AND CURETTAGE (D&C) /HYSTEROSCOPY;  Surgeon: Arloa Koh;  Location: [facility];  Service: Gynecology;  Laterality: N/A;  with Ultrasound Guidance   INSERTION OF TISSUE EXPANDER AFTER MASTECTOMY  09/11/2008   Dr Harlow Mares   KNEE ARTHROSCOPY W/ MENISCAL REPAIR Right 2010   --Dr. Telford Nab   LAPAROSCOPIC ASSISTED VAGINAL HYSTERECTOMY N/A 01/15/2015   Procedure: LAPAROSCOPIC ASSISTED VAGINAL HYSTERECTOMY;  Surgeon: Nunzio Cobbs, MD;  Location: [facility];  Service: Gynecology;  Laterality: N/A;   left foot surgery  2004   left rotator cuff  repair  01/2006   MASTECTOMY  09/11/2008   bilateral - Dr Margot Chimes   [facility] REMOVAL  09/09/2009   Dr Margot Chimes   [facility] PLACEMENT  05/17/2008   Dr Cori Razor rounds of chemo   REMOVAL OF TISSUE EXPANDER AND PLACEMENT OF IMPLANT  12/2008   RHINOPLASTY     SALPINGOOPHORECTOMY Bilateral 01/15/2015   Procedure: BILATERAL SALPINGO OOPHORECTOMY;  Surgeon: Nunzio Cobbs, MD;  Location: [facility];  Service: Gynecology;  Laterality: Bilateral;   UMBILICAL HERNIA REPAIR  09/09/2009   Dr Margot Chimes   UPPER GI ENDOSCOPY  03/2018   WISDOM TOOTH EXTRACTION      Current Outpatient Medications  Medication Sig Dispense Refill   Adalimumab (HUMIRA PEN) 40 MG/0.4ML PNKT Inject 40 mg into the skin every 14 (fourteen) days. 1 kit - 2 pens 6 each 0   calcium carbonate (OS-CAL) 600 MG TABS tablet Take 600 mg by mouth 2 (two) times daily with a meal.     cetirizine (ZYRTEC) 10 MG tablet Take 10 mg by mouth daily.     COLLAGEN PO  Take by mouth daily.     folic acid (FOLVITE) 1 MG tablet TAKE 2 TABLETS BY MOUTH EVERY DAY 180 tablet 2   gabapentin (NEURONTIN) 100 MG capsule Take 100 mg by mouth as needed.     GLUCOSAMINE PO Take 2 tablets by mouth daily.     hydroxychloroquine (PLAQUENIL) 200 MG tablet TAKE 1 TABLET BY MOUTH TWICE A DAY MONDAY-FRIDAY 120 tablet 0   ibuprofen (ADVIL) 200 MG tablet Take 200 mg by mouth as needed.     Magnesium 500 MG TABS Take 1 tablet by mouth 2 (two) times daily.     Manganese 50 MG TABS Take 1 tablet by mouth daily.     methocarbamol (ROBAXIN) 500 MG tablet Take 1 tablet (500 mg total) by mouth every 8 (eight) hours as needed for muscle spasms. 30 tablet 1   pantoprazole (PROTONIX) 40 MG tablet Take 1 tablet (40 mg total) by mouth daily. 90 tablet 4   predniSONE (DELTASONE) 5 MG tablet Take 4 tabs po qd x 4 days, 3  tabs po qd x 4 days, 2  tabs po qd x 4 days, 1  tab po qd x 4 days 40 tablet 0   TURMERIC PO Take by mouth daily.     No current facility-administered medications for this visit.    Family History  Problem Relation Age of Onset   Breast cancer Mother    Diabetes Mother    Hypertension Mother    Stroke Mother    Irritable bowel syndrome Mother    Kidney disease Mother    Heart failure Father    Hypertension Father    Colon cancer Neg Hx    Colon polyps Neg Hx    Esophageal cancer Neg Hx    Pancreatic cancer Neg Hx    Rectal cancer Neg Hx    Stomach cancer Neg Hx     Review of Systems  All other systems reviewed and are negative.  Exam:   BP 104/68 (BP Location: Right Arm, Patient Position: Sitting, Cuff Size: Normal)   Pulse 65   Resp 14   Ht 5' 4.5" (1.638 m)   Wt 165 lb (74.8 kg)   LMP 11/12/2014   BMI 27.88 kg/m     General appearance: alert, cooperative and appears stated age Head: normocephalic, without obvious abnormality, atraumatic Neck: no adenopathy, supple, symmetrical, trachea midline and thyroid normal  to inspection and palpation Lungs:  clear to auscultation bilaterally Breasts: absent.  Bilateral implants with reconstruction.  Heart: regular rate and rhythm Abdomen: soft, non-tender; no masses, no organomegaly Extremities: extremities normal, atraumatic, no cyanosis or edema Skin: skin color, texture, turgor normal. No rashes or lesions Lymph nodes: cervical, supraclavicular, and axillary nodes normal. Neurologic: grossly normal  Pelvic: External genitalia:  no lesions              No abnormal inguinal nodes palpated.              Urethra:  normal appearing urethra with no masses, tenderness or lesions              Bartholins and Skenes: normal                 Vagina: normal appearing vagina with normal color and discharge, atrophy.               Cervix: absent              Pap taken: no Bimanual Exam:  Uterus: absent.              Adnexa: no mass, fullness, tenderness              Rectal exam: yes.  Confirms.              Anus:  normal sphincter tone, no lesions  Chaperone was present for exam:  Raquel Sarna, RN.  Assessment:   Well woman visit with gynecologic exam. Status post bilateral mastectomy with reconstruction.  Status post LAVH/BSO.  Osteopenia.  RA.  On Humira.  Steroid use. Hx anemia.  Plan:  Pap and HR HPV not needed by guidelines. Guidelines for Calcium, Vitamin D, regular exercise program including cardiovascular and weight bearing exercise. Check lipids, vit D and TSH.  [facility]. Follow up annually and prn.   After visit summary provided.

## 2021-06-05 NOTE — Patient Instructions (Addendum)
Your next HUMIRA dose is due on 9/29, 10/13, and every 14 days thereafter CONTINUE hydroxychloroquine/Plaquenil STOP leflunomide/Arava.  We placed referral for [facility] today for yearly skin exams  HOLD HUMIRA if you have signs or symptoms of an infection. You can resume once you feel better or back to your baseline. HOLD HUMIRA if you start antibiotics to treat an infection. HOLD HUMIRA around the time of surgery/procedures. Your surgeon will be able to provide recommendations on when to hold BEFORE and when you are cleared to [place].  Pharmacy information: Your prescription will be shipped from [facility]. Their phone number is [phone] Please call to schedule shipment and confirm address. They will mail your medication to your home.  Cost information: Your copay should be affordable. If you call the pharmacy and it is not affordable, please double-check that they are billing through your copay card as secondary coverage. That copay card information is: RxBIN: [id number] RxPCN: [id number] RxGRP: [id number] ID: [id number] Suf: 01  Labs are due in 1 month then every 3 months. Lab hours are from Monday to Thursday 1:30-4:30pm and Friday 1:30-4pm. You do not need an appointment if you come for labs during these times.  How to manage an injection site reaction: Remember the 5 C's: COUNTER - leave on the counter at least 30 minutes but up to overnight to bring medication to room temperature. This may help prevent stinging COLD - place something cold (like an ice gel pack or cold water bottle) on the injection site just before cleansing with alcohol. This may help reduce pain CLARITIN - use Claritin (generic name is loratadine) for the first two weeks of treatment or the day of, the day before, and the day after injecting. This will help to minimize injection site reactions CORTISONE CREAM - apply if injection site is irritated and itching CALL ME - if injection site  reaction is bigger than the size of your fist, looks infected, blisters, or if you develop hives

## 2021-07-01 ENCOUNTER — Telehealth: Payer: Self-pay

## 2021-07-01 NOTE — Telephone Encounter (Signed)
I called patient, patient verbalized understanding. 

## 2021-07-01 NOTE — Telephone Encounter (Signed)
Please advise the patient to hold Humira this week and then for 1-2 weeks after her symptoms have completely resolved.

## 2021-07-01 NOTE — Telephone Encounter (Signed)
Patient called stating she tested positive for Covid yesterday (06/30/21).  Patient states she has fever, congestion and fatigue.  Patient is due for Humira injection on Thursday, 07/03/21 and requested a return call to let her know if she should hold the medication.

## 2021-07-14 ENCOUNTER — Other Ambulatory Visit: Payer: Self-pay | Admitting: Rheumatology

## 2021-07-15 NOTE — Progress Notes (Signed)
Office Visit Note  Patient: Rebecca Fleming             Date of Birth: [birth year]           MRN: [id number]             PCP: Nunzio Cobbs, MD Referring: Aundria Rud* Visit Date: 07/29/2021 Occupation: @GUAROCC @  Subjective:  Left lip numbness   History of Present Illness: Rebecca Fleming is a 55 y.o. female with history of seropositive rheumatoid arthritis and osteoarthritis.  She is on humira 40 mg sq injections every 14 days and plaquenil 200 mg 1 tablet by mouth twice daily Monday through Friday. She was started on humira on 06/05/21.  She has had a total of 3 doses of humira.  She states she continues to have tenderness of the right 3rd MCP joint but denies any joint swelling.  She experiencing stiffness in the left hip after sitting for prolonged periods of time.  She performs stretching exercises daily.  She denies any other joint pain or joint swelling at this time.  She states 2 weeks ago she noticed numbness on the left side of her lip extending along her jaw.  She was evaluated by her dentist on 07/23/21 and had x-rays at that time. According to the patient her dentist saw several very small blisters around her gums on the left side raising the concern for shingles.  She was given a prescription for acyclovir. She took several days of acyclovir with no improvement in her symptoms so she discontinued.  She has not noticed any new or worsening of symptoms.  She denies any lesions or rashes on her face.  She denies any tooth or jaw pain.  She denies any headaches or fevers.  She states she had shingles in 2019 on her right upper thigh but states her symptoms currently do not feel like shingles.  She denies eating any new foods or using new products. She denies any other patches of numbness on her body.   She denies any changes in taste, vision, or hearing.   Activities of Daily Living:  Patient reports morning stiffness for 0 minutes  Patient Denies nocturnal pain.   Difficulty dressing/grooming: Denies Difficulty climbing stairs: Denies Difficulty getting out of chair: Reports Difficulty using hands for taps, buttons, cutlery, and/or writing: Denies  Review of Systems  Constitutional:  Positive for fatigue.  HENT:  Negative for mouth sores, mouth dryness and nose dryness.   Eyes:  Negative for pain, visual disturbance and dryness.  Respiratory:  Negative for cough, hemoptysis, shortness of breath and difficulty breathing.   Cardiovascular:  Negative for chest pain, palpitations, hypertension and swelling in legs/feet.  Gastrointestinal:  Negative for blood in stool, constipation and diarrhea.  Endocrine: Negative for increased urination.  Genitourinary:  Negative for painful urination.  Musculoskeletal:  Positive for joint pain and joint pain. Negative for joint swelling, myalgias, muscle weakness, morning stiffness, muscle tenderness and myalgias.  Skin:  Negative for color change, pallor, rash, hair loss, nodules/bumps, skin tightness, ulcers and sensitivity to sunlight.  Allergic/Immunologic: Negative for susceptible to infections.  Neurological:  Positive for numbness. Negative for dizziness, headaches and weakness.  Hematological:  Negative for swollen glands.  Psychiatric/Behavioral:  Negative for depressed mood and sleep disturbance. The patient is not nervous/anxious.    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   Blood transfusion without reported diagnosis 1976   1976  following surgery at [facility]) 08/2008   left breast--has bilateral mastectomy   Complication of anesthesia    pt states" gets shakes" with general   Cyst near tailbone    Elevated LDL cholesterol level    Environmental and seasonal allergies    Fibroid    Headache    sleeps , no meds   Heart murmur    never has caused any problems   Helicobacter pylori infection    Hemorrhoids    hx of   History of breast cancer    History of shingles    03/07/18   Hypotension    hx of   Post-operative nausea and vomiting    RA (rheumatoid arthritis) (HCC)    RA (rheumatoid arthritis) ([place])    Scoliosis    tx with robaxin prn - rarely uses   SVD (spontaneous vaginal delivery) 1996   x 1    Family History  Problem Relation Age of Onset   Breast cancer Mother    Diabetes Mother    Hypertension Mother    Stroke Mother    Irritable bowel syndrome Mother    Kidney disease Mother    Heart failure Father    Hypertension Father    Colon cancer Neg Hx    Colon polyps Neg Hx    Esophageal cancer Neg Hx    Pancreatic cancer Neg Hx    Rectal cancer Neg Hx    Stomach cancer Neg Hx    Past Surgical History:  Procedure Laterality Date   ABDOMINAL HYSTERECTOMY     APPENDECTOMY     BREAST LUMPECTOMY  05/17/2008   x 2 re excise for margin - Dr Margot Chimes, bilateral mastectomy 08/2008   BREAST LUMPECTOMY W/ NEEDLE LOCALIZATION  04/23/2008   w [facility] Dr Margot Chimes   CESAREAN SECTION  01/01/2004   Dr Quincy Simmonds   COLONOSCOPY  03/2018   COLONOSCOPY  05/05/2019   ENDOMETRIAL BIOPSY  06/2013, 2015   x 2   EYE SURGERY  2003   bilateral lasik   FOOT SURGERY     HYSTEROSCOPY WITH D & C  07/21/2011    Procedure: DILATATION AND CURETTAGE (D&C) /HYSTEROSCOPY;  Surgeon: Arloa Koh;  Location: [facility];  Service: Gynecology;  Laterality: N/A;  with Ultrasound Guidance   INSERTION OF TISSUE EXPANDER AFTER MASTECTOMY  09/11/2008   Dr Harlow Mares   KNEE ARTHROSCOPY W/ MENISCAL REPAIR Right 2010   --Dr. Telford Nab   LAPAROSCOPIC ASSISTED VAGINAL HYSTERECTOMY N/A 01/15/2015   Procedure: LAPAROSCOPIC ASSISTED VAGINAL HYSTERECTOMY;  Surgeon: Nunzio Cobbs, MD;  Location: [facility];  Service: Gynecology;  Laterality: N/A;   left foot surgery  2004   left rotator cuff repair  01/2006  MASTECTOMY  09/11/2008   bilateral - Dr Margot Chimes   [facility] REMOVAL  09/09/2009   Dr Margot Chimes   [facility] PLACEMENT  05/17/2008   Dr Cori Razor rounds of chemo   REMOVAL OF TISSUE EXPANDER AND PLACEMENT OF IMPLANT  12/2008   RHINOPLASTY     SALPINGOOPHORECTOMY Bilateral 01/15/2015   Procedure: BILATERAL SALPINGO OOPHORECTOMY;  Surgeon: Nunzio Cobbs, MD;  Location: [facility];  Service: Gynecology;  Laterality: Bilateral;   UMBILICAL HERNIA REPAIR  09/09/2009   Dr Margot Chimes   UPPER GI ENDOSCOPY  03/2018   WISDOM TOOTH EXTRACTION     Social History   Social History Narrative   Not on file   Immunization History  Administered Date(s) Administered   Influenza,inj,Quad PF,6+ Mos 07/12/2019   PFIZER(Purple Top)SARS-COV-2 Vaccination 10/05/2019, 10/25/2019, 05/11/2020   Pneumococcal Polysaccharide-23 01/16/2015   Tdap 09/21/2010, 07/12/2019     Objective: Vital Signs: BP 118/79 (BP Location: Left Arm, Patient Position: Sitting, Cuff Size: Small)   Pulse 60   Resp 12   Ht 5\' 5"  (1.651 m)   Wt 161 lb 3.2 oz (73.1 kg)   LMP 11/12/2014   BMI 26.83 kg/m    Physical Exam Vitals and nursing note reviewed.  Constitutional:      Appearance: She is well-developed.  HENT:     Head: Normocephalic and atraumatic.  Eyes:     Conjunctiva/sclera: Conjunctivae normal.  Pulmonary:     Effort: Pulmonary effort is  normal.  Abdominal:     Palpations: Abdomen is soft.  Musculoskeletal:     Cervical back: Normal range of motion.  Skin:    General: Skin is warm and dry.     Capillary Refill: Capillary refill takes less than 2 seconds.  Neurological:     Mental Status: She is alert and oriented to person, place, and time.  Psychiatric:        Behavior: Behavior normal.     Musculoskeletal Exam: Spine, thoracic spine, lumbar spine have good range of motion with no discomfort.  Shoulder joints, elbow joints, wrist joints, MCPs, PIPs, DIPs have good range of motion with no synovitis.  Complete fist formation bilaterally.  Tenderness over the right third MCP joint noted.  Hip joints have good range of motion with some stiffness in the left hip.  Tenderness over the left trochanter bursa.  Knee joints have good range of motion with no warmth or effusion.  Ankle joints have good range of motion with no tenderness or joint swelling.  No tenderness over MTP joints.   CDAI Exam: CDAI Score: 1.2  Patient Global: 1 mm; Provider Global: 1 mm Swollen: 0 ; Tender: 1  Joint Exam 07/29/2021      Right  Left  MCP 3   Tender        Investigation: No additional findings.  Imaging: No results found.  Recent Labs: Lab Results  Component Value Date   WBC 6.1 07/28/2021   HGB 14.0 07/28/2021   PLT 188 07/28/2021   NA 142 07/28/2021   K 4.5 07/28/2021   CL 104 07/28/2021   CO2 33 (H) 07/28/2021   GLUCOSE 111 (H) 07/28/2021   BUN 15 07/28/2021   CREATININE 0.74 07/28/2021   BILITOT 0.7 07/28/2021   ALKPHOS 66 03/31/2017   AST 15 07/28/2021   ALT 13 07/28/2021   PROT 6.3 07/28/2021   ALBUMIN 4.1 03/31/2017   CALCIUM 9.9 07/28/2021   GFRAA 105 02/24/2021   QFTBGOLDPLUS NEGATIVE 11/27/2020    Speciality  CommentsTherisa Doyne Eye Exam: 09/30/2020  WNL @ [place] [facility] [phone] . Follow up in 1 year..  Procedures:  No procedures performed Allergies: Latex, Other, and  Percocet [oxycodone-acetaminophen]   Assessment / Plan:     Visit Diagnoses: Rheumatoid arthritis involving multiple sites with positive rheumatoid factor (HCC) - +RF, +CCP: She has no synovitis on examination today.  She has tenderness over the right third MCP joint but no inflammation was noted.  She was able to make a complete fist bilaterally.  She has no longer taking prednisone.  She continues to take Plaquenil 200 mg 1 tablet by mouth twice daily Monday through Friday.  She was started on Humira on 06/05/2021 and has had a total of 3 doses.  She has had an interruption in therapy due to testing positive for COVID-19 several weeks ago followed by being started on a prescription for acyclovir on 07/23/2021 by her dentist.  She was advised to complete the course of acyclovir prior to restarting Humira.  Referral to neurology was placed today for further evaluation of the left-sided lip and jaw numbness she has been experiencing for the past 2 weeks.  Discussed that it is unlikely that this is due to Humira since she has only had a total of 3 doses.  Of note she has no personal or family history of multiple sclerosis.  She will remain on Plaquenil as prescribed.  She was advised to notify us if she develops increased joint pain or joint swelling while off of Humira.  She will follow-up in the office in 2 months to assess her for response to Humira.  High risk medication use - Humira 40 mg subcutaneous injections every 14 days and Plaquenil 200 mg 1 tablet by mouth twice daily, Monday through Friday only.  She was started on Humira on 06/05/2021.  She has had a total of 3 doses of Humira due to holding it while having COVID-19 followed by taking a prescription for acyclovir for possible shingles.  She was advised to complete the course of acyclovir prior to resuming Humira. CBC and CMP were updated yesterday on 07/28/2021 and results were discussed with the patient today in the office.  Her next lab work will  be due in February and every 3 months.  Standing orders for CBC and CMP are in place.  TB gold negative on 11/27/20.  PLQ Eye Exam: 09/30/2020 WNL @ [place] [facility] [phone] . Follow up in 1 year.  Discussed the importance of holding Humira if she develops signs or symptoms of an infection and to resume once the infection is completely cleared.  She voiced understanding.  Primary osteoarthritis of both hands: She has PIP and DIP thickening consistent with osteoarthritis of both hands.  No inflammation was noted on examination today.  She was able to make a complete fist bilaterally.  Discussed the importance of joint protection and muscle strengthening.  Dupuytren's contracture of right hand - Unchanged.  Medial epicondylitis, right elbow: Resolved  Primary osteoarthritis of both knees: She has good range of motion of both knee joints with no discomfort.  No warmth or effusion was noted.  She remains active performing aerobic exercise several days per week.  Chondromalacia of left patella: Good range of motion of the left knee joint with no warmth or effusion.  No crepitus noted on examination today.  DDD (degenerative disc disease), cervical: She has good range of motion of the C-spine with no  discomfort.  No symptoms of radiculopathy.  Psoriasis: She has no active psoriasis at this time.  Osteopenia of multiple sites - DEXA updated on 04/24/2019: The BMD measured at Femur Neck Right is 0.823 g/cm2 with a T-score of -1.5.  Scheduled for updated DEXA on 11/20/21.   Lip numbness: Left side of lip-She presents today with left-sided lower lip numbness extending on the left side of her jaw which started 2 weeks ago.  She has been unable to identify a trigger of the symptoms.  She was evaluated by her dentist on 07/23/2021 and had x-rays at that time.  According to the patient her dentist saw small blisters along her gumline at that time She was prescribed a prescription  for acyclovir due to the concern for shingles.  She has not seen these blisters and has not noticed any other lesions or rashes.  She has not noticed any improvement in her symptoms after taking acyclovir for several days so she discontinued.  She has not noticed any difficulty swallowing, changes in taste, smell, vision, or hearing loss.  She denies numbness on any other areas of her body at this time.  She denies any lip or jaw pain.  She has not been experiencing any headaches or fevers.  She was advised to complete the course of acyclovir as prescribed by her dentist.  She was advised to continue to hold Humira while taking acyclovir. A referral to neurology was placed today for further evaluation and management.   Left jaw numbness: She has noticed numbness on her left lower lip extending to her jaw for the past 2 weeks.  She was evaluated by her dentist who obtained x-rays.  According to the patient her dentist found a few small blisters along her gumline concerning for shingles.  The patient was started on acyclovir which she took for several days but did not notice any improvement in her symptoms and discontinued.  She has not seen any blisters or lesions. According to the patient she was diagnosed with shingles on her right upper thigh in 2019 but does not feel that the symptoms are consistent with when she had shingles in the past. She has not had any difficulty chewing, swallowing, or changes in taste.  No drooling.  She has not had any fevers or headaches.  No lesions were apparent on examination today.  She is not experiencing any jaw pain or tooth pain at this time. She was advised to complete the course of acyclovir as prescribed by her dentist.  She will continue to hold Humira while taking acyclovir. Referral to neurology was placed today for further evaluation and management.  History of shingles: 2019-right upper thigh.   Other medical conditions are listed as follows:  History of  breast cancer - Bilateral mastectomy 2009.  No RTX.  No longer followed by oncology.   Heart murmur  Morton's neuroma of right foot  History of asthma  History of anemia  Orders: Orders Placed This Encounter  Procedures   Ambulatory referral to Neurology    No orders of the defined types were placed in this encounter.    Follow-Up Instructions: Return in about 5 months (around 12/27/2021) for Rheumatoid arthritis, Osteoarthritis.   Ofilia Neas, PA-C  Note - This record has been created using Dragon software.  Chart creation errors have been sought, but may not always  have been located. Such creation errors do not reflect on  the standard of medical care.

## 2021-07-17 ENCOUNTER — Telehealth: Payer: Self-pay

## 2021-07-17 NOTE — Telephone Encounter (Signed)
Patient called stating she received a call from [facility] to inform her that Dr. Estanislado Pandy denied her prescription refill of Folic Acid.  Patient requested a return call.

## 2021-07-17 NOTE — Telephone Encounter (Signed)
Patient advised prescription for Folic Acid was denied because she no longer needs to take it as she is not on MTX. Patient expressed understanding.

## 2021-07-28 ENCOUNTER — Other Ambulatory Visit: Payer: Self-pay | Admitting: *Deleted

## 2021-07-28 DIAGNOSIS — Z79899 Other long term (current) drug therapy: Secondary | ICD-10-CM

## 2021-07-29 ENCOUNTER — Encounter: Payer: Self-pay | Admitting: Physician Assistant

## 2021-07-29 ENCOUNTER — Ambulatory Visit: Payer: 59 | Admitting: Physician Assistant

## 2021-07-29 ENCOUNTER — Other Ambulatory Visit: Payer: Self-pay

## 2021-07-29 VITALS — BP 118/79 | HR 60 | Resp 12 | Ht 65.0 in | Wt 161.2 lb

## 2021-07-29 DIAGNOSIS — R200 Anesthesia of skin: Secondary | ICD-10-CM

## 2021-07-29 DIAGNOSIS — M720 Palmar fascial fibromatosis [Dupuytren]: Secondary | ICD-10-CM

## 2021-07-29 DIAGNOSIS — G5761 Lesion of plantar nerve, right lower limb: Secondary | ICD-10-CM

## 2021-07-29 DIAGNOSIS — M19041 Primary osteoarthritis, right hand: Secondary | ICD-10-CM | POA: Diagnosis not present

## 2021-07-29 DIAGNOSIS — M0579 Rheumatoid arthritis with rheumatoid factor of multiple sites without organ or systems involvement: Secondary | ICD-10-CM | POA: Diagnosis not present

## 2021-07-29 DIAGNOSIS — R011 Cardiac murmur, unspecified: Secondary | ICD-10-CM

## 2021-07-29 DIAGNOSIS — M2242 Chondromalacia patellae, left knee: Secondary | ICD-10-CM

## 2021-07-29 DIAGNOSIS — M19042 Primary osteoarthritis, left hand: Secondary | ICD-10-CM

## 2021-07-29 DIAGNOSIS — Z79899 Other long term (current) drug therapy: Secondary | ICD-10-CM | POA: Diagnosis not present

## 2021-07-29 DIAGNOSIS — M5030 Other cervical disc degeneration, unspecified cervical region: Secondary | ICD-10-CM

## 2021-07-29 DIAGNOSIS — M8589 Other specified disorders of bone density and structure, multiple sites: Secondary | ICD-10-CM

## 2021-07-29 DIAGNOSIS — L409 Psoriasis, unspecified: Secondary | ICD-10-CM

## 2021-07-29 DIAGNOSIS — M170 Bilateral primary osteoarthritis of knee: Secondary | ICD-10-CM

## 2021-07-29 DIAGNOSIS — Z8709 Personal history of other diseases of the respiratory system: Secondary | ICD-10-CM

## 2021-07-29 DIAGNOSIS — Z853 Personal history of malignant neoplasm of breast: Secondary | ICD-10-CM

## 2021-07-29 DIAGNOSIS — Z8619 Personal history of other infectious and parasitic diseases: Secondary | ICD-10-CM

## 2021-07-29 DIAGNOSIS — Z862 Personal history of diseases of the blood and blood-forming organs and certain disorders involving the immune mechanism: Secondary | ICD-10-CM

## 2021-07-29 DIAGNOSIS — M7701 Medial epicondylitis, right elbow: Secondary | ICD-10-CM

## 2021-07-29 LAB — COMPLETE METABOLIC PANEL WITH GFR
AG Ratio: 1.9 (calc) (ref 1.0–2.5)
ALT: 13 U/L (ref 6–29)
AST: 15 U/L (ref 10–35)
Albumin: 4.1 g/dL (ref 3.6–5.1)
Alkaline phosphatase (APISO): 48 U/L (ref 37–153)
BUN: 15 mg/dL (ref 7–25)
CO2: 33 mmol/L — ABNORMAL HIGH (ref 20–32)
Calcium: 9.9 mg/dL (ref 8.6–10.4)
Chloride: 104 mmol/L (ref 98–110)
Creat: 0.74 mg/dL (ref 0.50–1.03)
Globulin: 2.2 g/dL (calc) (ref 1.9–3.7)
Glucose, Bld: 111 mg/dL — ABNORMAL HIGH (ref 65–99)
Potassium: 4.5 mmol/L (ref 3.5–5.3)
Sodium: 142 mmol/L (ref 135–146)
Total Bilirubin: 0.7 mg/dL (ref 0.2–1.2)
Total Protein: 6.3 g/dL (ref 6.1–8.1)
eGFR: 94 mL/min/{1.73_m2} (ref >=60)

## 2021-07-29 LAB — CBC WITH DIFFERENTIAL/PLATELET
Absolute Monocytes: 409 cells/uL (ref 200–950)
Basophils Absolute: 31 cells/uL (ref 0–200)
Basophils Relative: 0.5 %
Eosinophils Absolute: 921 cells/uL — ABNORMAL HIGH (ref 15–500)
Eosinophils Relative: 15.1 %
HCT: 41.6 % (ref 35.0–45.0)
Hemoglobin: 14 g/dL (ref 11.7–15.5)
Lymphs Abs: 2037 cells/uL (ref 850–3900)
MCH: 32 pg (ref 27.0–33.0)
MCHC: 33.7 g/dL (ref 32.0–36.0)
MCV: 95 fL (ref 80.0–100.0)
MPV: 11.5 fL (ref 7.5–12.5)
Monocytes Relative: 6.7 %
Neutro Abs: 2702 cells/uL (ref 1500–7800)
Neutrophils Relative %: 44.3 %
Platelets: 188 10*3/uL (ref 140–400)
RBC: 4.38 10*6/uL (ref 3.80–5.10)
RDW: 12.3 % (ref 11.0–15.0)
Total Lymphocyte: 33.4 %
WBC: 6.1 10*3/uL (ref 3.8–10.8)

## 2021-07-29 NOTE — Patient Instructions (Signed)
Standing Labs We placed an order today for your standing lab work.   Please have your standing labs drawn in February and every 3 months   If possible, please have your labs drawn 2 weeks prior to your appointment so that the provider can discuss your results at your appointment.  Please note that you may see your imaging and lab results in MyChart before we have reviewed them. We may be awaiting multiple results to interpret others before contacting you. Please allow our office up to 72 hours to thoroughly review all of the results before contacting the office for clarification of your results.  We have open lab daily: Monday through Thursday from 1:30-4:30 PM and Friday from 1:30-4:00 PM at the office of Dr. Shaili Deveshwar, [facility].   Please be advised, all patients with office appointments requiring lab work will take precedent over walk-in lab work.  If possible, please come for your lab work on Monday and Friday afternoons, as you may experience shorter wait times. The office is located at [address], [address], [place], [place] [address] No appointment is necessary.   Labs are drawn by [facility]. Please bring your co-pay at the time of your lab draw.  You may receive a bill from [facility] for your lab work.  If you wish to have your labs drawn at another location, please call the office 24 hours in advance to send orders.  If you have any questions regarding directions or hours of operation,  please call [phone].   As a reminder, please drink plenty of water prior to coming for your lab work. Thanks!  

## 2021-07-30 ENCOUNTER — Encounter: Payer: Self-pay | Admitting: Neurology

## 2021-08-10 ENCOUNTER — Other Ambulatory Visit: Payer: Self-pay | Admitting: Physician Assistant

## 2021-08-10 DIAGNOSIS — M0579 Rheumatoid arthritis with rheumatoid factor of multiple sites without organ or systems involvement: Secondary | ICD-10-CM

## 2021-08-11 NOTE — Telephone Encounter (Signed)
Next Visit: 08/07/2021  Last Visit: 07/29/2021  Labs: 07/28/2021 Absolute eosinophils are elevated but trending down.  Rest of CBC WNL.  Glucose is 111.  Rest of CMP WNL.   Eye exam: 09/30/2020  WNL   Current Dose per office note 07/29/2021: Plaquenil 200 mg 1 tablet by mouth twice daily, Monday through Friday only.    TW:[id number] arthritis involving multiple sites with positive rheumatoid factor   Last Fill: 04/29/2021  Okay to refill Plaquenil?

## 2021-08-14 ENCOUNTER — Other Ambulatory Visit: Payer: Self-pay | Admitting: Physician Assistant

## 2021-08-14 DIAGNOSIS — M0579 Rheumatoid arthritis with rheumatoid factor of multiple sites without organ or systems involvement: Secondary | ICD-10-CM

## 2021-08-18 ENCOUNTER — Other Ambulatory Visit: Payer: Self-pay | Admitting: Physician Assistant

## 2021-08-18 NOTE — Telephone Encounter (Signed)
Next Visit: 08/07/2021   Last Visit: 07/29/2021  Current Dose per office note 07/29/2021: Humira 40 mg subcutaneous injections every 14 days  RF:[id number] arthritis involving multiple sites with positive rheumatoid factor   Labs: 07/28/2021 Absolute eosinophils are elevated but trending down.  Rest of CBC WNL.  Glucose is 111.  Rest of CMP WNL.   TB Gold: 11/27/2020 Neg   Last Fill: 06/05/2021

## 2021-09-01 ENCOUNTER — Other Ambulatory Visit: Payer: Self-pay | Admitting: Nurse Practitioner

## 2021-09-10 ENCOUNTER — Encounter: Payer: Self-pay | Admitting: Neurology

## 2021-09-10 ENCOUNTER — Other Ambulatory Visit: Payer: 59

## 2021-09-10 ENCOUNTER — Ambulatory Visit: Payer: 59 | Admitting: Neurology

## 2021-09-10 ENCOUNTER — Other Ambulatory Visit: Payer: Self-pay

## 2021-09-10 VITALS — BP 110/70 | HR 73 | Ht 65.0 in | Wt 161.0 lb

## 2021-09-10 DIAGNOSIS — R202 Paresthesia of skin: Secondary | ICD-10-CM | POA: Diagnosis not present

## 2021-09-10 LAB — C-REACTIVE PROTEIN: CRP: <1 mg/dL (ref 0.5–20.0)

## 2021-09-10 LAB — B12 AND FOLATE PANEL
Folate: >23.4 ng/mL (ref >5.9)
Vitamin B-12: 302 pg/mL (ref 211–911)

## 2021-09-10 LAB — SEDIMENTATION RATE: Sed Rate: 15 mm/hr (ref 0–30)

## 2021-09-10 NOTE — Progress Notes (Signed)
[facility] Clinic Note - Initial Visit   Date: 09/10/21  Rebecca Fleming MRN: [id number] DOB: 06-29-1966   Dear Hazel Sams, PA-C:  Thank you for your kind referral of Rebecca Fleming for consultation of facial paresthesias. Although her history is well known to you, please allow [place] to reiterate it for the purpose of our medical record. The patient was accompanied to the clinic by self.   History of Present Illness: Rebecca Fleming is a 55 y.o. right-handed female with RA, history of left breast cancer s/p mastectomy(2009), and GERD presenting for evaluation of left facial numbness.   Starting in October, she began having numbness involving the left lower lip and chin.  She saw her dentist who said she may have shingles and given acyclovir. Her symptoms have improved minimally, she continues to have constant numbness involving the left chin, lower lips, and gums.  She denies difficulty with swallowing/speech or facial weakness. She does not have any numbness/tingling of the right side of the face, arms or legs.  She also reports that her eyelashes fall out on the left eye.    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    Out-side paper records, electronic medical record, and images have been reviewed where available and summarized as:  Lab Results  Component Value Date   HGBA1C 5.3 04/11/2019   Lab Results  Component Value Date   VITAMINB12 326 07/05/2018   Lab Results  Component Value Date   TSH 0.44 06/05/2021   No results found for: ESRSEDRATE, POCTSEDRATE  Past Medical History:  Diagnosis Date   Abnormal uterine bleeding    Allergy    Anemia    Arthritis    Asthma    exercised induced asthma - rarely uses inhaler;none since Chemo.   Blood transfusion without reported diagnosis 1976   1976  following surgery at [facility]) 08/2008   left breast--has bilateral mastectomy   Complication of anesthesia    pt states" gets  shakes" with general   Cyst near tailbone    Elevated LDL cholesterol level    Environmental and seasonal allergies    Fibroid    Headache    sleeps , no meds   Heart murmur    never has caused any problems   Helicobacter pylori infection    Hemorrhoids    hx of   History of breast cancer    History of shingles    03/07/18   Hypotension    hx of   Post-operative nausea and vomiting    RA (rheumatoid arthritis) (HCC)    RA (rheumatoid arthritis) ([place])    Scoliosis    tx with robaxin prn - rarely uses   SVD (spontaneous vaginal delivery) 1996   x 1    Past Surgical History:  Procedure Laterality Date   ABDOMINAL HYSTERECTOMY     APPENDECTOMY     BREAST LUMPECTOMY  05/17/2008   x 2 re excise for margin - Dr Margot Chimes, bilateral mastectomy 08/2008   BREAST LUMPECTOMY W/ NEEDLE LOCALIZATION  04/23/2008   w [facility] Dr Margot Chimes   CESAREAN SECTION  01/01/2004   Dr Quincy Simmonds   COLONOSCOPY  03/2018   COLONOSCOPY  05/05/2019   ENDOMETRIAL BIOPSY  06/2013, 2015   x 2   EYE SURGERY  2003   bilateral lasik   FOOT SURGERY     HYSTEROSCOPY WITH D & C  07/21/2011   Procedure: DILATATION AND CURETTAGE (  D&C) [facility];  Surgeon: Arloa Koh;  Location: [facility];  Service: Gynecology;  Laterality: N/A;  with Ultrasound Guidance   INSERTION OF TISSUE EXPANDER AFTER MASTECTOMY  09/11/2008   Dr Harlow Mares   KNEE ARTHROSCOPY W/ MENISCAL REPAIR Right 2010   --Dr. Telford Nab   LAPAROSCOPIC ASSISTED VAGINAL HYSTERECTOMY N/A 01/15/2015   Procedure: LAPAROSCOPIC ASSISTED VAGINAL HYSTERECTOMY;  Surgeon: Nunzio Cobbs, MD;  Location: [facility];  Service: Gynecology;  Laterality: N/A;   left foot surgery  2004   left rotator cuff repair  01/2006   MASTECTOMY  09/11/2008   bilateral - Dr Margot Chimes   [facility] REMOVAL  09/09/2009   Dr Margot Chimes   [facility] PLACEMENT  05/17/2008   Dr Cori Razor rounds of chemo   REMOVAL OF TISSUE EXPANDER AND PLACEMENT OF IMPLANT  12/2008   RHINOPLASTY     SALPINGOOPHORECTOMY  Bilateral 01/15/2015   Procedure: BILATERAL SALPINGO OOPHORECTOMY;  Surgeon: Nunzio Cobbs, MD;  Location: [facility];  Service: Gynecology;  Laterality: Bilateral;   UMBILICAL HERNIA REPAIR  09/09/2009   Dr Margot Chimes   UPPER GI ENDOSCOPY  03/2018   WISDOM TOOTH EXTRACTION       Medications:  Outpatient Encounter Medications as of 09/10/2021  Medication Sig   calcium carbonate (OS-CAL) 600 MG TABS tablet Take 600 mg by mouth 2 (two) times daily with a meal.   cetirizine (ZYRTEC) 10 MG tablet Take 10 mg by mouth daily.   COLLAGEN PO Take by mouth daily.   GLUCOSAMINE PO Take 2 tablets by mouth daily.   HUMIRA PEN 40 MG/0.4ML PNKT INJECT 40MG SUBCUTANEOUSLY  EVERY OTHER WEEK   hydroxychloroquine (PLAQUENIL) 200 MG tablet TAKE 1 TABLET BY MOUTH TWICE A DAY MONDAY-FRIDAY   ibuprofen (ADVIL) 200 MG tablet Take 200 mg by mouth as needed.   Magnesium 500 MG TABS Take 1 tablet by mouth 2 (two) times daily.   Manganese 50 MG TABS Take 1 tablet by mouth daily.   methocarbamol (ROBAXIN) 500 MG tablet Take 1 tablet (500 mg total) by mouth every 8 (eight) hours as needed for muscle spasms.   pantoprazole (PROTONIX) 40 MG tablet Take 1 tablet (40 mg total) by mouth daily. MUST HAVE OFFICE VISIT FOR FURTHER REFILLS.   TURMERIC PO Take by mouth daily.   [DISCONTINUED] acyclovir (ZOVIRAX) 200 MG capsule Take 200 mg by mouth 5 (five) times daily. (Patient not taking: Reported on 09/10/2021)   [DISCONTINUED] folic acid (FOLVITE) 1 MG tablet TAKE 2 TABLETS BY MOUTH EVERY DAY (Patient not taking: Reported on 07/29/2021)   [DISCONTINUED] gabapentin (NEURONTIN) 100 MG capsule Take 100 mg by mouth as needed. (Patient not taking: Reported on 07/29/2021)   [DISCONTINUED] predniSONE (DELTASONE) 5 MG tablet Take 4 tabs po qd x 4 days, 3  tabs po qd x 4 days, 2  tabs po qd x 4 days, 1  tab po qd x 4 days (Patient not taking: Reported on 07/29/2021)   No facility-administered encounter medications on file as of  09/10/2021.    Allergies:  Allergies  Allergen Reactions   Latex Other (See Comments)    stuffiness   Other Swelling    Patient is allergic to Yellow Dye #5   Percocet [Oxycodone-Acetaminophen] Itching    Has had Vicodin- hydrocodone before without itching.    Family History: Family History  Problem Relation Age of Onset   Breast cancer Mother    Diabetes Mother    Hypertension Mother    Stroke Mother  Irritable bowel syndrome Mother    Kidney disease Mother    Heart failure Father    Hypertension Father    Colon cancer Neg Hx    Colon polyps Neg Hx    Esophageal cancer Neg Hx    Pancreatic cancer Neg Hx    Rectal cancer Neg Hx    Stomach cancer Neg Hx     Social History: Social History   Tobacco Use   Smoking status: Never   Smokeless tobacco: Never  Vaping Use   Vaping Use: Never used  Substance Use Topics   Alcohol use: Yes    Alcohol/week: 2.0 standard drinks    Types: 2 Cans of beer per week    Comment: occ glass of wine or beer   Drug use: No   Social History   Social History Narrative   Right Handed    Lives in a two story home. Main living on first floor.    Vital Signs:  LMP 11/12/2014   Neurological Exam: MENTAL STATUS including orientation to time, place, person, recent and remote memory, attention span and concentration, language, and fund of knowledge is normal.  Speech is not dysarthric.  CRANIAL NERVES: II:  No visual field defects.    III-IV-VI: Pupils equal round and reactive to light.  Normal conjugate, extra-ocular eye movements in all directions of gaze.  No nystagmus.  No ptosis.   V:  Temperature is reduced over the left chin and lower lip.  Sensation intact on the right side of the face.    VII:  Normal facial symmetry and movements.   VIII:  Normal hearing and vestibular function.   IX-X:  Normal palatal movement.   XI:  Normal shoulder shrug and head rotation.   XII:  Normal tongue strength and range of motion, no  deviation or fasciculation.  MOTOR:  Motor strength is 5/5 throughout. No atrophy, fasciculations or abnormal movements.  No pronator drift.   MSRs:  Right        Left                  brachioradialis 2+  2+  biceps 2+  2+  triceps 2+  2+  patellar 2+  2+  ankle jerk 2+  2+  Hoffman no  no  plantar response down  down   SENSORY:  Normal and symmetric perception of light touch, pinprick, vibration, and proprioception.  Romberg's sign absent.   COORDINATION/GAIT: Normal finger-to- nose-finger and heel-to-shin.  Intact rapid alternating movements bilaterally.  Able to rise from a chair without using arms.  Gait narrow based and stable. Tandem and stressed gait intact.    IMPRESSION: Left numb chin syndrome.  History is not suggestive of classic trigeminal neuralgia and is very localized to the left lower lip, gums, and chin.    - MRI trigeminal nerve wwo contrast. May need additional imaging of the face/mandible, if imaging is negative - Check ESR, CRP, SSA/B, SPEP with IFE, ACE, vitamin B12, folate  Further recommendations pending results.  Thank you for allowing me to participate in patient's care.  If I can answer any additional questions, I would be pleased to do so.    Sincerely,    Kasra Melvin K. Posey Pronto, DO

## 2021-09-17 LAB — PROTEIN ELECTROPHORESIS, SERUM
Albumin ELP: 3.9 g/dL (ref 3.8–4.8)
Alpha 1: 0.4 g/dL — ABNORMAL HIGH (ref 0.2–0.3)
Alpha 2: 0.6 g/dL (ref 0.5–0.9)
Beta 2: 0.4 g/dL (ref 0.2–0.5)
Beta Globulin: 0.5 g/dL (ref 0.4–0.6)
Gamma Globulin: 0.6 g/dL — ABNORMAL LOW (ref 0.8–1.7)
Total Protein: 6.4 g/dL (ref 6.1–8.1)

## 2021-09-17 LAB — IMMUNOFIXATION ELECTROPHORESIS
IgG (Immunoglobin G), Serum: 722 mg/dL (ref 600–1640)
IgM, Serum: 78 mg/dL (ref 50–300)
Immunofix Electr Int: NOT DETECTED
Immunoglobulin A: 433 mg/dL — ABNORMAL HIGH (ref 47–310)

## 2021-09-17 LAB — SJOGREN'S SYNDROME ANTIBODS(SSA + SSB)
SSA (Ro) (ENA) Antibody, IgG: <1 AI
SSB (La) (ENA) Antibody, IgG: <1 AI

## 2021-09-17 LAB — ANGIOTENSIN CONVERTING ENZYME: Angiotensin-Converting Enzyme: 94 U/L — ABNORMAL HIGH (ref 9–67)

## 2021-09-25 ENCOUNTER — Telehealth: Payer: Self-pay

## 2021-09-25 DIAGNOSIS — R7989 Other specified abnormal findings of blood chemistry: Secondary | ICD-10-CM

## 2021-09-25 DIAGNOSIS — R058 Other specified cough: Secondary | ICD-10-CM

## 2021-09-25 NOTE — Telephone Encounter (Signed)
Please let her know that her labs looked good, there was one that was mildly elevated (ACE) but this can also be elevated in autoimmune disease such as RA.  I will share the results with her rheumatologist to get their opinion as well.

## 2021-09-25 NOTE — Progress Notes (Signed)
Office Visit Note  Patient: Rebecca Fleming             Date of Birth: Jan 24, 1966           MRN: [id number]             PCP: Nunzio Cobbs, MD Referring: Aundria Rud* Visit Date: 10/07/2021 Occupation: @GUAROCC @  Subjective:  Medication management  History of Present Illness: Rebecca Fleming is a 56 y.o. female with a history of seropositive rheumatoid arthritis and osteoarthritis overlap.  She states she has been doing well on Orencia and Plaquenil combination.  She denies any joint pain or joint swelling.  She denies any morning stiffness.  She has had only 10 doses of Humira so far.  She was recently evaluated by Dr. Posey Pronto for jaw numbness.  The work-up is pending.  Activities of Daily Living:  Patient reports morning stiffness for 0 minutes.   Patient Reports nocturnal pain.  Difficulty dressing/grooming: Denies Difficulty climbing stairs: Denies Difficulty getting out of chair: Denies Difficulty using hands for taps, buttons, cutlery, and/or writing: Reports  Review of Systems  Constitutional:  Positive for fatigue.  HENT:  Positive for mouth dryness. Negative for mouth sores and nose dryness.   Eyes:  Negative for pain, itching and dryness.  Respiratory:  Positive for shortness of breath. Negative for difficulty breathing.   Cardiovascular:  Negative for palpitations.  Gastrointestinal:  Negative for blood in stool, constipation and diarrhea.  Endocrine: Negative for increased urination.  Genitourinary:  Negative for difficulty urinating.  Musculoskeletal:  Negative for joint pain, joint pain, joint swelling, myalgias, morning stiffness, muscle tenderness and myalgias.  Skin:  Negative for color change, rash and redness.  Allergic/Immunologic: Negative for susceptible to infections.  Neurological:  Positive for numbness and headaches. Negative for dizziness, memory loss and weakness.  Hematological:  Positive for bruising/bleeding tendency.   Psychiatric/Behavioral:  Negative for confusion.    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   Blood transfusion without reported diagnosis 1976   1976  following surgery at [facility]) 08/2008   left breast--has bilateral mastectomy   Complication of anesthesia    pt states" gets shakes" with general   Cyst near tailbone    Elevated LDL cholesterol level    Environmental and seasonal allergies    Fibroid    Headache    sleeps , no meds   Heart murmur    never has caused any problems   Helicobacter pylori infection    Hemorrhoids    hx of   History of breast cancer    History of shingles    03/07/18   Hypotension  hx of   Post-operative nausea and vomiting    RA (rheumatoid arthritis) (HCC)    RA (rheumatoid arthritis) (HCC)    Scoliosis    tx with robaxin prn - rarely uses   SVD (spontaneous vaginal delivery) 1996   x 1    Family History  Problem Relation Age of Onset   Breast cancer Mother    Diabetes Mother    Hypertension Mother    Stroke Mother    Irritable bowel syndrome Mother    Kidney disease Mother    Heart failure Father    Hypertension Father    Colon cancer Neg Hx    Colon polyps  Neg Hx    Esophageal cancer Neg Hx    Pancreatic cancer Neg Hx    Rectal cancer Neg Hx    Stomach cancer Neg Hx    Past Surgical History:  Procedure Laterality Date   ABDOMINAL HYSTERECTOMY     APPENDECTOMY     BREAST LUMPECTOMY  05/17/2008   x 2 re excise for margin - Dr Margot Chimes, bilateral mastectomy 08/2008   BREAST LUMPECTOMY W/ NEEDLE LOCALIZATION  04/23/2008   w [facility] Dr Margot Chimes   CESAREAN SECTION  01/01/2004   Dr Quincy Simmonds   COLONOSCOPY  03/2018   COLONOSCOPY  05/05/2019   ENDOMETRIAL BIOPSY  06/2013, 2015   x 2   EYE SURGERY  2003   bilateral lasik   FOOT SURGERY     HYSTEROSCOPY WITH D & C  07/21/2011   Procedure: DILATATION AND CURETTAGE (D&C) /HYSTEROSCOPY;  Surgeon: Arloa Koh;  Location: [facility];  Service: Gynecology;  Laterality: N/A;  with Ultrasound Guidance   INSERTION OF TISSUE EXPANDER AFTER MASTECTOMY  09/11/2008   Dr Harlow Mares   KNEE ARTHROSCOPY W/ MENISCAL REPAIR Right 2010   --Dr. Telford Nab   LAPAROSCOPIC ASSISTED VAGINAL HYSTERECTOMY N/A 01/15/2015   Procedure: LAPAROSCOPIC ASSISTED VAGINAL HYSTERECTOMY;  Surgeon: Nunzio Cobbs, MD;  Location: [facility];  Service: Gynecology;  Laterality: N/A;   left foot surgery  2004   left rotator cuff repair  01/2006   MASTECTOMY  09/11/2008   bilateral - Dr Margot Chimes   [facility] REMOVAL  09/09/2009   Dr Margot Chimes   [facility] PLACEMENT  05/17/2008   Dr Cori Razor rounds of chemo   REMOVAL OF TISSUE EXPANDER AND PLACEMENT OF IMPLANT  12/2008   RHINOPLASTY     SALPINGOOPHORECTOMY Bilateral 01/15/2015   Procedure: BILATERAL SALPINGO OOPHORECTOMY;  Surgeon: Nunzio Cobbs, MD;  Location: [facility];  Service: Gynecology;  Laterality: Bilateral;   UMBILICAL HERNIA REPAIR  09/09/2009   Dr Margot Chimes   UPPER GI ENDOSCOPY  03/2018   WISDOM TOOTH EXTRACTION     Social History   Social History Narrative   Right Handed    Lives in a two story home. Main living on first floor.   Immunization History  Administered Date(s)  Administered   Influenza,inj,Quad PF,6+ Mos 07/12/2019   PFIZER(Purple Top)SARS-COV-2 Vaccination 10/05/2019, 10/25/2019, 05/11/2020   Pneumococcal Polysaccharide-23 01/16/2015   Tdap 09/21/2010, 07/12/2019     Objective: Vital Signs: BP 130/75 (BP Location: Right Arm, Patient Position: Sitting, Cuff Size: Normal)    Pulse 69    Ht 5\' 5"  (1.651 m)    Wt 164 lb (74.4 kg)    LMP 11/12/2014    BMI 27.29 kg/m    Physical Exam Vitals and nursing note reviewed.  Constitutional:      Appearance: She is well-developed.  HENT:  Head: Normocephalic and atraumatic.  Eyes:     Conjunctiva/sclera: Conjunctivae normal.  Cardiovascular:     Rate and Rhythm: Normal rate and regular rhythm.     Heart sounds: Normal heart sounds.  Pulmonary:     Effort: Pulmonary effort is normal.     Breath sounds: Normal breath sounds.  Abdominal:     General: Bowel sounds are normal.     Palpations: Abdomen is soft.  Musculoskeletal:     Cervical back: Normal range of motion.  Lymphadenopathy:     Cervical: No cervical adenopathy.  Skin:    General: Skin is warm and dry.     Capillary Refill: Capillary refill takes less than 2 seconds.  Neurological:     Mental Status: She is alert and oriented to person, place, and time.  Psychiatric:        Behavior: Behavior normal.     Musculoskeletal Exam: C-spine thoracic and lumbar spine with good range of motion.  She has thoracolumbar scoliosis.  Shoulder joints, elbow joints, wrist joints, MCPs PIPs and DIPs with good range of motion with no synovitis.  Hip joints, knee joints, ankles, MTPs and PIPs with good range of motion with no synovitis.  CDAI Exam: CDAI Score: 0.2  Patient Global: 1 mm; Provider Global: 1 mm Swollen: 0 ; Tender: 0  Joint Exam 10/07/2021   No joint exam has been documented for this visit   There is currently no information documented on the homunculus. Go to the Rheumatology activity and complete the homunculus joint  exam.  Investigation: No additional findings.  Imaging: MR FACE/TRIGEMINAL WO/W CM  Result Date: 10/06/2021 CLINICAL DATA:  Prior history: Facial paresthesia. Left chin numbness. Additional history provided by scanning technologist: Patient reports numbness in mouth and left aspect of chin since October 2022. EXAM: MRI FACE TRIGEMINAL WITHOUT AND WITH CONTRAST TECHNIQUE: Multiplanar, multi-echo pulse sequences of the face and surrounding structures, including thin-slice imaging of the trigeminal nerves, were acquired before and after intravenous contrast administration. CONTRAST:  51mL MULTIHANCE GADOBENATE DIMEGLUMINE 529 MG/ML IV SOLN COMPARISON:  MR cervical spine 01/09/2020. FINDINGS: Limited intracranial/Trigeminal nerves: No visible intracranial mass, hydrocephalus or extra-axial collection. Mild multifocal T2 FLAIR hyperintense signal abnormality within the cerebral white matter at the imaged levels, nonspecific. No mass or pathologic enhancement is identified along the course of the trigeminal nerves on thin-slice imaging. Vascular: Maintained flow voids within the proximal large arterial vessels. Sinuses/Orbits: No evidence of an orbital mass or orbital inflammation. Mild mucosal thickening within the bilateral ethmoid and right maxillary sinuses. Soft tissues: The visualized maxillofacial and upper neck soft tissues are unremarkable. Osseous: No focal suspicious marrow lesion. Incompletely assessed cervical spondylosis. Reversal of the expected cervical lordosis. Mild C4-C5 grade 1 anterolisthesis. IMPRESSION: No mass or pathologic enhancement is identified along the course of the left trigeminal nerve to account for the patient's symptoms. Mild multifocal T2 FLAIR hyperintense signal abnormality within the cerebral white matter at the imaged levels. These signal changes are nonspecific and differential considerations include chronic small vessel ischemic disease, sequela of chronic migraine  headaches, sequela of a prior infectious/inflammatory process or sequela of demyelinating process (such as multiple sclerosis), among others. A dedicated brain MRI may be obtained for further characterization, as clinically warranted. Mild mucosal thickening within the bilateral ethmoid and right maxillary sinuses. Incompletely assessed cervical spondylosis. Reversal of the expected cervical lordosis. Mild C4-C5 grade 1 anterolisthesis. Electronically Signed   By: Kellie Simmering D.O.   On: 10/06/2021 09:24  Recent Labs: Lab Results  Component Value Date   WBC 6.1 07/28/2021   HGB 14.0 07/28/2021   PLT 188 07/28/2021   NA 142 07/28/2021   K 4.5 07/28/2021   CL 104 07/28/2021   CO2 33 (H) 07/28/2021   GLUCOSE 111 (H) 07/28/2021   BUN 15 07/28/2021   CREATININE 0.74 07/28/2021   BILITOT 0.7 07/28/2021   ALKPHOS 66 03/31/2017   AST 15 07/28/2021   ALT 13 07/28/2021   PROT 6.4 09/10/2021   ALBUMIN 4.1 03/31/2017   CALCIUM 9.9 07/28/2021   GFRAA 105 02/24/2021   QFTBGOLDPLUS NEGATIVE 11/27/2020    Speciality Comments: PLQ Eye Exam: 09/30/2020  WNL @ [place] [phone] . Follow up in 1 year. Humira 09/22  Procedures:  No procedures performed Allergies: Latex, Other, and Percocet [oxycodone-acetaminophen]   Assessment / Plan:     Visit Diagnoses: Rheumatoid arthritis involving multiple sites with positive rheumatoid factor (HCC) - +RF, +CCP: She had no synovitis on examination.  She is doing well on the combination of Humira and hydroxychloroquine.  She is tolerating medications well.  High risk medication use - Humira 40 mg subcutaneous injections every 14 days and Plaquenil 200 mg 1 tablet by mouth twice daily, Monday through Friday only.PLQ Eye Exam: 09/30/2020 -labs from July 28, 2021 were reviewed which were within normal limits.  TB gold was negative on November 27, 2020.  We will repeat labs in February along with TB Gold.  Plan:  QuantiFERON-TB Gold Plus  Primary osteoarthritis of both hands-she has DIP and PIP thickening.  No synovitis was noted.  Dupuytren's contracture of right hand - Unchanged.  Primary osteoarthritis of both knees-she denies any discomfort today.  Chondromalacia of left patella  DDD (degenerative disc disease), cervical-she has disc disease of the cervical spine.  Psoriasis-no active psoriasis lesions were noted.  Osteopenia of multiple sites - DEXA updated on 04/24/2019: The BMD measured at Femur Neck Right is 0.823 g/cm2 with a T-score of -1.5.  Use of calcium rich diet and vitamin D was discussed.  History of breast cancer - Bilateral mastectomy 2009.  No RTX.  No longer followed by oncology.   Heart murmur  History of anemia  History of asthma  Lip numbness -she is seeing Dr. Posey Pronto.  Work-up is pending.  Left jaw numbness -getting evaluation by Dr. Posey Pronto.  Orders: Orders Placed This Encounter  Procedures   QuantiFERON-TB Gold Plus   No orders of the defined types were placed in this encounter.    Follow-Up Instructions: Return in about 5 months (around 03/07/2022) for Rheumatoid arthritis, Osteoarthritis.   Bo Merino, MD  Note - This record has been created using Editor, commissioning.  Chart creation errors have been sought, but may not always  have been located. Such creation errors do not reflect on  the standard of medical care.

## 2021-09-25 NOTE — Telephone Encounter (Signed)
ACE level can be nonspecific although is typically seen in patients with sarcoidosis.  The diagnosis of sarcoidosis can be established by obtaining a tissue biopsy.  Her chest x-ray was normal in 2017.  If the suspicion is high then you may repeat chest x-ray to look for hilar lymphadenopathy.  I hope this helps. Abel Presto

## 2021-09-25 NOTE — Telephone Encounter (Signed)
Rebecca Fleming, please let pt know that we will go ahead and check chest x-ray to further investigate her elevated ACE and wait to what her MRI face shows. Thanks.

## 2021-09-25 NOTE — Telephone Encounter (Signed)
Called patient and asked if she will be proceeding with her Trigeminal MRI? Patient stated she will give [facility] a call this morning to schedule. She stated she missed a call from [facility] [place] and planned to call them back today. Patient also wanted to know her lab results?

## 2021-09-26 NOTE — Addendum Note (Signed)
Addended by: Alda Berthold on: 09/26/2021 10:02 AM   Modules accepted: Orders

## 2021-09-26 NOTE — Addendum Note (Signed)
Addended by: Armen Pickup A on: 09/26/2021 08:16 AM   Modules accepted: Orders

## 2021-09-26 NOTE — Telephone Encounter (Signed)
I called patient and informed her of lab results and recommendations from Dr. Posey Pronto and Dr. Estanislado Pandy. Patient is ok with moving forward with her chest xray and is aware I will send the order to [facility].

## 2021-10-05 ENCOUNTER — Ambulatory Visit
Admission: RE | Admit: 2021-10-05 | Discharge: 2021-10-05 | Disposition: A | Discharge status: Home / Self Care | Payer: 59 | Source: Ambulatory Visit | Attending: Neurology | Admitting: Neurology

## 2021-10-05 ENCOUNTER — Other Ambulatory Visit: Payer: Self-pay

## 2021-10-05 DIAGNOSIS — R202 Paresthesia of skin: Secondary | ICD-10-CM

## 2021-10-05 MED ORDER — GADOBENATE DIMEGLUMINE 529 MG/ML IV SOLN
15.0000 mL | Freq: Once | INTRAVENOUS | Status: AC | PRN
  Administered 2021-10-05: 15 mL via INTRAVENOUS

## 2021-10-07 ENCOUNTER — Ambulatory Visit: Payer: 59 | Admitting: Rheumatology

## 2021-10-07 ENCOUNTER — Encounter: Payer: Self-pay | Admitting: Rheumatology

## 2021-10-07 ENCOUNTER — Other Ambulatory Visit: Payer: Self-pay

## 2021-10-07 ENCOUNTER — Telehealth: Payer: Self-pay | Admitting: Neurology

## 2021-10-07 VITALS — BP 130/75 | HR 69 | Ht 65.0 in | Wt 164.0 lb

## 2021-10-07 DIAGNOSIS — M0579 Rheumatoid arthritis with rheumatoid factor of multiple sites without organ or systems involvement: Secondary | ICD-10-CM | POA: Diagnosis not present

## 2021-10-07 DIAGNOSIS — M19041 Primary osteoarthritis, right hand: Secondary | ICD-10-CM

## 2021-10-07 DIAGNOSIS — Z853 Personal history of malignant neoplasm of breast: Secondary | ICD-10-CM

## 2021-10-07 DIAGNOSIS — Z79899 Other long term (current) drug therapy: Secondary | ICD-10-CM | POA: Diagnosis not present

## 2021-10-07 DIAGNOSIS — M720 Palmar fascial fibromatosis [Dupuytren]: Secondary | ICD-10-CM | POA: Diagnosis not present

## 2021-10-07 DIAGNOSIS — Z862 Personal history of diseases of the blood and blood-forming organs and certain disorders involving the immune mechanism: Secondary | ICD-10-CM

## 2021-10-07 DIAGNOSIS — M8589 Other specified disorders of bone density and structure, multiple sites: Secondary | ICD-10-CM

## 2021-10-07 DIAGNOSIS — L409 Psoriasis, unspecified: Secondary | ICD-10-CM

## 2021-10-07 DIAGNOSIS — R011 Cardiac murmur, unspecified: Secondary | ICD-10-CM

## 2021-10-07 DIAGNOSIS — R200 Anesthesia of skin: Secondary | ICD-10-CM

## 2021-10-07 DIAGNOSIS — M19042 Primary osteoarthritis, left hand: Secondary | ICD-10-CM

## 2021-10-07 DIAGNOSIS — M5030 Other cervical disc degeneration, unspecified cervical region: Secondary | ICD-10-CM

## 2021-10-07 DIAGNOSIS — M2242 Chondromalacia patellae, left knee: Secondary | ICD-10-CM

## 2021-10-07 DIAGNOSIS — M170 Bilateral primary osteoarthritis of knee: Secondary | ICD-10-CM

## 2021-10-07 DIAGNOSIS — Z8619 Personal history of other infectious and parasitic diseases: Secondary | ICD-10-CM

## 2021-10-07 DIAGNOSIS — Z8709 Personal history of other diseases of the respiratory system: Secondary | ICD-10-CM

## 2021-10-07 DIAGNOSIS — G5761 Lesion of plantar nerve, right lower limb: Secondary | ICD-10-CM

## 2021-10-07 DIAGNOSIS — M7701 Medial epicondylitis, right elbow: Secondary | ICD-10-CM

## 2021-10-07 NOTE — Patient Instructions (Addendum)
Standing Labs We placed an order today for your standing lab work.   Please have your standing labs drawn in February and every 3 months  TB Gold with next labs  If possible, please have your labs drawn 2 weeks prior to your appointment so that the provider can discuss your results at your appointment.  Please note that you may see your imaging and lab results in [place] before we have reviewed them. We may be awaiting multiple results to interpret others before contacting you. Please allow our office up to 72 hours to thoroughly review all of the results before contacting the office for clarification of your results.  We have open lab daily: Monday through Thursday from 1:30-4:30 PM and Friday from 1:30-4:00 PM at the office of Dr. Bo Merino, [facility].   Please be advised, all patients with office appointments requiring lab work will take precedent over walk-in lab work.  If possible, please come for your lab work on Monday and Friday afternoons, as you may experience shorter wait times. The office is located at [address], [place], [place], [place] [address] No appointment is necessary.   Labs are drawn by [facility]. Please bring your co-pay at the time of your lab draw.  You may receive a bill from [facility] for your lab work.  Please note if you are on Hydroxychloroquine and and an order has been placed for a Hydroxychloroquine level, you will need to have it drawn 4 hours or more after your last dose.  If you wish to have your labs drawn at another location, please call the office 24 hours in advance to send orders.  If you have any questions regarding directions or hours of operation,  please call [phone].   As a reminder, please drink plenty of water prior to coming for your lab work. Thanks!  Vaccines You are taking a medication(s) that can suppress your immune system.  The following immunizations are recommended: Flu annually Covid-19  Td/Tdap  (tetanus, diphtheria, pertussis) every 10 years Pneumonia (Prevnar 15 then Pneumovax 23 at least 1 year apart.  Alternatively, can take Prevnar 20 without needing additional dose) Shingrix: 2 doses from 4 weeks to 6 months apart  Please check with your PCP to make sure you are up to date.   If you have signs or symptoms of an infection or start antibiotics: First, call your PCP for workup of your infection. Hold your medication through the infection, until you complete your antibiotics, and until symptoms resolve if you take the following: Injectable medication (Actemra, Benlysta, Cimzia, Cosentyx, Enbrel, Humira, Kevzara, Orencia, Remicade, Simponi, Stelara, Taltz, Tremfya) Methotrexate Leflunomide (Arava) Mycophenolate (Cellcept) Morrie Sheldon, Olumiant, or Rinvoq

## 2021-10-07 NOTE — Telephone Encounter (Signed)
Called patient and discussed the results of MRI face, which does not show any abnormalities of the trigeminal nerve throughout the course. Imaging was personally reviewed with radiology.    There were nonspecific white matter changes in the brain, however, this study was limited evaluation of the brain. To further assess these changes, MRI brain wwo contrast can be ordered. She would like to think about this and let me know.  To further evaluate her elevated ACE, she will be going for CXR later today.   Feven Alderfer K. Posey Pronto, DO

## 2021-10-08 ENCOUNTER — Telehealth: Payer: Self-pay | Admitting: Neurology

## 2021-10-08 DIAGNOSIS — R202 Paresthesia of skin: Secondary | ICD-10-CM

## 2021-10-08 DIAGNOSIS — R9082 White matter disease, unspecified: Secondary | ICD-10-CM

## 2021-10-08 NOTE — Telephone Encounter (Signed)
Order for MRI has been sent to [facility]. Called patient and informed her.

## 2021-10-08 NOTE — Telephone Encounter (Signed)
Patient left a message stating she wants to proceed with a brain MRI after giving it some thought.

## 2021-10-09 ENCOUNTER — Ambulatory Visit
Admission: RE | Admit: 2021-10-09 | Discharge: 2021-10-09 | Disposition: A | Discharge status: Home / Self Care | Payer: 59 | Source: Ambulatory Visit | Attending: Neurology | Admitting: Neurology

## 2021-10-09 DIAGNOSIS — R058 Other specified cough: Secondary | ICD-10-CM

## 2021-10-09 DIAGNOSIS — R7989 Other specified abnormal findings of blood chemistry: Secondary | ICD-10-CM

## 2021-10-13 ENCOUNTER — Ambulatory Visit: Payer: 59 | Admitting: Neurology

## 2021-10-24 ENCOUNTER — Ambulatory Visit
Admission: RE | Admit: 2021-10-24 | Discharge: 2021-10-24 | Disposition: A | Discharge status: Home / Self Care | Payer: 59 | Source: Ambulatory Visit | Attending: Neurology | Admitting: Neurology

## 2021-10-24 DIAGNOSIS — R9082 White matter disease, unspecified: Secondary | ICD-10-CM

## 2021-10-24 DIAGNOSIS — R202 Paresthesia of skin: Secondary | ICD-10-CM

## 2021-10-24 MED ORDER — GADOBENATE DIMEGLUMINE 529 MG/ML IV SOLN
15.0000 mL | Freq: Once | INTRAVENOUS | Status: AC | PRN
  Administered 2021-10-24: 15 mL via INTRAVENOUS

## 2021-10-29 ENCOUNTER — Telehealth: Payer: Self-pay | Admitting: Neurology

## 2021-10-29 NOTE — Telephone Encounter (Signed)
I have contacted patient about her MRI results. Please see result notes.

## 2021-10-29 NOTE — Telephone Encounter (Signed)
Patient is returning a call to [facility]

## 2021-11-05 ENCOUNTER — Other Ambulatory Visit: Payer: Self-pay | Admitting: Physician Assistant

## 2021-11-05 DIAGNOSIS — M0579 Rheumatoid arthritis with rheumatoid factor of multiple sites without organ or systems involvement: Secondary | ICD-10-CM

## 2021-11-05 NOTE — Telephone Encounter (Signed)
Next Visit: 03/11/2022  Last Visit: 10/07/2021  Last Fill: 08/18/2021  SV:EXOGACGBKO arthritis involving multiple sites with positive rheumatoid factor   Current Dose per office note 10/07/2021: Humira 40 mg subcutaneous injections every 14 days  Labs: 07/28/2021 Absolute eosinophils are elevated but trending down.  Rest of CBC WNL.  Glucose is 111.  Rest of CMP WNL  TB Gold: 11/27/2020 Neg    Left message to advise patient she is due to update labs.   Okay to refill Humira?

## 2021-11-10 ENCOUNTER — Other Ambulatory Visit: Payer: Self-pay | Admitting: *Deleted

## 2021-11-10 DIAGNOSIS — Z79899 Other long term (current) drug therapy: Secondary | ICD-10-CM

## 2021-11-11 ENCOUNTER — Other Ambulatory Visit: Payer: Self-pay | Admitting: *Deleted

## 2021-11-11 DIAGNOSIS — M0579 Rheumatoid arthritis with rheumatoid factor of multiple sites without organ or systems involvement: Secondary | ICD-10-CM

## 2021-11-11 MED ORDER — HYDROXYCHLOROQUINE SULFATE 200 MG PO TABS: ORAL_TABLET | ORAL | 0 refills | Status: DC

## 2021-11-11 NOTE — Telephone Encounter (Signed)
Patient requesting refill on PLQ.  Next Visit: 03/11/2022  Last Visit: 10/07/2021  Labs: 11/10/2021 CMP WNL.  Eosinophils remain elevated but are improving.  Rest of CBC WNL.   Eye exam: 10/08/2021 WNL   Current Dose per office note 10/07/2021: Plaquenil 200 mg 1 tablet by mouth twice daily, Monday through Friday only  DX: Rheumatoid arthritis involving multiple sites with positive rheumatoid factor   Last Fill: 08/11/2021  Okay to refill Plaquenil?

## 2021-11-13 LAB — CBC WITH DIFFERENTIAL/PLATELET
Absolute Monocytes: 359 cells/uL (ref 200–950)
Basophils Absolute: 29 cells/uL (ref 0–200)
Basophils Relative: 0.5 %
Eosinophils Absolute: 866 cells/uL — ABNORMAL HIGH (ref 15–500)
Eosinophils Relative: 15.2 %
HCT: 42.5 % (ref 35.0–45.0)
Hemoglobin: 14.3 g/dL (ref 11.7–15.5)
Lymphs Abs: 2001 cells/uL (ref 850–3900)
MCH: 31.6 pg (ref 27.0–33.0)
MCHC: 33.6 g/dL (ref 32.0–36.0)
MCV: 94 fL (ref 80.0–100.0)
MPV: 11.7 fL (ref 7.5–12.5)
Monocytes Relative: 6.3 %
Neutro Abs: 2445 cells/uL (ref 1500–7800)
Neutrophils Relative %: 42.9 %
Platelets: 199 10*3/uL (ref 140–400)
RBC: 4.52 10*6/uL (ref 3.80–5.10)
RDW: 12.2 % (ref 11.0–15.0)
Total Lymphocyte: 35.1 %
WBC: 5.7 10*3/uL (ref 3.8–10.8)

## 2021-11-13 LAB — QUANTIFERON-TB GOLD PLUS
Mitogen-NIL: >10 IU/mL
NIL: 0.07 IU/mL
QuantiFERON-TB Gold Plus: NEGATIVE
TB1-NIL: <0 IU/mL
TB2-NIL: 0 IU/mL

## 2021-11-13 LAB — COMPLETE METABOLIC PANEL WITH GFR
AG Ratio: 1.6 (calc) (ref 1.0–2.5)
ALT: 13 U/L (ref 6–29)
AST: 15 U/L (ref 10–35)
Albumin: 4 g/dL (ref 3.6–5.1)
Alkaline phosphatase (APISO): 54 U/L (ref 37–153)
BUN: 13 mg/dL (ref 7–25)
CO2: 32 mmol/L (ref 20–32)
Calcium: 9.9 mg/dL (ref 8.6–10.4)
Chloride: 104 mmol/L (ref 98–110)
Creat: 0.72 mg/dL (ref 0.50–1.03)
Globulin: 2.5 g/dL (calc) (ref 1.9–3.7)
Glucose, Bld: 87 mg/dL (ref 65–99)
Potassium: 4.7 mmol/L (ref 3.5–5.3)
Sodium: 142 mmol/L (ref 135–146)
Total Bilirubin: 0.7 mg/dL (ref 0.2–1.2)
Total Protein: 6.5 g/dL (ref 6.1–8.1)
eGFR: 97 mL/min/{1.73_m2} (ref >=60)

## 2021-11-13 NOTE — Progress Notes (Signed)
TB Gold is negative.

## 2021-11-20 ENCOUNTER — Other Ambulatory Visit: Payer: Self-pay

## 2021-11-20 ENCOUNTER — Ambulatory Visit
Admission: RE | Admit: 2021-11-20 | Discharge: 2021-11-20 | Disposition: A | Discharge status: Home / Self Care | Payer: 59 | Source: Ambulatory Visit | Attending: Obstetrics and Gynecology | Admitting: Obstetrics and Gynecology

## 2021-11-20 DIAGNOSIS — Z01419 Encounter for gynecological examination (general) (routine) without abnormal findings: Secondary | ICD-10-CM

## 2021-11-26 ENCOUNTER — Other Ambulatory Visit: Payer: Self-pay | Admitting: Physician Assistant

## 2021-11-26 DIAGNOSIS — M0579 Rheumatoid arthritis with rheumatoid factor of multiple sites without organ or systems involvement: Secondary | ICD-10-CM

## 2021-11-26 NOTE — Telephone Encounter (Signed)
Next Visit: 03/11/2022 ?  ?Last Visit: 10/07/2021 ?  ?Labs: 11/10/2021 CMP WNL.  Eosinophils remain elevated but are improving.  Rest of CBC WNL.  ? ?TB Gold: 11/10/2021 Neg  ? ?Current Dose per office note 10/07/2021: Humira 40 mg subcutaneous injections every 14 days  ? ?DX: Rheumatoid arthritis involving multiple sites with positive rheumatoid factor  ?  ?Last Fill: 11/05/2021 (30 day supply) ? ?Okay to refill Humira?  ?

## 2021-11-30 ENCOUNTER — Other Ambulatory Visit: Payer: Self-pay | Admitting: Nurse Practitioner

## 2021-12-24 ENCOUNTER — Ambulatory Visit: Payer: 59 | Admitting: Neurology

## 2022-01-13 ENCOUNTER — Other Ambulatory Visit: Payer: Self-pay | Admitting: Physician Assistant

## 2022-01-13 DIAGNOSIS — M0579 Rheumatoid arthritis with rheumatoid factor of multiple sites without organ or systems involvement: Secondary | ICD-10-CM

## 2022-01-13 NOTE — Telephone Encounter (Signed)
Next Visit: 03/11/2022 ?  ?Last Visit: 10/07/2021 ?  ?Labs: 11/10/2021 CMP WNL.  Eosinophils remain elevated but are improving.  Rest of CBC WNL.  ?  ?Eye exam: 10/08/2021 WNL  ?  ?Current Dose per office note 10/07/2021: Plaquenil 200 mg 1 tablet by mouth twice daily, Monday through Friday only ?  ?DX: Rheumatoid arthritis involving multiple sites with positive rheumatoid factor  ?  ?Last Fill: 11/11/2021 ?  ?Okay to refill Plaquenil?  ?

## 2022-02-25 NOTE — Progress Notes (Deleted)
Office Visit Note  Patient: Rebecca Fleming             Date of Birth: 12-Dec-1965           MRN: [id number]             PCP: Nunzio Cobbs, MD Referring: Aundria Rud* Visit Date: 03/11/2022 Occupation: '@GUAROCC'$ @  Subjective:  No chief complaint on file.   History of Present Illness: Rebecca Fleming is a 55 y.o. female ***   Activities of Daily Living:  Patient reports morning stiffness for *** {minute/hour:19697}.   Patient {ACTIONS;DENIES/REPORTS:21021675::"Denies"} nocturnal pain.  Difficulty dressing/grooming: {ACTIONS;DENIES/REPORTS:21021675::"Denies"} Difficulty climbing stairs: {ACTIONS;DENIES/REPORTS:21021675::"Denies"} Difficulty getting out of chair: {ACTIONS;DENIES/REPORTS:21021675::"Denies"} Difficulty using hands for taps, buttons, cutlery, and/or writing: {ACTIONS;DENIES/REPORTS:21021675::"Denies"}  No Rheumatology ROS completed.   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   Blood transfusion without reported diagnosis 1976   1976  following surgery at [facility]) 08/2008   left breast--has bilateral mastectomy   Complication of anesthesia    pt states" gets shakes" with  general   Cyst near tailbone    Elevated LDL cholesterol level    Environmental and seasonal allergies    Fibroid    Headache    sleeps , no meds   Heart murmur    never has caused any problems   Helicobacter pylori infection    Hemorrhoids    hx of   History of breast cancer    History of shingles    03/07/18   Hypotension    hx of   Post-operative nausea and vomiting    RA (rheumatoid arthritis) ([facility])    RA (rheumatoid arthritis) ([place])    Scoliosis    tx with robaxin prn - rarely uses   SVD (spontaneous vaginal delivery) 1996   x 1    Family History  Problem Relation Age of Onset   Breast cancer Mother    Diabetes Mother    Hypertension Mother    Stroke Mother    Irritable bowel syndrome Mother    Kidney disease Mother    Heart failure Father    Hypertension Father    Colon cancer Neg Hx    Colon polyps Neg Hx    Esophageal cancer Neg Hx    Pancreatic cancer Neg Hx    Rectal cancer Neg Hx    Stomach cancer Neg Hx    Past Surgical History:  Procedure Laterality Date   ABDOMINAL HYSTERECTOMY     APPENDECTOMY     BREAST LUMPECTOMY  05/17/2008   x 2 re excise for margin - Dr Margot Chimes, bilateral mastectomy 08/2008   BREAST LUMPECTOMY W/ NEEDLE LOCALIZATION  04/23/2008  w [facility] Dr Margot Chimes   CESAREAN SECTION  01/01/2004   Dr Quincy Simmonds   COLONOSCOPY  03/2018   COLONOSCOPY  05/05/2019   ENDOMETRIAL BIOPSY  06/2013, 2015   x 2   EYE SURGERY  2003   bilateral lasik   FOOT SURGERY     HYSTEROSCOPY WITH D & C  07/21/2011   Procedure: DILATATION AND CURETTAGE (D&C) /HYSTEROSCOPY;  Surgeon: Arloa Koh;  Location: [facility];  Service: Gynecology;  Laterality: N/A;  with Ultrasound Guidance   INSERTION OF TISSUE EXPANDER AFTER MASTECTOMY  09/11/2008   Dr Harlow Mares   KNEE ARTHROSCOPY W/ MENISCAL REPAIR Right 2010   --Dr. Telford Nab   LAPAROSCOPIC ASSISTED VAGINAL HYSTERECTOMY N/A 01/15/2015   Procedure: LAPAROSCOPIC ASSISTED VAGINAL HYSTERECTOMY;  Surgeon: Nunzio Cobbs,  MD;  Location: [facility];  Service: Gynecology;  Laterality: N/A;   left foot surgery  2004   left rotator cuff repair  01/2006   MASTECTOMY  09/11/2008   bilateral - Dr Margot Chimes   [facility] REMOVAL  09/09/2009   Dr Margot Chimes   [facility] PLACEMENT  05/17/2008   Dr Cori Razor rounds of chemo   REMOVAL OF TISSUE EXPANDER AND PLACEMENT OF IMPLANT  12/2008   RHINOPLASTY     SALPINGOOPHORECTOMY Bilateral 01/15/2015   Procedure: BILATERAL SALPINGO OOPHORECTOMY;  Surgeon: Nunzio Cobbs, MD;  Location: [facility];  Service: Gynecology;  Laterality: Bilateral;   UMBILICAL HERNIA REPAIR  09/09/2009   Dr Margot Chimes   UPPER GI ENDOSCOPY  03/2018   WISDOM TOOTH EXTRACTION     Social History   Social History Narrative   Right Handed    Lives in a two story home. Main living on first floor.   Immunization History  Administered Date(s) Administered   Influenza,inj,Quad PF,6+ Mos 07/12/2019   PFIZER(Purple Top)SARS-COV-2 Vaccination 10/05/2019, 10/25/2019, 05/11/2020   Pneumococcal Polysaccharide-23 01/16/2015   Tdap 09/21/2010, 07/12/2019     Objective: Vital Signs: LMP 11/12/2014    Physical Exam   Musculoskeletal Exam: ***  CDAI Exam: CDAI Score: -- Patient Global: --; Provider Global: -- Swollen: --; Tender: -- Joint Exam 03/11/2022   No joint exam has been documented for this visit   There is currently no information documented on the homunculus. Go to the Rheumatology activity and complete the homunculus joint exam.  Investigation: No additional findings.  Imaging: No results found.  Recent Labs: Lab Results  Component Value Date   WBC 5.7 11/10/2021   HGB 14.3 11/10/2021   PLT 199 11/10/2021   NA 142 11/10/2021   K 4.7 11/10/2021   CL 104 11/10/2021   CO2 32 11/10/2021   GLUCOSE 87 11/10/2021   BUN 13 11/10/2021   CREATININE 0.72 11/10/2021   BILITOT 0.7 11/10/2021   ALKPHOS 66 03/31/2017   AST 15 11/10/2021   ALT 13 11/10/2021   PROT 6.5 11/10/2021   ALBUMIN  4.1 03/31/2017   CALCIUM 9.9 11/10/2021   GFRAA 105 02/24/2021   QFTBGOLDPLUS NEGATIVE 11/10/2021    Speciality Comments: PLQ Eye Exam: 10/08/2021 WNL @ [facility] [phone] . Follow up in 1 year. Humira 09/22    Procedures:  No procedures performed Allergies: Latex, Other, and Percocet [oxycodone-acetaminophen]   Assessment / Plan:     Visit Diagnoses: Rheumatoid arthritis involving multiple sites with positive rheumatoid factor ([place])  High risk medication use  Primary osteoarthritis of both hands  Dupuytren's contracture of right hand  Primary osteoarthritis of both knees  Chondromalacia  of left patella  DDD (degenerative disc disease), cervical  Psoriasis  Osteopenia of multiple sites  History of breast cancer  Heart murmur  History of anemia  History of asthma  Medial epicondylitis, right elbow  Morton's neuroma of right foot  History of shingles  Orders: No orders of the defined types were placed in this encounter.  No orders of the defined types were placed in this encounter.   Face-to-face time spent with patient was *** minutes. Greater than 50% of time was spent in counseling and coordination of care.  Follow-Up Instructions: No follow-ups on file.   Ofilia Neas, PA-C  Note - This record has been created using Dragon software.  Chart creation errors have been sought, but may not always  have been located. Such creation errors do not reflect on  the standard of medical care.

## 2022-02-28 ENCOUNTER — Other Ambulatory Visit: Payer: Self-pay | Admitting: Nurse Practitioner

## 2022-03-03 ENCOUNTER — Telehealth: Payer: Self-pay

## 2022-03-03 NOTE — Telephone Encounter (Signed)
Received prescription refill request for pantoprazole: Refill placed for 1 month: Pt was scheduled for an office visit with Carl Best NP on 03/26/2022 at 9:30 AM. Pt made aware Pt verbalized understanding with all questions answered.

## 2022-03-11 ENCOUNTER — Ambulatory Visit: Payer: 59 | Admitting: Physician Assistant

## 2022-03-11 DIAGNOSIS — M170 Bilateral primary osteoarthritis of knee: Secondary | ICD-10-CM

## 2022-03-11 DIAGNOSIS — M720 Palmar fascial fibromatosis [Dupuytren]: Secondary | ICD-10-CM

## 2022-03-11 DIAGNOSIS — M8589 Other specified disorders of bone density and structure, multiple sites: Secondary | ICD-10-CM

## 2022-03-11 DIAGNOSIS — Z8619 Personal history of other infectious and parasitic diseases: Secondary | ICD-10-CM

## 2022-03-11 DIAGNOSIS — R011 Cardiac murmur, unspecified: Secondary | ICD-10-CM

## 2022-03-11 DIAGNOSIS — M19041 Primary osteoarthritis, right hand: Secondary | ICD-10-CM

## 2022-03-11 DIAGNOSIS — Z8709 Personal history of other diseases of the respiratory system: Secondary | ICD-10-CM

## 2022-03-11 DIAGNOSIS — M2242 Chondromalacia patellae, left knee: Secondary | ICD-10-CM

## 2022-03-11 DIAGNOSIS — Z853 Personal history of malignant neoplasm of breast: Secondary | ICD-10-CM

## 2022-03-11 DIAGNOSIS — M5030 Other cervical disc degeneration, unspecified cervical region: Secondary | ICD-10-CM

## 2022-03-11 DIAGNOSIS — M7701 Medial epicondylitis, right elbow: Secondary | ICD-10-CM

## 2022-03-11 DIAGNOSIS — L409 Psoriasis, unspecified: Secondary | ICD-10-CM

## 2022-03-11 DIAGNOSIS — G5761 Lesion of plantar nerve, right lower limb: Secondary | ICD-10-CM

## 2022-03-11 DIAGNOSIS — M0579 Rheumatoid arthritis with rheumatoid factor of multiple sites without organ or systems involvement: Secondary | ICD-10-CM

## 2022-03-11 DIAGNOSIS — Z862 Personal history of diseases of the blood and blood-forming organs and certain disorders involving the immune mechanism: Secondary | ICD-10-CM

## 2022-03-11 DIAGNOSIS — Z79899 Other long term (current) drug therapy: Secondary | ICD-10-CM

## 2022-03-12 ENCOUNTER — Other Ambulatory Visit: Payer: Self-pay | Admitting: Physician Assistant

## 2022-03-12 DIAGNOSIS — M0579 Rheumatoid arthritis with rheumatoid factor of multiple sites without organ or systems involvement: Secondary | ICD-10-CM

## 2022-03-12 NOTE — Telephone Encounter (Signed)
Next Visit: 03/19/2022  Last Visit: 10/07/2021  Labs: 11/10/2021, CMP WNL.  Eosinophils remain elevated but are improving.  Rest of CBC WNL  Eye exam: 10/08/2021   Current Dose per office note 10/07/2021: Plaquenil 200 mg 1 tablet by mouth twice daily, Monday through Friday only  OZ:[id number] arthritis involving multiple sites with positive rheumatoid factor   Last Fill: 01/13/2022  Okay to refill Plaquenil?

## 2022-03-16 ENCOUNTER — Other Ambulatory Visit: Payer: Self-pay | Admitting: *Deleted

## 2022-03-16 DIAGNOSIS — M0579 Rheumatoid arthritis with rheumatoid factor of multiple sites without organ or systems involvement: Secondary | ICD-10-CM

## 2022-03-17 ENCOUNTER — Telehealth: Payer: Self-pay | Admitting: Obstetrics and Gynecology

## 2022-03-17 DIAGNOSIS — R739 Hyperglycemia, unspecified: Secondary | ICD-10-CM

## 2022-03-17 LAB — CBC WITH DIFFERENTIAL/PLATELET
Absolute Monocytes: 352 cells/uL (ref 200–950)
Basophils Absolute: 51 cells/uL (ref 0–200)
Basophils Relative: 0.8 %
Eosinophils Absolute: 378 cells/uL (ref 15–500)
Eosinophils Relative: 5.9 %
HCT: 41.6 % (ref 35.0–45.0)
Hemoglobin: 14.2 g/dL (ref 11.7–15.5)
Lymphs Abs: 1856 cells/uL (ref 850–3900)
MCH: 32.2 pg (ref 27.0–33.0)
MCHC: 34.1 g/dL (ref 32.0–36.0)
MCV: 94.3 fL (ref 80.0–100.0)
MPV: 11.3 fL (ref 7.5–12.5)
Monocytes Relative: 5.5 %
Neutro Abs: 3763 cells/uL (ref 1500–7800)
Neutrophils Relative %: 58.8 %
Platelets: 188 10*3/uL (ref 140–400)
RBC: 4.41 10*6/uL (ref 3.80–5.10)
RDW: 12.5 % (ref 11.0–15.0)
Total Lymphocyte: 29 %
WBC: 6.4 10*3/uL (ref 3.8–10.8)

## 2022-03-17 LAB — COMPLETE METABOLIC PANEL WITH GFR
AG Ratio: 1.7 (calc) (ref 1.0–2.5)
ALT: 15 U/L (ref 6–29)
AST: 15 U/L (ref 10–35)
Albumin: 4 g/dL (ref 3.6–5.1)
Alkaline phosphatase (APISO): 56 U/L (ref 37–153)
BUN: 15 mg/dL (ref 7–25)
CO2: 28 mmol/L (ref 20–32)
Calcium: 10 mg/dL (ref 8.6–10.4)
Chloride: 103 mmol/L (ref 98–110)
Creat: 0.68 mg/dL (ref 0.50–1.03)
Globulin: 2.4 g/dL (calc) (ref 1.9–3.7)
Glucose, Bld: 150 mg/dL — ABNORMAL HIGH (ref 65–99)
Potassium: 4.7 mmol/L (ref 3.5–5.3)
Sodium: 139 mmol/L (ref 135–146)
Total Bilirubin: 0.6 mg/dL (ref 0.2–1.2)
Total Protein: 6.4 g/dL (ref 6.1–8.1)
eGFR: 102 mL/min/{1.73_m2} (ref >=60)

## 2022-03-17 NOTE — Telephone Encounter (Signed)
Patient informed. Patient coming tomorrow at 9am.

## 2022-03-17 NOTE — Telephone Encounter (Signed)
Please contact patient regarding her recent blood work with her rheumatologist showing a glucose level of 150.   I recommend she come to the office for a lab visit to check a hemoglobin A1C.  This is a test for diabetes and prediabetes.  She does not need to fast for this blood work.  I will place a future order.

## 2022-03-18 ENCOUNTER — Other Ambulatory Visit: Payer: 59

## 2022-03-18 DIAGNOSIS — R739 Hyperglycemia, unspecified: Secondary | ICD-10-CM

## 2022-03-18 NOTE — Progress Notes (Unsigned)
Office Visit Note  Patient: Rebecca Fleming             Date of Birth: [birth year]           MRN: [id number]             PCP: Nunzio Cobbs, MD Referring: Aundria Rud* Visit Date: 03/19/2022 Occupation: '@GUAROCC'$ @  Subjective:  Medication monitoring   History of Present Illness: Rebecca Fleming is a 56 y.o. female with history of seropositive rheumatoid arthritis and osteoarthritis.  Patient is currently on Humira 40 mg sq injections every 14 days and Plaquenil 200 mg 1 tablet by mouth twice daily Monday through Friday.  She is tolerating combination therapy without any side effects or injection site reactions.  She has not missed any doses of Humira or Plaquenil recently.  She denies any signs or symptoms of a rheumatoid arthritis flare.  She has not been experiencing any morning stiffness or nocturnal pain.  She denies any difficulty with ADLs.  She remains active without difficulties.  She denies any recent falls or fractures.  She has been taking calcium and vitamin D supplement daily as recommended for osteopenia.  She has been seeing her dermatologist on a yearly basis as recommended.  She denies any recent infections.  She denies any other new medical conditions.     Activities of Daily Living:  Patient reports morning stiffness for 0  none .   Patient Denies nocturnal pain.  Difficulty dressing/grooming: Denies Difficulty climbing stairs: Denies Difficulty getting out of chair: Denies Difficulty using hands for taps, buttons, cutlery, and/or writing: Reports  Review of Systems  Constitutional:  Negative for fatigue.  HENT:  Negative for mouth sores, mouth dryness and nose dryness.   Eyes:  Positive for dryness. Negative for pain and visual disturbance.  Respiratory:  Negative for cough, hemoptysis, shortness of breath and difficulty breathing.   Cardiovascular:  Negative for chest pain, palpitations, hypertension and swelling in legs/feet.   Gastrointestinal:  Negative for blood in stool, constipation and diarrhea.  Endocrine: Positive for heat intolerance. Negative for increased urination.  Genitourinary:  Negative for difficulty urinating and painful urination.  Musculoskeletal:  Negative for joint pain, joint pain, joint swelling, myalgias, muscle weakness, morning stiffness, muscle tenderness and myalgias.  Skin:  Positive for rash. Negative for color change, pallor, hair loss, nodules/bumps, skin tightness, ulcers and sensitivity to sunlight.  Allergic/Immunologic: Negative for susceptible to infections.  Neurological:  Negative for dizziness, numbness, headaches and weakness.  Hematological:  Positive for bruising/bleeding tendency. Negative for swollen glands.  Psychiatric/Behavioral:  Negative for depressed mood and sleep disturbance. The patient is not nervous/anxious.     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   Blood transfusion without reported diagnosis 1976   1976  following surgery at [facility]) 08/2008   left breast--has bilateral mastectomy   Complication of anesthesia    pt states" gets shakes" with general   Cyst near tailbone     Elevated LDL cholesterol level    Environmental and seasonal allergies    Fibroid    Headache    sleeps , no meds   Heart murmur    never has caused any problems   Helicobacter pylori infection    Hemorrhoids    hx of   History of breast cancer    History of shingles    03/07/18   Hypotension    hx of   Post-operative nausea and vomiting    RA (rheumatoid arthritis) ([facility])    RA (rheumatoid arthritis) ([place])    Scoliosis    tx with robaxin prn - rarely uses   SVD (spontaneous vaginal delivery) 1996   x 1    Family History  Problem Relation Age of Onset   Breast cancer Mother    Diabetes Mother    Hypertension Mother    Stroke Mother    Irritable bowel syndrome Mother    Kidney disease Mother    Heart failure Father    Hypertension Father    Colon cancer Neg Hx    Colon polyps Neg Hx    Esophageal cancer Neg Hx    Pancreatic cancer Neg Hx    Rectal cancer Neg Hx    Stomach cancer Neg Hx    Past Surgical History:  Procedure Laterality Date   ABDOMINAL HYSTERECTOMY     APPENDECTOMY     BREAST LUMPECTOMY  05/17/2008   x 2 re excise for margin - Dr Margot Chimes, bilateral mastectomy 08/2008   BREAST LUMPECTOMY W/ NEEDLE LOCALIZATION  04/23/2008   w [facility] Dr Margot Chimes   CESAREAN SECTION  01/01/2004   Dr Quincy Simmonds   COLONOSCOPY  03/2018   COLONOSCOPY  05/05/2019   ENDOMETRIAL BIOPSY  06/2013, 2015   x 2   EYE SURGERY  2003   bilateral lasik   FOOT SURGERY     HYSTEROSCOPY WITH D & C  07/21/2011   Procedure: DILATATION AND CURETTAGE (D&C) /HYSTEROSCOPY;  Surgeon: Arloa Koh;  Location: [facility];  Service: Gynecology;  Laterality: N/A;  with Ultrasound Guidance   INSERTION OF TISSUE EXPANDER AFTER MASTECTOMY  09/11/2008   Dr Harlow Mares   KNEE ARTHROSCOPY W/ MENISCAL REPAIR Right 2010   --Dr. Telford Nab   LAPAROSCOPIC ASSISTED VAGINAL HYSTERECTOMY N/A 01/15/2015   Procedure: LAPAROSCOPIC ASSISTED VAGINAL HYSTERECTOMY;  Surgeon: Nunzio Cobbs, MD;  Location: [facility];  Service:  Gynecology;  Laterality: N/A;   left foot surgery  2004   left rotator cuff repair  01/2006   MASTECTOMY  09/11/2008   bilateral - Dr Margot Chimes   [facility] REMOVAL  09/09/2009   Dr Margot Chimes   [facility] PLACEMENT  05/17/2008   Dr Cori Razor rounds of chemo   REMOVAL OF TISSUE EXPANDER AND PLACEMENT OF IMPLANT  12/2008   RHINOPLASTY     SALPINGOOPHORECTOMY Bilateral 01/15/2015   Procedure: BILATERAL SALPINGO OOPHORECTOMY;  Surgeon: Nunzio Cobbs, MD;  Location: [facility];  Service: Gynecology;  Laterality: Bilateral;   UMBILICAL HERNIA REPAIR  09/09/2009   Dr Margot Chimes   UPPER GI ENDOSCOPY  03/2018   WISDOM TOOTH EXTRACTION     Social History   Social History Narrative  Right Handed    Lives in a two story home. Main living on first floor.   Immunization History  Administered Date(s) Administered   Influenza,inj,Quad PF,6+ Mos 07/12/2019   PFIZER(Purple Top)SARS-COV-2 Vaccination 10/05/2019, 10/25/2019, 05/11/2020   Pneumococcal Polysaccharide-23 01/16/2015   Tdap 09/21/2010, 07/12/2019     Objective: Vital Signs: BP 106/63 (BP Location: Right Arm, Patient Position: Sitting, Cuff Size: Normal)   Pulse 75   Resp 15   Ht '5\' 5"'$  (1.651 m)   Wt 160 lb (72.6 kg)   LMP 11/12/2014   BMI 26.63 kg/m    Physical Exam Vitals and nursing note reviewed.  Constitutional:      Appearance: She is well-developed.  HENT:     Head: Normocephalic and atraumatic.  Eyes:     Conjunctiva/sclera: Conjunctivae normal.  Cardiovascular:     Rate and Rhythm: Normal rate and regular rhythm.     Heart sounds: Normal heart sounds.  Pulmonary:     Effort: Pulmonary effort is normal.     Breath sounds: Normal breath sounds.  Abdominal:     General: Bowel sounds are normal.     Palpations: Abdomen is soft.  Musculoskeletal:     Cervical back: Normal range of motion.  Skin:    General: Skin is warm and dry.     Capillary Refill: Capillary refill takes less than 2 seconds.  Neurological:      Mental Status: She is alert and oriented to person, place, and time.  Psychiatric:        Behavior: Behavior normal.      Musculoskeletal Exam: C-spine, thoracic spine, lumbar spine have good range of motion.  No midline spinal tenderness noted.  No other joints, elbow joints, wrist joints, MCPs, PIPs, DIPs have good range of motion with no synovitis.  Prominence of DIP joints. Complete fist formation bilaterally.  Hip joints have good range of motion with no groin pain.  Some tenderness palpation over bilateral trochanteric bursa.  Knee joints have good range of motion with no warmth or effusion.  Ankle joints have good range of motion with no tenderness or joint swelling.  CDAI Exam: CDAI Score: 0.2  Patient Global: 1 mm; Provider Global: 1 mm Swollen: 0 ; Tender: 0  Joint Exam 03/19/2022   No joint exam has been documented for this visit   There is currently no information documented on the homunculus. Go to the Rheumatology activity and complete the homunculus joint exam.  Investigation: No additional findings.  Imaging: No results found.  Recent Labs: Lab Results  Component Value Date   WBC 6.4 03/16/2022   HGB 14.2 03/16/2022   PLT 188 03/16/2022   NA 139 03/16/2022   K 4.7 03/16/2022   CL 103 03/16/2022   CO2 28 03/16/2022   GLUCOSE 150 (H) 03/16/2022   BUN 15 03/16/2022   CREATININE 0.68 03/16/2022   BILITOT 0.6 03/16/2022   ALKPHOS 66 03/31/2017   AST 15 03/16/2022   ALT 15 03/16/2022   PROT 6.4 03/16/2022   ALBUMIN 4.1 03/31/2017   CALCIUM 10.0 03/16/2022   GFRAA 105 02/24/2021   QFTBGOLDPLUS NEGATIVE 11/10/2021    Speciality Comments: PLQ Eye Exam: 10/08/2021 WNL @ [facility] [phone] . Follow up in 1 year. Humira 09/22    Procedures:  No procedures performed Allergies: Latex, Other, and Percocet [oxycodone-acetaminophen]    Assessment / Plan:     Visit Diagnoses: Rheumatoid arthritis involving multiple  sites with positive rheumatoid factor (  [place]) - +RF, +CCP: She has no joint tenderness or synovitis on examination today.  She has not had any signs or symptoms of a rheumatoid arthritis flare.  She has clinically been doing well taking Plaquenil 200 mg 1 tablet by mouth twice daily Monday through Friday and Humira 40 mg subcutaneous injections once every 14 days.  She is tolerating both medications without any side effects or injection site reactions.  She has not missed any doses recently.  She has not had any recent or recurrent infections.  She has not been experiencing any morning stiffness, nocturnal pain, or difficulty with ADLs.  She remains active performing aerobic exercises daily.  She will remain on the current treatment regimen.  She was advised to notify us if she develops signs or symptoms of a flare.  She will follow-up in the office in 5 months or sooner if needed.  High risk medication use - Humira 40 mg subcutaneous injections every 14 days and Plaquenil 200 mg 1 tablet by mouth twice daily, Monday through Friday only. PLQ Eye Exam: 10/08/2021 WNL @ [facility]  CBC and CMP were drawn on 03/16/2022.  Her next lab work will be due in September and every 3 months to monitor for drug toxicity.  Standing orders for CBC and CMP remain in place.   TB Gold negative on 11/10/2021. She has not had any recent infections. Discussed the importance of holding Humira if she develops signs or symptoms of infection and to resume once infection has completely cleared. Discussed the importance of yearly skin examinations while on Humira due to the increased risk for skin cancer.  Scheduled for December 2023.   Primary osteoarthritis of both hands: DIP prominence consistent with osteoarthritis of both hands.   Dupuytren's contracture of right hand  Primary osteoarthritis of both knees: She has good ROM of both knee joints with no warmth or effusion.   Chondromalacia of  left patella: Experiences intermittent crepitus. Discussed the importance of lower extremity muscle strengthening.   DDD (degenerative disc disease), cervical: She has good ROM with no discomfort.   Psoriasis: No active psoriasis at this time.   Osteopenia of multiple sites: DEXA updated on 11/20/21: The BMD measured at Femur Neck Right is 0.798 g/cm2 with a T-score of -1.7.  She is taking a calcium and vitamin D supplement daily.  She has not had any recent falls or fractures.  She continues to perform aerobic exercises daily.  Other medical conditions are listed as follows:  History of breast cancer  Heart murmur  History of anemia  History of asthma  Orders: No orders of the defined types were placed in this encounter.  No orders of the defined types were placed in this encounter.    Follow-Up Instructions: Return in about 5 months (around 08/19/2022) for Rheumatoid arthritis, Osteoarthritis.   Ofilia Neas, PA-C  Note - This record has been created using Dragon software.  Chart creation errors have been sought, but may not always  have been located. Such creation errors do not reflect on  the standard of medical care.

## 2022-03-19 ENCOUNTER — Ambulatory Visit: Payer: 59 | Admitting: Physician Assistant

## 2022-03-19 ENCOUNTER — Encounter: Payer: Self-pay | Admitting: Physician Assistant

## 2022-03-19 VITALS — BP 106/63 | HR 75 | Resp 15 | Ht 65.0 in | Wt 160.0 lb

## 2022-03-19 DIAGNOSIS — M8589 Other specified disorders of bone density and structure, multiple sites: Secondary | ICD-10-CM

## 2022-03-19 DIAGNOSIS — R011 Cardiac murmur, unspecified: Secondary | ICD-10-CM

## 2022-03-19 DIAGNOSIS — M0579 Rheumatoid arthritis with rheumatoid factor of multiple sites without organ or systems involvement: Secondary | ICD-10-CM | POA: Diagnosis not present

## 2022-03-19 DIAGNOSIS — M2242 Chondromalacia patellae, left knee: Secondary | ICD-10-CM

## 2022-03-19 DIAGNOSIS — L409 Psoriasis, unspecified: Secondary | ICD-10-CM

## 2022-03-19 DIAGNOSIS — Z79899 Other long term (current) drug therapy: Secondary | ICD-10-CM

## 2022-03-19 DIAGNOSIS — M720 Palmar fascial fibromatosis [Dupuytren]: Secondary | ICD-10-CM

## 2022-03-19 DIAGNOSIS — Z853 Personal history of malignant neoplasm of breast: Secondary | ICD-10-CM

## 2022-03-19 DIAGNOSIS — M19041 Primary osteoarthritis, right hand: Secondary | ICD-10-CM

## 2022-03-19 DIAGNOSIS — M5030 Other cervical disc degeneration, unspecified cervical region: Secondary | ICD-10-CM

## 2022-03-19 DIAGNOSIS — M170 Bilateral primary osteoarthritis of knee: Secondary | ICD-10-CM

## 2022-03-19 DIAGNOSIS — Z862 Personal history of diseases of the blood and blood-forming organs and certain disorders involving the immune mechanism: Secondary | ICD-10-CM

## 2022-03-19 DIAGNOSIS — Z8709 Personal history of other diseases of the respiratory system: Secondary | ICD-10-CM

## 2022-03-19 DIAGNOSIS — M19042 Primary osteoarthritis, left hand: Secondary | ICD-10-CM

## 2022-03-19 LAB — HEMOGLOBIN A1C
Hgb A1c MFr Bld: 5.3 % of total Hgb (ref <5.7)
Mean Plasma Glucose: 105 mg/dL
eAG (mmol/L): 5.8 mmol/L

## 2022-03-19 NOTE — Patient Instructions (Addendum)
Standing Labs We placed an order today for your standing lab work.   Please have your standing labs drawn in September and every 3 months   If possible, please have your labs drawn 2 weeks prior to your appointment so that the provider can discuss your results at your appointment.  Please note that you may see your imaging and lab results in [facility] before we have reviewed them. We may be awaiting multiple results to interpret others before contacting you. Please allow our office up to 72 hours to thoroughly review all of the results before contacting the office for clarification of your results.  We have open lab daily: Monday through Thursday from 1:30-4:30 PM and Friday from 1:30-4:00 PM at the office of Dr. Bo Merino, [facility].   Please be advised, all patients with office appointments requiring lab work will take precedent over walk-in lab work.  If possible, please come for your lab work on Monday and Friday afternoons, as you may experience shorter wait times. The office is located at [address], [place], [place], [place] [address] No appointment is necessary.   Labs are drawn by [facility]. Please bring your co-pay at the time of your lab draw.  You may receive a bill from [facility] for your lab work.  Please note if you are on Hydroxychloroquine and and an order has been placed for a Hydroxychloroquine level, you will need to have it drawn 4 hours or more after your last dose.  If you wish to have your labs drawn at another location, please call the office 24 hours in advance to send orders.  If you have any questions regarding directions or hours of operation,  please call [phone].   As a reminder, please drink plenty of water prior to coming for your lab work. Thanks!  If you have signs or symptoms of an infection or start antibiotics: First, call your PCP for workup of your infection. Hold your medication through the infection, until you complete  your antibiotics, and until symptoms resolve if you take the following: Injectable medication (Actemra, Benlysta, Cimzia, Cosentyx, Enbrel, Humira, Kevzara, Orencia, Remicade, Simponi, Stelara, Taltz, Tremfya) Methotrexate Leflunomide (Arava) Mycophenolate (Cellcept) Morrie Sheldon, Olumiant, or Rinvoq  Vaccines You are taking a medication(s) that can suppress your immune system.  The following immunizations are recommended: Flu annually Covid-19  Td/Tdap (tetanus, diphtheria, pertussis) every 10 years Pneumonia (Prevnar 15 then Pneumovax 23 at least 1 year apart.  Alternatively, can take Prevnar 20 without needing additional dose) Shingrix: 2 doses from 4 weeks to 6 months apart  Please check with your PCP to make sure you are up to date.

## 2022-03-22 ENCOUNTER — Other Ambulatory Visit: Payer: Self-pay | Admitting: Physician Assistant

## 2022-03-22 DIAGNOSIS — M0579 Rheumatoid arthritis with rheumatoid factor of multiple sites without organ or systems involvement: Secondary | ICD-10-CM

## 2022-03-23 NOTE — Telephone Encounter (Signed)
Next Visit: 09/08/2022  Last Visit: 03/19/2022  Last Fill: 11/26/2021  DX: Rheumatoid arthritis involving multiple sites with positive rheumatoid factor   Current Dose per office note 03/19/2022: Humira 40 mg subcutaneous injections every 14 days  Labs: 03/16/2022 CBC is normal.  CMP shows elevated glucose at 150.  Please notify patient.  I will forward results to her PCP.  TB Gold: 11/10/2021,  TB Gold is negative.  Okay to refill Humira?

## 2022-03-25 NOTE — Progress Notes (Unsigned)
03/25/2022 Rebecca Fleming [id number] 14-Jun-1966   Chief Complaint:  History of Present Illness:  Rebecca Fleming is a 56 year old female with a past medical history of arthritis, smoker, breast cancer status post bilateral mastectomy 2009, rheumatoid arthritis, GERD, esophageal stricture, H. Pylori gastritis 2019 and anemia.    She underwent an EGD by Dr. Fuller Plan 09/30/2017 which showed H. pylori gastritis which was treated with Pylera and PPI for 2 weeks.  She underwent a colonoscopy at the same time and a 28 mm tubulovillous adenomatous polyp was removed by piecemeal from the sigmoid colon.  She underwent a small bowel capsule endoscopy 07/15/2018 which showed very minimal fissuring to the proximal duodenum otherwise was negative.  A repeat colonoscopy was done 05/05/2019 and the tattoo from the prior polypectomy site was intact without evidence of residual polyp.  A 6 mm polyp was removed from the sigmoid colon and six  2-3 polyps were removed from the sigmoid colon.  Pathology report was consistent with hyperplastic polyps and benign colonic mucosa.  A repeat colonoscopy in 3 years was recommended     Latest Ref Rng & Units 03/16/2022    2:11 PM 11/10/2021    2:22 PM 07/28/2021    1:49 PM  CBC  WBC 3.8 - 10.8 Thousand/uL 6.4  5.7  6.1   Hemoglobin 11.7 - 15.5 g/dL 14.2  14.3  14.0   Hematocrit 35.0 - 45.0 % 41.6  42.5  41.6   Platelets 140 - 400 Thousand/uL 188  199  188        Latest Ref Rng & Units 03/16/2022    2:11 PM 11/10/2021    2:22 PM 09/10/2021   10:15 AM  CMP  Glucose 65 - 99 mg/dL 150  87    BUN 7 - 25 mg/dL 15  13    Creatinine 0.50 - 1.03 mg/dL 0.68  0.72    Sodium 135 - 146 mmol/L 139  142    Potassium 3.5 - 5.3 mmol/L 4.7  4.7    Chloride 98 - 110 mmol/L 103  104    CO2 20 - 32 mmol/L 28  32    Calcium 8.6 - 10.4 mg/dL 10.0  9.9    Total Protein 6.1 - 8.1 g/dL 6.4  6.5  6.4   Total Bilirubin 0.2 - 1.2 mg/dL 0.6  0.7    AST 10 - 35 U/L 15  15    ALT 6 - 29 U/L 15   13       PAST GI PROCEDURES: Colonoscopy 05/05/2019: - A tattoo was seen in the sigmoid colon. A post-polypectomy scar was found at the tattoo site. There was no evidence of residual polyp tissue. - One 6 mm polyp in the sigmoid colon, removed with a cold snare. Resected and retrieved. - Six 2 to 3 mm polyps in the sigmoid colon, removed with a cold biopsy forceps. Resected and retrieved. - Internal hemorrhoids. - The examination was otherwise normal on direct and retroflexion views. - 3 year recall colonoscopy HYPERPLASTIC POLYP (2 FRAGMENTS) - MULTIPLE FRAGMENTS OF BENIGN COLONIC MUCOSA - NO HIGH GRADE DYSPLASIA OR MALIGNANCY IDENTIFIED   Small bowel capsule endoscopy 07/15/2018: Complete study, fairly good prep Very minimal fissuring proximal duodenum Otherwise negative study   EGD 03/30/2018; - LA Grade A reflux esophagitis. - Benign-appearing esophageal stenosis. Dilated. - Gastritis. Biopsied. - Medium-sized hiatal hernia. - Normal duodenal bulb and second portion of the duodenum.   Colonoscopy 03/30/2018: One 28  mm polyp in the sigmoid colon, removed piecemeal using a hot snare. Resected and retrieved. Tattooed. - The examination was otherwise normal on direct and retroflexion views.   Biopsy report: 1. Surgical [P], sigmoid (removed piecemeal), polyp - TUBULOVILLOUS ADENOMA (TWO FRAGMENTS). - NO HIGH GRADE DYSPLASIA OR MALIGNANCY. 2. Surgical [P], gastric body (gastritis) - CHRONIC MILDLY ACTIVE HELICOBACTER PYLORI GASTRITIS. - NO INTESTINAL METAPLASIA, DYSPLASIA OR MALIGNANCY    Current Medications, Allergies, Past Medical History, Past Surgical History, Family History and Social History were reviewed in [employer] record.   Review of Systems:   Constitutional: Negative for fever, sweats, chills or weight loss.  Respiratory: Negative for shortness of breath.   Cardiovascular: Negative for chest pain, palpitations and leg swelling.   Gastrointestinal: See HPI.  Musculoskeletal: Negative for back pain or muscle aches.  Neurological: Negative for dizziness, headaches or paresthesias.    Physical Exam: LMP 11/12/2014  General: Well developed, w   ***female in no acute distress. Head: Normocephalic and atraumatic. Eyes: No scleral icterus. Conjunctiva pink . Ears: Normal auditory acuity. Mouth: Dentition intact. No ulcers or lesions.  Lungs: Clear throughout to auscultation. Heart: Regular rate and rhythm, no murmur. Abdomen: Soft, nontender and nondistended. No masses or hepatomegaly. Normal bowel sounds x 4 quadrants.  Rectal: *** Musculoskeletal: Symmetrical with no gross deformities. Extremities: No edema. Neurological: Alert oriented x 4. No focal deficits.  Psychological: Alert and cooperative. Normal mood and affect  Assessment and Recommendations:  76) 56 year old female with a history of GERD.  H. pylori gastritis 2019 treated with Pylera and PPI.  Post reatment H. pylori stool antigen/breath test not done as patient remains on PPI. -Continue Pantoprazole 40 mg once daily -PT to call our office if reflux symptoms recur -Follow-up in the office in 1 year and as needed   2) History of a large 28 mm tubulovillous adenomatous polyp removed from the sigmoid colon the time of her colonoscopy 03/2018.  Follow-up colonoscopy 04/2019 without evidence of any residual polyp and 6 hyperplastic polyps were removed from the sigmoid colon. -Next colonoscopy due 04/2022   3) History of breast cancer    4) History of rheumatoid arthritis

## 2022-03-26 ENCOUNTER — Encounter: Payer: Self-pay | Admitting: Nurse Practitioner

## 2022-03-26 ENCOUNTER — Ambulatory Visit: Payer: 59 | Admitting: Nurse Practitioner

## 2022-03-26 VITALS — BP 90/54 | HR 68 | Ht 65.0 in | Wt 159.4 lb

## 2022-03-26 DIAGNOSIS — K219 Gastro-esophageal reflux disease without esophagitis: Secondary | ICD-10-CM | POA: Diagnosis not present

## 2022-03-26 DIAGNOSIS — Z860100 Personal history of colon polyps, unspecified: Secondary | ICD-10-CM

## 2022-03-26 MED ORDER — PANTOPRAZOLE SODIUM 40 MG PO TBEC
40.0000 mg | DELAYED_RELEASE_TABLET | Freq: Every day | ORAL | 3 refills | Status: DC
Start: 2022-03-26 — End: 2023-03-02

## 2022-03-26 MED ORDER — NA SULFATE-K SULFATE-MG SULF 17.5-3.13-1.6 GM/177ML PO SOLN: 1.0000 | Freq: Once | ORAL | 0 refills | Status: AC

## 2022-03-26 NOTE — Patient Instructions (Addendum)
We have sent the following medications to your pharmacy for you to pick up at your convenience: Pantoprazole 40 mg daily 30-60 minutes before breakfast.  You have been scheduled for a colonoscopy. Please follow written instructions given to you at your visit today.  Please pick up your prep supplies at the pharmacy within the next 1-3 days. If you use inhalers (even only as needed), please bring them with you on the day of your procedure.  If you are age 58 or older, your body mass index should be between 23-30. Your Body mass index is 26.52 kg/m. If this is out of the aforementioned range listed, please consider follow up with your Primary Care Provider.  If you are age 75 or younger, your body mass index should be between 19-25. Your Body mass index is 26.52 kg/m. If this is out of the aformentioned range listed, please consider follow up with your Primary Care Provider.   ________________________________________________________  The [facility] providers would like to encourage you to use [facility] to communicate with providers for non-urgent requests or questions.  Due to long hold times on the telephone, sending your provider a message by [facility] may be a faster and more efficient way to get a response.  Please allow 48 business hours for a response.  Please remember that this is for non-urgent requests.  _______________________________________________________

## 2022-04-22 ENCOUNTER — Encounter: Payer: Self-pay | Admitting: Gastroenterology

## 2022-04-29 ENCOUNTER — Encounter: Payer: Self-pay | Admitting: Gastroenterology

## 2022-04-29 ENCOUNTER — Telehealth: Payer: Self-pay | Admitting: Gastroenterology

## 2022-04-29 ENCOUNTER — Ambulatory Visit (AMBULATORY_SURGERY_CENTER): Payer: 59 | Admitting: Gastroenterology

## 2022-04-29 VITALS — BP 125/68 | HR 58 | Temp 99.6°F | Resp 18 | Ht 65.0 in | Wt 159.0 lb

## 2022-04-29 DIAGNOSIS — Z86010 Personal history of colonic polyps: Secondary | ICD-10-CM | POA: Diagnosis not present

## 2022-04-29 DIAGNOSIS — Z09 Encounter for follow-up examination after completed treatment for conditions other than malignant neoplasm: Secondary | ICD-10-CM

## 2022-04-29 MED ORDER — SODIUM CHLORIDE 0.9 % IV SOLN: 500.0000 mL | Freq: Once | INTRAVENOUS | Status: DC

## 2022-04-29 NOTE — Progress Notes (Signed)
To pacu, VSS. Report [email or web address] Charted by Gershon Crane

## 2022-04-29 NOTE — Progress Notes (Signed)
History & Physical  Primary Care Physician:  Nunzio Cobbs, MD Primary Gastroenterologist: Lucio Edward, MD  CHIEF COMPLAINT: Personal history of colon polyps   HPI: Rebecca Fleming is a 56 y.o. 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   Past Medical History:  Diagnosis Date   Abnormal uterine bleeding    Allergy    Anemia    Arthritis    Asthma    exercised induced asthma - rarely uses inhaler;none since Chemo.   Blood transfusion without reported diagnosis 1976   1976  following surgery at [facility]) 08/2008   left breast--has bilateral mastectomy   Complication of anesthesia    pt states" gets shakes" with general   Cyst near tailbone    Elevated LDL cholesterol level    Environmental and seasonal allergies    Fibroid    Headache    sleeps , no meds   Heart murmur    never has caused any problems   Helicobacter pylori infection    Hemorrhoids    hx of   History of breast cancer    History of shingles    03/07/18   Hypotension    hx of   Post-operative nausea and vomiting    RA (rheumatoid arthritis) (HCC)    RA (rheumatoid arthritis) ([place])    Scoliosis    tx with robaxin prn - rarely uses   SVD (spontaneous vaginal delivery) 1996   x 1    Past Surgical History:  Procedure Laterality Date   ABDOMINAL HYSTERECTOMY     APPENDECTOMY     BREAST LUMPECTOMY  05/17/2008   x 2 re excise for margin - Dr Margot Chimes, bilateral mastectomy 08/2008   BREAST LUMPECTOMY W/ NEEDLE LOCALIZATION  04/23/2008   w [facility] Dr Margot Chimes   CESAREAN SECTION  01/01/2004   Dr Quincy Simmonds   COLONOSCOPY  03/2018   COLONOSCOPY  05/05/2019   ENDOMETRIAL BIOPSY  06/2013, 2015   x 2   EYE SURGERY  2003   bilateral lasik   FOOT SURGERY     HYSTEROSCOPY WITH D & C  07/21/2011   Procedure: DILATATION AND CURETTAGE (D&C) /HYSTEROSCOPY;  Surgeon: Arloa Koh;  Location: [facility];  Service: Gynecology;  Laterality: N/A;  with  Ultrasound Guidance   INSERTION OF TISSUE EXPANDER AFTER MASTECTOMY  09/11/2008   Dr Harlow Mares   KNEE ARTHROSCOPY W/ MENISCAL REPAIR Right 2010   --Dr. Telford Nab   LAPAROSCOPIC ASSISTED VAGINAL HYSTERECTOMY N/A 01/15/2015   Procedure: LAPAROSCOPIC ASSISTED VAGINAL HYSTERECTOMY;  Surgeon: Nunzio Cobbs, MD;  Location: [facility];  Service: Gynecology;  Laterality: N/A;   left foot surgery  2004   left rotator cuff repair  01/2006   MASTECTOMY  09/11/2008   bilateral - Dr Margot Chimes   [facility] REMOVAL  09/09/2009   Dr Margot Chimes   [facility] PLACEMENT  05/17/2008   Dr Cori Razor rounds of chemo   REMOVAL OF TISSUE EXPANDER AND PLACEMENT OF IMPLANT  12/2008   RHINOPLASTY     SALPINGOOPHORECTOMY Bilateral 01/15/2015   Procedure: BILATERAL SALPINGO OOPHORECTOMY;  Surgeon: Nunzio Cobbs, MD;  Location: [facility];  Service: Gynecology;  Laterality: Bilateral;   UMBILICAL HERNIA REPAIR  09/09/2009   Dr Margot Chimes   UPPER GI ENDOSCOPY  03/2018   WISDOM TOOTH EXTRACTION      Prior to Admission medications   Medication Sig Start Date End Date  Taking? Authorizing Provider  calcium carbonate (OS-CAL) 600 MG TABS tablet Take 600 mg by mouth 2 (two) times daily with a meal.   Yes [provider]  cetirizine (ZYRTEC) 10 MG tablet Take 10 mg by mouth daily.   Yes [provider]  COLLAGEN PO Take by mouth daily.   Yes [provider]  FOLIC ACID PO Take by mouth.   Yes [provider]  GLUCOSAMINE PO Take 2 tablets by mouth daily.   Yes [provider]  HUMIRA PEN 40 MG/0.4ML PNKT INJECT '40MG'$  SUBCUTANEOUSLY EVERY OTHER WEEK 03/23/22  Yes Ofilia Neas, PA-C  hydroxychloroquine (PLAQUENIL) 200 MG tablet TAKE 1 TABLET BY MOUTH TWICE DAILY ON MONDAY AND FRIDAY 03/12/22  Yes Ofilia Neas, PA-C  Magnesium 500 MG TABS Take 1 tablet by mouth 2 (two) times daily.   Yes [provider]  Manganese 50 MG TABS Take 1 tablet by mouth daily.   Yes [provider]  pantoprazole (PROTONIX) 40 MG tablet Take 1 tablet (40 mg total) by mouth daily. 03/26/22  Yes Noralyn Pick, NP  Polyvinyl Alcohol-Povidone (REFRESH OP) Apply to eye.   Yes [provider]  TURMERIC PO Take by mouth daily.   Yes [provider]  ibuprofen (ADVIL) 200 MG tablet Take 200 mg by mouth as needed.    [provider]  methocarbamol (ROBAXIN) 500 MG tablet Take 1 tablet (500 mg total) by mouth every 8 (eight) hours as needed for muscle spasms. Patient not taking: Reported on 04/29/2022 01/17/20   Jessy Oto, MD    Current Outpatient Medications  Medication Sig Dispense Refill   calcium carbonate (OS-CAL) 600 MG TABS tablet Take 600 mg by mouth 2 (two) times daily with a meal.     cetirizine (ZYRTEC) 10 MG tablet Take 10 mg by mouth daily.     COLLAGEN PO Take by mouth daily.     FOLIC ACID PO Take by mouth.     GLUCOSAMINE PO Take 2 tablets by mouth daily.     HUMIRA PEN 40 MG/0.4ML PNKT INJECT '40MG'$  SUBCUTANEOUSLY EVERY OTHER WEEK 6 each 0   hydroxychloroquine (PLAQUENIL) 200 MG tablet TAKE 1 TABLET BY MOUTH TWICE DAILY ON MONDAY AND FRIDAY 120 tablet 0   Magnesium 500 MG TABS Take 1 tablet by mouth 2 (two) times daily.     Manganese 50 MG TABS Take 1 tablet by mouth daily.     pantoprazole (PROTONIX) 40 MG tablet Take 1 tablet (40 mg total) by mouth daily. 90 tablet 3   Polyvinyl Alcohol-Povidone (REFRESH OP) Apply to eye.     TURMERIC PO Take by mouth daily.     ibuprofen (ADVIL) 200 MG tablet Take 200 mg by mouth as needed.     methocarbamol (ROBAXIN) 500 MG tablet Take 1 tablet (500 mg total) by mouth every 8 (eight) hours as needed for muscle spasms. (Patient not taking: Reported on 04/29/2022) 30 tablet 1   Current Facility-Administered Medications  Medication Dose Route Frequency Provider Last Rate Last Admin   0.9 %  sodium chloride infusion  500 mL Intravenous Once Ladene Artist, MD        Allergies as of  04/29/2022 - Review Complete 04/29/2022  Allergen Reaction Noted   Latex Other (See Comments) 07/10/2011   Other Swelling 08/01/2014   Percocet [oxycodone-acetaminophen] Itching 01/21/2015    Family History  Problem Relation Age of Onset   Breast cancer Mother  Diabetes Mother    Hypertension Mother    Stroke Mother    Irritable bowel syndrome Mother    Kidney disease Mother    Heart failure Father    Hypertension Father    Colon cancer Neg Hx    Colon polyps Neg Hx    Esophageal cancer Neg Hx    Pancreatic cancer Neg Hx    Rectal cancer Neg Hx    Stomach cancer Neg Hx     Social History   Socioeconomic History   Marital status: Married    Spouse name: Not on file   Number of children: 1   Years of education: Not on file   Highest education level: Not on file  Occupational History   Not on file  Tobacco Use   Smoking status: Never   Smokeless tobacco: Never  Vaping Use   Vaping Use: Never used  Substance and Sexual Activity   Alcohol use: Yes    Alcohol/week: 2.0 standard drinks of alcohol    Types: 2 Cans of beer per week    Comment: occ glass of wine or beer   Drug use: No   Sexual activity: Yes    Partners: Male    Birth control/protection: Post-menopausal    Comment: Hyst/BSO  Other Topics Concern   Not on file  Social History Narrative   Right Handed    Lives in a two story home. Main living on first floor.   Social Determinants of Health   Financial Resource Strain: Not on file  Food Insecurity: Not on file  Transportation Needs: Not on file  Physical Activity: Not on file  Stress: Not on file  Social Connections: Not on file  Intimate Partner Violence: Not on file    Review of Systems:  All systems reviewed were negative except where noted in HPI.   Physical Exam: General:  Alert, well-developed, in NAD Head:  Normocephalic and atraumatic. Eyes:  Sclera clear, no icterus.   Conjunctiva pink. Ears:  Normal auditory acuity. Mouth:   No deformity or lesions.  Neck:  Supple; no masses . Lungs:  Clear throughout to auscultation.   No wheezes, crackles, or rhonchi. No acute distress. Heart:  Regular rate and rhythm; no murmurs. Abdomen:  Soft, nondistended, nontender. No masses, hepatomegaly. No obvious masses.  Normal bowel .    Rectal:  Deferred   Msk:  Symmetrical without gross deformities.. Pulses:  Normal pulses noted. Extremities:  Without edema. Neurologic:  Alert and  oriented x4;  grossly normal neurologically. Skin:  Intact without significant lesions or rashes. Cervical Nodes:  No significant cervical adenopathy. Psych:  Alert and cooperative. Normal mood and affect.   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  Pricilla Riffle. Fuller Plan  04/29/2022, 1:35 PM See Shea Evans, [facility], to contact our on call provider

## 2022-04-29 NOTE — Telephone Encounter (Signed)
Returned pt's call. States she was experiencing pretty severe cramps earlier after her colonoscopy today. States the pain was so bad that she could  no longer stand and needed to lay down. States she tried ambulating but again had to lay down d/t pain. States she drank some hot coffee and has been using a heating pad and the pain feels better at this time. Pt asked if she could take GasX, RN instructed that she could take that and that would have been the next suggestion. Pt instructed to call back using after hours number provided on AVS is pain worsens again. Pt concerned as she has not experienced this amount of pain/cramping after previous colonoscopies. Pt verbalized understanding. MD made aware.

## 2022-04-29 NOTE — Telephone Encounter (Signed)
Called pt back to give MD recommendations. Pt verbalized understanding and states that the [place] has already helped to relieve more discomfort since we spoke a little while ago.

## 2022-04-29 NOTE — Telephone Encounter (Signed)
Patient's husband called stated, patient is having severe abdominal pain after procedure today. Requested a call back as soon as possible. Please call to advise

## 2022-04-29 NOTE — Progress Notes (Signed)
Pt's states no medical or surgical changes since previsit or office visit. 

## 2022-04-29 NOTE — Patient Instructions (Signed)

## 2022-04-29 NOTE — Telephone Encounter (Signed)
Sorry to hear she is experiencing abdominal cramping. Rest, heating pad, light diet, GasX qid prn and IBgard 1-2 po tid prn. If symptoms persist or become severe again contact us or proceed to ED for evaluation.

## 2022-04-29 NOTE — Progress Notes (Signed)
Room-verifications, room-access/meds and intra procedure vital signs charted by Gershon Crane, CRNA under this user, while awaiting return of Epic access.

## 2022-04-29 NOTE — Op Note (Signed)
[facility] Patient Name: Rebecca Fleming Procedure Date: 04/29/2022 1:39 PM MRN: [id number] Endoscopist: Ladene Artist , MD Age: 56 Referring MD:  Date of Birth: May 08, 1966 Gender: Female Account #: [id number] Procedure:                Colonoscopy Indications:              High risk colon cancer surveillance: Personal                            history of adenoma with villous component Medicines:                Monitored Anesthesia Care Procedure:                Pre-Anesthesia Assessment:                           - Prior to the procedure, a History and Physical                            was performed, and patient medications and                            allergies were reviewed. The patient's tolerance of                            previous anesthesia was also reviewed. The risks                            and benefits of the procedure and the sedation                            options and risks were discussed with the patient.                            All questions were answered, and informed consent                            was obtained. Prior Anticoagulants: The patient has                            taken no previous anticoagulant or antiplatelet                            agents. ASA Grade Assessment: II - A patient with                            mild systemic disease. After reviewing the risks                            and benefits, the patient was deemed in                            satisfactory condition to undergo the procedure.  After obtaining informed consent, the colonoscope                            was passed under direct vision. Throughout the                            procedure, the patient's blood pressure, pulse, and                            oxygen saturations were monitored continuously. The                            CF [id number] #[id number] was introduced through the anus                            and advanced to the  the cecum, identified by                            appendiceal orifice and ileocecal valve. The                            ileocecal valve, appendiceal orifice, and rectum                            were photographed. The quality of the bowel                            preparation was good. The colonoscopy was performed                            without difficulty. The patient tolerated the                            procedure well. Scope In: 1:43:17 PM Scope Out: 1:58:43 PM Scope Withdrawal Time: 0 hours 9 minutes 56 seconds  Total Procedure Duration: 0 hours 15 minutes 26 seconds  Findings:                 The perianal and digital rectal examinations were                            normal.                           A tattoo was seen in the sigmoid colon. There was                            no evidence of residual polyp tissue.                           Internal hemorrhoids were found during                            retroflexion. The hemorrhoids were small and Grade  I (internal hemorrhoids that do not prolapse).                           The exam was otherwise without abnormality on                            direct and retroflexion views. Complications:            No immediate complications. Estimated blood loss:                            None. Estimated Blood Loss:     Estimated blood loss: none. Impression:               - A tattoo was seen in the sigmoid colon. There was                            no evidence of residual polyp tissue.                           - Internal hemorrhoids.                           - The examination was otherwise normal on direct                            and retroflexion views.                           - No specimens collected. Recommendation:           - Repeat colonoscopy in 5 years for surveillance.                           - Patient has a contact number available for                            emergencies. The  signs and symptoms of potential                            delayed complications were discussed with the                            patient. Return to normal activities tomorrow.                            Written discharge instructions were provided to the                            patient.                           - Resume previous diet.                           - Continue present medications. Ladene Artist, MD 04/29/2022 2:03:21 PM This report has been signed electronically.

## 2022-04-30 ENCOUNTER — Telehealth: Payer: Self-pay

## 2022-04-30 NOTE — Telephone Encounter (Signed)
  Follow up Call-     04/29/2022   12:58 PM  Call back number  Post procedure Call Back phone  # [phone]  Permission to leave phone message Yes     Patient questions:  Do you have a fever, pain , or abdominal swelling? No. Pain Score  0 *  Have you tolerated food without any problems? Yes.    Have you been able to return to your normal activities? Yes.    Do you have any questions about your discharge instructions: Diet   No. Medications  No. Follow up visit  No.  Do you have questions or concerns about your Care? No.  Actions: * If pain score is 4 or above: No action needed, pain <4.

## 2022-05-22 ENCOUNTER — Telehealth: Payer: Self-pay | Admitting: Pharmacist

## 2022-05-22 NOTE — Telephone Encounter (Signed)
Received notification from [facility] regarding a prior authorization for [place]. Authorization has been APPROVED from 05/22/22 to 05/23/23.   Patient can continue to fill through [place]: [phone]   Authorization # [id number]  Complete [employer] portal updated  Knox Saliva, PharmD, MPH, BCPS, CPP Clinical Pharmacist (Rheumatology and Pulmonology)

## 2022-05-22 NOTE — Telephone Encounter (Signed)
Received fax from [employer] stating patient's PA for Humira is due for renewal.  Submitted to [facility] via CMM  Key: Cecille Aver, PharmD, MPH, BCPS, CPP Clinical Pharmacist (Rheumatology and Pulmonology)

## 2022-06-04 ENCOUNTER — Other Ambulatory Visit: Payer: Self-pay | Admitting: Physician Assistant

## 2022-06-04 DIAGNOSIS — M0579 Rheumatoid arthritis with rheumatoid factor of multiple sites without organ or systems involvement: Secondary | ICD-10-CM

## 2022-06-04 NOTE — Progress Notes (Unsigned)
56 y.o. G2P2 Married Caucasian female here for annual exam.    Was seen over weekend and diagnosed with fluid in ears.  Taking Humira for RA and doing well.  Does most of her lab work with her rheumatologist.   Wants a cholesterol check today.  PCP:  None   Patient's last menstrual period was 11/12/2014.           Sexually active: Yes.    The current method of family planning is status post hysterectomy.    Exercising: Yes.     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 She has never used smokeless tobacco. She reports current alcohol use of about 2.0 standard drinks of alcohol per week. She reports that she does not use drugs.  Past Medical History:  Diagnosis Date   Abnormal uterine bleeding    Allergy    Anemia    Arthritis    Asthma    exercised induced asthma - rarely uses inhaler;none since Chemo.   Blood transfusion without reported diagnosis 1976   1976  following surgery at [facility]) 08/2008   left breast--has bilateral mastectomy   Complication of anesthesia    pt states" gets shakes" with general   Cyst near tailbone    Elevated LDL cholesterol level    Environmental and seasonal allergies    Fibroid    Headache    sleeps , no meds   Heart murmur    never has caused any problems   Helicobacter pylori infection    Hemorrhoids    hx of   History of breast cancer    History of shingles    03/07/18   Hypotension    hx of   Post-operative nausea and vomiting    RA (rheumatoid arthritis) (HCC)    RA (rheumatoid arthritis) ([place])    Scoliosis    tx with robaxin prn - rarely uses   SVD (spontaneous vaginal delivery) 1996   x 1     Past Surgical History:  Procedure Laterality Date   ABDOMINAL HYSTERECTOMY     APPENDECTOMY     BREAST LUMPECTOMY  05/17/2008   x 2 re excise for margin - Dr Margot Chimes, bilateral mastectomy 08/2008   BREAST LUMPECTOMY W/ NEEDLE LOCALIZATION  04/23/2008   w [facility] Dr Margot Chimes   CESAREAN SECTION  01/01/2004   Dr Quincy Simmonds   COLONOSCOPY  03/2018   COLONOSCOPY  05/05/2019   ENDOMETRIAL BIOPSY  06/2013, 2015   x 2   EYE SURGERY  2003   bilateral lasik   FOOT SURGERY     HYSTEROSCOPY WITH D & C  07/21/2011   Procedure: DILATATION AND CURETTAGE (D&C) /HYSTEROSCOPY;  Surgeon: Arloa Koh;  Location: [facility];  Service: Gynecology;  Laterality: N/A;  with Ultrasound Guidance   INSERTION OF TISSUE EXPANDER AFTER MASTECTOMY  09/11/2008   Dr Harlow Mares   KNEE ARTHROSCOPY W/ MENISCAL REPAIR Right 2010   --Dr. Telford Nab   LAPAROSCOPIC ASSISTED VAGINAL HYSTERECTOMY N/A 01/15/2015   Procedure: LAPAROSCOPIC ASSISTED VAGINAL HYSTERECTOMY;  Surgeon: Nunzio Cobbs, MD;  Location: [facility];  Service: Gynecology;  Laterality: N/A;   left foot surgery  2004   left rotator cuff repair  01/2006   MASTECTOMY  09/11/2008   bilateral - Dr Margot Chimes   [facility] REMOVAL  09/09/2009   Dr Margot Chimes   [facility] PLACEMENT  05/17/2008   Dr Cori Razor rounds of chemo   REMOVAL OF TISSUE EXPANDER AND PLACEMENT OF IMPLANT  12/2008   RHINOPLASTY     SALPINGOOPHORECTOMY Bilateral 01/15/2015   Procedure: BILATERAL SALPINGO OOPHORECTOMY;  Surgeon: Nunzio Cobbs, MD;  Location: [facility];  Service: Gynecology;  Laterality: Bilateral;   UMBILICAL HERNIA REPAIR  09/09/2009   Dr Margot Chimes   UPPER GI ENDOSCOPY  03/2018   WISDOM TOOTH EXTRACTION      Current Outpatient Medications  Medication Sig Dispense Refill   calcium carbonate (OS-CAL) 600 MG TABS tablet Take 600 mg by mouth 2 (two) times daily with a meal.     cetirizine-pseudoephedrine (ZYRTEC-D) 5-120 MG tablet Take by mouth.     COLLAGEN PO Take by mouth daily.      FOLIC ACID PO Take by mouth.     GLUCOSAMINE PO Take 2 tablets by mouth daily.     HUMIRA PEN 40 MG/0.4ML PNKT INJECT '40MG'$  SUBCUTANEOUSLY EVERY OTHER WEEK 6 each 0   hydroxychloroquine (PLAQUENIL) 200 MG tablet TAKE 1 TABLET BY MOUTH TWICE DAILY ON MONDAY-FRIDAY 180 tablet 0   ibuprofen (ADVIL) 200 MG tablet Take 200 mg by mouth as needed.     Magnesium 500 MG TABS Take 1 tablet by mouth 2 (two) times daily.     Manganese 50 MG TABS Take 1 tablet by mouth daily.     pantoprazole (PROTONIX) 40 MG tablet Take 1 tablet (40 mg total) by mouth daily. 90 tablet 3   Polyvinyl Alcohol-Povidone (REFRESH OP) Apply to eye.     predniSONE (STERAPRED UNI-PAK 21 TAB) 10 MG (21) TBPK tablet Take by mouth.     promethazine (PHENERGAN) 25 MG tablet Take 25-50 mg by mouth every 8 (eight) hours as needed.     TURMERIC PO Take by mouth daily.     cetirizine (ZYRTEC) 10 MG tablet Take 10 mg by mouth daily. (Patient not taking: Reported on 06/08/2022)     No current facility-administered medications for this visit.    Family History  Problem Relation Age of Onset   Breast cancer Mother    Diabetes Mother    Hypertension Mother    Stroke Mother    Irritable bowel syndrome Mother    Kidney disease Mother    Heart failure Father    Hypertension Father    Colon cancer Neg Hx    Colon polyps Neg Hx    Esophageal cancer Neg Hx    Pancreatic cancer Neg Hx    Rectal cancer Neg Hx    Stomach cancer Neg Hx     Review of Systems  All other systems reviewed and are negative.   Exam:   BP (!) 110/58   Pulse 67   Ht 5' 5.5" (1.664 m)   Wt 161 lb (73 kg)   LMP 11/12/2014   SpO2 93%   BMI 26.38 kg/m     General appearance: alert, cooperative and appears stated age Head: normocephalic, without obvious abnormality, atraumatic Neck: no adenopathy, supple, symmetrical, trachea midline and thyroid normal to inspection and palpation Lungs: clear to auscultation bilaterally Breasts: absent.  Bilateral implants  present. 2 mm sebaceous cyst of the left chest wall skin.  No axillary adenopathy Heart:  regular rate and rhythm Abdomen: soft, non-tender; no masses, no organomegaly Extremities: extremities normal, atraumatic, no cyanosis or edema Skin: skin color, texture, turgor normal. No rashes or lesions Lymph nodes: cervical, supraclavicular, and axillary nodes normal. Neurologic: grossly normal  Pelvic: External genitalia:  no lesions              No abnormal inguinal nodes palpated.              Urethra:  normal appearing urethra with no masses, tenderness or lesions              Bartholins and Skenes: normal                 Vagina: normal appearing vagina with normal color and discharge, no lesions              Cervix:  absent              Pap taken: No. Bimanual Exam:  Uterus:  absent              Adnexa: no mass, fullness, tenderness              Rectal exam: Yes.  .  Confirms.              Anus:  normal sphincter tone, no lesions  Chaperone was present for exam:  Estill Bamberg, CMA  Assessment:   Well woman visit with gynecologic exam. Status post bilateral mastectomy with reconstruction.  Status post LAVH/BSO.  Osteopenia.  RA.  On Humira.  Hx steroid use.  Plan: Pap and HR HPV  not indicated.  Guidelines for Calcium, Vitamin D, regular exercise program including cardiovascular and weight bearing exercise. Plan for next BMD in 2025. Will check cholesterol panel today.   Follow up annually and prn.  QR reader for [facility] to establish care with a PCP.   After visit summary provided.

## 2022-06-04 NOTE — Telephone Encounter (Signed)
Next Visit: 09/08/2022   Last Visit: 03/19/2022   Last Fill: 03/12/2022  DX: Rheumatoid arthritis involving multiple sites with positive rheumatoid factor    Current Dose per office note 03/19/2022: Plaquenil 200 mg 1 tablet by mouth twice daily, Monday through Friday only.  Labs: 03/16/2022 CBC is normal.  CMP shows elevated glucose at 150.   PLQ Eye Exam: 10/08/2021 WNL  Okay to refill PLQ?

## 2022-06-08 ENCOUNTER — Encounter: Payer: Self-pay | Admitting: Obstetrics and Gynecology

## 2022-06-08 ENCOUNTER — Ambulatory Visit (INDEPENDENT_AMBULATORY_CARE_PROVIDER_SITE_OTHER): Payer: 59 | Admitting: Obstetrics and Gynecology

## 2022-06-08 VITALS — BP 110/58 | HR 67 | Ht 65.5 in | Wt 161.0 lb

## 2022-06-08 DIAGNOSIS — Z01419 Encounter for gynecological examination (general) (routine) without abnormal findings: Secondary | ICD-10-CM | POA: Diagnosis not present

## 2022-06-08 DIAGNOSIS — Z0000 Encounter for general adult medical examination without abnormal findings: Secondary | ICD-10-CM

## 2022-06-08 NOTE — Patient Instructions (Signed)

## 2022-06-09 LAB — LIPID PANEL
Cholesterol: 252 mg/dL — ABNORMAL HIGH (ref <200)
HDL: 65 mg/dL (ref >=50)
LDL Cholesterol (Calc): 167 mg/dL (calc) — ABNORMAL HIGH
Non-HDL Cholesterol (Calc): 187 mg/dL (calc) — ABNORMAL HIGH (ref <130)
Total CHOL/HDL Ratio: 3.9 (calc) (ref <5.0)
Triglycerides: 96 mg/dL (ref <150)

## 2022-06-11 ENCOUNTER — Other Ambulatory Visit: Payer: Self-pay | Admitting: Physician Assistant

## 2022-06-11 DIAGNOSIS — M0579 Rheumatoid arthritis with rheumatoid factor of multiple sites without organ or systems involvement: Secondary | ICD-10-CM

## 2022-06-11 NOTE — Telephone Encounter (Signed)
Next Visit: 09/08/2022  Last Visit: 03/19/2022  Last Fill: 03/23/2022  IV:[id number] arthritis involving multiple sites with positive rheumatoid factor   Current Dose per office note 03/19/2022: Humira 40 mg subcutaneous injections every 14 days  Labs: 03/16/2022 CBC is normal.  CMP shows elevated glucose at 150.  Please notify patient.  I will forward results to her PCP.  TB Gold: 11/10/2021   TB Gold is negative.  Okay to refill Humira?

## 2022-06-16 NOTE — Progress Notes (Signed)
Appintment cancelled, Patient not seen or examined.

## 2022-06-26 ENCOUNTER — Ambulatory Visit: Payer: 59 | Admitting: Radiology

## 2022-06-26 VITALS — BP 104/76

## 2022-06-26 DIAGNOSIS — R350 Frequency of micturition: Secondary | ICD-10-CM | POA: Diagnosis not present

## 2022-06-26 MED ORDER — SULFAMETHOXAZOLE-TRIMETHOPRIM 800-160 MG PO TABS: 1.0000 | ORAL_TABLET | Freq: Two times a day (BID) | ORAL | 0 refills | Status: DC

## 2022-06-26 NOTE — Progress Notes (Signed)
SUBJECTIVE: Rebecca Fleming is a 56 y.o. female who complains of  urinary frequency, odor in urine, urgency, lower bilateral back pain, no dysuria. Symptoms x's 3 days  Allergies  Allergen Reactions   Latex Other (See Comments), Shortness Of Breath and Swelling    stuffiness Sinus Congestion; throat swells; "cannot blow up a balloon"   Other Swelling    Patient is allergic to Yellow Dye #5   Yellow Dye Other (See Comments)    Patient is allergic to Yellow Dye #5 Lips Swell   Percocet [Oxycodone-Acetaminophen] Itching    Has had Vicodin- hydrocodone before without itching.    Current Outpatient Medications on File Prior to Visit  Medication Sig Dispense Refill   calcium carbonate (OS-CAL) 600 MG TABS tablet Take 600 mg by mouth 2 (two) times daily with a meal.     COLLAGEN PO Take by mouth daily.     FOLIC ACID PO Take by mouth.     GLUCOSAMINE PO Take 2 tablets by mouth daily.     HUMIRA PEN 40 MG/0.4ML PNKT INJECT '40MG'$  SUBCUTANEOUSLY EVERY OTHER WEEK 2 each 2   hydroxychloroquine (PLAQUENIL) 200 MG tablet TAKE 1 TABLET BY MOUTH TWICE DAILY ON MONDAY-FRIDAY 180 tablet 0   ibuprofen (ADVIL) 200 MG tablet Take 200 mg by mouth as needed.     Magnesium 500 MG TABS Take 1 tablet by mouth 2 (two) times daily.     Manganese 50 MG TABS Take 1 tablet by mouth daily.     pantoprazole (PROTONIX) 40 MG tablet Take 1 tablet (40 mg total) by mouth daily. 90 tablet 3   Polyvinyl Alcohol-Povidone (REFRESH OP) Apply to eye.     promethazine (PHENERGAN) 25 MG tablet Take 25-50 mg by mouth every 8 (eight) hours as needed.     Pseudoephedrine-Ibuprofen 30-200 MG TABS Take by mouth.     TURMERIC PO Take by mouth daily.     cetirizine (ZYRTEC) 10 MG tablet Take 10 mg by mouth daily. (Patient not taking: Reported on 06/26/2022)     No current facility-administered medications on file prior to visit.    Past Medical History:  Diagnosis Date   Abnormal uterine bleeding    Allergy    Anemia     Arthritis    Asthma    exercised induced asthma - rarely uses inhaler;none since Chemo.   Blood transfusion without reported diagnosis 1976   1976  following surgery at [facility]) 08/2008   left breast--has bilateral mastectomy   Complication of anesthesia    pt states" gets shakes" with general   Cyst near tailbone    Elevated LDL cholesterol level    Environmental and seasonal allergies    Fibroid    Headache    sleeps , no meds   Heart murmur    never has caused any problems   Helicobacter pylori infection    Hemorrhoids    hx of   History of breast cancer    History of shingles    03/07/18   Hypotension    hx of   Post-operative nausea and vomiting    RA (rheumatoid arthritis) (HCC)    RA (rheumatoid arthritis) ([place])    Scoliosis    tx with robaxin prn - rarely uses   SVD (spontaneous vaginal delivery) 1996   x 1     OBJECTIVE: Appears well, in no apparent distress.  Vital signs are normal. The abdomen is soft  without tenderness, guarding, mass, rebound or organomegaly. No CVA tenderness or inguinal adenopathy noted. Urine dipstick shows positive for leukocytes.  Micro exam:  WBC's per HPF and + bacteria.    Blood pressure 104/76, last menstrual period 11/12/2014.    Chaperone offered and declined for exam.  ASSESSMENT/PLAN: 1. Urinary frequency  - Urinalysis,Complete w/RFL Culture - sulfamethoxazole-trimethoprim (BACTRIM DS) 800-160 MG tablet; Take 1 tablet by mouth 2 (two) times daily.  Dispense: 10 tablet; Refill: 0    Will send urine culture and sensitivity.  Treatment per orders - also push fluids, avoid bladder irritants. Instructed she may use Pyridium OTC prn. Call or return to clinic prn if these symptoms worsen or fail to improve as anticipated. Pyelo precautions reviewed with patient.   '@SIGNATURE'$ @

## 2022-06-30 ENCOUNTER — Other Ambulatory Visit: Payer: Self-pay | Admitting: Radiology

## 2022-06-30 MED ORDER — AMOXICILLIN-POT CLAVULANATE 875-125 MG PO TABS: 1.0000 | ORAL_TABLET | Freq: Two times a day (BID) | ORAL | 0 refills | Status: DC

## 2022-06-30 NOTE — Progress Notes (Signed)
Will send new rx for Augmentin, resistant to Bactrim, Macrobid allergy (yellow dye)

## 2022-07-13 ENCOUNTER — Ambulatory Visit (INDEPENDENT_AMBULATORY_CARE_PROVIDER_SITE_OTHER): Payer: 59 | Admitting: Nurse Practitioner

## 2022-07-13 VITALS — BP 94/72 | HR 73

## 2022-07-13 DIAGNOSIS — R300 Dysuria: Secondary | ICD-10-CM | POA: Diagnosis not present

## 2022-07-13 DIAGNOSIS — N3000 Acute cystitis without hematuria: Secondary | ICD-10-CM

## 2022-07-13 MED ORDER — AMOXICILLIN-POT CLAVULANATE 500-125 MG PO TABS: 1.0000 | ORAL_TABLET | Freq: Two times a day (BID) | ORAL | 0 refills | Status: AC

## 2022-07-13 NOTE — Progress Notes (Signed)
   Acute Office Visit  Subjective:    Patient ID: Rebecca Fleming, female    DOB: 1965-11-04, 56 y.o.   MRN: [id number]   HPI 56 y.o. presents today for urinary frequency, urgency and dysuria. Treated for E.Coli + UTI 06/30/22. Symptoms resolved for about a week but then returned. Taking AZO. Denies vaginal symptoms.    Review of Systems  Constitutional: Negative.   Genitourinary:  Positive for dysuria, flank pain, frequency and urgency. Negative for difficulty urinating, hematuria, vaginal discharge and vaginal pain.       Objective:    Physical Exam Constitutional:      Appearance: Normal appearance.   GU: Not indicated  BP 94/72   Pulse 73   LMP 11/12/2014   SpO2 96%  Wt Readings from Last 3 Encounters:  06/08/22 161 lb (73 kg)  04/29/22 159 lb (72.1 kg)  03/26/22 159 lb 6 oz (72.3 kg)        Microscopic only due to AZO use: wbc 10-20, rbc -0-2, many bacteria  Assessment & Plan:   Problem List Items Addressed This Visit   None Visit Diagnoses     Acute cystitis without hematuria    -  Primary   Relevant Medications   amoxicillin-clavulanate (AUGMENTIN) 500-125 MG tablet   Dysuria       Relevant Orders   Urinalysis,Complete w/RFL Culture      Plan: Limited choice of antibiotics due to allergy to yellow dyes (swelling of lips). Augmentin 500-125 mg BID x 7 days. Culture pending.      Tamela Gammon DNP, 11:32 AM 07/13/2022

## 2022-07-16 LAB — URINALYSIS, COMPLETE W/RFL CULTURE
Bilirubin Urine: NEGATIVE
Glucose, UA: NEGATIVE
Hgb urine dipstick: NEGATIVE
Hyaline Cast: NONE SEEN /LPF
Ketones, ur: NEGATIVE
Nitrites, Initial: NEGATIVE
Protein, ur: NEGATIVE
Specific Gravity, Urine: 1.02 (ref 1.001–1.035)
pH: 5.5 (ref 5.0–8.0)

## 2022-07-16 LAB — URINE CULTURE
MICRO NUMBER:: 14018536
SPECIMEN QUALITY:: ADEQUATE

## 2022-07-16 LAB — CULTURE INDICATED

## 2022-07-20 ENCOUNTER — Telehealth: Payer: Self-pay | Admitting: Obstetrics and Gynecology

## 2022-07-20 LAB — CULTURE INDICATED

## 2022-07-20 LAB — URINALYSIS, COMPLETE W/RFL CULTURE: Hyaline Cast: NONE SEEN /LPF

## 2022-07-20 LAB — URINE CULTURE

## 2022-07-20 NOTE — Telephone Encounter (Signed)
Spoke with lab to into result not being viewable in epic.

## 2022-07-20 NOTE — Telephone Encounter (Signed)
Please contact patient in follow up to her repeat treatment for UTI.  I recommend a follow up visit with me if her symptoms are not resolved.

## 2022-07-20 NOTE — Telephone Encounter (Signed)
Please check on the status of the patient's urinalysis and culture from 07/13/22.

## 2022-07-21 NOTE — Telephone Encounter (Signed)
Encounter reviewed and closed.  

## 2022-07-21 NOTE — Telephone Encounter (Signed)
Patient said she feels better, but notice small amount of burning. She reports going to monitor another day to see if it resolves, if not she is aware to schedule a visit.

## 2022-08-24 ENCOUNTER — Telehealth: Payer: Self-pay

## 2022-08-24 NOTE — Telephone Encounter (Signed)
Received a call from patient stating that she went to the urgent care last week and they dx her with bacterial bronchitis and prescribed her some antibiotics. She stated that she is still not feeling well and she did not take her Humira last week and wanted to know if she needs to take it this week.   Per Jeannett Senior, patient needs to wait until infection is clear to start back Humira and needs to follow up with PCP or urgent care to see if she needs a different antibiotic. Relayed this to patient she verbalized her understanding and had no further questions.

## 2022-08-26 NOTE — Progress Notes (Signed)
Office Visit Note  Patient: Rebecca Fleming             Date of Birth: Apr 05, 1966           MRN: [id number]             PCP: Nunzio Cobbs, MD Referring: Aundria Rud* Visit Date: 09/08/2022 Occupation: '@GUAROCC'$ @  Subjective:  Medication management  History of Present Illness: Rebecca Fleming is a 56 y.o. female history of seropositive rheumatoid arthritis and psoriasis.  She states she had been sick since the beginning of November.  She had to come off Humira in mid November.  She has not resumed Humira yet.  She states she was initially treated with doxycycline followed by Augmentin.  She was also given a prednisone taper for upper respiratory tract infection.  Her symptoms are gradually resolving.  She has not had any fever she has some residual cough.  She plans to take Humira injection this week.  She has not had a flare of rheumatoid arthritis or psoriasis.  Activities of Daily Living:  Patient reports morning stiffness for 0 minutes.   Patient Denies nocturnal pain.  Difficulty dressing/grooming: Denies Difficulty climbing stairs: Denies Difficulty getting out of chair: Denies Difficulty using hands for taps, buttons, cutlery, and/or writing: Reports  Review of Systems  Constitutional:  Positive for fatigue.  HENT:  Negative for mouth sores and mouth dryness.   Eyes:  Negative for dryness.  Respiratory:  Positive for shortness of breath.   Cardiovascular:  Negative for chest pain and palpitations.  Gastrointestinal:  Negative for blood in stool, constipation and diarrhea.  Endocrine: Negative for increased urination.  Genitourinary:  Negative for involuntary urination.  Musculoskeletal:  Positive for myalgias, muscle tenderness and myalgias. Negative for joint pain, gait problem, joint pain, joint swelling, muscle weakness and morning stiffness.  Skin:  Negative for color change, rash, hair loss and sensitivity to sunlight.  Allergic/Immunologic:  Negative for susceptible to infections.  Neurological:  Negative for dizziness and headaches.  Hematological:  Negative for swollen glands.  Psychiatric/Behavioral:  Negative for depressed mood and sleep disturbance. The patient is not nervous/anxious.     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   Blood transfusion without reported diagnosis 1976   1976  following surgery at [facility]) 08/2008   left breast--has bilateral mastectomy   Complication of anesthesia    pt states" gets shakes" with general   Cyst near tailbone    Elevated LDL cholesterol level    Environmental and seasonal allergies    Fibroid    Headache    sleeps , no meds   Heart murmur  never has caused any problems   Helicobacter pylori infection    Hemorrhoids    hx of   History of breast cancer    History of shingles    03/07/18   Hypotension    hx of   Post-operative nausea and vomiting    RA (rheumatoid arthritis) (HCC)    RA (rheumatoid arthritis) (HCC)    Scoliosis    tx with robaxin prn - rarely uses   SVD (spontaneous vaginal delivery) 1996   x 1    Family History  Problem Relation Age of Onset   Breast cancer Mother     Diabetes Mother    Hypertension Mother    Stroke Mother    Irritable bowel syndrome Mother    Kidney disease Mother    Heart failure Father    Hypertension Father    Colon cancer Neg Hx    Colon polyps Neg Hx    Esophageal cancer Neg Hx    Pancreatic cancer Neg Hx    Rectal cancer Neg Hx    Stomach cancer Neg Hx    Past Surgical History:  Procedure Laterality Date   ABDOMINAL HYSTERECTOMY     APPENDECTOMY     BREAST LUMPECTOMY  05/17/2008   x 2 re excise for margin - Dr Margot Chimes, bilateral mastectomy 08/2008   BREAST LUMPECTOMY W/ NEEDLE LOCALIZATION  04/23/2008   w [facility] Dr Margot Chimes   CESAREAN SECTION  01/01/2004   Dr Quincy Simmonds   COLONOSCOPY  03/2018   COLONOSCOPY  05/05/2019   ENDOMETRIAL BIOPSY  06/2013, 2015   x 2   EYE SURGERY  2003   bilateral lasik   FOOT SURGERY     HYSTEROSCOPY WITH D & C  07/21/2011   Procedure: DILATATION AND CURETTAGE (D&C) /HYSTEROSCOPY;  Surgeon: Arloa Koh;  Location: [facility];  Service: Gynecology;  Laterality: N/A;  with Ultrasound Guidance   INSERTION OF TISSUE EXPANDER AFTER MASTECTOMY  09/11/2008   Dr Harlow Mares   KNEE ARTHROSCOPY W/ MENISCAL REPAIR Right 2010   --Dr. Telford Nab   LAPAROSCOPIC ASSISTED VAGINAL HYSTERECTOMY N/A 01/15/2015   Procedure: LAPAROSCOPIC ASSISTED VAGINAL HYSTERECTOMY;  Surgeon: Nunzio Cobbs, MD;  Location: [facility];  Service: Gynecology;  Laterality: N/A;   left foot surgery  2004   left rotator cuff repair  01/2006   MASTECTOMY  09/11/2008   bilateral - Dr Margot Chimes   [facility] REMOVAL  09/09/2009   Dr Margot Chimes   [facility] PLACEMENT  05/17/2008   Dr Cori Razor rounds of chemo   REMOVAL OF TISSUE EXPANDER AND PLACEMENT OF IMPLANT  12/2008   RHINOPLASTY     SALPINGOOPHORECTOMY Bilateral 01/15/2015   Procedure: BILATERAL SALPINGO OOPHORECTOMY;  Surgeon: Nunzio Cobbs, MD;  Location: [facility];  Service: Gynecology;  Laterality: Bilateral;   UMBILICAL HERNIA REPAIR  09/09/2009   Dr Margot Chimes   UPPER GI ENDOSCOPY   03/2018   WISDOM TOOTH EXTRACTION     Social History   Social History Narrative   Right Handed    Lives in a two story home. Main living on first floor.   Immunization History  Administered Date(s) Administered   Influenza,inj,Quad PF,6+ Mos 07/12/2019   PFIZER Comirnaty(Gray Top)Covid-19 Tri-[place] Vaccine 10/05/2019, 10/25/2019, 05/11/2020   PFIZER(Purple Top)SARS-COV-2 Vaccination 10/05/2019, 10/25/2019, 05/11/2020   Pneumococcal Polysaccharide-23 01/16/2015   Tdap 09/21/2010, 07/12/2019     Objective: Vital Signs: BP 119/76 (BP Location: Right Arm, Patient Position: Sitting, Cuff Size: Normal)   Pulse 75  Resp 16   Ht '5\' 5"'$  (1.651 m)   Wt 160 lb 6.4 oz (72.8 kg)   LMP 11/12/2014   BMI 26.69 kg/m    Physical Exam Vitals and nursing note reviewed.  Constitutional:      Appearance: She is well-developed.  HENT:     Head: Normocephalic and atraumatic.  Eyes:     Conjunctiva/sclera: Conjunctivae normal.  Cardiovascular:     Rate and Rhythm: Normal rate and regular rhythm.     Heart sounds: Normal heart sounds.  Pulmonary:     Effort: Pulmonary effort is normal.     Breath sounds: Normal breath sounds.  Abdominal:     General: Bowel sounds are normal.     Palpations: Abdomen is soft.  Musculoskeletal:     Cervical back: Normal range of motion.  Lymphadenopathy:     Cervical: No cervical adenopathy.  Skin:    General: Skin is warm and dry.     Capillary Refill: Capillary refill takes less than 2 seconds.  Neurological:     Mental Status: She is alert and oriented to person, place, and time.  Psychiatric:        Behavior: Behavior normal.      Musculoskeletal Exam: Cervical spine was in good range of motion.  She had good range of motion of thoracic and lumbar spine.  Shoulder joints, elbow joints, wrist joints, MCPs PIPs and DIPs been good range of motion with no synovitis.  Hip joints, knee joints with good range of motion without any warmth swelling or  effusion.  There was no tenderness over ankles or MTPs.  CDAI Exam: CDAI Score: -- Patient Global: 1 mm; Provider Global: 1 mm Swollen: --; Tender: -- Joint Exam 09/08/2022   No joint exam has been documented for this visit   There is currently no information documented on the homunculus. Go to the Rheumatology activity and complete the homunculus joint exam.  Investigation: No additional findings.  Imaging: No results found.  Recent Labs: Lab Results  Component Value Date   WBC 6.4 03/16/2022   HGB 14.2 03/16/2022   PLT 188 03/16/2022   NA 139 03/16/2022   K 4.7 03/16/2022   CL 103 03/16/2022   CO2 28 03/16/2022   GLUCOSE 150 (H) 03/16/2022   BUN 15 03/16/2022   CREATININE 0.68 03/16/2022   BILITOT 0.6 03/16/2022   ALKPHOS 66 03/31/2017   AST 15 03/16/2022   ALT 15 03/16/2022   PROT 6.4 03/16/2022   ALBUMIN 4.1 03/31/2017   CALCIUM 10.0 03/16/2022   GFRAA 105 02/24/2021   QFTBGOLDPLUS NEGATIVE 11/10/2021     Speciality Comments: PLQ Eye Exam: 10/08/2021 WNL @ [place] [phone] . Follow up in 1 year. Humira 09/22    Procedures:  No procedures performed Allergies: Latex, Other, Yellow dye, and Percocet [oxycodone-acetaminophen]   Assessment / Plan:     Visit Diagnoses: Rheumatoid arthritis involving multiple sites with positive rheumatoid factor ([place]) - +RF, +CCP: Patient denies any joint pain or joint swelling.  No synovitis was noted.  She has been off Humira for almost a month due to upper respiratory tract infection.  She plans to resume Humira this week.  She was treated with doxycycline and later followed by Augmentin.  She was also given a prednisone taper for congestion.  She finished prednisone 2 days ago.  She denies any history of fever.  She states she still has some residual cough.  High risk medication use -  Humira 40 mg subcutaneous injections every 14 days and Plaquenil 200 mg 1 tablet by mouth twice  daily, Monday through Friday only.PLQ Eye Exam: 10/08/2021 - August 25, 2022 CBC with differential normal, and CMP normal.Labs were reviewed with the patient.  She was advised to get repeat labs in March with TB Gold.  Patient states she had her skin examination yesterday to screen for skin cancer which was normal.  Annual skin examination and sun protection was discussed.  She was also advised to hold Humira if she develops an infection and resume after the infection resolves.  Information regarding immunization was placed in the AVS.  Plan: QuantiFERON-TB Gold Plus  Primary osteoarthritis of both hands-she has mild PIP and DIP thickening.  Joint protection was discussed.  Dupuytren's contracture of right hand  Primary osteoarthritis of both knees-she had no warmth swelling or effusion.  She denies any discomfort today.  Chondromalacia of left patella-she has intermittent discomfort.  No warmth swelling or effusion was noted.  She exercises on a regular basis.  DDD (degenerative disc disease), cervical-she had good range of motion without discomfort.  Psoriasis-she had no active lesions of psoriasis today.  She has not had a psoriasis flare in a while.  Osteopenia of multiple sites - DEXA updated on 11/20/21: The BMD measured at Femur Neck Right is 0.798 g/cm2 with a T-score of -1.7.  Use of calcium rich diet with vitamin D was discussed.  She has been exercising.  Other medical problems listed as follows:  History of breast cancer  Heart murmur  History of asthma  History of anemia  Orders: Orders Placed This Encounter  Procedures   QuantiFERON-TB Gold Plus   No orders of the defined types were placed in this encounter.    Follow-Up Instructions: Return in about 5 months (around 02/07/2023) for Rheumatoid arthritis, Osteoarthritis.   Bo Merino, MD  Note - This record has been created using Editor, commissioning.  Chart creation errors have been sought, but may not always   have been located. Such creation errors do not reflect on  the standard of medical care.

## 2022-09-02 ENCOUNTER — Other Ambulatory Visit: Payer: Self-pay | Admitting: Physician Assistant

## 2022-09-02 DIAGNOSIS — M0579 Rheumatoid arthritis with rheumatoid factor of multiple sites without organ or systems involvement: Secondary | ICD-10-CM

## 2022-09-02 NOTE — Telephone Encounter (Signed)
Next Visit: 09/08/2022   Last Visit: 03/19/2022   Last Fill: 06/04/2022   DX: Rheumatoid arthritis involving multiple sites with positive rheumatoid factor    Current Dose per office note 03/19/2022: Plaquenil 200 mg 1 tablet by mouth twice daily, Monday through Friday only.   Labs:08/25/2022 CO2 31    PLQ Eye Exam: 10/08/2021 WNL   Okay to refill PLQ?

## 2022-09-08 ENCOUNTER — Ambulatory Visit: Payer: No Typology Code available for payment source | Attending: Rheumatology | Admitting: Rheumatology

## 2022-09-08 ENCOUNTER — Other Ambulatory Visit: Payer: Self-pay | Admitting: Rheumatology

## 2022-09-08 ENCOUNTER — Encounter: Payer: Self-pay | Admitting: Rheumatology

## 2022-09-08 VITALS — BP 119/76 | HR 75 | Resp 16 | Ht 65.0 in | Wt 160.4 lb

## 2022-09-08 DIAGNOSIS — Z8709 Personal history of other diseases of the respiratory system: Secondary | ICD-10-CM

## 2022-09-08 DIAGNOSIS — M19041 Primary osteoarthritis, right hand: Secondary | ICD-10-CM

## 2022-09-08 DIAGNOSIS — M720 Palmar fascial fibromatosis [Dupuytren]: Secondary | ICD-10-CM

## 2022-09-08 DIAGNOSIS — Z862 Personal history of diseases of the blood and blood-forming organs and certain disorders involving the immune mechanism: Secondary | ICD-10-CM

## 2022-09-08 DIAGNOSIS — M19042 Primary osteoarthritis, left hand: Secondary | ICD-10-CM

## 2022-09-08 DIAGNOSIS — M170 Bilateral primary osteoarthritis of knee: Secondary | ICD-10-CM

## 2022-09-08 DIAGNOSIS — M0579 Rheumatoid arthritis with rheumatoid factor of multiple sites without organ or systems involvement: Secondary | ICD-10-CM

## 2022-09-08 DIAGNOSIS — M8589 Other specified disorders of bone density and structure, multiple sites: Secondary | ICD-10-CM

## 2022-09-08 DIAGNOSIS — Z853 Personal history of malignant neoplasm of breast: Secondary | ICD-10-CM

## 2022-09-08 DIAGNOSIS — R011 Cardiac murmur, unspecified: Secondary | ICD-10-CM

## 2022-09-08 DIAGNOSIS — Z79899 Other long term (current) drug therapy: Secondary | ICD-10-CM

## 2022-09-08 DIAGNOSIS — L409 Psoriasis, unspecified: Secondary | ICD-10-CM

## 2022-09-08 DIAGNOSIS — M5030 Other cervical disc degeneration, unspecified cervical region: Secondary | ICD-10-CM

## 2022-09-08 DIAGNOSIS — M2242 Chondromalacia patellae, left knee: Secondary | ICD-10-CM

## 2022-09-08 NOTE — Telephone Encounter (Signed)
Next Visit: 09/08/2022   Last Visit: 03/19/2022   Last Fill: 06/11/2022   [id number] arthritis involving multiple sites with positive rheumatoid factor    Current Dose per office note 03/19/2022: Humira 40 mg subcutaneous injections every 14 days   Labs: 08/25/2022 CO2 31   TB Gold: 11/10/2021   TB Gold is negative.   Okay to refill Humira?

## 2022-09-08 NOTE — Patient Instructions (Addendum)
Standing Labs We placed an order today for your standing lab work.   Please have your standing labs drawn in March and every 3 months  TB Gold with your next lab  Please have your labs drawn 2 weeks prior to your appointment so that the provider can discuss your lab results at your appointment.  Please note that you may see your imaging and lab results in [place] before we have reviewed them. We will contact you once all results are reviewed. Please allow our office up to 72 hours to thoroughly review all of the results before contacting the office for clarification of your results.  Lab hours are:   Monday through Thursday from 8:00 am -12:30 pm and 1:00 pm-5:00 pm and Friday from 8:00 am-12:00 pm.  Please be advised, all patients with office appointments requiring lab work will take precedent over walk-in lab work.   Labs are drawn by [facility]. Please bring your co-pay at the time of your lab draw.  You may receive a bill from [facility] for your lab work.  Please note if you are on Hydroxychloroquine and and an order has been placed for a Hydroxychloroquine level, you will need to have it drawn 4 hours or more after your last dose.  If you wish to have your labs drawn at another location, please call the office 24 hours in advance so we can fax the orders.  The office is located at [address] [place], [place], [place] [address] No appointment is necessary.    If you have any questions regarding directions or hours of operation,  please call [phone].   As a reminder, please drink plenty of water prior to coming for your lab work. Thanks!   Vaccines You are taking a medication(s) that can suppress your immune system.  The following immunizations are recommended: Flu annually Covid-19  Td/Tdap (tetanus, diphtheria, pertussis) every 10 years Pneumonia (Prevnar 15 then Pneumovax 23 at least 1 year apart.  Alternatively, can take Prevnar 20 without needing additional  dose) Shingrix: 2 doses from 4 weeks to 6 months apart  Please check with your PCP to make sure you are up to date.   If you have signs or symptoms of an infection or start antibiotics: First, call your PCP for workup of your infection. Hold your medication through the infection, until you complete your antibiotics, and until symptoms resolve if you take the following: Injectable medication (Actemra, Benlysta, Cimzia, Cosentyx, Enbrel, Humira, Kevzara, Orencia, Remicade, Simponi, Stelara, Taltz, Tremfya) Methotrexate Leflunomide (Arava) Mycophenolate (Cellcept) Roma Kayser, or Rinvoq  Please get an annual skin examination by your dermatologist to screen for skin cancer while you are on Humira.

## 2022-09-16 ENCOUNTER — Ambulatory Visit: Admit: 2022-09-16 | Discharge status: Absent without leave | Payer: No Typology Code available for payment source

## 2022-09-16 ENCOUNTER — Ambulatory Visit
Admission: EM | Admit: 2022-09-16 | Discharge: 2022-09-16 | Disposition: A | Discharge status: Home / Self Care | Payer: No Typology Code available for payment source | Attending: Family Medicine | Admitting: Family Medicine

## 2022-09-16 DIAGNOSIS — B349 Viral infection, unspecified: Secondary | ICD-10-CM | POA: Diagnosis not present

## 2022-09-16 DIAGNOSIS — B001 Herpesviral vesicular dermatitis: Secondary | ICD-10-CM | POA: Diagnosis not present

## 2022-09-16 DIAGNOSIS — R0789 Other chest pain: Secondary | ICD-10-CM

## 2022-09-16 MED ORDER — PREDNISONE 20 MG PO TABS: 40.0000 mg | ORAL_TABLET | Freq: Every day | ORAL | 0 refills | Status: DC

## 2022-09-16 NOTE — ED Triage Notes (Signed)
Pt presents with c/o left rib pain following an upper respiratory infection for "a few days"

## 2022-09-16 NOTE — Discharge Instructions (Addendum)
You have inflammation in the ribs and chest wall from your coughing. Your lung exam and heart exam are normal Take the prednisone daily for 5 days After this would take ibuprofen as needed for pain

## 2022-09-16 NOTE — ED Provider Notes (Signed)
[facility]    CSN: [id number] Arrival date & time: 09/16/22  1301      History   Chief Complaint Chief Complaint  Patient presents with   Chest Pain    left    HPI Rebecca Fleming is a 56 y.o. female.   HPI  Patient states she has had an upper respiratory infection for over a week.  She has a cough.  The cough is persisting.  She states he is here because she has developed some left chest pain with deep breath and with cough.  She knows that left chest pain can be cardiac and this worries her.  Her pain is reproducible with pressure on her chest.  She feels like it is likely her ribs.  No fever or chills.  No sputum production  Past Medical History:  Diagnosis Date   Abnormal uterine bleeding    Allergy    Anemia    Arthritis    Asthma    exercised induced asthma - rarely uses inhaler;none since Chemo.   Blood transfusion without reported diagnosis 1976   1976  following surgery at [facility]) 08/2008   left breast--has bilateral mastectomy   Complication of anesthesia    pt states" gets shakes" with general   Cyst near tailbone    Elevated LDL cholesterol level    Environmental and seasonal allergies    Fibroid    Headache    sleeps , no meds   Heart murmur    never has caused any problems   Helicobacter pylori infection    Hemorrhoids    hx of   History of breast cancer    History of shingles    03/07/18   Hypotension    hx of   Post-operative nausea and vomiting    RA (rheumatoid arthritis) (HCC)    RA (rheumatoid arthritis) ([place])    Scoliosis    tx with robaxin prn - rarely uses   SVD (spontaneous vaginal delivery) 1996   x 1    Patient Active Problem List   Diagnosis Date Noted   Primary osteoarthritis of both knees 09/01/2017   Scoliosis 09/01/2017   High risk medication use 11/11/2016   Primary osteoarthritis of both hands 11/11/2016   Lateral epicondylitis, right elbow 11/11/2016   Myopia with astigmatism and presbyopia,  bilateral 09/09/2016   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 09/09/2016   Status post laparoscopic assisted vaginal hysterectomy (LAVH) 01/15/2015   Breast cancer, left breast ([place]) 11/07/2014   Postmenopausal bleeding 07/13/2013   Rheumatoid arthritis ([place]) 01/11/2012   Psoriasis 01/11/2012    Past Surgical History:  Procedure Laterality Date   ABDOMINAL HYSTERECTOMY     APPENDECTOMY     BREAST LUMPECTOMY  05/17/2008   x 2 re excise for margin - Dr Margot Chimes, bilateral mastectomy 08/2008   BREAST LUMPECTOMY W/ NEEDLE LOCALIZATION  04/23/2008   w [facility] Dr Margot Chimes   CESAREAN SECTION  01/01/2004   Dr Quincy Simmonds   COLONOSCOPY  03/2018   COLONOSCOPY  05/05/2019   ENDOMETRIAL BIOPSY  06/2013, 2015   x 2   EYE SURGERY  2003   bilateral lasik   FOOT SURGERY     HYSTEROSCOPY WITH D & C  07/21/2011   Procedure: DILATATION AND CURETTAGE (D&C) /HYSTEROSCOPY;  Surgeon: Arloa Koh;  Location: [facility];  Service: Gynecology;  Laterality: N/A;  with Ultrasound Guidance   INSERTION OF TISSUE EXPANDER AFTER MASTECTOMY  09/11/2008  Dr Harlow Mares   KNEE ARTHROSCOPY W/ MENISCAL REPAIR Right 2010   --Dr. Telford Nab   LAPAROSCOPIC ASSISTED VAGINAL HYSTERECTOMY N/A 01/15/2015   Procedure: LAPAROSCOPIC ASSISTED VAGINAL HYSTERECTOMY;  Surgeon: Nunzio Cobbs, MD;  Location: [place] [facility];  Service: Gynecology;  Laterality: N/A;   left foot surgery  2004   left rotator cuff repair  01/2006   MASTECTOMY  09/11/2008   bilateral - Dr Margot Chimes   [facility] REMOVAL  09/09/2009   Dr Margot Chimes   [facility] PLACEMENT  05/17/2008   Dr Cori Razor rounds of chemo   REMOVAL OF TISSUE EXPANDER AND PLACEMENT OF IMPLANT  12/2008   RHINOPLASTY     SALPINGOOPHORECTOMY Bilateral 01/15/2015   Procedure: BILATERAL SALPINGO OOPHORECTOMY;  Surgeon: Nunzio Cobbs, MD;  Location: [facility] [facility];  Service: Gynecology;  Laterality: Bilateral;   UMBILICAL HERNIA REPAIR  09/09/2009   Dr Margot Chimes   UPPER GI ENDOSCOPY  03/2018   WISDOM  TOOTH EXTRACTION      OB History     Gravida  2   Para  2   Term      Preterm      AB      Living  3      SAB      IAB      Ectopic      Multiple  1   Live Births  3        Obstetric Comments  Pt was a surrogate for the twins          Home Medications    Prior to Admission medications   Medication Sig Start Date End Date Taking? Authorizing Provider  fluticasone (FLONASE) 50 MCG/ACT nasal spray Place into both nostrils daily.   Yes [provider]  predniSONE (DELTASONE) 20 MG tablet Take 2 tablets (40 mg total) by mouth daily with breakfast. 09/16/22  Yes Raylene Everts, MD  calcium carbonate (OS-CAL) 600 MG TABS tablet Take 600 mg by mouth 2 (two) times daily with a meal.    [provider]  cetirizine (ZYRTEC) 10 MG tablet Take 10 mg by mouth daily.    [provider]  FOLIC ACID PO Take by mouth.    [provider]  GLUCOSAMINE PO Take 2 tablets by mouth daily.    [provider]  hydroxychloroquine (PLAQUENIL) 200 MG tablet TAKE 1 TABLET BY MOUTH TWICE DAILY ON MONDAY-FRIDAY 09/02/22   Ofilia Neas, PA-C  ibuprofen (ADVIL) 200 MG tablet Take 200 mg by mouth as needed.    [provider]  Magnesium 500 MG TABS Take 1 tablet by mouth 2 (two) times daily.    [provider]  Manganese 50 MG TABS Take 1 tablet by mouth daily.    [provider]  pantoprazole (PROTONIX) 40 MG tablet Take 1 tablet (40 mg total) by mouth daily. 03/26/22   Noralyn Pick, NP  Polyvinyl Alcohol-Povidone (REFRESH OP) Apply to eye.    [provider]  promethazine (PHENERGAN) 25 MG tablet Take 25-50 mg by mouth every 8 (eight) hours as needed. 06/06/22   [provider]  TURMERIC PO Take by mouth daily.    [provider]    Family History Family History  Problem Relation Age of Onset   Breast cancer Mother    Diabetes Mother    Hypertension Mother    Stroke Mother     Irritable bowel syndrome Mother    Kidney disease Mother  Heart failure Father    Hypertension Father    Colon cancer Neg Hx    Colon polyps Neg Hx    Esophageal cancer Neg Hx    Pancreatic cancer Neg Hx    Rectal cancer Neg Hx    Stomach cancer Neg Hx     Social History Social History   Tobacco Use   Smoking status: Never    Passive exposure: Past   Smokeless tobacco: Never  Vaping Use   Vaping Use: Never used  Substance Use Topics   Alcohol use: Yes    Alcohol/week: 2.0 standard drinks of alcohol    Types: 2 Cans of beer per week    Comment: occ glass of wine or beer   Drug use: No     Allergies   Latex, Yellow dye, and Percocet [oxycodone-acetaminophen]   Review of Systems Review of Systems See HPI  Physical Exam Triage Vital Signs ED Triage Vitals  Enc Vitals Group     BP 09/16/22 1440 114/79     Pulse Rate 09/16/22 1440 69     Resp 09/16/22 1440 12     Temp 09/16/22 1440 98.3 F (36.8 C)     Temp Source 09/16/22 1440 Oral     SpO2 09/16/22 1440 96 %     Weight --      Height --      Head Circumference --      Peak Flow --      Pain Score 09/16/22 1439 2     Pain Loc --      Pain Edu? --      Excl. in [place]? --    No data found.  Updated Vital Signs BP 114/79 (BP Location: Right Arm)   Pulse 69   Temp 98.3 F (36.8 C) (Oral)   Resp 12   LMP 11/12/2014   SpO2 96%      Physical Exam Constitutional:      General: She is not in acute distress.    Appearance: She is well-developed. She is not ill-appearing.  HENT:     Head: Normocephalic and atraumatic.     Right Ear: Tympanic membrane and ear canal normal.     Left Ear: Tympanic membrane and ear canal normal.     Nose: Nose normal. No congestion.     Mouth/Throat:     Mouth: Mucous membranes are moist.     Comments: Multiple cold sores on lower lip at [place] Eyes:     Conjunctiva/sclera: Conjunctivae normal.     Pupils: Pupils are equal, round, and reactive to light.   Cardiovascular:     Rate and Rhythm: Normal rate and regular rhythm.     Heart sounds: Normal heart sounds.  Pulmonary:     Effort: Pulmonary effort is normal. No respiratory distress.     Breath sounds: Normal breath sounds.     Comments: Point chest wall tenderness under left breast.  No crepitus.  No deformity Chest:     Chest wall: Tenderness present.  Abdominal:     General: There is no distension.     Palpations: Abdomen is soft.  Musculoskeletal:        General: Normal range of motion.     Cervical back: Normal range of motion.  Skin:    General: Skin is warm and dry.  Neurological:     Mental Status: She is alert.      UC Treatments / Results  Labs (all labs ordered are  listed, but only abnormal results are displayed) Labs Reviewed - No data to display  EKG   Radiology No results found.  Procedures Procedures (including critical care time)  Medications Ordered in UC Medications - No data to display  Initial Impression / Assessment and Plan / UC Course  I have reviewed the triage vital signs and the nursing notes.  Pertinent labs & imaging results that were available during my care of the patient were reviewed by me and considered in my medical decision making (see chart for details).      Final Clinical Impressions(s) / UC Diagnoses   Final diagnoses:  Chest wall pain  Viral illness  Recurrent herpes labialis     Discharge Instructions      You have inflammation in the ribs and chest wall from your coughing. Your lung exam and heart exam are normal Take the prednisone daily for 5 days After this would take ibuprofen as needed for pain    ED Prescriptions     Medication Sig Dispense Auth. Provider   predniSONE (DELTASONE) 20 MG tablet Take 2 tablets (40 mg total) by mouth daily with breakfast. 10 tablet Raylene Everts, MD      PDMP not reviewed this encounter.   Raylene Everts, MD 09/16/22 Bosie Helper

## 2022-09-17 ENCOUNTER — Telehealth: Payer: Self-pay

## 2022-09-17 NOTE — Telephone Encounter (Signed)
TCT pt to follow up from recent visit. Pt denies any additional needs.  

## 2022-09-25 ENCOUNTER — Telehealth: Payer: Self-pay

## 2022-09-25 NOTE — Telephone Encounter (Signed)
No one will be here to review results. Urgent care recommended.

## 2022-09-25 NOTE — Telephone Encounter (Signed)
BS pt calling to reports sxs of bladder infection and wondering if she can come by to leave sample? Please advise if ok, can place on lab or nurse schedule.

## 2022-09-25 NOTE — Telephone Encounter (Signed)
Pt notified and voiced understanding 

## 2022-09-28 ENCOUNTER — Ambulatory Visit: Payer: 59 | Admitting: Obstetrics and Gynecology

## 2022-09-28 ENCOUNTER — Encounter: Payer: Self-pay | Admitting: Obstetrics and Gynecology

## 2022-09-28 VITALS — BP 118/76 | HR 80 | Ht 65.5 in | Wt 160.0 lb

## 2022-09-28 DIAGNOSIS — R300 Dysuria: Secondary | ICD-10-CM

## 2022-09-28 DIAGNOSIS — R350 Frequency of micturition: Secondary | ICD-10-CM | POA: Diagnosis not present

## 2022-09-28 DIAGNOSIS — M549 Dorsalgia, unspecified: Secondary | ICD-10-CM

## 2022-09-28 MED ORDER — AMOXICILLIN-POT CLAVULANATE 500-125 MG PO TABS: 500.0000 mg | ORAL_TABLET | Freq: Two times a day (BID) | ORAL | 0 refills | Status: DC

## 2022-09-28 NOTE — Progress Notes (Signed)
GYNECOLOGY  VISIT   HPI: 57 y.o.   Married  Caucasian  female   G2P2 with Patient's last menstrual period was 11/12/2014.   here for  UTI. Having dysuria, cloudy urine, low back and lower abdominal pain.  No fevers, shakes, chills.   No vaginal itching or discharge.   Had E Coli UTI 06/26/22, treated with Augmentin.  Had yellow dye allergy.  Treated recently for bronchitis.   GYNECOLOGIC HISTORY: Patient's last menstrual period was 11/12/2014. Contraception:  hysterectomy Menopausal hormone therapy:  n/a Last mammogram:  2009-- Bilateral mastectomy   Last pap smear:   2015, normal        OB History     Gravida  2   Para  2   Term      Preterm      AB      Living  3      SAB      IAB      Ectopic      Multiple  1   Live Births  3        Obstetric Comments  Pt was a surrogate for the twins            Patient Active Problem List   Diagnosis Date Noted   Primary osteoarthritis of both knees 09/01/2017   Scoliosis 09/01/2017   High risk medication use 11/11/2016   Primary osteoarthritis of both hands 11/11/2016   Lateral epicondylitis, right elbow 11/11/2016   Myopia with astigmatism and presbyopia, bilateral 09/09/2016   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   Blood transfusion without reported diagnosis 1976   1976  following surgery at [facility]) 08/2008   left breast--has bilateral mastectomy   Complication of anesthesia    pt states" gets shakes" with general   Cyst near tailbone    Elevated LDL cholesterol  level    Environmental and seasonal allergies    Fibroid    Headache    sleeps , no meds   Heart murmur    never has caused any problems   Helicobacter pylori infection    Hemorrhoids    hx of   History of breast cancer    History of shingles    03/07/18   Hypotension    hx of   Post-operative nausea and vomiting    RA (rheumatoid arthritis) (HCC)    RA (rheumatoid arthritis) (HCC)    Scoliosis    tx with robaxin prn - rarely uses   SVD (spontaneous vaginal delivery) 1996   x 1    Past Surgical History:  Procedure Laterality Date   ABDOMINAL HYSTERECTOMY     APPENDECTOMY     BREAST LUMPECTOMY  05/17/2008   x 2 re excise for margin - Dr Margot Chimes, bilateral mastectomy 08/2008   BREAST LUMPECTOMY W/ NEEDLE LOCALIZATION  04/23/2008   w [facility] Dr Margot Chimes   CESAREAN SECTION  01/01/2004   Dr Quincy Simmonds   COLONOSCOPY  03/2018   COLONOSCOPY  05/05/2019   ENDOMETRIAL BIOPSY  06/2013, 2015   x 2   EYE SURGERY  2003   bilateral lasik   FOOT SURGERY     HYSTEROSCOPY WITH D & C  07/21/2011   Procedure: DILATATION AND CURETTAGE (D&C) /HYSTEROSCOPY;  Surgeon: Arloa Koh;  Location: [facility] [facility];  Service: Gynecology;  Laterality: N/A;  with Ultrasound Guidance   INSERTION OF TISSUE EXPANDER AFTER MASTECTOMY  09/11/2008   Dr Harlow Mares   KNEE ARTHROSCOPY W/ MENISCAL REPAIR Right 2010   --Dr. Telford Nab   LAPAROSCOPIC ASSISTED VAGINAL HYSTERECTOMY N/A 01/15/2015   Procedure: LAPAROSCOPIC ASSISTED VAGINAL HYSTERECTOMY;  Surgeon: Nunzio Cobbs, MD;  Location: [place] [facility];  Service: Gynecology;  Laterality: N/A;   left foot surgery  2004   left rotator cuff repair  01/2006   MASTECTOMY  09/11/2008   bilateral - Dr Margot Chimes   [facility] REMOVAL  09/09/2009   Dr Margot Chimes   [facility] PLACEMENT  05/17/2008   Dr Cori Razor rounds of chemo   REMOVAL OF TISSUE EXPANDER AND PLACEMENT OF IMPLANT  12/2008   RHINOPLASTY     SALPINGOOPHORECTOMY Bilateral 01/15/2015   Procedure: BILATERAL SALPINGO OOPHORECTOMY;   Surgeon: Nunzio Cobbs, MD;  Location: [facility];  Service: Gynecology;  Laterality: Bilateral;   UMBILICAL HERNIA REPAIR  09/09/2009   Dr Margot Chimes   UPPER GI ENDOSCOPY  03/2018   WISDOM TOOTH EXTRACTION      Current Outpatient Medications  Medication Sig Dispense Refill   Adalimumab (HUMIRA Turner) Inject 40 mg into the skin.     calcium carbonate (OS-CAL) 600 MG TABS tablet Take 600 mg by mouth 2 (two) times daily with a meal.     cetirizine (ZYRTEC) 10 MG tablet Take 10 mg by mouth daily.     fluticasone (FLONASE) 50 MCG/ACT nasal spray Place into both nostrils daily.     FOLIC ACID PO Take by mouth.     GLUCOSAMINE PO Take 2 tablets by mouth daily.     hydroxychloroquine (PLAQUENIL) 200 MG tablet TAKE 1 TABLET BY MOUTH TWICE DAILY ON MONDAY-FRIDAY 120 tablet 0   ibuprofen (ADVIL) 200 MG tablet Take 200 mg by mouth as needed.     Magnesium 500 MG TABS Take 1 tablet by mouth 2 (two) times daily.     Manganese 50 MG TABS Take 1 tablet by mouth daily.     pantoprazole (PROTONIX) 40 MG tablet Take 1 tablet (40 mg total) by mouth daily. 90 tablet 3   Polyvinyl Alcohol-Povidone (REFRESH OP) Apply to eye.     promethazine (PHENERGAN) 25 MG tablet Take 25-50 mg by mouth every 8 (eight) hours as needed.     TURMERIC PO Take by mouth daily.     predniSONE (DELTASONE) 20 MG tablet Take 2 tablets (40 mg total) by mouth daily with breakfast. (Patient not taking: Reported on 09/28/2022) 10 tablet 0   No current facility-administered medications for this visit.     ALLERGIES: Latex, Yellow dye, and Percocet [oxycodone-acetaminophen]  Family History  Problem Relation Age of Onset   Breast cancer Mother    Diabetes Mother    Hypertension Mother    Stroke Mother    Irritable bowel syndrome Mother    Kidney disease Mother    Heart failure Father    Hypertension Father    Colon cancer Neg Hx    Colon polyps Neg Hx    Esophageal cancer Neg Hx    Pancreatic cancer Neg Hx    Rectal cancer Neg  Hx  Stomach cancer Neg Hx     Social History   Socioeconomic History   Marital status: Married    Spouse name: Not on file   Number of children: 1   Years of education: Not on file   Highest education level: Not on file  Occupational History   Not on file  Tobacco Use   Smoking status: Never    Passive exposure: Past   Smokeless tobacco: Never  Vaping Use   Vaping Use: Never used  Substance and Sexual Activity   Alcohol use: Yes    Alcohol/week: 2.0 standard drinks of alcohol    Types: 2 Cans of beer per week    Comment: occ glass of wine or beer   Drug use: No   Sexual activity: Yes    Partners: Male    Birth control/protection: Post-menopausal    Comment: Hyst/BSO  Other Topics Concern   Not on file  Social History Narrative   Right Handed    Lives in a two story home. Main living on first floor.   Social Determinants of Health   Financial Resource Strain: Not on file  Food Insecurity: Not on file  Transportation Needs: Not on file  Physical Activity: Not on file  Stress: Not on file  Social Connections: Not on file  Intimate Partner Violence: Not on file    Review of Systems  Genitourinary:  Positive for dysuria, flank pain, frequency and urgency.    PHYSICAL EXAMINATION:    BP 118/76 (BP Location: Right Arm, Patient Position: Sitting, Cuff Size: Normal)   Pulse 80   Ht 5' 5.5" (1.664 m)   Wt 160 lb (72.6 kg)   LMP 11/12/2014   SpO2 98%   BMI 26.22 kg/m     General appearance: alert, cooperative and appears stated age   Urinalysis:  Sg 1.026, pH 6.5 Positive nitrites. 10 - 20 WBC, 0 - 2 RBC, 0 - 5 squams, many bacteria, moderate mucous.   ASSESSMENT  Cystitis. On Humira. Hx left breast cancer. ER, PR positive.  Status post bilateral mastectomy.   PLAN  Urinalysis and UC sent.  Augmentin 500/125 mg po bid x 7 days.  She will take AZO that she has at home.  Hydrate well.  Call if no improvement in 48 hours.  Hydrate the vaginal area  with water based lubricants.  FU prn.  An After Visit Summary was printed and given to the patient.

## 2022-09-28 NOTE — Patient Instructions (Signed)
Urinary Tract Infection, Adult  A urinary tract infection (UTI) is an infection of any part of the urinary tract. The urinary tract includes the kidneys, ureters, bladder, and urethra. These organs make, store, and get rid of urine in the body. An upper UTI affects the ureters and kidneys. A lower UTI affects the bladder and urethra. What are the causes? Most urinary tract infections are caused by bacteria in your genital area around your urethra, where urine leaves your body. These bacteria grow and cause inflammation of your urinary tract. What increases the risk? You are more likely to develop this condition if: You have a urinary catheter that stays in place. You are not able to control when you urinate or have a bowel movement (incontinence). You are female and you: Use a spermicide or diaphragm for birth control. Have low estrogen levels. Are pregnant. You have certain genes that increase your risk. You are sexually active. You take antibiotic medicines. You have a condition that causes your flow of urine to slow down, such as: An enlarged prostate, if you are female. Blockage in your urethra. A kidney stone. A nerve condition that affects your bladder control (neurogenic bladder). Not getting enough to drink, or not urinating often. You have certain medical conditions, such as: Diabetes. A weak disease-fighting system (immunesystem). Sickle cell disease. Gout. Spinal cord injury. What are the signs or symptoms? Symptoms of this condition include: Needing to urinate right away (urgency). Frequent urination. This may include small amounts of urine each time you urinate. Pain or burning with urination. Blood in the urine. Urine that smells bad or unusual. Trouble urinating. Cloudy urine. Vaginal discharge, if you are female. Pain in the abdomen or the lower back. You may also have: Vomiting or a decreased appetite. Confusion. Irritability or tiredness. A fever or  chills. Diarrhea. The first symptom in older adults may be confusion. In some cases, they may not have any symptoms until the infection has worsened. How is this diagnosed? This condition is diagnosed based on your medical history and a physical exam. You may also have other tests, including: Urine tests. Blood tests. Tests for STIs (sexually transmitted infections). If you have had more than one UTI, a cystoscopy or imaging studies may be done to determine the cause of the infections. How is this treated? Treatment for this condition includes: Antibiotic medicine. Over-the-counter medicines to treat discomfort. Drinking enough water to stay hydrated. If you have frequent infections or have other conditions such as a kidney stone, you may need to see a health care provider who specializes in the urinary tract (urologist). In rare cases, urinary tract infections can cause sepsis. Sepsis is a life-threatening condition that occurs when the body responds to an infection. Sepsis is treated in the hospital with IV antibiotics, fluids, and other medicines. Follow these instructions at home:  Medicines Take over-the-counter and prescription medicines only as told by your health care provider. If you were prescribed an antibiotic medicine, take it as told by your health care provider. Do not stop using the antibiotic even if you start to feel better. General instructions Make sure you: Empty your bladder often and completely. Do not hold urine for long periods of time. Empty your bladder after sex. Wipe from front to back after urinating or having a bowel movement if you are female. Use each tissue only one time when you wipe. Drink enough fluid to keep your urine pale yellow. Keep all follow-up visits. This is important. Contact a health   care provider if: Your symptoms do not get better after 1-2 days. Your symptoms go away and then return. Get help right away if: You have severe pain in  your back or your lower abdomen. You have a fever or chills. You have nausea or vomiting. Summary A urinary tract infection (UTI) is an infection of any part of the urinary tract, which includes the kidneys, ureters, bladder, and urethra. Most urinary tract infections are caused by bacteria in your genital area. Treatment for this condition often includes antibiotic medicines. If you were prescribed an antibiotic medicine, take it as told by your health care provider. Do not stop using the antibiotic even if you start to feel better. Keep all follow-up visits. This is important. This information is not intended to replace advice given to you by your health care provider. Make sure you discuss any questions you have with your health care provider. Document Revised: 04/19/2020 Document Reviewed: 04/19/2020 Elsevier Patient Education  2023 Elsevier Inc.  

## 2022-10-01 LAB — URINALYSIS, COMPLETE W/RFL CULTURE
Bilirubin Urine: NEGATIVE
Glucose, UA: NEGATIVE
Hgb urine dipstick: NEGATIVE
Hyaline Cast: NONE SEEN /LPF
Nitrites, Initial: POSITIVE — AB
Specific Gravity, Urine: 1.026 (ref 1.001–1.035)
pH: 6.5 (ref 5.0–8.0)

## 2022-10-01 LAB — URINE CULTURE
MICRO NUMBER:: 14401396
SPECIMEN QUALITY:: ADEQUATE

## 2022-10-01 LAB — CULTURE INDICATED

## 2022-11-01 ENCOUNTER — Other Ambulatory Visit: Payer: Self-pay | Admitting: Physician Assistant

## 2022-11-01 DIAGNOSIS — M0579 Rheumatoid arthritis with rheumatoid factor of multiple sites without organ or systems involvement: Secondary | ICD-10-CM

## 2022-11-02 ENCOUNTER — Other Ambulatory Visit: Payer: Self-pay | Admitting: *Deleted

## 2022-11-02 DIAGNOSIS — M0579 Rheumatoid arthritis with rheumatoid factor of multiple sites without organ or systems involvement: Secondary | ICD-10-CM

## 2022-11-02 MED ORDER — HYDROXYCHLOROQUINE SULFATE 200 MG PO TABS: ORAL_TABLET | ORAL | 0 refills | Status: DC

## 2022-11-02 NOTE — Telephone Encounter (Signed)
Refill request received via fax from [facility] for PLQ   Next Visit: 02/10/2023  Last Visit: 09/08/2022  Last Fill: 09/02/2022  DX:  Rheumatoid arthritis involving multiple sites with positive rheumatoid factor    Current Dose per office note 09/08/2022: Humira 40 mg subcutaneous injections every 14 days   Labs: 08/25/2022 CO2 31,   PLQ Eye Exam: 10/08/2021 WNL Eye exam scheduled for December 16, 2022  Okay to refill PLQ?

## 2022-11-09 ENCOUNTER — Ambulatory Visit: Payer: 59 | Admitting: Obstetrics and Gynecology

## 2022-11-09 ENCOUNTER — Encounter: Payer: Self-pay | Admitting: Obstetrics and Gynecology

## 2022-11-09 VITALS — BP 110/70 | HR 56 | Ht 65.5 in | Wt 159.0 lb

## 2022-11-09 DIAGNOSIS — R350 Frequency of micturition: Secondary | ICD-10-CM | POA: Diagnosis not present

## 2022-11-09 DIAGNOSIS — R300 Dysuria: Secondary | ICD-10-CM | POA: Diagnosis not present

## 2022-11-09 DIAGNOSIS — R109 Unspecified abdominal pain: Secondary | ICD-10-CM

## 2022-11-09 MED ORDER — AMOXICILLIN-POT CLAVULANATE 500-125 MG PO TABS: 500.0000 mg | ORAL_TABLET | Freq: Two times a day (BID) | ORAL | 0 refills | Status: DC

## 2022-11-09 NOTE — Patient Instructions (Addendum)
Consider over the counter D Mannose for urinary tract prevention.   Urinary Tract Infection, Adult  A urinary tract infection (UTI) is an infection of any part of the urinary tract. The urinary tract includes the kidneys, ureters, bladder, and urethra. These organs make, store, and get rid of urine in the body. An upper UTI affects the ureters and kidneys. A lower UTI affects the bladder and urethra. What are the causes? Most urinary tract infections are caused by bacteria in your genital area around your urethra, where urine leaves your body. These bacteria grow and cause inflammation of your urinary tract. What increases the risk? You are more likely to develop this condition if: You have a urinary catheter that stays in place. You are not able to control when you urinate or have a bowel movement (incontinence). You are female and you: Use a spermicide or diaphragm for birth control. Have low estrogen levels. Are pregnant. You have certain genes that increase your risk. You are sexually active. You take antibiotic medicines. You have a condition that causes your flow of urine to slow down, such as: An enlarged prostate, if you are female. Blockage in your urethra. A kidney stone. A nerve condition that affects your bladder control (neurogenic bladder). Not getting enough to drink, or not urinating often. You have certain medical conditions, such as: Diabetes. A weak disease-fighting system (immunesystem). Sickle cell disease. Gout. Spinal cord injury. What are the signs or symptoms? Symptoms of this condition include: Needing to urinate right away (urgency). Frequent urination. This may include small amounts of urine each time you urinate. Pain or burning with urination. Blood in the urine. Urine that smells bad or unusual. Trouble urinating. Cloudy urine. Vaginal discharge, if you are female. Pain in the abdomen or the lower back. You may also have: Vomiting or a decreased  appetite. Confusion. Irritability or tiredness. A fever or chills. Diarrhea. The first symptom in older adults may be confusion. In some cases, they may not have any symptoms until the infection has worsened. How is this diagnosed? This condition is diagnosed based on your medical history and a physical exam. You may also have other tests, including: Urine tests. Blood tests. Tests for STIs (sexually transmitted infections). If you have had more than one UTI, a cystoscopy or imaging studies may be done to determine the cause of the infections. How is this treated? Treatment for this condition includes: Antibiotic medicine. Over-the-counter medicines to treat discomfort. Drinking enough water to stay hydrated. If you have frequent infections or have other conditions such as a kidney stone, you may need to see a health care provider who specializes in the urinary tract (urologist). In rare cases, urinary tract infections can cause sepsis. Sepsis is a life-threatening condition that occurs when the body responds to an infection. Sepsis is treated in the hospital with IV antibiotics, fluids, and other medicines. Follow these instructions at home:  Medicines Take over-the-counter and prescription medicines only as told by your health care provider. If you were prescribed an antibiotic medicine, take it as told by your health care provider. Do not stop using the antibiotic even if you start to feel better. General instructions Make sure you: Empty your bladder often and completely. Do not hold urine for long periods of time. Empty your bladder after sex. Wipe from front to back after urinating or having a bowel movement if you are female. Use each tissue only one time when you wipe. Drink enough fluid to keep your urine  pale yellow. Keep all follow-up visits. This is important. Contact a health care provider if: Your symptoms do not get better after 1-2 days. Your symptoms go away and then  return. Get help right away if: You have severe pain in your back or your lower abdomen. You have a fever or chills. You have nausea or vomiting. Summary A urinary tract infection (UTI) is an infection of any part of the urinary tract, which includes the kidneys, ureters, bladder, and urethra. Most urinary tract infections are caused by bacteria in your genital area. Treatment for this condition often includes antibiotic medicines. If you were prescribed an antibiotic medicine, take it as told by your health care provider. Do not stop using the antibiotic even if you start to feel better. Keep all follow-up visits. This is important. This information is not intended to replace advice given to you by your health care provider. Make sure you discuss any questions you have with your health care provider. Document Revised: 04/19/2020 Document Reviewed: 04/19/2020 Elsevier Patient Education  [place].

## 2022-11-09 NOTE — Progress Notes (Unsigned)
GYNECOLOGY  VISIT   HPI: 57 y.o.   Married  Caucasian  female   G2P2 with Patient's last menstrual period was 11/12/2014.   here for   UTI.    Symptoms onset after intercourse.  Humira given last week.   Other UTIs not prompted by this.  Pain with urination, frequency, and voiding small amounts since yesterday. No fever.  Some back pain.  No nausea or vomiting.   Did not use pyridium.  GYNECOLOGIC HISTORY: Patient's last menstrual period was 11/12/2014. Contraception:  hysterectomy Menopausal hormone therapy:  n/a Last mammogram:  2009-- Bilateral mastectomy   Last pap smear:   2015 normal        OB History     Gravida  2   Para  2   Term      Preterm      AB      Living  3      SAB      IAB      Ectopic      Multiple  1   Live Births  3        Obstetric Comments  Pt was a surrogate for the twins            Patient Active Problem List   Diagnosis Date Noted   Primary osteoarthritis of both knees 09/01/2017   Scoliosis 09/01/2017   High risk medication use 11/11/2016   Primary osteoarthritis of both hands 11/11/2016   Lateral epicondylitis, right elbow 11/11/2016   Myopia with astigmatism and presbyopia, bilateral 09/09/2016   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   Blood transfusion without reported diagnosis 1976   1976  following surgery at [facility]) 08/2008   left breast--has bilateral mastectomy   Complication of anesthesia    pt states" gets shakes" with general   Cyst near tailbone    Elevated  LDL cholesterol level    Environmental and seasonal allergies    Fibroid    Headache    sleeps , no meds   Heart murmur    never has caused any problems   Helicobacter pylori infection    Hemorrhoids    hx of   History of breast cancer    History of shingles    03/07/18   Hypotension    hx of   Post-operative nausea and vomiting    RA (rheumatoid arthritis) (HCC)    RA (rheumatoid arthritis) (HCC)    Scoliosis    tx with robaxin prn - rarely uses   SVD (spontaneous vaginal delivery) 1996   x 1    Past Surgical History:  Procedure Laterality Date   ABDOMINAL HYSTERECTOMY     APPENDECTOMY     BREAST LUMPECTOMY  05/17/2008   x 2 re excise for margin - Dr Margot Chimes, bilateral mastectomy 08/2008   BREAST LUMPECTOMY W/ NEEDLE LOCALIZATION  04/23/2008   w [facility] Dr Margot Chimes   CESAREAN SECTION  01/01/2004   Dr Quincy Simmonds   COLONOSCOPY  03/2018   COLONOSCOPY  05/05/2019   ENDOMETRIAL BIOPSY  06/2013, 2015   x 2   EYE SURGERY  2003   bilateral lasik   FOOT SURGERY     HYSTEROSCOPY WITH D & C  07/21/2011   Procedure: DILATATION AND CURETTAGE (D&C) /HYSTEROSCOPY;  Surgeon: Arloa Koh;  Location: [facility];  Service: Gynecology;  Laterality: N/A;  with Ultrasound Guidance   INSERTION OF TISSUE EXPANDER AFTER MASTECTOMY  09/11/2008   Dr Harlow Mares   KNEE ARTHROSCOPY W/ MENISCAL REPAIR Right 2010   --Dr. Telford Nab   LAPAROSCOPIC ASSISTED VAGINAL HYSTERECTOMY N/A 01/15/2015   Procedure: LAPAROSCOPIC ASSISTED VAGINAL HYSTERECTOMY;  Surgeon: Nunzio Cobbs, MD;  Location: [facility];  Service: Gynecology;  Laterality: N/A;   left foot surgery  2004   left rotator cuff repair  01/2006   MASTECTOMY  09/11/2008   bilateral - Dr Margot Chimes   [facility] REMOVAL  09/09/2009   Dr Margot Chimes   [facility] PLACEMENT  05/17/2008   Dr Cori Razor rounds of chemo   REMOVAL OF TISSUE EXPANDER AND PLACEMENT OF IMPLANT  12/2008   RHINOPLASTY     SALPINGOOPHORECTOMY Bilateral 01/15/2015   Procedure: BILATERAL SALPINGO  OOPHORECTOMY;  Surgeon: Nunzio Cobbs, MD;  Location: [facility];  Service: Gynecology;  Laterality: Bilateral;   UMBILICAL HERNIA REPAIR  09/09/2009   Dr Margot Chimes   UPPER GI ENDOSCOPY  03/2018   WISDOM TOOTH EXTRACTION      Current Outpatient Medications  Medication Sig Dispense Refill   Adalimumab (HUMIRA [place]) Inject 40 mg into the skin.     amoxicillin-clavulanate (AUGMENTIN) 500-125 MG tablet Take 1 tablet by mouth 2 (two) times daily. Take twice daily for one week. 14 tablet 0   calcium carbonate (OS-CAL) 600 MG TABS tablet Take 600 mg by mouth 2 (two) times daily with a meal.     cetirizine (ZYRTEC) 10 MG tablet Take 10 mg by mouth daily.     fluticasone (FLONASE) 50 MCG/ACT nasal spray Place into both nostrils daily.     FOLIC ACID PO Take by mouth.     GLUCOSAMINE PO Take 2 tablets by mouth daily.     hydroxychloroquine (PLAQUENIL) 200 MG tablet Take 1 tablet by mouth twice daily Monday-Friday only 120 tablet 0   ibuprofen (ADVIL) 200 MG tablet Take 200 mg by mouth as needed.     Magnesium 500 MG TABS Take 1 tablet by mouth 2 (two) times daily.     Manganese 50 MG TABS Take 1 tablet by mouth daily.     pantoprazole (PROTONIX) 40 MG tablet Take 1 tablet (40 mg total) by mouth daily. 90 tablet 3   Polyvinyl Alcohol-Povidone (REFRESH OP) Apply to eye.     predniSONE (DELTASONE) 20 MG tablet Take 2 tablets (40 mg total) by mouth daily with breakfast. 10 tablet 0   promethazine (PHENERGAN) 25 MG tablet Take 25-50 mg by mouth every 8 (eight) hours as needed.     TURMERIC PO Take by mouth daily.     No current facility-administered medications for this visit.     ALLERGIES: Latex, Yellow dye, and Percocet [oxycodone-acetaminophen]  Family History  Problem Relation Age of Onset   Breast cancer Mother    Diabetes Mother    Hypertension Mother    Stroke Mother    Irritable bowel syndrome Mother    Kidney disease Mother    Heart failure Father    Hypertension Father    Colon  cancer Neg Hx  Colon polyps Neg Hx    Esophageal cancer Neg Hx    Pancreatic cancer Neg Hx    Rectal cancer Neg Hx    Stomach cancer Neg Hx     Social History   Socioeconomic History   Marital status: Married    Spouse name: Not on file   Number of children: 1   Years of education: Not on file   Highest education level: Not on file  Occupational History   Not on file  Tobacco Use   Smoking status: Never    Passive exposure: Past   Smokeless tobacco: Never  Vaping Use   Vaping Use: Never used  Substance and Sexual Activity   Alcohol use: Yes    Alcohol/week: 2.0 standard drinks of alcohol    Types: 2 Cans of beer per week    Comment: occ glass of wine or beer   Drug use: No   Sexual activity: Yes    Partners: Male    Birth control/protection: Post-menopausal    Comment: Hyst/BSO  Other Topics Concern   Not on file  Social History Narrative   Right Handed    Lives in a two story home. Main living on first floor.   Social Determinants of Health   Financial Resource Strain: Not on file  Food Insecurity: Not on file  Transportation Needs: Not on file  Physical Activity: Not on file  Stress: Not on file  Social Connections: Not on file  Intimate Partner Violence: Not on file    Review of Systems  Genitourinary:  Positive for dysuria, flank pain and frequency.    PHYSICAL EXAMINATION:    BP 110/70 (BP Location: Right Arm, Patient Position: Sitting, Cuff Size: Normal)   Pulse (!) 56   Ht 5' 5.5" (1.664 m)   Wt 159 lb (72.1 kg)   LMP 11/12/2014   SpO2 97%   BMI 26.06 kg/m     General appearance: alert, cooperative and appears stated age   Pelvic: External genitalia:  no lesions              Urethra:  normal appearing urethra with no masses, tenderness or lesions              Bartholins and Skenes: normal                 Vagina: normal appearing vagina with normal color and discharge, no lesions              Cervix: absent                Bimanual Exam:   Uterus:  absent.              Adnexa: no mass, fullness, tenderness       Chaperone was present for exam:  Raquel Sarna  ASSESSMENT  Cystitis.  Recurrent.  Hx breast cancer.  Allergy to yellow dye.  PLAN  Urinalysis;  sg 1.025, ph 5.5, 1+ protein, 6 - 10 WBC, 3 -10 RBC, 6 - 10 epis, few bacteria.  UC sent.  Augmentin 500/125 mg po bid x 7 days.  Ok to use pyridium 200 mg po tid x 2 days as needed. D Mannose for UTI prevention.  Will consider trimethoprim or other appropriate post coital UTI prevention after the cx and sensitivities are back. Hydrate well.  Call if no improvement in 48 hours.    An After Visit Summary was printed and given to the patient.

## 2022-11-12 LAB — URINALYSIS, COMPLETE W/RFL CULTURE
Bilirubin Urine: NEGATIVE
Glucose, UA: NEGATIVE
Hyaline Cast: NONE SEEN /LPF
Ketones, ur: NEGATIVE
Leukocyte Esterase: NEGATIVE
Nitrites, Initial: NEGATIVE
Specific Gravity, Urine: 1.025 (ref 1.001–1.035)
pH: 5.5 (ref 5.0–8.0)

## 2022-11-12 LAB — URINE CULTURE
MICRO NUMBER:: 14583054
SPECIMEN QUALITY:: ADEQUATE

## 2022-11-12 LAB — CULTURE INDICATED

## 2022-12-09 ENCOUNTER — Encounter: Payer: Self-pay | Admitting: *Deleted

## 2022-12-09 ENCOUNTER — Other Ambulatory Visit: Payer: Self-pay | Admitting: Rheumatology

## 2022-12-09 NOTE — Telephone Encounter (Signed)
Last Fill: 09/08/2022  Labs: 08/25/2022 CO2 31  TB Gold: 11/10/2021 Neg    Next Visit: 02/10/2023  Last Visit: 09/08/2022  DX: Rheumatoid arthritis involving multiple sites with positive rheumatoid factor   Current Dose per office note 09/08/2022: Humira 40 mg subcutaneous injections every 14 days   Sent message to patient via my chart to advise patient she is due to update labs.   Okay to refill Humira?

## 2022-12-10 ENCOUNTER — Other Ambulatory Visit: Payer: Self-pay | Admitting: *Deleted

## 2022-12-10 DIAGNOSIS — M0579 Rheumatoid arthritis with rheumatoid factor of multiple sites without organ or systems involvement: Secondary | ICD-10-CM

## 2022-12-10 DIAGNOSIS — Z79899 Other long term (current) drug therapy: Secondary | ICD-10-CM

## 2022-12-12 LAB — COMPLETE METABOLIC PANEL WITH GFR
AG Ratio: 1.8 (calc) (ref 1.0–2.5)
ALT: 15 U/L (ref 6–29)
AST: 17 U/L (ref 10–35)
Albumin: 4 g/dL (ref 3.6–5.1)
Alkaline phosphatase (APISO): 53 U/L (ref 37–153)
BUN: 16 mg/dL (ref 7–25)
CO2: 30 mmol/L (ref 20–32)
Calcium: 9.8 mg/dL (ref 8.6–10.4)
Chloride: 104 mmol/L (ref 98–110)
Creat: 0.87 mg/dL (ref 0.50–1.03)
Globulin: 2.2 g/dL (calc) (ref 1.9–3.7)
Glucose, Bld: 69 mg/dL (ref 65–99)
Potassium: 4.5 mmol/L (ref 3.5–5.3)
Sodium: 141 mmol/L (ref 135–146)
Total Bilirubin: 0.6 mg/dL (ref 0.2–1.2)
Total Protein: 6.2 g/dL (ref 6.1–8.1)
eGFR: 77 mL/min/{1.73_m2} (ref >=60)

## 2022-12-12 LAB — CBC WITH DIFFERENTIAL/PLATELET
Absolute Monocytes: 493 cells/uL (ref 200–950)
Basophils Absolute: 62 cells/uL (ref 0–200)
Basophils Relative: 1.1 %
Eosinophils Absolute: 610 cells/uL — ABNORMAL HIGH (ref 15–500)
Eosinophils Relative: 10.9 %
HCT: 39.8 % (ref 35.0–45.0)
Hemoglobin: 13.4 g/dL (ref 11.7–15.5)
Lymphs Abs: 2279 cells/uL (ref 850–3900)
MCH: 31.1 pg (ref 27.0–33.0)
MCHC: 33.7 g/dL (ref 32.0–36.0)
MCV: 92.3 fL (ref 80.0–100.0)
MPV: 11 fL (ref 7.5–12.5)
Monocytes Relative: 8.8 %
Neutro Abs: 2156 cells/uL (ref 1500–7800)
Neutrophils Relative %: 38.5 %
Platelets: 212 10*3/uL (ref 140–400)
RBC: 4.31 10*6/uL (ref 3.80–5.10)
RDW: 12.5 % (ref 11.0–15.0)
Total Lymphocyte: 40.7 %
WBC: 5.6 10*3/uL (ref 3.8–10.8)

## 2022-12-12 LAB — QUANTIFERON-TB GOLD PLUS
Mitogen-NIL: >10 IU/mL
NIL: 0.09 IU/mL
QuantiFERON-TB Gold Plus: NEGATIVE
TB1-NIL: 0 IU/mL
TB2-NIL: 0 IU/mL

## 2022-12-30 ENCOUNTER — Other Ambulatory Visit: Payer: Self-pay | Admitting: Physician Assistant

## 2022-12-30 NOTE — Telephone Encounter (Signed)
Last Fill: 12/09/2022 (30 day supply)  Labs: 12/10/2022 Absolute eosinophils are borderline elevated but have improved. Rest of CBC Wnl. CMP WNL.  TB Gold: 12/10/2022 Neg    Next Visit: 02/10/2023  Last Visit: 09/08/2022  JH:ERDEYCXKGY arthritis involving multiple sites with positive rheumatoid factor   Current Dose per office note 09/08/2022: Humira 40 mg subcutaneous injections every 14 days   Okay to refill Humira?

## 2023-01-27 NOTE — Progress Notes (Unsigned)
Office Visit Note  Patient: Rebecca Fleming             Date of Birth: May 23, 1966           MRN: [id number]             PCP: Patton Salles, MD Referring: Janean Sark* Visit Date: 02/10/2023 Occupation: @GUAROCC @  Subjective:  Lower back pain   History of Present Illness: Rebecca Fleming is a 57 y.o. female with history of seropositive rheumatoid arthritis and osteoarthritis.  Patient remains on Humira 40 mg subcutaneous injections every 14 days and Plaquenil 200 mg 1 tablet by mouth twice daily, Monday through Friday only.  She is tolerating combination therapy without any side effects.  She denies any recent rheumatoid arthritis flares.  She is not experiencing any morning stiffness, difficulty with ADLs, or joint swelling.  Patient states that since the end of April she has been experiencing increased lower back pain and right-sided sciatica.  She has been trying stretching exercises and has continued aerobics classes with modifications.  She has been experiencing nocturnal pain since she has difficulty lying flat at night.  She last night she tried using pillows under her knees which was helpful.  She has tried taking a leftover expired gabapentin prescription which has provided minimal relief.  She has not followed up with an orthopedist for further evaluation yet.    Activities of Daily Living:  Patient reports morning stiffness for 0 minute.   Patient Reports nocturnal pain.  Difficulty dressing/grooming: Denies Difficulty climbing stairs: Denies Difficulty getting out of chair: Denies Difficulty using hands for taps, buttons, cutlery, and/or writing: Reports  Review of Systems  Constitutional:  Positive for fatigue.  HENT:  Negative for mouth sores and mouth dryness.   Eyes:  Positive for dryness.  Respiratory:  Negative for shortness of breath.   Cardiovascular:  Negative for chest pain and palpitations.  Gastrointestinal:  Negative for blood in stool,  constipation and diarrhea.  Endocrine: Negative for increased urination.  Genitourinary:  Negative for involuntary urination.  Musculoskeletal:  Positive for joint pain, gait problem, joint pain, muscle weakness and muscle tenderness. Negative for joint swelling, myalgias, morning stiffness and myalgias.  Skin:  Negative for color change, rash, hair loss and sensitivity to sunlight.  Allergic/Immunologic: Negative for susceptible to infections.  Neurological:  Negative for dizziness and headaches.  Hematological:  Negative for swollen glands.  Psychiatric/Behavioral:  Positive for sleep disturbance. Negative for depressed mood. The patient is not nervous/anxious.     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   Blood transfusion without reported diagnosis 1976   1976  following surgery at [facility]    Cancer [facility])  08/2008   left breast--has bilateral mastectomy   Complication of anesthesia    pt states" gets shakes" with general   Cyst near tailbone    Elevated LDL cholesterol level    Environmental and seasonal allergies    Fibroid    Headache    sleeps , no meds   Heart murmur    never has caused any problems   Helicobacter pylori  infection    Hemorrhoids    hx of   History of breast cancer    History of shingles    03/07/18   Hypotension    hx of   Post-operative nausea and vomiting    RA (rheumatoid arthritis) (HCC)    RA (rheumatoid arthritis) (HCC)    Scoliosis    tx with robaxin prn - rarely uses   SVD (spontaneous vaginal delivery) 1996   x 1    Family History  Problem Relation Age of Onset   Breast cancer Mother    Diabetes Mother    Hypertension Mother    Stroke Mother    Irritable bowel syndrome Mother    Kidney disease Mother    Heart failure Father    Hypertension Father    Colon cancer Neg Hx    Colon polyps Neg Hx    Esophageal cancer Neg Hx    Pancreatic cancer Neg Hx    Rectal cancer Neg Hx    Stomach cancer Neg Hx    Past Surgical History:  Procedure Laterality Date   ABDOMINAL HYSTERECTOMY     APPENDECTOMY     BREAST LUMPECTOMY  05/17/2008   x 2 re excise for margin - Dr Jamey Ripa, bilateral mastectomy 08/2008   BREAST LUMPECTOMY W/ NEEDLE LOCALIZATION  04/23/2008   w [facility] Dr Jamey Ripa   CESAREAN SECTION  01/01/2004   Dr Edward Jolly   COLONOSCOPY  03/2018   COLONOSCOPY  05/05/2019   ENDOMETRIAL BIOPSY  06/2013, 2015   x 2   EYE SURGERY  2003   bilateral lasik   FOOT SURGERY     HYSTEROSCOPY WITH D & C  07/21/2011   Procedure: DILATATION AND CURETTAGE (D&C) /HYSTEROSCOPY;  Surgeon: Melony Overly;  Location: [facility];  Service: Gynecology;  Laterality: N/A;  with Ultrasound Guidance   INSERTION OF TISSUE EXPANDER AFTER MASTECTOMY  09/11/2008   Dr Odis Luster   KNEE ARTHROSCOPY W/ MENISCAL REPAIR Right 2010   --Dr. Priscille Kluver   LAPAROSCOPIC ASSISTED VAGINAL HYSTERECTOMY N/A 01/15/2015   Procedure: LAPAROSCOPIC ASSISTED VAGINAL HYSTERECTOMY;  Surgeon: Patton Salles, MD;  Location: [facility];  Service: Gynecology;  Laterality: N/A;   left foot surgery  2004   left rotator cuff repair  01/2006   MASTECTOMY  09/11/2008   bilateral - Dr Jamey Ripa   [facility] REMOVAL  09/09/2009   Dr Jamey Ripa    [facility] PLACEMENT  05/17/2008   Dr Salvadore Dom rounds of chemo   REMOVAL OF TISSUE EXPANDER AND PLACEMENT OF IMPLANT  12/2008   RHINOPLASTY     SALPINGOOPHORECTOMY Bilateral 01/15/2015   Procedure: BILATERAL SALPINGO OOPHORECTOMY;  Surgeon: Patton Salles, MD;  Location: [facility];  Service: Gynecology;  Laterality: Bilateral;   UMBILICAL HERNIA REPAIR  09/09/2009   Dr Jamey Ripa   UPPER GI ENDOSCOPY  03/2018   WISDOM TOOTH EXTRACTION     Social History   Social History Narrative   Right Handed    Lives in a two story home. Main living on first floor.   Immunization History  Administered Date(s) Administered   Influenza,inj,Quad PF,6+ Mos 07/12/2019   PFIZER Comirnaty(Gray Top)Covid-19 Tri-Sucrose Vaccine 10/05/2019, 10/25/2019, 05/11/2020   PFIZER(Purple Top)SARS-COV-2 Vaccination 10/05/2019, 10/25/2019, 05/11/2020   Pneumococcal Polysaccharide-23 01/16/2015   Tdap 09/21/2010, 07/12/2019     Objective: Vital Signs: BP 108/72 (BP Location: Right Arm, Patient Position: Sitting, Cuff Size: Normal)   Pulse (!) 59   Resp 14   Ht 5' 5.5" (1.664 m)   Wt 162 lb (73.5 kg)   LMP 11/12/2014   BMI 26.55 kg/m    Physical Exam Vitals and nursing note reviewed.  Constitutional:      Appearance: She is well-developed.  HENT:     Head: Normocephalic and atraumatic.  Eyes:     Conjunctiva/sclera: Conjunctivae normal.  Cardiovascular:     Rate and Rhythm: Normal rate and regular rhythm.     Heart sounds: Murmur heard.  Pulmonary:     Effort: Pulmonary effort is normal.     Breath sounds: Normal breath sounds.  Abdominal:     General: Bowel sounds are normal.     Palpations: Abdomen is soft.  Musculoskeletal:     Cervical back: Normal range of motion.  Skin:    General: Skin is warm and dry.     Capillary Refill: Capillary refill takes less than 2 seconds.  Neurological:     Mental Status: She is alert and oriented to person, place, and time.  Psychiatric:        Behavior:  Behavior normal.      Musculoskeletal Exam: C-spine, thoracic spine, lumbar spine have good range of motion.  Some tenderness over the right SI joint and right piriformis.  Shoulder joints, elbow joints, wrist joints, MCPs, PIPs, DIPs have good range of motion with no synovitis.  Complete fist formation bilaterally.  Hip joints have good range of motion with no groin pain.  No tenderness over the trochanteric bursa bilaterally.  Knee joints have good range of motion with no warmth or effusion.  Ankle joints have good range of motion with no tenderness or joint swelling.  CDAI Exam: CDAI Score: -- Patient Global: 1 mm; Provider Global: 1 mm Swollen: --; Tender: -- Joint Exam 02/10/2023   No joint exam has been documented for this visit   There is currently no information documented on the homunculus. Go to the Rheumatology activity and complete the homunculus joint exam.  Investigation: No additional findings.  Imaging: No results found.  Recent Labs: Lab Results  Component Value Date   WBC 5.6 12/10/2022   HGB 13.4 12/10/2022   PLT 212 12/10/2022   NA 141 12/10/2022   K 4.5 12/10/2022   CL 104 12/10/2022   CO2 30 12/10/2022   GLUCOSE 69 12/10/2022   BUN 16 12/10/2022   CREATININE 0.87 12/10/2022   BILITOT 0.6 12/10/2022   ALKPHOS 66 03/31/2017   AST 17 12/10/2022   ALT 15 12/10/2022   PROT 6.2 12/10/2022   ALBUMIN 4.1 03/31/2017   CALCIUM 9.8 12/10/2022   GFRAA 105 02/24/2021   QFTBGOLDPLUS NEGATIVE 12/10/2022    Speciality Comments: PLQ Eye Exam: 10/08/2021 WNL @ [facility] Opthalmology [facility] [phone] . Follow up in 1 year. Humira 09/22    Procedures:  No procedures performed Allergies: Latex, Yellow dye, and Percocet [oxycodone-acetaminophen]   Assessment / Plan:     Visit Diagnoses: Rheumatoid arthritis involving multiple sites with positive rheumatoid factor (HCC) - +RF, +CCP: She has no joint tenderness or synovitis on examination  today.  She has not had any signs or symptoms of a rheumatoid arthritis flare.  She has not been experiencing difficulty with ADLs or morning stiffness.  She has clinically been doing well on Humira 40 mg sq injections every 14 days and Plaquenil 200 mg 1 tablet by mouth twice daily Monday through Friday.  She is tolerating combination therapy without any side effects and has not missed any doses recently.  No medication changes will be made at this time.  She was advised to notify us if she develops signs or symptoms of a flare.  High risk medication use - Humira 40 mg subcutaneous injections every 14 days and Plaquenil 200 mg 1 tablet by mouth twice daily, Monday through Friday only. CBC and CMP updated on 12/10/22.  Her next lab work will be due in June and every 3 months.  Standing orders remain in place.  TB gold negative on 12/10/22.  Patient had an updated Plaquenil eye examination on 12/16/2022. No recurrent infections. Discussed the importance of holding humira if she develops signs or symptoms of an infection and to resume once the infection has completely cleared.   - Plan: CBC with Differential/Platelet, COMPLETE METABOLIC PANEL WITH GFR  Primary osteoarthritis of both hands: No tenderness or synovitis noted.  Complete fist formation bilaterally.  Dupuytren's contracture of right hand  Primary osteoarthritis of both knees: Good range of motion of both knee joints on examination today.  No warmth or effusion noted.  Chondromalacia of left patella  DDD (degenerative disc disease), cervical: C-spine has good range of motion with no discomfort.  Acute right-sided low back pain with right-sided sciatica: Patient presents today with increased discomfort in her lower back which started at the end of April 2024.  No injury prior to the onset of symptoms.  She has had bouts of sciatica in the past.  Her symptoms of sciatica typically improve with stretching and physical activity but her symptoms  have persisted.  She is experiencing right-sided sciatica at this time.  She has difficulty lying at night due to the discomfort.  She has been performing her aerobics classes and has been trying to stretch.  She is also tried some leftover expired gabapentin that she has taken in the past with minimal improvement.  A prednisone taper starting at 20 mg tapering by 5 mg every 2 days was sent to the pharmacy today.  If her symptoms persist or worsen plan to refer the patient to Dr. Christell Constant for further evaluation and management.  Psoriasis: No active psoriasis at this time.   Osteopenia of multiple sites - DEXA updated on 11/20/21: The BMD measured at Femur Neck Right is 0.798 g/cm2 with a T-score of -1.7.  Due to update DEXA in March 2025.   Other medical conditions are listed as follows:   History of breast cancer  Heart murmur  History of asthma  History of anemia  History of shingles   Orders: Orders Placed This Encounter  Procedures   CBC with Differential/Platelet   COMPLETE METABOLIC PANEL WITH GFR   Meds ordered this encounter  Medications   predniSONE (DELTASONE) 5 MG tablet    Sig: Take 4 tablets by mouth daily x2 days, 3 tablets daily x2 days, 2 tablets daily x2 days, 1 tablet daily x2 days.    Dispense:  20 tablet    Refill:  0    Follow-Up Instructions: Return in about 5 months (around 07/13/2023) for Rheumatoid arthritis, Osteoarthritis.   Gearldine Bienenstock, PA-C  Note - This record has been created using [employer].  Chart creation errors have been sought, but may not always  have been located. Such creation errors do not reflect on  the standard of medical care.

## 2023-02-10 ENCOUNTER — Encounter: Payer: Self-pay | Admitting: Physician Assistant

## 2023-02-10 ENCOUNTER — Ambulatory Visit: Payer: 59 | Attending: Physician Assistant | Admitting: Physician Assistant

## 2023-02-10 VITALS — BP 108/72 | HR 59 | Resp 14 | Ht 65.5 in | Wt 162.0 lb

## 2023-02-10 DIAGNOSIS — M5441 Lumbago with sciatica, right side: Secondary | ICD-10-CM

## 2023-02-10 DIAGNOSIS — Z8709 Personal history of other diseases of the respiratory system: Secondary | ICD-10-CM

## 2023-02-10 DIAGNOSIS — M0579 Rheumatoid arthritis with rheumatoid factor of multiple sites without organ or systems involvement: Secondary | ICD-10-CM | POA: Diagnosis not present

## 2023-02-10 DIAGNOSIS — M2242 Chondromalacia patellae, left knee: Secondary | ICD-10-CM

## 2023-02-10 DIAGNOSIS — R011 Cardiac murmur, unspecified: Secondary | ICD-10-CM

## 2023-02-10 DIAGNOSIS — M720 Palmar fascial fibromatosis [Dupuytren]: Secondary | ICD-10-CM

## 2023-02-10 DIAGNOSIS — M19041 Primary osteoarthritis, right hand: Secondary | ICD-10-CM

## 2023-02-10 DIAGNOSIS — L409 Psoriasis, unspecified: Secondary | ICD-10-CM

## 2023-02-10 DIAGNOSIS — M170 Bilateral primary osteoarthritis of knee: Secondary | ICD-10-CM

## 2023-02-10 DIAGNOSIS — M5030 Other cervical disc degeneration, unspecified cervical region: Secondary | ICD-10-CM

## 2023-02-10 DIAGNOSIS — Z79899 Other long term (current) drug therapy: Secondary | ICD-10-CM

## 2023-02-10 DIAGNOSIS — Z862 Personal history of diseases of the blood and blood-forming organs and certain disorders involving the immune mechanism: Secondary | ICD-10-CM

## 2023-02-10 DIAGNOSIS — Z853 Personal history of malignant neoplasm of breast: Secondary | ICD-10-CM

## 2023-02-10 DIAGNOSIS — Z8619 Personal history of other infectious and parasitic diseases: Secondary | ICD-10-CM

## 2023-02-10 DIAGNOSIS — M19042 Primary osteoarthritis, left hand: Secondary | ICD-10-CM

## 2023-02-10 DIAGNOSIS — M8589 Other specified disorders of bone density and structure, multiple sites: Secondary | ICD-10-CM

## 2023-02-10 MED ORDER — PREDNISONE 5 MG PO TABS: ORAL_TABLET | ORAL | 0 refills | Status: DC

## 2023-02-10 NOTE — Patient Instructions (Signed)
Standing Labs We placed an order today for your standing lab work.   Please have your standing labs drawn in June and every 3 months   Please have your labs drawn 2 weeks prior to your appointment so that the provider can discuss your lab results at your appointment, if possible.  Please note that you may see your imaging and lab results in MyChart before we have reviewed them. We will contact you once all results are reviewed. Please allow our office up to 72 hours to thoroughly review all of the results before contacting the office for clarification of your results.  WALK-IN LAB HOURS  Monday through Thursday from 8:00 am -12:30 pm and 1:00 pm-5:00 pm and Friday from 8:00 am-12:00 pm.  Patients with office visits requiring labs will be seen before walk-in labs.  You may encounter longer than normal wait times. Please allow additional time. Wait times may be shorter on  Monday and Thursday afternoons.  We do not book appointments for walk-in labs. We appreciate your patience and understanding with our staff.   Labs are drawn by [facility]. Please bring your co-pay at the time of your lab draw.  You may receive a bill from [facility] for your lab work.  Please note if you are on Hydroxychloroquine and and an order has been placed for a Hydroxychloroquine level,  you will need to have it drawn 4 hours or more after your last dose.  If you wish to have your labs drawn at another location, please call the office 24 hours in advance so we can fax the orders.  The office is located at [address], [address], [place], [place] [address]   If you have any questions regarding directions or hours of operation,  please call [phone].   As a reminder, please drink plenty of water prior to coming for your lab work. Thanks!  

## 2023-03-01 ENCOUNTER — Other Ambulatory Visit: Payer: Self-pay | Admitting: Nurse Practitioner

## 2023-03-01 NOTE — Telephone Encounter (Signed)
Please advise 

## 2023-04-06 ENCOUNTER — Ambulatory Visit (INDEPENDENT_AMBULATORY_CARE_PROVIDER_SITE_OTHER): Payer: 59 | Admitting: Neurology

## 2023-04-06 ENCOUNTER — Encounter: Payer: Self-pay | Admitting: Neurology

## 2023-04-06 VITALS — BP 116/68 | HR 72 | Ht 65.5 in | Wt 162.0 lb

## 2023-04-06 DIAGNOSIS — R202 Paresthesia of skin: Secondary | ICD-10-CM | POA: Diagnosis not present

## 2023-04-06 NOTE — Patient Instructions (Signed)
 Check labs 

## 2023-04-06 NOTE — Progress Notes (Signed)
Follow-up Visit   Date: 04/06/2023    CYNTIA Fleming MRN: [id number] DOB: 16-Mar-1966    Rebecca Fleming is a 57 y.o. right-handed Caucasian female with RA, history of left breast cancer s/p mastectomy (2009), and GERD returning to the clinic for follow-up of left facial numbness.  The patient was accompanied to the clinic by self.  IMPRESSION/PLAN: Left chin numbness.  Prior work-up has included MRI trigeminal nerve and brain which was unremarkable.  Serology testing in the past included ESR, CRP, SSA/B, SPEP with IFE, ACE, vitamin B12, folate of which ACE was elevated.  CXR was normal. I do not have a good explanation of her symptoms.  Fortunately, symptoms have resolved.  To be complete, I will check vitamin B12, SPEP/IFE.    Return to clinic as needed  --------------------------------------------- History of present illness: Starting in October 2022, she began having numbness involving the left lower lip and chin.  She saw her dentist who said she may have shingles and given acyclovir. Her symptoms have improved minimally, she continues to have constant numbness involving the left chin, lower lips, and gums.  She denies difficulty with swallowing/speech or facial weakness. She does not have any numbness/tingling of the right side of the face, arms or legs.  She also reports that her eyelashes fall out on the left eye.     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    UPDATE 04/06/2023:  She was last seen in December 2022 for left facial numbness.  MRI trigeminal nerve and brain was normal.  She was doing well until April 2024, when the she felt numbness involving the left chin towards her lower lip.  She was also having throbbing pain in the tooth pain and saw her dentist, who did not feel it was tooth-related. Symptoms were constant for three week.  No associated vision changes, speech changes, slurred speech, or limb weakness.   Medications:  Current Outpatient  Medications on File Prior to Visit  Medication Sig Dispense Refill   calcium carbonate (OS-CAL) 600 MG TABS tablet Take 600 mg by mouth 2 (two) times daily with a meal.     cetirizine (ZYRTEC) 10 MG tablet Take 10 mg by mouth daily.     fluticasone (FLONASE) 50 MCG/ACT nasal spray Place into both nostrils daily.     FOLIC ACID PO Take by mouth.     GLUCOSAMINE PO Take 2 tablets by mouth daily.     HUMIRA, 2 PEN, 40 MG/0.4ML PNKT INJECT 40MG  SUBCUTANEOUSLY EVERY OTHER WEEK 2 each 2   hydroxychloroquine (PLAQUENIL) 200 MG tablet Take 1 tablet by mouth twice daily Monday-Friday only 120 tablet 0   ibuprofen (ADVIL) 200 MG tablet Take 200 mg by mouth as needed.     Magnesium 500 MG TABS Take 1 tablet by mouth 2 (two) times daily.     Manganese 50 MG TABS Take 1 tablet by mouth daily.     pantoprazole (PROTONIX) 40 MG tablet TAKE 1 TABLET(40 MG) BY MOUTH DAILY 90 tablet 0   Polyvinyl Alcohol-Povidone (REFRESH OP) Apply to eye.     TURMERIC PO Take by mouth daily.     Adalimumab (HUMIRA Atkins) Inject 40 mg into the skin. (Patient not taking: Reported on 04/06/2023)     amoxicillin-clavulanate (AUGMENTIN) 500-125 MG tablet Take 1 tablet by mouth 2 (two) times daily. Take twice daily for one week. (Patient not taking: Reported on 02/10/2023) 14 tablet 0   gabapentin (NEURONTIN) 100  MG capsule Take 100 mg by mouth 3 (three) times daily. (Patient not taking: Reported on 04/06/2023)     predniSONE (DELTASONE) 5 MG tablet Take 4 tablets by mouth daily x2 days, 3 tablets daily x2 days, 2 tablets daily x2 days, 1 tablet daily x2 days. (Patient not taking: Reported on 04/06/2023) 20 tablet 0   promethazine (PHENERGAN) 25 MG tablet Take 25-50 mg by mouth every 8 (eight) hours as needed. (Patient not taking: Reported on 02/10/2023)     No current facility-administered medications on file prior to visit.    Allergies:  Allergies  Allergen Reactions   Latex Other (See Comments), Shortness Of Breath and Swelling     stuffiness Sinus Congestion; throat swells; "cannot blow up a balloon"   Yellow Dye Other (See Comments)    Patient is allergic to Yellow Dye #5 Lips Swell   Percocet [Oxycodone-Acetaminophen] Itching    Has had Vicodin- hydrocodone before without itching.  Benign side effect    Vital Signs:  BP 116/68   Pulse 72   Ht 5' 5.5" (1.664 m)   Wt 162 lb (73.5 kg)   LMP 11/12/2014   SpO2 95%   BMI 26.55 kg/m   Neurological Exam: MENTAL STATUS including orientation to time, place, person, recent and remote memory, attention span and concentration, language, and fund of knowledge is normal.  Speech is not dysarthric.  CRANIAL NERVES:  No visual field defects.  Pupils equal round and reactive to light.  Normal conjugate, extra-ocular eye movements in all directions of gaze.  No ptosis.  Pin prick reduced to 75% at the left chin, otherwise intact.  Vibration and temperature intact on the face throughout.  Face is symmetric. Palate elevates symmetrically.  Tongue is midline.  MOTOR:  Motor strength is 5/5 in all extremities.  No atrophy, fasciculations or abnormal movements.  No pronator drift.  Tone is normal.    MSRs:  Reflexes are 2+/4 throughout.  SENSORY:  Intact to vibration, temperature, and pin prick throughout.  COORDINATION/GAIT:  Normal finger-to- nose-finger.  Intact rapid alternating movements bilaterally.  Gait narrow based and stable. Stressed and tandem gait.   Data: Lab Results  Component Value Date   TSH 0.44 06/05/2021   Lab Results  Component Value Date   VITAMINB12 302 09/10/2021   Lab Results  Component Value Date   HGBA1C 5.3 03/18/2022   Lab Results  Component Value Date   FOLATE >23.4 09/10/2021     Thank you for allowing me to participate in patient's care.  If I can answer any additional questions, I would be pleased to do so.    Sincerely,    [place] K. Allena Katz, DO

## 2023-04-09 LAB — IMMUNOFIXATION ELECTROPHORESIS
IgG (Immunoglobin G), Serum: 878 mg/dL (ref 600–1640)
IgM, Serum: 105 mg/dL (ref 50–300)
Immunoglobulin A: 464 mg/dL — ABNORMAL HIGH (ref 47–310)

## 2023-04-09 LAB — PROTEIN ELECTROPHORESIS, SERUM
Albumin ELP: 4 g/dL (ref 3.8–4.8)
Alpha 1: 0.2 g/dL (ref 0.2–0.3)
Alpha 2: 0.6 g/dL (ref 0.5–0.9)
Beta 2: 0.5 g/dL (ref 0.2–0.5)
Beta Globulin: 0.5 g/dL (ref 0.4–0.6)
Gamma Globulin: 0.8 g/dL (ref 0.8–1.7)
Total Protein: 6.6 g/dL (ref 6.1–8.1)

## 2023-04-09 LAB — VITAMIN B12: Vitamin B-12: 423 pg/mL (ref 200–1100)

## 2023-04-23 ENCOUNTER — Other Ambulatory Visit: Payer: Self-pay | Admitting: Physician Assistant

## 2023-04-23 NOTE — Telephone Encounter (Signed)
Last Fill: 12/30/2022  Labs: 12/10/2022 Absolute eosinophils are borderline elevated but have improved. Rest of CBC Wnl. CMP WNL.  TB Gold: 12/10/2022  Neg   Next Visit: 07/14/2023  Last Visit: 02/10/2023  [id number] arthritis involving multiple sites with positive rheumatoid factor   Current Dose per office note 02/10/2023: Humira 40 mg subcutaneous injections every 14 days   Patient advised she is due to update lab work. Patient states she will update labs on Monday.   Okay to refill Humira?

## 2023-05-07 ENCOUNTER — Other Ambulatory Visit: Payer: Self-pay | Admitting: *Deleted

## 2023-05-07 DIAGNOSIS — Z79899 Other long term (current) drug therapy: Secondary | ICD-10-CM

## 2023-05-08 LAB — CBC WITH DIFFERENTIAL/PLATELET
Absolute Monocytes: 481 {cells}/uL (ref 200–950)
Basophils Absolute: 58 {cells}/uL (ref 0–200)
Basophils Relative: 1 %
Eosinophils Absolute: 249 {cells}/uL (ref 15–500)
Eosinophils Relative: 4.3 %
HCT: 40.4 % (ref 35.0–45.0)
Hemoglobin: 13.5 g/dL (ref 11.7–15.5)
Lymphs Abs: 1583 {cells}/uL (ref 850–3900)
MCH: 31 pg (ref 27.0–33.0)
MCHC: 33.4 g/dL (ref 32.0–36.0)
MCV: 92.7 fL (ref 80.0–100.0)
MPV: 11.1 fL (ref 7.5–12.5)
Monocytes Relative: 8.3 %
Neutro Abs: 3428 {cells}/uL (ref 1500–7800)
Neutrophils Relative %: 59.1 %
Platelets: 212 10*3/uL (ref 140–400)
RBC: 4.36 10*6/uL (ref 3.80–5.10)
RDW: 12.8 % (ref 11.0–15.0)
Total Lymphocyte: 27.3 %
WBC: 5.8 10*3/uL (ref 3.8–10.8)

## 2023-05-08 LAB — COMPLETE METABOLIC PANEL WITH GFR
AG Ratio: 1.8 (calc) (ref 1.0–2.5)
ALT: 14 U/L (ref 6–29)
AST: 17 U/L (ref 10–35)
Albumin: 4.2 g/dL (ref 3.6–5.1)
Alkaline phosphatase (APISO): 59 U/L (ref 37–153)
BUN: 16 mg/dL (ref 7–25)
CO2: 30 mmol/L (ref 20–32)
Calcium: 10.3 mg/dL (ref 8.6–10.4)
Chloride: 103 mmol/L (ref 98–110)
Creat: 0.82 mg/dL (ref 0.50–1.03)
Globulin: 2.4 g/dL (ref 1.9–3.7)
Glucose, Bld: 86 mg/dL (ref 65–99)
Potassium: 5.1 mmol/L (ref 3.5–5.3)
Sodium: 140 mmol/L (ref 135–146)
Total Bilirubin: 0.7 mg/dL (ref 0.2–1.2)
Total Protein: 6.6 g/dL (ref 6.1–8.1)
eGFR: 83 mL/min/{1.73_m2} (ref >=60)

## 2023-05-09 NOTE — Progress Notes (Signed)
CBC and CMP WNL

## 2023-05-10 ENCOUNTER — Other Ambulatory Visit: Payer: Self-pay | Admitting: *Deleted

## 2023-05-10 DIAGNOSIS — M0579 Rheumatoid arthritis with rheumatoid factor of multiple sites without organ or systems involvement: Secondary | ICD-10-CM

## 2023-05-10 MED ORDER — HYDROXYCHLOROQUINE SULFATE 200 MG PO TABS
ORAL_TABLET | ORAL | 0 refills | Status: DC
Start: 2023-05-10 — End: 2023-08-02

## 2023-05-10 NOTE — Telephone Encounter (Signed)
Refill request received via fax from [facility]  for Plaquenil  Last Fill: 11/02/2022   Eye exam: 12/16/2022 WNL    Labs: 05/07/2023 CBC and CMP WNL   Next Visit: 07/14/2023  Last Visit: 02/10/2023  DX: Rheumatoid arthritis involving multiple sites with positive rheumatoid factor   Current Dose per office note 02/10/2023: Plaquenil 200 mg 1 tablet by mouth twice daily, Monday through Friday only.   Okay to refill Plaquenil?

## 2023-05-14 ENCOUNTER — Other Ambulatory Visit: Payer: Self-pay | Admitting: Physician Assistant

## 2023-05-14 NOTE — Telephone Encounter (Signed)
Last Fill: 04/23/2023 (30 day supply)  Labs: 05/07/2023  TB Gold: 12/10/2022 Neg  Next Visit: 07/14/2023  Last Visit: 02/10/2023  [id number] arthritis involving multiple sites with positive rheumatoid factor   Current Dose per office note 02/10/2023: Humira 40 mg subcutaneous injections every 14 days   Okay to refill Humira?

## 2023-05-30 ENCOUNTER — Other Ambulatory Visit: Payer: Self-pay | Admitting: Nurse Practitioner

## 2023-06-02 NOTE — Progress Notes (Signed)
57 y.o. G2P2 Married Caucasian female here for annual exam.    Notes a pimple on her bottom.  Eventually they drain.   Doing well with tx of RA.   Taking D Mannose 2 every day and is not having UTIs.  PCP:   None  Patient's last menstrual period was 11/12/2014.           Sexually active: Yes.    The current method of family planning is status post hysterectomy.    Exercising: Yes.     Jazz exercise teacher Smoker:  no  Health Maintenance: Pap:  08/01/14 neg: HR HPV neg History of abnormal Pap:  no MMG:  2009 bilateral mastectomy Colonoscopy:  04/29/22 normal -due in 2028 BMD:   11/20/21  Result  osteopenia TDaP:  07/12/19 Gardasil:   no HIV: 11/27/20 neg Hep C: 2017 neg Screening Labs:  PCP   reports that she has never smoked. She has been exposed to tobacco smoke. She has never used smokeless tobacco. She reports current alcohol use of about 2.0 standard drinks of alcohol per week. She reports that she does not use drugs.  Past Medical History:  Diagnosis Date   Abnormal uterine bleeding    Allergy    Anemia    Arthritis    Asthma    exercised induced asthma - rarely uses inhaler;none since Chemo.   Blood transfusion without reported diagnosis 1976   1976  following surgery at [facility]    Cancer [facility]) 08/2008   left breast--has bilateral mastectomy   Complication of anesthesia    pt states" gets shakes" with general   Cyst near tailbone    Elevated LDL cholesterol level    Environmental and seasonal allergies    Fibroid    Headache    sleeps , no meds   Heart murmur    never has caused any problems   Helicobacter pylori infection    Hemorrhoids    hx of   History of breast cancer    History of shingles    03/07/18   Hypotension    hx of   Post-operative nausea and vomiting    RA (rheumatoid arthritis) (HCC)    RA (rheumatoid arthritis) (HCC)    Scoliosis    tx with robaxin prn - rarely uses   SVD (spontaneous vaginal delivery) 1996   x 1    Past Surgical  History:  Procedure Laterality Date   ABDOMINAL HYSTERECTOMY     APPENDECTOMY     BREAST LUMPECTOMY  05/17/2008   x 2 re excise for margin - Dr Jamey Ripa, bilateral mastectomy 08/2008   BREAST LUMPECTOMY W/ NEEDLE LOCALIZATION  04/23/2008   w [facility] Dr Jamey Ripa   CESAREAN SECTION  01/01/2004   Dr Edward Jolly   COLONOSCOPY  03/2018   COLONOSCOPY  05/05/2019   ENDOMETRIAL BIOPSY  06/2013, 2015   x 2   EYE SURGERY  2003   bilateral lasik   FOOT SURGERY     HYSTEROSCOPY WITH D & C  07/21/2011   Procedure: DILATATION AND CURETTAGE (D&C) /HYSTEROSCOPY;  Surgeon: Melony Overly;  Location: [facility];  Service: Gynecology;  Laterality: N/A;  with Ultrasound Guidance   INSERTION OF TISSUE EXPANDER AFTER MASTECTOMY  09/11/2008   Dr Odis Luster   KNEE ARTHROSCOPY W/ MENISCAL REPAIR Right 2010   --Dr. Priscille Kluver   LAPAROSCOPIC ASSISTED VAGINAL HYSTERECTOMY N/A 01/15/2015   Procedure: LAPAROSCOPIC ASSISTED VAGINAL HYSTERECTOMY;  Surgeon: Patton Salles, MD;  Location: [facility];  Service: Gynecology;  Laterality: N/A;   left foot surgery  2004   left rotator cuff repair  01/2006   MASTECTOMY  09/11/2008   bilateral - Dr Jamey Ripa   [facility] REMOVAL  09/09/2009   Dr Jamey Ripa   [facility] PLACEMENT  05/17/2008   Dr Salvadore Dom rounds of chemo   REMOVAL OF TISSUE EXPANDER AND PLACEMENT OF IMPLANT  12/2008   RHINOPLASTY     SALPINGOOPHORECTOMY Bilateral 01/15/2015   Procedure: BILATERAL SALPINGO OOPHORECTOMY;  Surgeon: Patton Salles, MD;  Location: [facility];  Service: Gynecology;  Laterality: Bilateral;   UMBILICAL HERNIA REPAIR  09/09/2009   Dr Jamey Ripa   UPPER GI ENDOSCOPY  03/2018   WISDOM TOOTH EXTRACTION      Current Outpatient Medications  Medication Sig Dispense Refill   calcium carbonate (OS-CAL) 600 MG TABS tablet Take 600 mg by mouth 2 (two) times daily with a meal.     cetirizine (ZYRTEC) 10 MG tablet Take 10 mg by mouth daily.     fluticasone (FLONASE) 50 MCG/ACT nasal spray Place into both nostrils  daily.     FOLIC ACID PO Take by mouth.     GLUCOSAMINE PO Take 2 tablets by mouth daily.     HUMIRA, 2 PEN, 40 MG/0.4ML pen INJECT 1 PEN SUBCUTANEOUSLY  EVERY OTHER WEEK 2 each 2   hydroxychloroquine (PLAQUENIL) 200 MG tablet Take 1 tablet by mouth twice daily Monday-Friday only 120 tablet 0   ibuprofen (ADVIL) 200 MG tablet Take 200 mg by mouth as needed.     Magnesium 500 MG TABS Take 1 tablet by mouth 2 (two) times daily.     Manganese 50 MG TABS Take 1 tablet by mouth daily.     pantoprazole (PROTONIX) 40 MG tablet TAKE 1 TABLET(40 MG) BY MOUTH DAILY 90 tablet 1   Polyvinyl Alcohol-Povidone (REFRESH OP) Apply to eye.     TURMERIC PO Take by mouth daily.     No current facility-administered medications for this visit.    Family History  Problem Relation Age of Onset   Breast cancer Mother    Diabetes Mother    Hypertension Mother    Stroke Mother    Irritable bowel syndrome Mother    Kidney disease Mother    Heart failure Father    Hypertension Father    Colon cancer Neg Hx    Colon polyps Neg Hx    Esophageal cancer Neg Hx    Pancreatic cancer Neg Hx    Rectal cancer Neg Hx    Stomach cancer Neg Hx     Review of Systems  All other systems reviewed and are negative.   Exam:   BP 126/82 (BP Location: Right Arm, Patient Position: Sitting, Cuff Size: Normal)   Pulse 83   Ht 5' 5.5" (1.664 m)   Wt 161 lb (73 kg)   LMP 11/12/2014   SpO2 95%   BMI 26.38 kg/m     General appearance: alert, cooperative and appears stated age Head: normocephalic, without obvious abnormality, atraumatic Neck: no adenopathy, supple, symmetrical, trachea midline and thyroid normal to inspection and palpation Lungs: clear to auscultation bilaterally Breasts: absent. Bilateral implants present.  No axillary adenopathy. Heart: regular rate and rhythm Abdomen: soft, non-tender; no masses, no organomegaly Extremities: extremities normal, atraumatic, no cyanosis or edema Skin: skin color,  texture, turgor normal. No rashes or lesions Lymph nodes: cervical, supraclavicular, and axillary nodes normal. Neurologic: grossly normal  Pelvic: External genitalia:  no lesions  No abnormal inguinal nodes palpated.              Urethra:  normal appearing urethra with no masses, tenderness or lesions              Bartholins and Skenes: normal                 Vagina: normal appearing vagina with normal color and discharge, no lesions.    Atrpophy.              Cervix: absent.              Pap taken: no Bimanual Exam:  Uterus: absent              Adnexa: no mass, fullness, tenderness              Rectal exam: yes.  Confirms.              Anus:  normal sphincter tone, no lesions  Chaperone was present for exam:  Irving Burton  Assessment:   Well woman visit with gynecologic exam. Status post bilateral mastectomy with reconstruction.  Status post LAVH/BSO.  Vaginal atropohy. Osteopenia.  RA.  On Humira.  Hx steroid use.  Plan: Mammogram screening discussed. Self breast awareness reviewed. Pap and HR HPV not indicated.  Guidelines for Calcium, Vitamin D, regular exercise program including cardiovascular and weight bearing exercise. BMD 2025.   Vit E for vaginal atrophy. Follow up annually and prn.

## 2023-06-07 ENCOUNTER — Telehealth: Payer: Self-pay | Admitting: Pharmacist

## 2023-06-07 NOTE — Telephone Encounter (Signed)
Received notification from [employer] regarding a prior authorization for HUMIRA. Authorization has been APPROVED from 06/07/23 to 06/06/2024. Approval letter sent to scan center.  Patient must continue to fill through [facility]: [phone]   Authorization #  [id number] Phone # [phone]  Chesley Mires, PharmD, MPH, BCPS, CPP Clinical Pharmacist (Rheumatology and Pulmonology)

## 2023-06-07 NOTE — Telephone Encounter (Signed)
Patient due for PA renewal for Humira per Complete Pro portal. Submitted a Prior Authorization renewal request to [facility] for HUMIRA via CoverMyMeds. Will update once we receive a response.  Key: Raeanne Barry, PharmD, MPH, BCPS, CPP Clinical Pharmacist (Rheumatology and Pulmonology)

## 2023-06-10 ENCOUNTER — Ambulatory Visit: Payer: 59 | Admitting: Obstetrics and Gynecology

## 2023-06-16 ENCOUNTER — Encounter: Payer: Self-pay | Admitting: Obstetrics and Gynecology

## 2023-06-16 ENCOUNTER — Ambulatory Visit: Payer: 59 | Admitting: Obstetrics and Gynecology

## 2023-06-16 VITALS — BP 126/82 | HR 83 | Ht 65.5 in | Wt 161.0 lb

## 2023-06-16 DIAGNOSIS — Z01419 Encounter for gynecological examination (general) (routine) without abnormal findings: Secondary | ICD-10-CM

## 2023-06-16 NOTE — Patient Instructions (Signed)

## 2023-06-30 NOTE — Progress Notes (Deleted)
Office Visit Note  Patient: Rebecca Fleming             Date of Birth: 02/26/1966           MRN: [id number]             PCP: Patton Salles, MD Referring: Janean Sark* Visit Date: 07/14/2023 Occupation: @GUAROCC @  Subjective:    History of Present Illness: Rebecca Fleming is a 57 y.o. female with history of seropositive rheumatoid arthritis and osteoarthritis.  Patient remains on  Humira 40 mg subcutaneous injections every 14 days and Plaquenil 200 mg 1 tablet by mouth twice daily, Monday through Friday only.   CBC and CMP updated on 05/07/23.  TB gold negative on 12/10/22.   Discussed the importance of holding humira if she develops signs or symptoms of an infection and to resume once the infection has completely cleared.     Activities of Daily Living:  Patient reports morning stiffness for *** {minute/hour:19697}.   Patient {ACTIONS;DENIES/REPORTS:21021675::"Denies"} nocturnal pain.  Difficulty dressing/grooming: {ACTIONS;DENIES/REPORTS:21021675::"Denies"} Difficulty climbing stairs: {ACTIONS;DENIES/REPORTS:21021675::"Denies"} Difficulty getting out of chair: {ACTIONS;DENIES/REPORTS:21021675::"Denies"} Difficulty using hands for taps, buttons, cutlery, and/or writing: {ACTIONS;DENIES/REPORTS:21021675::"Denies"}  No Rheumatology ROS completed.   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   Blood transfusion without reported diagnosis 1976   1976  following surgery at [facility]    Cancer [facility]) 08/2008   left breast--has bilateral mastectomy   Complication of anesthesia    pt states" gets shakes" with general   Cyst near tailbone    Elevated LDL cholesterol level    Environmental and seasonal allergies    Fibroid    Headache    sleeps , no meds   Heart murmur    never has caused any problems   Helicobacter pylori infection    Hemorrhoids    hx of   History of breast cancer    History of shingles    03/07/18   Hypotension    hx of   Post-operative nausea and vomiting    RA (rheumatoid arthritis) (HCC)    RA (rheumatoid arthritis) (HCC)    Scoliosis    tx with robaxin prn - rarely uses   SVD (spontaneous vaginal delivery) 1996   x 1    Family History  Problem Relation Age of Onset   Breast cancer Mother    Diabetes Mother    Hypertension Mother    Stroke Mother    Irritable bowel syndrome Mother    Kidney disease Mother    Heart failure Father    Hypertension Father    Colon cancer Neg Hx    Colon polyps Neg Hx    Esophageal cancer Neg Hx  Pancreatic cancer Neg Hx    Rectal cancer Neg Hx    Stomach cancer Neg Hx    Past Surgical History:  Procedure Laterality Date   ABDOMINAL HYSTERECTOMY     APPENDECTOMY     BREAST LUMPECTOMY  05/17/2008   x 2 re excise for margin - Dr Jamey Ripa, bilateral mastectomy 08/2008   BREAST LUMPECTOMY W/ NEEDLE LOCALIZATION  04/23/2008   w [facility] Dr Jamey Ripa   CESAREAN SECTION  01/01/2004   Dr Edward Jolly   COLONOSCOPY  03/2018   COLONOSCOPY  05/05/2019   ENDOMETRIAL BIOPSY  06/2013, 2015   x 2   EYE SURGERY  2003   bilateral lasik   FOOT SURGERY     HYSTEROSCOPY WITH D & C  07/21/2011   Procedure: DILATATION AND CURETTAGE (D&C)  /HYSTEROSCOPY;  Surgeon: Melony Overly;  Location: [facility];  Service: Gynecology;  Laterality: N/A;  with Ultrasound Guidance   INSERTION OF TISSUE EXPANDER AFTER MASTECTOMY  09/11/2008   Dr Odis Luster   KNEE ARTHROSCOPY W/ MENISCAL REPAIR Right 2010   --Dr. Priscille Kluver   LAPAROSCOPIC ASSISTED VAGINAL HYSTERECTOMY N/A 01/15/2015   Procedure: LAPAROSCOPIC ASSISTED VAGINAL HYSTERECTOMY;  Surgeon: Patton Salles, MD;  Location: [facility];  Service: Gynecology;  Laterality: N/A;   left foot surgery  2004   left rotator cuff repair  01/2006   MASTECTOMY  09/11/2008   bilateral - Dr Jamey Ripa   [facility] REMOVAL  09/09/2009   Dr Jamey Ripa   [facility]  05/17/2008   Dr Salvadore Dom rounds of chemo   REMOVAL OF TISSUE EXPANDER AND PLACEMENT OF IMPLANT  12/2008   RHINOPLASTY     SALPINGOOPHORECTOMY Bilateral 01/15/2015   Procedure: BILATERAL SALPINGO OOPHORECTOMY;  Surgeon: Patton Salles, MD;  Location: [facility];  Service: Gynecology;  Laterality: Bilateral;   UMBILICAL HERNIA REPAIR  09/09/2009   Dr Jamey Ripa   UPPER GI ENDOSCOPY  03/2018   WISDOM TOOTH EXTRACTION     Social History   Social History Narrative   Right Handed    Lives in a two story home. Main living on first floor.   Immunization History  Administered Date(s) Administered   Influenza,inj,Quad PF,6+ Mos 07/12/2019   PFIZER(Purple Top)SARS-COV-2 Vaccination 10/05/2019, 10/25/2019, 05/11/2020   Pneumococcal Polysaccharide-23 01/16/2015   Tdap 09/21/2010, 07/12/2019     Objective: Vital Signs: LMP 11/12/2014    Physical Exam Vitals and nursing note reviewed.  Constitutional:      Appearance: She is well-developed.  HENT:     Head: Normocephalic and atraumatic.  Eyes:     Conjunctiva/sclera: Conjunctivae normal.  Cardiovascular:     Rate and Rhythm: Normal rate and regular rhythm.     Heart sounds: Normal heart sounds.  Pulmonary:     Effort: Pulmonary effort is normal.     Breath sounds: Normal breath  sounds.  Abdominal:     General: Bowel sounds are normal.     Palpations: Abdomen is soft.  Musculoskeletal:     Cervical back: Normal range of motion.  Lymphadenopathy:     Cervical: No cervical adenopathy.  Skin:    General: Skin is warm and dry.     Capillary Refill: Capillary refill takes less than 2 seconds.  Neurological:     Mental Status: She is alert and oriented to person, place, and time.  Psychiatric:        Behavior: Behavior normal.      Musculoskeletal Exam: ***  CDAI Exam: CDAI Score: -- Patient  Global: --; Provider Global: -- Swollen: --; Tender: -- Joint Exam 07/14/2023   No joint exam has been documented for this visit   There is currently no information documented on the homunculus. Go to the Rheumatology activity and complete the homunculus joint exam.  Investigation: No additional findings.  Imaging: No results found.  Recent Labs: Lab Results  Component Value Date   WBC 5.8 05/07/2023   HGB 13.5 05/07/2023   PLT 212 05/07/2023   NA 140 05/07/2023   K 5.1 05/07/2023   CL 103 05/07/2023   CO2 30 05/07/2023   GLUCOSE 86 05/07/2023   BUN 16 05/07/2023   CREATININE 0.82 05/07/2023   BILITOT 0.7 05/07/2023   ALKPHOS 66 03/31/2017   AST 17 05/07/2023   ALT 14 05/07/2023   PROT 6.6 05/07/2023   ALBUMIN 4.1 03/31/2017   CALCIUM 10.3 05/07/2023   GFRAA 105 02/24/2021   QFTBGOLDPLUS NEGATIVE 12/10/2022    Speciality Comments: PLQ Eye Exam: 12/16/2022 WNL @ [facility] [phone] . Follow up in 1 year. Humira 09/22 IN CARE EVERYWHERE    Procedures:  No procedures performed Allergies: Latex, Yellow dye, and Percocet [oxycodone-acetaminophen]   Assessment / Plan:     Visit Diagnoses: Rheumatoid arthritis involving multiple sites with positive rheumatoid factor (HCC)  High risk medication use  Primary osteoarthritis of both hands  Dupuytren's contracture of right hand  Primary osteoarthritis of  both knees  Chondromalacia of left patella  DDD (degenerative disc disease), cervical  Psoriasis  Osteopenia of multiple sites  History of breast cancer  Heart murmur  History of asthma  History of anemia  History of shingles  Orders: No orders of the defined types were placed in this encounter.  No orders of the defined types were placed in this encounter.   Face-to-face time spent with patient was *** minutes. Greater than 50% of time was spent in counseling and coordination of care.  Follow-Up Instructions: No follow-ups on file.   Gearldine Bienenstock, PA-C  Note - This record has been created using Dragon software.  Chart creation errors have been sought, but may not always  have been located. Such creation errors do not reflect on  the standard of medical care.

## 2023-07-14 ENCOUNTER — Ambulatory Visit: Payer: 59 | Admitting: Physician Assistant

## 2023-07-14 DIAGNOSIS — M170 Bilateral primary osteoarthritis of knee: Secondary | ICD-10-CM

## 2023-07-14 DIAGNOSIS — M2242 Chondromalacia patellae, left knee: Secondary | ICD-10-CM

## 2023-07-14 DIAGNOSIS — Z8709 Personal history of other diseases of the respiratory system: Secondary | ICD-10-CM

## 2023-07-14 DIAGNOSIS — M19041 Primary osteoarthritis, right hand: Secondary | ICD-10-CM

## 2023-07-14 DIAGNOSIS — Z862 Personal history of diseases of the blood and blood-forming organs and certain disorders involving the immune mechanism: Secondary | ICD-10-CM

## 2023-07-14 DIAGNOSIS — Z853 Personal history of malignant neoplasm of breast: Secondary | ICD-10-CM

## 2023-07-14 DIAGNOSIS — M0579 Rheumatoid arthritis with rheumatoid factor of multiple sites without organ or systems involvement: Secondary | ICD-10-CM

## 2023-07-14 DIAGNOSIS — M8589 Other specified disorders of bone density and structure, multiple sites: Secondary | ICD-10-CM

## 2023-07-14 DIAGNOSIS — L409 Psoriasis, unspecified: Secondary | ICD-10-CM

## 2023-07-14 DIAGNOSIS — M5030 Other cervical disc degeneration, unspecified cervical region: Secondary | ICD-10-CM

## 2023-07-14 DIAGNOSIS — Z79899 Other long term (current) drug therapy: Secondary | ICD-10-CM

## 2023-07-14 DIAGNOSIS — Z8619 Personal history of other infectious and parasitic diseases: Secondary | ICD-10-CM

## 2023-07-14 DIAGNOSIS — R011 Cardiac murmur, unspecified: Secondary | ICD-10-CM

## 2023-07-14 DIAGNOSIS — M720 Palmar fascial fibromatosis [Dupuytren]: Secondary | ICD-10-CM

## 2023-07-14 NOTE — Progress Notes (Deleted)
Office Visit Note  Patient: Rebecca Fleming             Date of Birth: 1965/12/08           MRN: [id number]             PCP: Patton Salles, MD Referring: Janean Sark* Visit Date: 07/28/2023 Occupation: @GUAROCC @  Subjective:    History of Present Illness: Rebecca Fleming is a 57 y.o. female with history of seropositive rheumatoid arthritis and osteoarthritis.  Patient remains on  Humira 40 mg subcutaneous injections every 14 days and Plaquenil 200 mg 1 tablet by mouth twice daily, Monday through Friday only.   CBC and CMP updated on 05/07/23.  TB gold negative on 12/10/22.   Discussed the importance of holding humira if she develops signs or symptoms of an infection and to resume once the infection has completely cleared.     Activities of Daily Living:  Patient reports morning stiffness for *** {minute/hour:19697}.   Patient {ACTIONS;DENIES/REPORTS:21021675::"Denies"} nocturnal pain.  Difficulty dressing/grooming: {ACTIONS;DENIES/REPORTS:21021675::"Denies"} Difficulty climbing stairs: {ACTIONS;DENIES/REPORTS:21021675::"Denies"} Difficulty getting out of chair: {ACTIONS;DENIES/REPORTS:21021675::"Denies"} Difficulty using hands for taps, buttons, cutlery, and/or writing: {ACTIONS;DENIES/REPORTS:21021675::"Denies"}  No Rheumatology ROS completed.   PMFS History:  Patient Active Problem List   Diagnosis Date Noted  . Primary osteoarthritis of both knees 09/01/2017  . Scoliosis 09/01/2017  . High risk medication use 11/11/2016  . Primary osteoarthritis of both hands 11/11/2016  . Lateral epicondylitis, right elbow 11/11/2016  . Myopia with astigmatism and presbyopia, bilateral 09/09/2016  . 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 Status post laparoscopic assisted vaginal hysterectomy (LAVH) 01/15/2015  . Breast cancer, left breast (HCC) 11/07/2014  . Postmenopausal bleeding 07/13/2013  . Rheumatoid arthritis (HCC) 01/11/2012  .  Psoriasis 01/11/2012    Past Medical History:  Diagnosis Date  . Abnormal uterine bleeding   . Allergy   . Anemia   . Arthritis   . Asthma    exercised induced asthma - rarely uses inhaler;none since Chemo.  . Blood transfusion without reported diagnosis 1976   1976  following surgery at [facility]   . Cancer (HCC) 08/2008   left breast--has bilateral mastectomy  . Complication of anesthesia    pt states" gets shakes" with general  . Cyst near tailbone   . Elevated LDL cholesterol level   . Environmental and seasonal allergies   . Fibroid   . Headache    sleeps , no meds  . Heart murmur    never has caused any problems  . Helicobacter pylori infection   . Hemorrhoids    hx of  . History of breast cancer   . History of shingles    03/07/18  . Hypotension    hx of  . Post-operative nausea and vomiting   . RA (rheumatoid arthritis) (HCC)   . RA (rheumatoid arthritis) (HCC)   . Scoliosis    tx with robaxin prn - rarely uses  . SVD (spontaneous vaginal delivery) 1996   x 1    Family History  Problem Relation Age of Onset  . Breast cancer Mother   . Diabetes Mother   . Hypertension Mother   . Stroke Mother   . Irritable bowel syndrome Mother   . Kidney disease Mother   . Heart failure Father   . Hypertension Father   . Colon cancer Neg Hx   . Colon polyps Neg Hx   . Esophageal cancer Neg Hx   .  Pancreatic cancer Neg Hx   . Rectal cancer Neg Hx   . Stomach cancer Neg Hx    Past Surgical History:  Procedure Laterality Date  . ABDOMINAL HYSTERECTOMY    . APPENDECTOMY    . BREAST LUMPECTOMY  05/17/2008   x 2 re excise for margin - Dr Jamey Ripa, bilateral mastectomy 08/2008  . BREAST LUMPECTOMY W/ NEEDLE LOCALIZATION  04/23/2008   w SLN Dr Jamey Ripa  . CESAREAN SECTION  01/01/2004   Dr Edward Jolly  . COLONOSCOPY  03/2018  . COLONOSCOPY  05/05/2019  . ENDOMETRIAL BIOPSY  06/2013, 2015   x 2  . EYE SURGERY  2003   bilateral lasik  . FOOT SURGERY    . HYSTEROSCOPY WITH D & C   07/21/2011   Procedure: DILATATION AND CURETTAGE (D&C) /HYSTEROSCOPY;  Surgeon: Melony Overly;  Location: [facility];  Service: Gynecology;  Laterality: N/A;  with Ultrasound Guidance  . INSERTION OF TISSUE EXPANDER AFTER MASTECTOMY  09/11/2008   Dr Odis Luster  . KNEE ARTHROSCOPY W/ MENISCAL REPAIR Right 2010   --Dr. Priscille Kluver  . LAPAROSCOPIC ASSISTED VAGINAL HYSTERECTOMY N/A 01/15/2015   Procedure: LAPAROSCOPIC ASSISTED VAGINAL HYSTERECTOMY;  Surgeon: Patton Salles, MD;  Location: [facility];  Service: Gynecology;  Laterality: N/A;  . left foot surgery  2004  . left rotator cuff repair  01/2006  . MASTECTOMY  09/11/2008   bilateral - Dr Jamey Ripa  . PORT-A-CATH REMOVAL  09/09/2009   Dr Jamey Ripa  . PORTACATH PLACEMENT  05/17/2008   Dr Salvadore Dom rounds of chemo  . REMOVAL OF TISSUE EXPANDER AND PLACEMENT OF IMPLANT  12/2008  . RHINOPLASTY    . SALPINGOOPHORECTOMY Bilateral 01/15/2015   Procedure: BILATERAL SALPINGO OOPHORECTOMY;  Surgeon: Patton Salles, MD;  Location: [facility];  Service: Gynecology;  Laterality: Bilateral;  . UMBILICAL HERNIA REPAIR  09/09/2009   Dr Jamey Ripa  . UPPER GI ENDOSCOPY  03/2018  . WISDOM TOOTH EXTRACTION     Social History   Social History Narrative   Right Handed    Lives in a two story home. Main living on first floor.   Immunization History  Administered Date(s) Administered  . Influenza,inj,Quad PF,6+ Mos 07/12/2019  . PFIZER(Purple Top)SARS-COV-2 Vaccination 10/05/2019, 10/25/2019, 05/11/2020  . Pneumococcal Polysaccharide-23 01/16/2015  . Tdap 09/21/2010, 07/12/2019     Objective: Vital Signs: LMP 11/12/2014    Physical Exam Vitals and nursing note reviewed.  Constitutional:      Appearance: She is well-developed.  HENT:     Head: Normocephalic and atraumatic.  Eyes:     Conjunctiva/sclera: Conjunctivae normal.  Cardiovascular:     Rate and Rhythm: Normal rate and regular rhythm.     Heart sounds: Normal heart sounds.  Pulmonary:      Effort: Pulmonary effort is normal.     Breath sounds: Normal breath sounds.  Abdominal:     General: Bowel sounds are normal.     Palpations: Abdomen is soft.  Musculoskeletal:     Cervical back: Normal range of motion.  Lymphadenopathy:     Cervical: No cervical adenopathy.  Skin:    General: Skin is warm and dry.     Capillary Refill: Capillary refill takes less than 2 seconds.  Neurological:     Mental Status: She is alert and oriented to person, place, and time.  Psychiatric:        Behavior: Behavior normal.     Musculoskeletal Exam: ***  CDAI Exam: CDAI Score: -- Patient Global: --;  Provider Global: -- Swollen: --; Tender: -- Joint Exam 07/28/2023   No joint exam has been documented for this visit   There is currently no information documented on the homunculus. Go to the Rheumatology activity and complete the homunculus joint exam.  Investigation: No additional findings.  Imaging: No results found.  Recent Labs: Lab Results  Component Value Date   WBC 5.8 05/07/2023   HGB 13.5 05/07/2023   PLT 212 05/07/2023   NA 140 05/07/2023   K 5.1 05/07/2023   CL 103 05/07/2023   CO2 30 05/07/2023   GLUCOSE 86 05/07/2023   BUN 16 05/07/2023   CREATININE 0.82 05/07/2023   BILITOT 0.7 05/07/2023   ALKPHOS 66 03/31/2017   AST 17 05/07/2023   ALT 14 05/07/2023   PROT 6.6 05/07/2023   ALBUMIN 4.1 03/31/2017   CALCIUM 10.3 05/07/2023   GFRAA 105 02/24/2021   QFTBGOLDPLUS NEGATIVE 12/10/2022    Speciality Comments: PLQ Eye Exam: 12/16/2022 WNL @ [facility] Opthalmology [facility] [phone] . Follow up in 1 year. Humira 09/22 IN CARE EVERYWHERE    Procedures:  No procedures performed Allergies: Latex, Yellow dye, and Percocet [oxycodone-acetaminophen]   Assessment / Plan:     Visit Diagnoses: Rheumatoid arthritis involving multiple sites with positive rheumatoid factor (HCC)  High risk medication use  Primary osteoarthritis of both  hands  Dupuytren's contracture of right hand  Primary osteoarthritis of both knees  Chondromalacia of left patella  DDD (degenerative disc disease), cervical  Psoriasis  Osteopenia of multiple sites  History of breast cancer  Heart murmur  History of asthma  History of anemia  History of shingles  Orders: No orders of the defined types were placed in this encounter.  No orders of the defined types were placed in this encounter.   Face-to-face time spent with patient was *** minutes. Greater than 50% of time was spent in counseling and coordination of care.  Follow-Up Instructions: No follow-ups on file.   Gearldine Bienenstock, PA-C  Note - This record has been created using Dragon software.  Chart creation errors have been sought, but may not always  have been located. Such creation errors do not reflect on  the standard of medical care.

## 2023-07-28 ENCOUNTER — Ambulatory Visit: Payer: 59 | Admitting: Physician Assistant

## 2023-07-28 DIAGNOSIS — M720 Palmar fascial fibromatosis [Dupuytren]: Secondary | ICD-10-CM

## 2023-07-28 DIAGNOSIS — Z8709 Personal history of other diseases of the respiratory system: Secondary | ICD-10-CM

## 2023-07-28 DIAGNOSIS — Z862 Personal history of diseases of the blood and blood-forming organs and certain disorders involving the immune mechanism: Secondary | ICD-10-CM

## 2023-07-28 DIAGNOSIS — Z79899 Other long term (current) drug therapy: Secondary | ICD-10-CM

## 2023-07-28 DIAGNOSIS — Z853 Personal history of malignant neoplasm of breast: Secondary | ICD-10-CM

## 2023-07-28 DIAGNOSIS — M0579 Rheumatoid arthritis with rheumatoid factor of multiple sites without organ or systems involvement: Secondary | ICD-10-CM

## 2023-07-28 DIAGNOSIS — R011 Cardiac murmur, unspecified: Secondary | ICD-10-CM

## 2023-07-28 DIAGNOSIS — M2242 Chondromalacia patellae, left knee: Secondary | ICD-10-CM

## 2023-07-28 DIAGNOSIS — M5030 Other cervical disc degeneration, unspecified cervical region: Secondary | ICD-10-CM

## 2023-07-28 DIAGNOSIS — Z8619 Personal history of other infectious and parasitic diseases: Secondary | ICD-10-CM

## 2023-07-28 DIAGNOSIS — M8589 Other specified disorders of bone density and structure, multiple sites: Secondary | ICD-10-CM

## 2023-07-28 DIAGNOSIS — L409 Psoriasis, unspecified: Secondary | ICD-10-CM

## 2023-07-28 DIAGNOSIS — M170 Bilateral primary osteoarthritis of knee: Secondary | ICD-10-CM

## 2023-07-28 DIAGNOSIS — M19041 Primary osteoarthritis, right hand: Secondary | ICD-10-CM

## 2023-07-29 NOTE — Progress Notes (Signed)
Office Visit Note  Patient: KETZALY GRIESEL             Date of Birth: 06-01-1966           MRN: [id number]             PCP: Patton Salles, MD Referring: Janean Sark* Visit Date: 08/02/2023 Occupation: @GUAROCC @  Subjective:  Medication monitoring  History of Present Illness: Rebecca Fleming is a 57 y.o. female with history of seropositive rheumatoid arthritis and osteoarthritis.  Patient remains on  Humira 40 mg subcutaneous injections every 14 days and Plaquenil 200 mg 1 tablet by mouth twice daily, Monday through Friday only.  She is tolerating combination therapy without any side effects and has not had any interruptions in therapy.  She denies any signs or symptoms of a rheumatoid arthritis flare.  She denies any morning stiffness, nocturnal pain, or difficulty with ADLs.  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  Patient states last week she had a mild upper respiratory tract infection.  She denies any fevers but has been taking Mucinex in the morning for congestion which has been helpful.  She denies any other new medical conditions or recurrent infections.    Activities of Daily Living:  Patient reports morning stiffness for 0 minutes.   Patient Denies nocturnal pain.  Difficulty dressing/grooming: Denies Difficulty climbing stairs: Denies Difficulty getting out of chair: Denies Difficulty using hands for taps, buttons, cutlery, and/or writing: Denies  Review of Systems  Constitutional:  Positive for fatigue.  HENT:  Negative for mouth sores and mouth dryness.   Eyes:  Positive for dryness. Negative for pain and visual disturbance.  Respiratory:  Positive for cough and wheezing. Negative for shortness of breath.   Cardiovascular:  Negative for chest pain and palpitations.  Gastrointestinal:  Negative for blood in stool, constipation and diarrhea.  Endocrine: Negative for increased urination.  Genitourinary:  Negative for  involuntary urination.  Musculoskeletal:  Negative for joint pain, gait problem, joint pain, joint swelling, myalgias, muscle weakness, morning stiffness, muscle tenderness and myalgias.  Skin:  Positive for rash and sensitivity to sunlight. Negative for color change and hair loss.  Allergic/Immunologic: Negative for susceptible to infections.  Neurological:  Negative for dizziness and headaches.  Hematological:  Negative for swollen glands.  Psychiatric/Behavioral:  Positive for sleep disturbance. Negative for depressed mood. The patient is not nervous/anxious.     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   Blood transfusion without reported diagnosis 1976   1976  following surgery at [facility]    Cancer [facility]) 08/2008   left breast--has bilateral mastectomy   Complication of anesthesia    pt states"  gets shakes" with general   Cyst near tailbone    Elevated LDL cholesterol level    Environmental and seasonal allergies    Fibroid    Headache    sleeps , no meds   Heart murmur    never has caused any problems   Helicobacter pylori infection    Hemorrhoids    hx of   History of breast cancer    History of shingles    03/07/18    Hypotension    hx of   Post-operative nausea and vomiting    RA (rheumatoid arthritis) (HCC)    RA (rheumatoid arthritis) (HCC)    Scoliosis    tx with robaxin prn - rarely uses   SVD (spontaneous vaginal delivery) 1996   x 1    Family History  Problem Relation Age of Onset   Breast cancer Mother    Diabetes Mother    Hypertension Mother    Stroke Mother    Irritable bowel syndrome Mother    Kidney disease Mother    Heart failure Father    Hypertension Father    Colon cancer Neg Hx    Colon polyps Neg Hx    Esophageal cancer Neg Hx    Pancreatic cancer Neg Hx    Rectal cancer Neg Hx    Stomach cancer Neg Hx    Past Surgical History:  Procedure Laterality Date   ABDOMINAL HYSTERECTOMY     APPENDECTOMY     BREAST LUMPECTOMY  05/17/2008   x 2 re excise for margin - Dr Jamey Ripa, bilateral mastectomy 08/2008   BREAST LUMPECTOMY W/ NEEDLE LOCALIZATION  04/23/2008   w SL[facility] Dr Jamey Ripa   CESAREAN SECTION  01/01/2004   Dr Edward Jolly   COLONOSCOPY  03/2018   COLONOSCOPY  05/05/2019   ENDOMETRIAL BIOPSY  06/2013, 2015   x 2   EYE SURGERY  2003   bilateral lasik   FOOT SURGERY     HYSTEROSCOPY WITH D & C  07/21/2011   Procedure: DILATATION AND CURETTAGE (D&C) /HYSTEROSCOPY;  Surgeon: Melony Overly;  Location: [facility];  Service: Gynecology;  Laterality: N/A;  with Ultrasound Guidance   INSERTION OF TISSUE EXPANDER AFTER MASTECTOMY  09/11/2008   Dr Odis Luster   KNEE ARTHROSCOPY W/ MENISCAL REPAIR Right 2010   --Dr. Priscille Kluver   LAPAROSCOPIC ASSISTED VAGINAL HYSTERECTOMY N/A 01/15/2015   Procedure: LAPAROSCOPIC ASSISTED VAGINAL HYSTERECTOMY;  Surgeon: Patton Salles, MD;  Location: [facility];  Service: Gynecology;  Laterality: N/A;   left foot surgery  2004   left rotator cuff repair  01/2006   MASTECTOMY  09/11/2008   bilateral - Dr Jamey Ripa   [facility] REMOVAL  09/09/2009   Dr Jamey Ripa   [facility] PLACEMENT  05/17/2008   Dr Salvadore Dom rounds of chemo   REMOVAL OF TISSUE EXPANDER AND  PLACEMENT OF IMPLANT  12/2008   RHINOPLASTY     SALPINGOOPHORECTOMY Bilateral 01/15/2015   Procedure: BILATERAL SALPINGO OOPHORECTOMY;  Surgeon: Patton Salles, MD;  Location: [facility];  Service: Gynecology;  Laterality: Bilateral;   UMBILICAL HERNIA REPAIR  09/09/2009   Dr Jamey Ripa   UPPER GI ENDOSCOPY  03/2018   WISDOM TOOTH EXTRACTION     Social History   Social History Narrative   Right Handed    Lives in a two story home. Main living on first floor.   Immunization History  Administered Date(s) Administered   Influenza,inj,Quad PF,6+ Mos 07/12/2019   PFIZER(Purple Top)SARS-COV-2 Vaccination 10/05/2019, 10/25/2019, 05/11/2020  Pneumococcal Polysaccharide-23 01/16/2015   Tdap 09/21/2010, 07/12/2019     Objective: Vital Signs: BP 112/71 (BP Location: Right Arm, Patient Position: Sitting, Cuff Size: Normal)   Pulse 69   Resp 16   Ht 5' 5.5" (1.664 m)   Wt 159 lb 9.6 oz (72.4 kg)   LMP 11/12/2014   BMI 26.15 kg/m    Physical Exam Vitals and nursing note reviewed.  Constitutional:      Appearance: She is well-developed.  HENT:     Head: Normocephalic and atraumatic.  Eyes:     Conjunctiva/sclera: Conjunctivae normal.  Cardiovascular:     Rate and Rhythm: Normal rate and regular rhythm.     Heart sounds: Murmur heard.  Pulmonary:     Effort: Pulmonary effort is normal.     Breath sounds: Normal breath sounds.  Abdominal:     General: Bowel sounds are normal.     Palpations: Abdomen is soft.  Musculoskeletal:     Cervical back: Normal range of motion.  Lymphadenopathy:     Cervical: No cervical adenopathy.  Skin:    General: Skin is warm and dry.     Capillary Refill: Capillary refill takes less than 2 seconds.  Neurological:     Mental Status: She is alert and oriented to person, place, and time.  Psychiatric:        Behavior: Behavior normal.      Musculoskeletal Exam: C-spine, thoracic spine, lumbar spine have good range of motion.  Shoulder joints,  elbow joints, wrist joints, MCPs, PIPs, DIPs have good range of motion with no synovitis.  Complete fist formation bilaterally.  Hip joints have good range of motion with no groin pain.  Knee joints have good range of motion no warmth or effusion.  Ankle joints have good range of motion with no tenderness or joint swelling.  No tenderness or synovitis over MTP joints.  Possible Morton's neuroma between the right third and fourth digit.   CDAI Exam: CDAI Score: -- Patient Global: --; Provider Global: -- Swollen: --; Tender: -- Joint Exam 08/02/2023   No joint exam has been documented for this visit   There is currently no information documented on the homunculus. Go to the Rheumatology activity and complete the homunculus joint exam.  Investigation: No additional findings.  Imaging: No results found.  Recent Labs: Lab Results  Component Value Date   WBC 5.8 05/07/2023   HGB 13.5 05/07/2023   PLT 212 05/07/2023   NA 140 05/07/2023   K 5.1 05/07/2023   CL 103 05/07/2023   CO2 30 05/07/2023   GLUCOSE 86 05/07/2023   BUN 16 05/07/2023   CREATININE 0.82 05/07/2023   BILITOT 0.7 05/07/2023   ALKPHOS 66 03/31/2017   AST 17 05/07/2023   ALT 14 05/07/2023   PROT 6.6 05/07/2023   ALBUMIN 4.1 03/31/2017   CALCIUM 10.3 05/07/2023   GFRAA 105 02/24/2021   QFTBGOLDPLUS NEGATIVE 12/10/2022    Speciality Comments: PLQ Eye Exam: 12/16/2022 WNL @ [facility] Opthalmology [facility] [phone] . Follow up in 1 year. Humira 09/22 IN CARE EVERYWHERE    Procedures:  No procedures performed Allergies: Latex, Yellow dye, and Percocet [oxycodone-acetaminophen]    Assessment / Plan:     Visit Diagnoses: Rheumatoid arthritis involving multiple sites with positive rheumatoid factor (HCC) - +RF, +CCP: She has no joint tenderness or synovitis on examination today.  She has not had any signs or symptoms of a rheumatoid arthritis flare.  She has clinically  been doing well taking  Plaquenil 200 mg 1 tablet by mouth twice daily Monday through Friday and Humira 40 mg subcutaneous injections every 14 days.  She is tolerating Plaquenil and Humira without any side effects or injection site reactions.  She has not had any interruptions in therapy recently.  She has not been experiencing any morning stiffness, nocturnal pain, or difficulty with ADLs.  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  She will remain on Plaquenil and Humira as prescribed.  She was advised to notify us if she develops signs or symptoms of a flare.  She will follow-up in the office in 5 months or sooner if needed.  Plan: hydroxychloroquine (PLAQUENIL) 200 MG tablet  High risk medication use - Humira 40 mg subcutaneous injections every 14 days and Plaquenil 200 mg 1 tablet by mouth twice daily, Monday through Friday only.  CBC and CMP updated on 05/07/23. Orders for CBC and CMP released today.  Lab work will be due in February and every 3 months to monitor for drug toxicity. TB gold negative on 12/10/22.   Discussed the importance of holding humira if she develops signs or symptoms of an infection and to resume once the infection has completely cleared.   PLQ Eye Exam: 12/16/2022 WNL @ [facility]  Plan: CBC with Differential/Platelet, COMPLETE METABOLIC PANEL WITH GFR  Primary osteoarthritis of both hands: No inflammation noted.   Dupuytren's contracture of right hand: Unchanged.   Primary osteoarthritis of both knees: She has good range of motion of both knee joints on examination today.  No warmth or effusion noted.  Chondromalacia of left patella: No warmth or effusion noted.   DDD (degenerative disc disease), cervical: C-spine has good ROM with no discomfort.  No symptoms of radiculopathy.   Psoriasis: No active psoriasis at this time.   Osteopenia of multiple sites: DEXA updated on 11/20/21: The BMD measured at Femur Neck Right is  0.798 g/cm2 with a T-score of -1.7. DEXA due in March 2025.   Other medical conditions are listed as follows:   History of breast cancer  Heart murmur  History of asthma  History of anemia  History of shingles  Orders: Orders Placed This Encounter  Procedures   CBC with Differential/Platelet   COMPLETE METABOLIC PANEL WITH GFR   Meds ordered this encounter  Medications   hydroxychloroquine (PLAQUENIL) 200 MG tablet    Sig: Take 1 tablet by mouth twice daily Monday-Friday only    Dispense:  120 tablet    Refill:  0     Follow-Up Instructions: Return in about 5 months (around 12/31/2023) for Rheumatoid arthritis.   Gearldine Bienenstock, PA-C  Note - This record has been created using Dragon software.  Chart creation errors have been sought, but may not always  have been located. Such creation errors do not reflect on  the standard of medical care.

## 2023-08-02 ENCOUNTER — Ambulatory Visit: Payer: 59 | Attending: Physician Assistant | Admitting: Physician Assistant

## 2023-08-02 ENCOUNTER — Encounter: Payer: Self-pay | Admitting: Physician Assistant

## 2023-08-02 VITALS — BP 112/71 | HR 69 | Resp 16 | Ht 65.5 in | Wt 159.6 lb

## 2023-08-02 DIAGNOSIS — M19041 Primary osteoarthritis, right hand: Secondary | ICD-10-CM | POA: Diagnosis not present

## 2023-08-02 DIAGNOSIS — L409 Psoriasis, unspecified: Secondary | ICD-10-CM

## 2023-08-02 DIAGNOSIS — M2242 Chondromalacia patellae, left knee: Secondary | ICD-10-CM

## 2023-08-02 DIAGNOSIS — M720 Palmar fascial fibromatosis [Dupuytren]: Secondary | ICD-10-CM

## 2023-08-02 DIAGNOSIS — Z79899 Other long term (current) drug therapy: Secondary | ICD-10-CM

## 2023-08-02 DIAGNOSIS — M170 Bilateral primary osteoarthritis of knee: Secondary | ICD-10-CM

## 2023-08-02 DIAGNOSIS — M8589 Other specified disorders of bone density and structure, multiple sites: Secondary | ICD-10-CM

## 2023-08-02 DIAGNOSIS — M19042 Primary osteoarthritis, left hand: Secondary | ICD-10-CM

## 2023-08-02 DIAGNOSIS — Z8619 Personal history of other infectious and parasitic diseases: Secondary | ICD-10-CM

## 2023-08-02 DIAGNOSIS — R011 Cardiac murmur, unspecified: Secondary | ICD-10-CM

## 2023-08-02 DIAGNOSIS — Z8709 Personal history of other diseases of the respiratory system: Secondary | ICD-10-CM

## 2023-08-02 DIAGNOSIS — Z862 Personal history of diseases of the blood and blood-forming organs and certain disorders involving the immune mechanism: Secondary | ICD-10-CM

## 2023-08-02 DIAGNOSIS — Z853 Personal history of malignant neoplasm of breast: Secondary | ICD-10-CM

## 2023-08-02 DIAGNOSIS — M0579 Rheumatoid arthritis with rheumatoid factor of multiple sites without organ or systems involvement: Secondary | ICD-10-CM

## 2023-08-02 DIAGNOSIS — M5030 Other cervical disc degeneration, unspecified cervical region: Secondary | ICD-10-CM

## 2023-08-02 MED ORDER — HYDROXYCHLOROQUINE SULFATE 200 MG PO TABS
ORAL_TABLET | ORAL | 0 refills | Status: DC
Start: 2023-08-02 — End: 2023-10-18

## 2023-08-02 NOTE — Patient Instructions (Signed)

## 2023-08-03 LAB — COMPLETE METABOLIC PANEL WITH GFR
AG Ratio: 1.5 (calc) (ref 1.0–2.5)
ALT: 13 U/L (ref 6–29)
AST: 17 U/L (ref 10–35)
Albumin: 4.3 g/dL (ref 3.6–5.1)
Alkaline phosphatase (APISO): 63 U/L (ref 37–153)
BUN: 15 mg/dL (ref 7–25)
CO2: 30 mmol/L (ref 20–32)
Calcium: 10.4 mg/dL (ref 8.6–10.4)
Chloride: 101 mmol/L (ref 98–110)
Creat: 0.78 mg/dL (ref 0.50–1.03)
Globulin: 2.9 g/dL (ref 1.9–3.7)
Glucose, Bld: 99 mg/dL (ref 65–99)
Potassium: 4.6 mmol/L (ref 3.5–5.3)
Sodium: 139 mmol/L (ref 135–146)
Total Bilirubin: 0.6 mg/dL (ref 0.2–1.2)
Total Protein: 7.2 g/dL (ref 6.1–8.1)
eGFR: 87 mL/min/{1.73_m2} (ref >=60)

## 2023-08-03 LAB — CBC WITH DIFFERENTIAL/PLATELET
Absolute Lymphocytes: 1679 {cells}/uL (ref 850–3900)
Absolute Monocytes: 493 {cells}/uL (ref 200–950)
Basophils Absolute: 39 {cells}/uL (ref 0–200)
Basophils Relative: 0.5 %
Eosinophils Absolute: 108 {cells}/uL (ref 15–500)
Eosinophils Relative: 1.4 %
HCT: 42.1 % (ref 35.0–45.0)
Hemoglobin: 13.8 g/dL (ref 11.7–15.5)
MCH: 30.1 pg (ref 27.0–33.0)
MCHC: 32.8 g/dL (ref 32.0–36.0)
MCV: 91.9 fL (ref 80.0–100.0)
MPV: 11.6 fL (ref 7.5–12.5)
Monocytes Relative: 6.4 %
Neutro Abs: 5382 {cells}/uL (ref 1500–7800)
Neutrophils Relative %: 69.9 %
Platelets: 232 10*3/uL (ref 140–400)
RBC: 4.58 10*6/uL (ref 3.80–5.10)
RDW: 12.8 % (ref 11.0–15.0)
Total Lymphocyte: 21.8 %
WBC: 7.7 10*3/uL (ref 3.8–10.8)

## 2023-08-03 NOTE — Progress Notes (Signed)
CBC and CMP WNL

## 2023-08-25 ENCOUNTER — Other Ambulatory Visit: Payer: Self-pay | Admitting: Physician Assistant

## 2023-08-25 NOTE — Telephone Encounter (Signed)
Last Fill: 05/14/2023  Labs: 08/02/2023 CBC and CMP WNL   TB Gold: 12/10/2022 Neg    Next Visit: 12/29/2023  Last Visit: 08/02/2023  [id number] arthritis involving multiple sites with positive rheumatoid factor   Current Dose per office note 08/02/2023: Humira 40 mg subcutaneous injections every 14 days   Okay to refill Humira?

## 2023-09-06 ENCOUNTER — Telehealth: Payer: Self-pay | Admitting: Rheumatology

## 2023-09-06 ENCOUNTER — Other Ambulatory Visit: Payer: Self-pay | Admitting: *Deleted

## 2023-09-06 MED ORDER — PREDNISONE 5 MG PO TABS: ORAL_TABLET | ORAL | 0 refills | Status: DC

## 2023-09-06 NOTE — Telephone Encounter (Signed)
Ok to send in a prednisone tapering starting at 20 mg tapering by 5 mg every 2 days.  Take prednisone in the morning with food and avoid the use of NSAIDs.

## 2023-09-06 NOTE — Progress Notes (Unsigned)
Patient wants to know if she should hold Humira?

## 2023-09-06 NOTE — Telephone Encounter (Signed)
Pt called stating her right wrist is swollen due to RA flair. Pt would like to know if Dr. Corliss Skains could prescribe her medication for this or if she would have to come in. Pts call back number is [phone].

## 2023-10-16 ENCOUNTER — Other Ambulatory Visit: Payer: Self-pay | Admitting: Physician Assistant

## 2023-10-16 DIAGNOSIS — M0579 Rheumatoid arthritis with rheumatoid factor of multiple sites without organ or systems involvement: Secondary | ICD-10-CM

## 2023-10-18 NOTE — Telephone Encounter (Signed)
Last Fill: 08/02/2023  Eye exam: 12/16/2022 WNL   Labs: 08/02/2023 CBC and CMP WNL   Next Visit: 12/29/2023  Last Visit: 08/02/2023  DX: Rheumatoid arthritis involving multiple sites with positive rheumatoid factor   Current Dose per office note 08/02/2023: Plaquenil 200 mg 1 tablet by mouth twice daily, Monday through Friday only.   Okay to refill Plaquenil?

## 2023-11-17 ENCOUNTER — Other Ambulatory Visit: Payer: Self-pay | Admitting: Rheumatology

## 2023-11-17 DIAGNOSIS — Z13220 Encounter for screening for lipoid disorders: Secondary | ICD-10-CM

## 2023-11-17 DIAGNOSIS — Z131 Encounter for screening for diabetes mellitus: Secondary | ICD-10-CM

## 2023-11-17 DIAGNOSIS — Z9225 Personal history of immunosupression therapy: Secondary | ICD-10-CM

## 2023-11-17 DIAGNOSIS — Z111 Encounter for screening for respiratory tuberculosis: Secondary | ICD-10-CM

## 2023-11-17 NOTE — Telephone Encounter (Signed)
 Last Fill: 08/25/2023  Labs: 08/02/2023 CBC and CMP WNL   TB Gold: 12/10/2022 Neg     Next Visit: 12/29/2023  Last Visit: 08/01/2024  RS:[id number] arthritis involving multiple sites with positive rheumatoid factor   Current Dose per office note 08/02/2023: Humira 40 mg subcutaneous injections every 14 days   Left message to advise patient she is due to update her lab work.   Okay to refill Humira?

## 2023-11-18 ENCOUNTER — Other Ambulatory Visit: Payer: Self-pay | Admitting: *Deleted

## 2023-11-18 DIAGNOSIS — Z9225 Personal history of immunosupression therapy: Secondary | ICD-10-CM

## 2023-11-18 DIAGNOSIS — Z131 Encounter for screening for diabetes mellitus: Secondary | ICD-10-CM

## 2023-11-18 DIAGNOSIS — Z111 Encounter for screening for respiratory tuberculosis: Secondary | ICD-10-CM

## 2023-11-18 DIAGNOSIS — Z13220 Encounter for screening for lipoid disorders: Secondary | ICD-10-CM

## 2023-11-18 DIAGNOSIS — Z79899 Other long term (current) drug therapy: Secondary | ICD-10-CM

## 2023-11-19 NOTE — Progress Notes (Signed)
 CBC and CMP WNL

## 2023-11-19 NOTE — Progress Notes (Signed)
 LDL is elevated.  Hemoglobin A1c is elevated at 5.7.  Please notify patient and forward results to his PCP.

## 2023-11-21 LAB — QUANTIFERON-TB GOLD PLUS
Mitogen-NIL: >10 [IU]/mL
NIL: 0.03 [IU]/mL
QuantiFERON-TB Gold Plus: NEGATIVE
TB1-NIL: 0.01 [IU]/mL
TB2-NIL: 0 [IU]/mL

## 2023-11-21 LAB — HEMOGLOBIN A1C
Hgb A1c MFr Bld: 5.7 %{Hb} — ABNORMAL HIGH (ref <5.7)
Mean Plasma Glucose: 117 mg/dL
eAG (mmol/L): 6.5 mmol/L

## 2023-11-21 LAB — CBC WITH DIFFERENTIAL/PLATELET
Absolute Lymphocytes: 1346 {cells}/uL (ref 850–3900)
Absolute Monocytes: 469 {cells}/uL (ref 200–950)
Basophils Absolute: 48 {cells}/uL (ref 0–200)
Basophils Relative: 0.7 %
Eosinophils Absolute: 252 {cells}/uL (ref 15–500)
Eosinophils Relative: 3.7 %
HCT: 41.1 % (ref 35.0–45.0)
Hemoglobin: 13.2 g/dL (ref 11.7–15.5)
MCH: 29.5 pg (ref 27.0–33.0)
MCHC: 32.1 g/dL (ref 32.0–36.0)
MCV: 91.9 fL (ref 80.0–100.0)
MPV: 11.3 fL (ref 7.5–12.5)
Monocytes Relative: 6.9 %
Neutro Abs: 4685 {cells}/uL (ref 1500–7800)
Neutrophils Relative %: 68.9 %
Platelets: 219 10*3/uL (ref 140–400)
RBC: 4.47 10*6/uL (ref 3.80–5.10)
RDW: 13 % (ref 11.0–15.0)
Total Lymphocyte: 19.8 %
WBC: 6.8 10*3/uL (ref 3.8–10.8)

## 2023-11-21 LAB — COMPLETE METABOLIC PANEL WITH GFR
AG Ratio: 1.6 (calc) (ref 1.0–2.5)
ALT: 14 U/L (ref 6–29)
AST: 18 U/L (ref 10–35)
Albumin: 4.1 g/dL (ref 3.6–5.1)
Alkaline phosphatase (APISO): 59 U/L (ref 37–153)
BUN: 16 mg/dL (ref 7–25)
CO2: 31 mmol/L (ref 20–32)
Calcium: 10.2 mg/dL (ref 8.6–10.4)
Chloride: 102 mmol/L (ref 98–110)
Creat: 0.99 mg/dL (ref 0.50–1.03)
Globulin: 2.5 g/dL (ref 1.9–3.7)
Glucose, Bld: 92 mg/dL (ref 65–99)
Potassium: 5 mmol/L (ref 3.5–5.3)
Sodium: 139 mmol/L (ref 135–146)
Total Bilirubin: 0.7 mg/dL (ref 0.2–1.2)
Total Protein: 6.6 g/dL (ref 6.1–8.1)
eGFR: 66 mL/min/{1.73_m2} (ref >=60)

## 2023-11-21 LAB — LIPID PANEL
Cholesterol: 228 mg/dL — ABNORMAL HIGH (ref <200)
HDL: 61 mg/dL (ref >=50)
LDL Cholesterol (Calc): 147 mg/dL — ABNORMAL HIGH
Non-HDL Cholesterol (Calc): 167 mg/dL — ABNORMAL HIGH (ref <130)
Total CHOL/HDL Ratio: 3.7 (calc) (ref <5.0)
Triglycerides: 91 mg/dL (ref <150)

## 2023-11-22 ENCOUNTER — Encounter: Payer: Self-pay | Admitting: Obstetrics and Gynecology

## 2023-11-22 NOTE — Progress Notes (Signed)
 TB Gold is negative.

## 2023-11-27 ENCOUNTER — Other Ambulatory Visit: Payer: Self-pay | Admitting: Nurse Practitioner

## 2023-12-01 ENCOUNTER — Other Ambulatory Visit: Payer: Self-pay | Admitting: Nurse Practitioner

## 2023-12-08 ENCOUNTER — Other Ambulatory Visit: Payer: Self-pay | Admitting: Rheumatology

## 2023-12-08 NOTE — Telephone Encounter (Signed)
 Last Fill: 11/17/2023  Labs: 11/18/2023  CBC and CMP WNL   TB Gold: 11/18/2023  TB Gold is negative.   Next Visit: 12/29/2023  Last Visit: 08/02/2023  [id number] arthritis involving multiple sites with positive rheumatoid factor   Current Dose per office note 08/02/2023: Humira 40 mg subcutaneous injections every 14 days   Okay to refill Humira?

## 2023-12-15 NOTE — Progress Notes (Signed)
 Office Visit Note  Patient: Rebecca Fleming             Date of Birth: 11-24-1965           MRN: [id number]             PCP: Patton Salles, MD Referring: Janean Sark* Visit Date: 12/29/2023 Occupation: @GUAROCC @  Subjective:  Medication monitoring  History of Present Illness: [name] is a 58 y.o. female with history of seropositive rheumatoid arthritis.  Patient remains on  Humira 40 mg subcutaneous injections every 14 days and Plaquenil 200 mg 1 tablet by mouth twice daily, Monday through Friday only.  She is tolerating combination therapy without any side effects and has not had any recent gaps in therapy.  Patient denies any signs or symptoms of a rheumatoid arthritis flare.  She is not experiencing any joint swelling at this time.  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  She denies any new medical conditions.  She has not had any recurrent infections.    Activities of Daily Living:  Patient reports morning stiffness for a few minutes.   Patient Denies nocturnal pain.  Difficulty dressing/grooming: Denies Difficulty climbing stairs: Denies Difficulty getting out of chair: Denies Difficulty using hands for taps, buttons, cutlery, and/or writing: Reports  Review of Systems  Constitutional:  Positive for fatigue.  HENT:  Positive for mouth dryness. Negative for mouth sores and nose dryness.   Eyes:  Positive for dryness. Negative for pain.  Respiratory:  Negative for shortness of breath and difficulty breathing.   Cardiovascular:  Negative for chest pain and palpitations.  Gastrointestinal:  Negative for blood in stool, constipation and diarrhea.  Endocrine: Negative for increased urination.  Genitourinary:  Negative for involuntary urination.  Musculoskeletal:  Positive for myalgias, morning stiffness, muscle tenderness and myalgias. Negative for joint pain, gait problem, joint pain, joint swelling and muscle  weakness.  Skin:  Negative for color change, rash, hair loss and sensitivity to sunlight.  Allergic/Immunologic: Negative for susceptible to infections.  Neurological:  Positive for headaches. Negative for dizziness.  Hematological:  Positive for swollen glands.  Psychiatric/Behavioral:  Positive for sleep disturbance. Negative for depressed mood. The patient is not nervous/anxious.     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   Blood transfusion without reported diagnosis 1976   1976  following surgery at [facility]    Cancer [facility]) 08/2008   left breast--has bilateral mastectomy   Complication of anesthesia    pt states" gets shakes" with general   Cyst near tailbone    Elevated hemoglobin A1c    Elevated LDL cholesterol level    Environmental and  seasonal allergies    Fibroid    Headache    sleeps , no meds   Heart murmur    never has caused any problems   Helicobacter pylori infection    Hemorrhoids    hx of   History of breast cancer    History of shingles    03/07/18   Hypotension    hx of   Post-operative nausea and vomiting    RA (rheumatoid arthritis) (HCC)    RA (rheumatoid arthritis) (HCC)    Scoliosis    tx  with robaxin prn - rarely uses   SVD (spontaneous vaginal delivery) 1996   x 1    Family History  Problem Relation Age of Onset   Breast cancer Mother    Diabetes Mother    Hypertension Mother    Stroke Mother    Irritable bowel syndrome Mother    Kidney disease Mother    Heart failure Father    Hypertension Father    Colon cancer Neg Hx    Colon polyps Neg Hx    Esophageal cancer Neg Hx    Pancreatic cancer Neg Hx    Rectal cancer Neg Hx    Stomach cancer Neg Hx    Past Surgical History:  Procedure Laterality Date   ABDOMINAL HYSTERECTOMY     APPENDECTOMY     BREAST LUMPECTOMY  05/17/2008   x 2 re excise for margin - Dr Jamey Ripa, bilateral mastectomy 08/2008   BREAST LUMPECTOMY W/ NEEDLE LOCALIZATION  04/23/2008   w [facility] Dr Jamey Ripa   CESAREAN SECTION  01/01/2004   Dr Edward Jolly   COLONOSCOPY  03/2018   COLONOSCOPY  05/05/2019   ENDOMETRIAL BIOPSY  06/2013, 2015   x 2   EYE SURGERY  2003   bilateral lasik   FOOT SURGERY     HYSTEROSCOPY WITH D & C  07/21/2011   Procedure: DILATATION AND CURETTAGE (D&C) /HYSTEROSCOPY;  Surgeon: Melony Overly;  Location: [facility];  Service: Gynecology;  Laterality: N/A;  with Ultrasound Guidance   INSERTION OF TISSUE EXPANDER AFTER MASTECTOMY  09/11/2008   Dr Odis Luster   KNEE ARTHROSCOPY W/ MENISCAL REPAIR Right 2010   --Dr. Priscille Kluver   LAPAROSCOPIC ASSISTED VAGINAL HYSTERECTOMY N/A 01/15/2015   Procedure: LAPAROSCOPIC ASSISTED VAGINAL HYSTERECTOMY;  Surgeon: Patton Salles, MD;  Location: [facility];  Service: Gynecology;  Laterality: N/A;   left foot surgery  2004   left rotator cuff repair  01/2006   MASTECTOMY  09/11/2008   bilateral - Dr Jamey Ripa   [facility] REMOVAL  09/09/2009   Dr Jamey Ripa   [facility] PLACEMENT  05/17/2008   Dr Salvadore Dom rounds of chemo   REMOVAL OF TISSUE EXPANDER AND PLACEMENT OF IMPLANT  12/2008   RHINOPLASTY     SALPINGOOPHORECTOMY Bilateral 01/15/2015   Procedure: BILATERAL SALPINGO OOPHORECTOMY;  Surgeon: Patton Salles, MD;  Location: [facility];  Service: Gynecology;  Laterality: Bilateral;   UMBILICAL HERNIA REPAIR  09/09/2009   Dr Jamey Ripa   UPPER GI ENDOSCOPY  03/2018   WISDOM TOOTH EXTRACTION     Social History   Social History Narrative   Right Handed    Lives in a two story home. Main living on first floor.   Immunization History  Administered Date(s) Administered   Influenza,inj,Quad PF,6+ Mos 07/12/2019   PFIZER(Purple Top)SARS-COV-2 Vaccination 10/05/2019, 10/25/2019, 05/11/2020   Pneumococcal Polysaccharide-23 01/16/2015   Tdap 09/21/2010, 07/12/2019     Objective: Vital Signs: BP 116/72 (BP Location:  Right Arm, Patient Position: Sitting, Cuff Size: Normal)   Pulse (!) 54   Ht 5' 5.5" (1.664 m)   Wt 160 lb 6.4 oz (72.8 kg)   LMP 11/12/2014   BMI 26.29 kg/m    Physical Exam Vitals and nursing note reviewed.  Constitutional:      Appearance: She is well-developed.  HENT:     Head: Normocephalic and atraumatic.  Eyes:     Conjunctiva/sclera: Conjunctivae normal.  Cardiovascular:     Rate and Rhythm: Normal rate and regular rhythm.     Heart sounds: Murmur heard.  Pulmonary:     Effort: Pulmonary effort is normal.     Breath sounds: Normal breath sounds.  Abdominal:     General: Bowel sounds are normal.     Palpations: Abdomen is soft.  Musculoskeletal:     Cervical back: Normal range of motion.  Skin:    General: Skin is warm and dry.     Capillary Refill: Capillary refill takes less than 2 seconds.  Neurological:     Mental Status: She is alert and oriented to person, place, and time.  Psychiatric:        Behavior: Behavior normal.      Musculoskeletal Exam: C-spine has limited range of motion with lateral rotation.  Shoulder joints, elbow joints, wrist joints, MCPs, PIPs and DIPs have good range of motion with no synovitis.  Complete fist formation bilaterally.  DIP prominence especially in the fifth digit bilaterally.  Subluxation of the DIP of the 5th  digit noted.  Complete fist formation bilaterally.  Hip joints have good range of motion with no groin pain.  Knee joints have good range of motion no warmth or effusion.  Ankle joints have good range of motion with no tenderness or joint swelling.  No tenderness or synovitis over MTP joints.  PIP and DIP thickening consistent with osteoarthritic changes of both feet noted.  CDAI Exam: CDAI Score: -- Patient Global: --; Provider Global: -- Swollen: --; Tender: -- Joint Exam 12/29/2023   No joint exam has been documented for this visit   There is currently no information documented on the homunculus. Go to the Rheumatology activity and complete the homunculus joint exam.  Investigation: No additional findings.  Imaging: No results found.  Recent Labs: Lab Results  Component Value Date   WBC 6.8 11/18/2023   HGB 13.2 11/18/2023   PLT 219 11/18/2023   NA 139 11/18/2023   K 5.0 11/18/2023   CL 102 11/18/2023   CO2 31 11/18/2023   GLUCOSE 92 11/18/2023   BUN 16 11/18/2023   CREATININE 0.99 11/18/2023   BILITOT 0.7 11/18/2023   ALKPHOS 66 03/31/2017   AST 18 11/18/2023   ALT 14 11/18/2023   PROT 6.6 11/18/2023   ALBUMIN 4.1 03/31/2017   CALCIUM 10.2 11/18/2023   GFRAA 105 02/24/2021   QFTBGOLDPLUS NEGATIVE 11/18/2023    Speciality Comments: PLQ Eye Exam: 12/16/2022 WNL @ [facility] [phone] . Follow up in 1 year. Humira 09/22 IN CARE EVERYWHERE    Procedures:  No procedures performed Allergies: Latex, Yellow dye, and Percocet [oxycodone-acetaminophen]   Assessment / Plan:     Visit Diagnoses: Rheumatoid arthritis involving multiple sites with positive rheumatoid factor (HCC) - +RF, +CCP: She has no joint tenderness or synovitis on examination today.  She has not had any signs or symptoms of a rheumatoid arthritis flare.  Her morning stiffness has only been lasting for few minutes daily.  She has not had any nocturnal pain or  difficulty with ADLs.  She has clinically been doing well on Humira 40 mg subcutaneous injections every 14 days and Plaquenil 200 mg 1 tablet by mouth twice daily Monday through Friday.  She is tolerating combination therapy without any side effects and has not had any recent gaps in therapy.  No recurrent infections.  A refill of Plaquenil sent to the pharmacy today.  No medication changes will be made at this time.  She was advised to notify us if she develops signs or symptoms of a flare.  She will follow-up in the office in 5 months or sooner if needed.- Plan: hydroxychloroquine (PLAQUENIL) 200 MG tablet  High risk medication use - Humira 40 mg subcutaneous injections every 14 days and Plaquenil 200 mg 1 tablet by mouth twice daily, Monday through Friday only. CBC and CMP WNL on 11/18/23.  Her next lab work will be due in May and every 3 months.  Standing orders for CBC and CMP were placed today. TB gold negative on 11/18/23.   Lipid panel updated on 11/18/2023. PLQ Eye Exam: 12/20/23 WNL @ [facility]. No recurrent infections.  Discussed the importance of holding Humira if she develops signs or symptoms of an infection and to resume once the infection has completely cleared.  - Plan: CBC with Differential/Platelet, Comprehensive metabolic panel with GFR  Primary osteoarthritis of both hands: Mild DIP prominence-most severe involving the fifth digits with mild subluxation bilaterally.  No synovitis was noted on examination today.  She was able to make a complete fist bilaterally.  Dupuytren's contracture of right hand  Primary osteoarthritis of both knees: She has occasional aching in both knees but has not noticed any inflammation.  On examination today she has good range of motion of both knee joints with no warmth or effusion.  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   Chondromalacia of left patella: Good ROM of the  left knee noted.  DDD (degenerative disc disease), cervical: C-spine has limited ROM with lateral rotation.  No discomfort or symptoms of radiculopathy at this time.   Psoriasis: No active psoriasis at this time.   Osteopenia of multiple sites - DEXA updated on 11/20/21: The BMD measured at [facility] Neck Right is 0.798 g/cm2 with a T-score of -1.7. DEXA was due in March 2025.  Other medical conditions are listed as follows:  History of breast cancer  Heart murmur  History of asthma  History of anemia  History of shingles   Orders: Orders Placed This Encounter  Procedures   CBC with Differential/Platelet   Comprehensive metabolic panel with GFR   Meds ordered this encounter  Medications   hydroxychloroquine (PLAQUENIL) 200 MG tablet    Sig: Take 1 tablet by mouth twice daily Monday-Friday only    Dispense:  120 tablet    Refill:  0      Follow-Up Instructions: Return in about 5 months (around 05/30/2024) for Rheumatoid arthritis.   Gearldine Bienenstock, PA-C  Note - This record has been created using Dragon software.  Chart creation errors have been sought, but may not always  have been located. Such creation errors do not reflect on  the standard of medical care.

## 2023-12-24 ENCOUNTER — Other Ambulatory Visit: Payer: Self-pay | Admitting: Physician Assistant

## 2023-12-24 DIAGNOSIS — M0579 Rheumatoid arthritis with rheumatoid factor of multiple sites without organ or systems involvement: Secondary | ICD-10-CM

## 2023-12-29 ENCOUNTER — Ambulatory Visit: Payer: 59 | Attending: Physician Assistant | Admitting: Physician Assistant

## 2023-12-29 ENCOUNTER — Encounter: Payer: Self-pay | Admitting: Physician Assistant

## 2023-12-29 VITALS — BP 116/72 | HR 54 | Ht 65.5 in | Wt 160.4 lb

## 2023-12-29 DIAGNOSIS — Z8619 Personal history of other infectious and parasitic diseases: Secondary | ICD-10-CM

## 2023-12-29 DIAGNOSIS — R011 Cardiac murmur, unspecified: Secondary | ICD-10-CM

## 2023-12-29 DIAGNOSIS — M720 Palmar fascial fibromatosis [Dupuytren]: Secondary | ICD-10-CM

## 2023-12-29 DIAGNOSIS — M0579 Rheumatoid arthritis with rheumatoid factor of multiple sites without organ or systems involvement: Secondary | ICD-10-CM | POA: Diagnosis not present

## 2023-12-29 DIAGNOSIS — Z862 Personal history of diseases of the blood and blood-forming organs and certain disorders involving the immune mechanism: Secondary | ICD-10-CM

## 2023-12-29 DIAGNOSIS — Z8709 Personal history of other diseases of the respiratory system: Secondary | ICD-10-CM

## 2023-12-29 DIAGNOSIS — M19041 Primary osteoarthritis, right hand: Secondary | ICD-10-CM | POA: Diagnosis not present

## 2023-12-29 DIAGNOSIS — Z79899 Other long term (current) drug therapy: Secondary | ICD-10-CM

## 2023-12-29 DIAGNOSIS — M170 Bilateral primary osteoarthritis of knee: Secondary | ICD-10-CM

## 2023-12-29 DIAGNOSIS — M5030 Other cervical disc degeneration, unspecified cervical region: Secondary | ICD-10-CM

## 2023-12-29 DIAGNOSIS — M8589 Other specified disorders of bone density and structure, multiple sites: Secondary | ICD-10-CM

## 2023-12-29 DIAGNOSIS — M2242 Chondromalacia patellae, left knee: Secondary | ICD-10-CM

## 2023-12-29 DIAGNOSIS — M19042 Primary osteoarthritis, left hand: Secondary | ICD-10-CM

## 2023-12-29 DIAGNOSIS — Z853 Personal history of malignant neoplasm of breast: Secondary | ICD-10-CM

## 2023-12-29 DIAGNOSIS — L409 Psoriasis, unspecified: Secondary | ICD-10-CM

## 2023-12-29 MED ORDER — HYDROXYCHLOROQUINE SULFATE 200 MG PO TABS: ORAL_TABLET | ORAL | 0 refills | Status: DC

## 2023-12-29 NOTE — Patient Instructions (Signed)
 Standing Labs We placed an order today for your standing lab work.   Please have your standing labs drawn at end of May and every 3 months   Please have your labs drawn 2 weeks prior to your appointment so that the provider can discuss your lab results at your appointment, if possible.  Please note that you may see your imaging and lab results in MyChart before we have reviewed them. We will contact you once all results are reviewed. Please allow our office up to 72 hours to thoroughly review all of the results before contacting the office for clarification of your results.  WALK-IN LAB HOURS  Monday through Thursday from 8:00 am -12:30 pm and 1:00 pm-5:00 pm and Friday from 8:00 am-12:00 pm.  Patients with office visits requiring labs will be seen before walk-in labs.  You may encounter longer than normal wait times. Please allow additional time. Wait times may be shorter on  Monday and Thursday afternoons.  We do not book appointments for walk-in labs. We appreciate your patience and understanding with our staff.   Labs are drawn by [facility]. Please bring your co-pay at the time of your lab draw.  You may receive a bill from [facility] for your lab work.  Please note if you are on Hydroxychloroquine and and an order has been placed for a Hydroxychloroquine level,  you will need to have it drawn 4 hours or more after your last dose.  If you wish to have your labs drawn at another location, please call the office 24 hours in advance so we can fax the orders.  The office is located at [address], [place], [place] [address]   If you have any questions regarding directions or hours of operation,  please call [phone].   As a reminder, please drink plenty of water prior to coming for your lab work. Thanks!

## 2023-12-30 ENCOUNTER — Ambulatory Visit: Payer: 59 | Admitting: Physician Assistant

## 2024-01-12 NOTE — Progress Notes (Unsigned)
 01/12/2024 Rebecca Fleming [id number] 26-Mar-1966   Chief Complaint: Medication refill   History of Present Illness: Rebecca Fleming is a 58 year old female with a past medical history of arthritis, smoker, breast cancer status post bilateral mastectomy 2009, rheumatoid arthritis on Humira  06/2021, hiatal hernia, GERD, esophageal stricture, H. Pylori gastritis 2019 and anemia.   She underwent an EGD by Dr. Sandrea Cruel 09/30/2017 which showed H. pylori gastritis which was treated with Pylera and PPI for 2 weeks.  Posttreatment H. pylori stool antigen test was not done as she remained on PPI therapy. She underwent a colonoscopy at the same time and a 28 mm tubulovillous adenomatous polyp was removed by piecemeal from the sigmoid colon.  She underwent a small bowel capsule endoscopy 07/15/2018 which showed very minimal fissuring to the proximal duodenum otherwise was negative.  A repeat colonoscopy was done 05/05/2019 and the tattoo from the prior polypectomy site was intact without evidence of residual polyp.  A 6 mm polyp was removed from the sigmoid colon and six  2-3 polyps were removed from the sigmoid colon.  Pathology report was consistent with hyperplastic polyps and benign colonic mucosa.  A repeat colonoscopy in 3 years was recommended.   Her son has a history of colon polyps.     Latest Ref Rng & Units 11/18/2023   11:49 AM 08/02/2023    2:27 PM 05/07/2023   11:28 AM  CBC  WBC 3.8 - 10.8 Thousand/uL 6.8  7.7  5.8   Hemoglobin 11.7 - 15.5 g/dL 57.8  46.9  62.9   Hematocrit 35.0 - 45.0 % 41.1  42.1  40.4   Platelets 140 - 400 Thousand/uL 219  232  212        Latest Ref Rng & Units 11/18/2023   11:49 AM 08/02/2023    2:27 PM 05/07/2023   11:28 AM  CMP  Glucose 65 - 99 mg/dL 92  99  86   BUN 7 - 25 mg/dL 16  15  16    Creatinine 0.50 - 1.03 mg/dL 5.28  4.13  2.44   Sodium 135 - 146 mmol/L 139  139  140   Potassium 3.5 - 5.3 mmol/L 5.0  4.6  5.1   Chloride 98 - 110 mmol/L 102  101  103    CO2 20 - 32 mmol/L 31  30  30    Calcium 8.6 - 10.4 mg/dL 01.0  27.2  53.6   Total Protein 6.1 - 8.1 g/dL 6.6  7.2  6.6   Total Bilirubin 0.2 - 1.2 mg/dL 0.7  0.6  0.7   AST 10 - 35 U/L 18  17  17    ALT 6 - 29 U/L 14  13  14      PAST GI PROCEDURES:  Colonoscopy 04/29/2022: - A tattoo was seen in the sigmoid colon. There was no evidence of residual polyp tissue.  - Internal hemorrhoids.  - The examination was otherwise normal on direct and retroflexion views.  - No specimens collected. - Recall colonoscopy 5 years   Colonoscopy 05/05/2019: - A tattoo was seen in the sigmoid colon. A post-polypectomy scar was found at the tattoo site. There was no evidence of residual polyp tissue. - One 6 mm polyp in the sigmoid colon, removed with a cold snare. Resected and retrieved. - Six 2 to 3 mm polyps in the sigmoid colon, removed with a cold biopsy forceps. Resected and retrieved. - Internal hemorrhoids. - The examination was otherwise normal on  direct and retroflexion views. - 3 year recall colonoscopy HYPERPLASTIC POLYP (2 FRAGMENTS) - MULTIPLE FRAGMENTS OF BENIGN COLONIC MUCOSA - NO HIGH GRADE DYSPLASIA OR MALIGNANCY IDENTIFIED   Small bowel capsule endoscopy 07/15/2018: Complete study, fairly good prep Very minimal fissuring proximal duodenum Otherwise negative study   EGD 03/30/2018; - LA Grade A reflux esophagitis. - Benign-appearing esophageal stenosis. Dilated. - Gastritis. Biopsied. - Medium-sized hiatal hernia. - Normal duodenal bulb and second portion of the duodenum.   Colonoscopy 03/30/2018: One 28 mm polyp in the sigmoid colon, removed piecemeal using a hot snare. Resected and retrieved. Tattooed. - The examination was otherwise normal on direct and retroflexion views.   Biopsy report: 1. Surgical [P], sigmoid (removed piecemeal), polyp - TUBULOVILLOUS ADENOMA (TWO FRAGMENTS). - NO HIGH GRADE DYSPLASIA OR MALIGNANCY. 2. Surgical [P], gastric body (gastritis) -  CHRONIC MILDLY ACTIVE HELICOBACTER PYLORI GASTRITIS. - NO INTESTINAL METAPLASIA, DYSPLASIA OR MALIGNANCY       Current Medications, Allergies, Past Medical History, Past Surgical History, Family History and Social History were reviewed in [employer] record.   Review of Systems:   Constitutional: Negative for fever, sweats, chills or weight loss.  Respiratory: Negative for shortness of breath.   Cardiovascular: Negative for chest pain, palpitations and leg swelling.  Gastrointestinal: See HPI.  Musculoskeletal: Negative for back pain or muscle aches.  Neurological: Negative for dizziness, headaches or paresthesias.    Physical Exam: LMP 11/12/2014  General: in no acute distress. Head: Normocephalic and atraumatic. Eyes: No scleral icterus. Conjunctiva pink . Ears: Normal auditory acuity. Mouth: Dentition intact. No ulcers or lesions.  Lungs: Clear throughout to auscultation. Heart: Regular rate and rhythm, no murmur. Abdomen: Soft, nontender and nondistended. No masses or hepatomegaly. Normal bowel sounds x 4 quadrants.  Rectal: Deferred.  Musculoskeletal: Symmetrical with no gross deformities. Extremities: No edema. Neurological: Alert oriented x 4. No focal deficits.  Psychological: Alert and cooperative. Normal mood and affect  Assessment and Recommendations: ***

## 2024-01-13 ENCOUNTER — Ambulatory Visit: Admitting: Nurse Practitioner

## 2024-01-13 ENCOUNTER — Encounter: Payer: Self-pay | Admitting: Nurse Practitioner

## 2024-01-13 VITALS — BP 100/62 | Ht 65.5 in | Wt 160.0 lb

## 2024-01-13 DIAGNOSIS — K219 Gastro-esophageal reflux disease without esophagitis: Secondary | ICD-10-CM | POA: Diagnosis not present

## 2024-01-13 DIAGNOSIS — B9681 Helicobacter pylori [H. pylori] as the cause of diseases classified elsewhere: Secondary | ICD-10-CM

## 2024-01-13 DIAGNOSIS — Z8619 Personal history of other infectious and parasitic diseases: Secondary | ICD-10-CM | POA: Diagnosis not present

## 2024-01-13 DIAGNOSIS — Z860101 Personal history of adenomatous and serrated colon polyps: Secondary | ICD-10-CM | POA: Diagnosis not present

## 2024-01-13 MED ORDER — PANTOPRAZOLE SODIUM 40 MG PO TBEC: 40.0000 mg | DELAYED_RELEASE_TABLET | Freq: Every day | ORAL | 3 refills | Status: AC

## 2024-01-13 NOTE — Patient Instructions (Signed)
 Next colonoscopy due 04/2027  Due to recent changes in healthcare laws, you may see the results of your imaging and laboratory studies on MyChart before your provider has had a chance to review them.  We understand that in some cases there may be results that are confusing or concerning to you. Not all laboratory results come back in the same time frame and the provider may be waiting for multiple results in order to interpret others.  Please give us  48 hours in order for your provider to thoroughly review all the results before contacting the office for clarification of your results.   Thank you for trusting me with your gastrointestinal care!   Everett Hitt, CRNP

## 2024-01-13 NOTE — Progress Notes (Signed)
 Noted.

## 2024-01-13 NOTE — Addendum Note (Signed)
 Addended by: Adriel Kessen N on: 01/13/2024 03:44 PM   Modules accepted: Orders

## 2024-01-14 NOTE — Progress Notes (Signed)
 Patient was contacted with recommendations.

## 2024-01-21 ENCOUNTER — Telehealth: Payer: Self-pay

## 2024-01-21 NOTE — Telephone Encounter (Signed)
 Contacted patient and patient verbalized understanding of negative [employer] h pylori stool test.

## 2024-02-24 ENCOUNTER — Other Ambulatory Visit: Payer: Self-pay | Admitting: Physician Assistant

## 2024-02-24 DIAGNOSIS — M0579 Rheumatoid arthritis with rheumatoid factor of multiple sites without organ or systems involvement: Secondary | ICD-10-CM

## 2024-03-08 ENCOUNTER — Other Ambulatory Visit: Payer: Self-pay | Admitting: Physician Assistant

## 2024-03-08 NOTE — Telephone Encounter (Signed)
 Last Fill: 12/08/2023  Labs: 11/18/2023 CBC and CMP WNL   TB Gold: 11/18/2023 Neg    Next Visit: 05/31/2024  Last Visit: 12/29/2023  [id number] arthritis involving multiple sites with positive rheumatoid factor   Current Dose per office note 12/29/2023: Humira  40 mg subcutaneous injections every 14 days   Left message to advise patient she is due to update labs.   Okay to refill Humira ?

## 2024-03-10 ENCOUNTER — Other Ambulatory Visit: Payer: Self-pay | Admitting: *Deleted

## 2024-03-10 DIAGNOSIS — Z79899 Other long term (current) drug therapy: Secondary | ICD-10-CM

## 2024-03-11 LAB — CBC WITH DIFFERENTIAL/PLATELET
Absolute Lymphocytes: 1201 {cells}/uL (ref 850–3900)
Absolute Monocytes: 521 {cells}/uL (ref 200–950)
Basophils Absolute: 40 {cells}/uL (ref 0–200)
Basophils Relative: 0.5 %
Eosinophils Absolute: 237 {cells}/uL (ref 15–500)
Eosinophils Relative: 3 %
HCT: 40.1 % (ref 35.0–45.0)
Hemoglobin: 13.2 g/dL (ref 11.7–15.5)
MCH: 30.3 pg (ref 27.0–33.0)
MCHC: 32.9 g/dL (ref 32.0–36.0)
MCV: 92 fL (ref 80.0–100.0)
MPV: 11.2 fL (ref 7.5–12.5)
Monocytes Relative: 6.6 %
Neutro Abs: 5901 {cells}/uL (ref 1500–7800)
Neutrophils Relative %: 74.7 %
Platelets: 215 10*3/uL (ref 140–400)
RBC: 4.36 10*6/uL (ref 3.80–5.10)
RDW: 13.2 % (ref 11.0–15.0)
Total Lymphocyte: 15.2 %
WBC: 7.9 10*3/uL (ref 3.8–10.8)

## 2024-03-11 LAB — COMPREHENSIVE METABOLIC PANEL WITH GFR
AG Ratio: 1.7 (calc) (ref 1.0–2.5)
ALT: 14 U/L (ref 6–29)
AST: 17 U/L (ref 10–35)
Albumin: 4 g/dL (ref 3.6–5.1)
Alkaline phosphatase (APISO): 59 U/L (ref 37–153)
BUN: 16 mg/dL (ref 7–25)
CO2: 29 mmol/L (ref 20–32)
Calcium: 9.9 mg/dL (ref 8.6–10.4)
Chloride: 104 mmol/L (ref 98–110)
Creat: 0.94 mg/dL (ref 0.50–1.03)
Globulin: 2.3 g/dL (ref 1.9–3.7)
Glucose, Bld: 84 mg/dL (ref 65–99)
Potassium: 5.1 mmol/L (ref 3.5–5.3)
Sodium: 140 mmol/L (ref 135–146)
Total Bilirubin: 0.7 mg/dL (ref 0.2–1.2)
Total Protein: 6.3 g/dL (ref 6.1–8.1)
eGFR: 70 mL/min/{1.73_m2} (ref >=60)

## 2024-03-12 ENCOUNTER — Ambulatory Visit: Payer: Self-pay | Admitting: Physician Assistant

## 2024-03-12 NOTE — Progress Notes (Signed)
 CBC and CMP WNL

## 2024-03-28 ENCOUNTER — Encounter: Payer: Self-pay | Admitting: Nurse Practitioner

## 2024-04-12 ENCOUNTER — Other Ambulatory Visit: Payer: Self-pay | Admitting: Physician Assistant

## 2024-04-12 DIAGNOSIS — M0579 Rheumatoid arthritis with rheumatoid factor of multiple sites without organ or systems involvement: Secondary | ICD-10-CM

## 2024-04-13 NOTE — Telephone Encounter (Signed)
 Last Fill: 12/29/2023  Eye exam: 12/20/2023 WNL   Labs: 03/10/2024 CBC and CMP WNL   Next Visit: 05/31/2024  Last Visit: 12/29/2023   DX: Rheumatoid arthritis involving multiple sites with positive rheumatoid factor   Current Dose per office note 12/29/2023: Plaquenil  200 mg 1 tablet by mouth twice daily, Monday through Friday only.   Okay to refill Plaquenil ?

## 2024-04-18 ENCOUNTER — Other Ambulatory Visit: Payer: Self-pay | Admitting: Physician Assistant

## 2024-04-18 NOTE — Telephone Encounter (Signed)
 Last Fill: 03/08/2024  Labs: 03/10/2024 CBC and CMP WNL   TB Gold: 11/18/2023 TB Gold Negative    Next Visit: 05/31/2024  Last Visit: 12/29/2023  DX: Rheumatoid arthritis involving multiple sites with positive rheumatoid factor   Current Dose per office note 12/29/2023: Humira  40 mg subcutaneous injections every 14 days   Okay to refill Humira ?

## 2024-05-24 ENCOUNTER — Telehealth: Payer: Self-pay

## 2024-05-24 ENCOUNTER — Other Ambulatory Visit (HOSPITAL_COMMUNITY): Payer: Self-pay

## 2024-05-24 NOTE — Telephone Encounter (Signed)
 Received notification from [employer] regarding a prior authorization for HUMIRA . Authorization has been APPROVED from 05/24/24 to 05/24/25. Approval letter sent to scan center.  Unable to run test claim because pharmacy is not in network.  Authorization # [id number] Phone # [phone]

## 2024-05-24 NOTE — Progress Notes (Unsigned)
 Office Visit Note  Patient: Rebecca Fleming             Date of Birth: 11-19-1965           MRN: [id number]             PCP: [name], MD Referring: Cathlyn JAYSON Nikki Bobie* Visit Date: 06/07/2024 Occupation: @GUAROCC @  Subjective:  Medication monitoring  History of Present Illness: Rebecca Fleming is a 58 y.o. female with history of rheumatoid arthritis and osteoarthritis. Patient remains on Humira  40 mg subcutaneous injections every 14 days and Plaquenil  200 mg 1 tablet by mouth twice daily, Monday through Friday only.  She is tolerating combination therapy without any side effects and has not had any gaps in therapy.  She denies any signs or symptoms of a rheumatoid arthritis flare.  She denies any morning stiffness, nocturnal pain, or difficulty performing ADLs.  She has been exercising on a regular basis.  She denies any joint swelling at this time.  Patient reports that she administered her dose of Humira  today.  She denies any recent or recurrent infections.   Activities of Daily Living:  Patient reports morning stiffness for 0 minutes  Patient Denies nocturnal pain.  Difficulty dressing/grooming: Denies Difficulty climbing stairs: Denies Difficulty getting out of chair: Denies Difficulty using hands for taps, buttons, cutlery, and/or writing: Denies  Review of Systems  Constitutional:  Negative for fatigue.  HENT:  Negative for mouth sores, mouth dryness and nose dryness.   Eyes:  Positive for dryness. Negative for pain and visual disturbance.  Respiratory:  Negative for cough, hemoptysis, shortness of breath and difficulty breathing.   Cardiovascular:  Negative for chest pain, palpitations, hypertension and swelling in legs/feet.  Gastrointestinal:  Negative for blood in stool, constipation and diarrhea.  Endocrine: Negative for increased urination.  Genitourinary:  Negative for painful urination.  Musculoskeletal:  Negative for joint pain, joint pain, joint  swelling, myalgias, muscle weakness, morning stiffness, muscle tenderness and myalgias.  Skin:  Negative for color change, pallor, rash, hair loss, nodules/bumps, skin tightness, ulcers and sensitivity to sunlight.  Allergic/Immunologic: Negative for susceptible to infections.  Neurological:  Positive for headaches. Negative for dizziness, numbness and weakness.  Hematological:  Negative for swollen glands.  Psychiatric/Behavioral:  Negative for depressed mood and sleep disturbance. The patient is not nervous/anxious.     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   Blood transfusion without reported diagnosis 1976   1976  following surgery at [facility]    Cancer [facility]) 08/2008   left breast--has bilateral mastectomy   Complication of anesthesia    pt states gets shakes with general   Cyst near tailbone    Elevated  hemoglobin A1c    Elevated LDL cholesterol level    Environmental and seasonal allergies    Fibroid    Headache    sleeps , no meds   Heart murmur    never has caused any problems   Helicobacter pylori infection    Hemorrhoids    hx of   History of breast cancer    History of shingles    03/07/18   Hypotension    hx  of   Post-operative nausea and vomiting    RA (rheumatoid arthritis) (HCC)    RA (rheumatoid arthritis) (HCC)    Scoliosis    tx with robaxin  prn - rarely uses   SVD (spontaneous vaginal delivery) 1996   x 1    Family History  Problem Relation Age of Onset   Breast cancer Mother    Diabetes Mother    Hypertension Mother    Stroke Mother    Irritable bowel syndrome Mother    Kidney disease Mother    Heart failure Father    Hypertension Father    Colon cancer Neg Hx    Colon polyps Neg Hx    Esophageal cancer Neg Hx    Pancreatic cancer Neg Hx    Rectal cancer Neg Hx    Stomach cancer Neg Hx    Past Surgical History:  Procedure Laterality Date   ABDOMINAL HYSTERECTOMY     APPENDECTOMY     BREAST LUMPECTOMY  05/17/2008   x 2 re excise for margin - Dr Merrilyn, bilateral mastectomy 08/2008   BREAST LUMPECTOMY W/ NEEDLE LOCALIZATION  04/23/2008   w SL[facility] Dr Merrilyn   CESAREAN SECTION  01/01/2004   Dr Nikki   COLONOSCOPY  03/2018   COLONOSCOPY  05/05/2019   ENDOMETRIAL BIOPSY  06/2013, 2015   x 2   EYE SURGERY  2003   bilateral lasik   FOOT SURGERY     HYSTEROSCOPY WITH D & C  07/21/2011   Procedure: DILATATION AND CURETTAGE (D&C) /HYSTEROSCOPY;  Surgeon: Bobie DELENA Nikki;  Location: [facility];  Service: Gynecology;  Laterality: N/A;  with Ultrasound Guidance   INSERTION OF TISSUE EXPANDER AFTER MASTECTOMY  09/11/2008   Dr Leora   KNEE ARTHROSCOPY W/ MENISCAL REPAIR Right 2010   --Dr. Lucious   LAPAROSCOPIC ASSISTED VAGINAL HYSTERECTOMY N/A 01/15/2015   Procedure: LAPAROSCOPIC ASSISTED VAGINAL HYSTERECTOMY;  Surgeon: Bobie FORBES Cathlyn JAYSON Nikki, MD;  Location: [facility];  Service: Gynecology;  Laterality: N/A;   left foot surgery  2004   left rotator cuff repair  01/2006   MASTECTOMY  09/11/2008   bilateral - Dr Merrilyn   [facility] REMOVAL  09/09/2009   Dr Merrilyn   [facility] PLACEMENT  05/17/2008   Dr Wallis rounds of chemo   REMOVAL OF TISSUE EXPANDER AND PLACEMENT OF IMPLANT   12/2008   RHINOPLASTY     SALPINGOOPHORECTOMY Bilateral 01/15/2015   Procedure: BILATERAL SALPINGO OOPHORECTOMY;  Surgeon: Bobie FORBES Cathlyn JAYSON Nikki, MD;  Location: [facility];  Service: Gynecology;  Laterality: Bilateral;   UMBILICAL HERNIA REPAIR  09/09/2009   Dr Merrilyn   UPPER GI ENDOSCOPY  03/2018   WISDOM TOOTH EXTRACTION     Social History   Social History Narrative   Right Handed    Lives in a two story home. Main living on first floor.   Immunization History  Administered Date(s) Administered   Influenza,inj,Quad PF,6+ Mos 07/12/2019   PFIZER(Purple Top)SARS-COV-2 Vaccination 10/05/2019, 10/25/2019, 05/11/2020   Pneumococcal Polysaccharide-23 01/16/2015  Tdap 09/21/2010, 07/12/2019     Objective: Vital Signs: Ht 5' 5.5 (1.664 m)   Wt 161 lb 12.8 oz (73.4 kg)   LMP 11/12/2014   BMI 26.52 kg/m    Physical Exam Vitals and nursing note reviewed.  Constitutional:      Appearance: She is well-developed.  HENT:     Head: Normocephalic and atraumatic.  Eyes:     Conjunctiva/sclera: Conjunctivae normal.  Cardiovascular:     Rate and Rhythm: Normal rate and regular rhythm.     Heart sounds: Normal heart sounds.  Pulmonary:     Effort: Pulmonary effort is normal.     Breath sounds: Normal breath sounds.  Abdominal:     General: Bowel sounds are normal.     Palpations: Abdomen is soft.  Musculoskeletal:     Cervical back: Normal range of motion.  Lymphadenopathy:     Cervical: No cervical adenopathy.  Skin:    General: Skin is warm and dry.     Capillary Refill: Capillary refill takes less than 2 seconds.  Neurological:     Mental Status: She is alert and oriented to person, place, and time.  Psychiatric:        Behavior: Behavior normal.      Musculoskeletal Exam: C-spine, thoracic spine, lumbar spine have good range of motion.  No midline spinal tenderness.  No SI joint tenderness.  Shoulder joints, elbow joints, wrist joints, MCPs, PIPs, DIPs have good range of  motion with no synovitis.  Complete fist formation bilaterally.  Hip joints have good range of motion with no groin pain.  Knee joints have good range of motion no warmth or effusion.  Ankle joints have good range of motion no tenderness or joint swelling.  No evidence of Achilles tendinitis or plantar fasciitis.   CDAI Exam: CDAI Score: -- Patient Global: --; Provider Global: -- Swollen: --; Tender: -- Joint Exam 06/07/2024   No joint exam has been documented for this visit   There is currently no information documented on the homunculus. Go to the Rheumatology activity and complete the homunculus joint exam.  Investigation: No additional findings.  Imaging: No results found.  Recent Labs: Lab Results  Component Value Date   WBC 7.3 06/06/2024   HGB 13.1 06/06/2024   PLT 179 06/06/2024   NA 140 06/06/2024   K 4.9 06/06/2024   CL 102 06/06/2024   CO2 30 06/06/2024   GLUCOSE 85 06/06/2024   BUN 18 06/06/2024   CREATININE 0.87 06/06/2024   BILITOT 0.7 06/06/2024   ALKPHOS 66 03/31/2017   AST 16 06/06/2024   ALT 12 06/06/2024   PROT 6.5 06/06/2024   ALBUMIN 4.1 03/31/2017   CALCIUM 10.0 06/06/2024   GFRAA 105 02/24/2021   QFTBGOLDPLUS NEGATIVE 11/18/2023    Speciality Comments: PLQ Eye Exam: 12/20/2023 WNL @  [facility]. (In Care Everywhere). Follow up in 1 year.  Humira  09/22  Procedures:  No procedures performed Allergies: Latex, Yellow dye #6 (sunset yellow), and Percocet [oxycodone -acetaminophen ]    Assessment / Plan:     Visit Diagnoses: Rheumatoid arthritis involving multiple sites with positive rheumatoid factor (HCC) - +RF, +CCP: She has no synovitis on examination today.  She has not had any signs or symptoms of a rheumatoid arthritis flare.  She has clinically been doing well on Humira  40 mg subcutaneous injections once every 14 days and Plaquenil  200 mg 1 tablet by mouth twice daily Monday through Friday.  She is tolerating combination therapy without any side effects and has not had any gaps in therapy.  No morning stiffness, nocturnal pain, or difficulty performing ADLs.  Patient remains active performing exercise classes several days per week.  No medication changes will be made at this time.  She was advised to notify us  if she develops signs or symptoms of a flare.  She will follow-up in the office in 5 months or sooner if needed.  High risk medication use - Humira  40 mg subcutaneous injections every 14 days and Plaquenil  200 mg 1 tablet by mouth twice daily, Monday through Friday only. Primary osteoarthritis of both hands: Mild PIP and DIP thickening consistent with osteoarthritis of both hands-most prominent involving DIP of both 5th digits.  No synovitis noted.  CBC and CMP updated on 06/06/24.  Lab work was within normal limits.  Her next lab work will be due in December and every 3 months to monitor for the toxicity. Lipid panel updated on 11/18/23. TB gold negative on 11/18/23.  No recent or recurrent infections.  Discussed the importance of holding humira  if she develops signs or symptoms of an infection and to resume once the infection has completely cleared.    Dupuytren's contracture of right hand: Unchanged.   Primary osteoarthritis of both knees: No warmth or effusion noted.  She has occasional discomfort in both knees when going down steps.  She has not had any difficulty rising from a seated position and has been able to remain active.   Chondromalacia of left patella: No warmth or effusion noted.  DDD (degenerative disc disease), cervical: C-spine has good range of motion with no discomfort.  Psoriasis: No active psoriasis at this time.  Osteopenia of multiple sites - DEXA updated on 11/20/21: The BMD measured at Femur Neck Right is 0.798 g/cm2 with a T-score of -1.7. DEXA was due in March 2025.  Other medical conditions are listed as follows:  History of breast cancer  Heart  murmur  History of asthma  History of anemia  History of shingles  Orders: No orders of the defined types were placed in this encounter.  No orders of the defined types were placed in this encounter.   Follow-Up Instructions: Return in about 5 months (around 11/07/2024) for Rheumatoid arthritis.   Waddell CHRISTELLA Craze, PA-C  Note - This record has been created using Dragon software.  Chart creation errors have been sought, but may not always  have been located. Such creation errors do not reflect on  the standard of medical care.

## 2024-05-24 NOTE — Telephone Encounter (Signed)
 PA renewal initiated automatically by CoverMyMeds.  Submitted a Prior Authorization request to [employer] for HUMIRA  via CoverMyMeds. Will update once we receive a response.   Key: [id number]

## 2024-05-31 ENCOUNTER — Ambulatory Visit: Admitting: Physician Assistant

## 2024-06-06 ENCOUNTER — Other Ambulatory Visit: Payer: Self-pay | Admitting: *Deleted

## 2024-06-06 DIAGNOSIS — Z79899 Other long term (current) drug therapy: Secondary | ICD-10-CM

## 2024-06-06 LAB — CBC WITH DIFFERENTIAL/PLATELET
Absolute Lymphocytes: 1102 {cells}/uL (ref 850–3900)
Absolute Monocytes: 526 {cells}/uL (ref 200–950)
Basophils Absolute: 37 {cells}/uL (ref 0–200)
Basophils Relative: 0.5 %
Eosinophils Absolute: 161 {cells}/uL (ref 15–500)
Eosinophils Relative: 2.2 %
HCT: 39.9 % (ref 35.0–45.0)
Hemoglobin: 13.1 g/dL (ref 11.7–15.5)
MCH: 30.5 pg (ref 27.0–33.0)
MCHC: 32.8 g/dL (ref 32.0–36.0)
MCV: 92.8 fL (ref 80.0–100.0)
MPV: 11.4 fL (ref 7.5–12.5)
Monocytes Relative: 7.2 %
Neutro Abs: 5475 {cells}/uL (ref 1500–7800)
Neutrophils Relative %: 75 %
Platelets: 179 Thousand/uL (ref 140–400)
RBC: 4.3 Million/uL (ref 3.80–5.10)
RDW: 13.1 % (ref 11.0–15.0)
Total Lymphocyte: 15.1 %
WBC: 7.3 Thousand/uL (ref 3.8–10.8)

## 2024-06-06 LAB — COMPREHENSIVE METABOLIC PANEL WITH GFR
AG Ratio: 1.8 (calc) (ref 1.0–2.5)
ALT: 12 U/L (ref 6–29)
AST: 16 U/L (ref 10–35)
Albumin: 4.2 g/dL (ref 3.6–5.1)
Alkaline phosphatase (APISO): 60 U/L (ref 37–153)
BUN: 18 mg/dL (ref 7–25)
CO2: 30 mmol/L (ref 20–32)
Calcium: 10 mg/dL (ref 8.6–10.4)
Chloride: 102 mmol/L (ref 98–110)
Creat: 0.87 mg/dL (ref 0.50–1.03)
Globulin: 2.3 g/dL (ref 1.9–3.7)
Glucose, Bld: 85 mg/dL (ref 65–99)
Potassium: 4.9 mmol/L (ref 3.5–5.3)
Sodium: 140 mmol/L (ref 135–146)
Total Bilirubin: 0.7 mg/dL (ref 0.2–1.2)
Total Protein: 6.5 g/dL (ref 6.1–8.1)
eGFR: 77 mL/min/1.73m2 (ref >=60)

## 2024-06-07 ENCOUNTER — Ambulatory Visit: Payer: Self-pay | Admitting: Physician Assistant

## 2024-06-07 ENCOUNTER — Encounter: Payer: Self-pay | Admitting: Physician Assistant

## 2024-06-07 ENCOUNTER — Ambulatory Visit: Attending: Physician Assistant | Admitting: Physician Assistant

## 2024-06-07 VITALS — BP 105/65 | HR 67 | Temp 98.0°F | Resp 12 | Ht 65.5 in | Wt 161.8 lb

## 2024-06-07 DIAGNOSIS — Z853 Personal history of malignant neoplasm of breast: Secondary | ICD-10-CM

## 2024-06-07 DIAGNOSIS — M720 Palmar fascial fibromatosis [Dupuytren]: Secondary | ICD-10-CM | POA: Diagnosis not present

## 2024-06-07 DIAGNOSIS — M19042 Primary osteoarthritis, left hand: Secondary | ICD-10-CM

## 2024-06-07 DIAGNOSIS — Z79899 Other long term (current) drug therapy: Secondary | ICD-10-CM

## 2024-06-07 DIAGNOSIS — M19041 Primary osteoarthritis, right hand: Secondary | ICD-10-CM

## 2024-06-07 DIAGNOSIS — R011 Cardiac murmur, unspecified: Secondary | ICD-10-CM

## 2024-06-07 DIAGNOSIS — M0579 Rheumatoid arthritis with rheumatoid factor of multiple sites without organ or systems involvement: Secondary | ICD-10-CM

## 2024-06-07 DIAGNOSIS — M170 Bilateral primary osteoarthritis of knee: Secondary | ICD-10-CM

## 2024-06-07 DIAGNOSIS — M2242 Chondromalacia patellae, left knee: Secondary | ICD-10-CM

## 2024-06-07 DIAGNOSIS — Z862 Personal history of diseases of the blood and blood-forming organs and certain disorders involving the immune mechanism: Secondary | ICD-10-CM

## 2024-06-07 DIAGNOSIS — L409 Psoriasis, unspecified: Secondary | ICD-10-CM

## 2024-06-07 DIAGNOSIS — M5030 Other cervical disc degeneration, unspecified cervical region: Secondary | ICD-10-CM

## 2024-06-07 DIAGNOSIS — M8589 Other specified disorders of bone density and structure, multiple sites: Secondary | ICD-10-CM

## 2024-06-07 DIAGNOSIS — Z8619 Personal history of other infectious and parasitic diseases: Secondary | ICD-10-CM

## 2024-06-07 DIAGNOSIS — Z8709 Personal history of other diseases of the respiratory system: Secondary | ICD-10-CM

## 2024-06-07 NOTE — Progress Notes (Signed)
 CBC and CMP WNL

## 2024-06-07 NOTE — Patient Instructions (Signed)

## 2024-06-19 ENCOUNTER — Ambulatory Visit: Payer: 59 | Admitting: Obstetrics and Gynecology

## 2024-07-07 ENCOUNTER — Other Ambulatory Visit: Payer: Self-pay | Admitting: Physician Assistant

## 2024-07-07 DIAGNOSIS — M0579 Rheumatoid arthritis with rheumatoid factor of multiple sites without organ or systems involvement: Secondary | ICD-10-CM

## 2024-07-10 NOTE — Telephone Encounter (Signed)
 Last Fill: 04/13/2024  Eye exam: 12/20/2023 WNL   Labs: 06/06/2024 CBC and CMP WNL   Next Visit: 11/08/2024  Last Visit: 06/07/2024  IK:Myzlfjunpi arthritis involving multiple sites with positive rheumatoid factor (HCC)   Current Dose per office note 06/07/2024: Plaquenil  200 mg 1 tablet by mouth twice daily, Monday through Friday only.   Okay to refill Plaquenil ?

## 2024-07-17 ENCOUNTER — Other Ambulatory Visit: Payer: Self-pay | Admitting: Physician Assistant

## 2024-07-17 NOTE — Telephone Encounter (Signed)
 Last Fill: 04/18/2024  Labs: 06/06/2024  CBC and CMP WNL   TB Gold: 11/18/2023 negative    Next Visit: 11/08/2024  Last Visit: 06/07/2024  DX: Rheumatoid arthritis involving multiple sites with positive rheumatoid factor   Current Dose per office note on 06/07/2024: Humira  40 mg subcutaneous injections every 14 days   Okay to refill Humira ?

## 2024-09-28 ENCOUNTER — Other Ambulatory Visit: Payer: Self-pay | Admitting: Physician Assistant

## 2024-09-28 DIAGNOSIS — M0579 Rheumatoid arthritis with rheumatoid factor of multiple sites without organ or systems involvement: Secondary | ICD-10-CM

## 2024-09-28 NOTE — Telephone Encounter (Signed)
 Last Fill: 07/10/2024  Eye exam: 12/20/2023   Labs: 06/06/2024 CBC and CMP WNL   Next Visit: 11/08/2024  Last Visit: 06/07/2024  IK:Myzlfjunpi arthritis involving multiple sites with positive rheumatoid factor   Current Dose per office note on 06/07/2024: Plaquenil  200 mg 1 tablet by mouth twice daily, Monday through Friday only.   Okay to refill Plaquenil ?

## 2024-10-09 ENCOUNTER — Other Ambulatory Visit: Payer: Self-pay | Admitting: Physician Assistant

## 2024-10-09 DIAGNOSIS — Z111 Encounter for screening for respiratory tuberculosis: Secondary | ICD-10-CM

## 2024-10-09 DIAGNOSIS — Z9225 Personal history of immunosupression therapy: Secondary | ICD-10-CM

## 2024-10-09 NOTE — Telephone Encounter (Signed)
 Last Fill: 07/17/2024  Labs: 06/06/2024 CBC and CMP WNL   TB Gold: 11/18/2023 negative    Next Visit: 11/08/2024  Last Visit: 06/07/2024  IK:Myzlfjunpi arthritis involving multiple sites with positive rheumatoid factor   Current Dose per office note 06/07/2024: Humira  40 mg subcutaneous injections every 14 days   Patient advised she is due to update her labs. Patient states she will update them this week.   Okay to refill Humira ?

## 2024-10-13 ENCOUNTER — Other Ambulatory Visit: Payer: Self-pay | Admitting: *Deleted

## 2024-10-13 DIAGNOSIS — Z9225 Personal history of immunosupression therapy: Secondary | ICD-10-CM

## 2024-10-13 DIAGNOSIS — R011 Cardiac murmur, unspecified: Secondary | ICD-10-CM

## 2024-10-13 DIAGNOSIS — M0579 Rheumatoid arthritis with rheumatoid factor of multiple sites without organ or systems involvement: Secondary | ICD-10-CM

## 2024-10-13 DIAGNOSIS — Z853 Personal history of malignant neoplasm of breast: Secondary | ICD-10-CM

## 2024-10-13 DIAGNOSIS — Z79899 Other long term (current) drug therapy: Secondary | ICD-10-CM

## 2024-10-13 DIAGNOSIS — L409 Psoriasis, unspecified: Secondary | ICD-10-CM

## 2024-10-13 DIAGNOSIS — Z111 Encounter for screening for respiratory tuberculosis: Secondary | ICD-10-CM

## 2024-10-15 ENCOUNTER — Ambulatory Visit: Payer: Self-pay | Admitting: Rheumatology

## 2024-10-15 NOTE — Progress Notes (Signed)
 LDL is elevated.

## 2024-10-16 ENCOUNTER — Ambulatory Visit: Payer: Self-pay | Admitting: Physician Assistant

## 2024-10-16 NOTE — Progress Notes (Signed)
 CBC and CMP WNL

## 2024-10-17 LAB — LIPID PANEL
Cholesterol: 218 mg/dL — ABNORMAL HIGH
HDL: 66 mg/dL
LDL Cholesterol (Calc): 137 mg/dL — ABNORMAL HIGH
Non-HDL Cholesterol (Calc): 152 mg/dL — ABNORMAL HIGH
Total CHOL/HDL Ratio: 3.3 (calc)
Triglycerides: 63 mg/dL

## 2024-10-17 LAB — CBC WITH DIFFERENTIAL/PLATELET
Absolute Lymphocytes: 1138 {cells}/uL (ref 850–3900)
Absolute Monocytes: 598 {cells}/uL (ref 200–950)
Basophils Absolute: 43 {cells}/uL (ref 0–200)
Basophils Relative: 0.6 %
Eosinophils Absolute: 209 {cells}/uL (ref 15–500)
Eosinophils Relative: 2.9 %
HCT: 40.6 % (ref 35.9–46.0)
Hemoglobin: 13.2 g/dL (ref 11.7–15.5)
MCH: 29.9 pg (ref 27.0–33.0)
MCHC: 32.5 g/dL (ref 31.6–35.4)
MCV: 92.1 fL (ref 81.4–101.7)
MPV: 11.2 fL (ref 7.5–12.5)
Monocytes Relative: 8.3 %
Neutro Abs: 5213 {cells}/uL (ref 1500–7800)
Neutrophils Relative %: 72.4 %
Platelets: 205 10*3/uL (ref 140–400)
RBC: 4.41 Million/uL (ref 3.80–5.10)
RDW: 12.9 % (ref 11.0–15.0)
Total Lymphocyte: 15.8 %
WBC: 7.2 10*3/uL (ref 3.8–10.8)

## 2024-10-17 LAB — COMPREHENSIVE METABOLIC PANEL WITH GFR
AG Ratio: 2 (calc) (ref 1.0–2.5)
ALT: 15 U/L (ref 6–29)
AST: 19 U/L (ref 10–35)
Albumin: 4.3 g/dL (ref 3.6–5.1)
Alkaline phosphatase (APISO): 55 U/L (ref 37–153)
BUN: 18 mg/dL (ref 7–25)
CO2: 30 mmol/L (ref 20–32)
Calcium: 10.1 mg/dL (ref 8.6–10.4)
Chloride: 103 mmol/L (ref 98–110)
Creat: 0.87 mg/dL (ref 0.50–1.05)
Globulin: 2.2 g/dL (ref 1.9–3.7)
Glucose, Bld: 90 mg/dL (ref 65–99)
Potassium: 5.2 mmol/L (ref 3.5–5.3)
Sodium: 140 mmol/L (ref 135–146)
Total Bilirubin: 0.7 mg/dL (ref 0.2–1.2)
Total Protein: 6.5 g/dL (ref 6.1–8.1)
eGFR: 76 mL/min/{1.73_m2}

## 2024-10-17 LAB — QUANTIFERON-TB GOLD PLUS
Mitogen-NIL: 6.82 [IU]/mL
NIL: 0.02 [IU]/mL
QuantiFERON-TB Gold Plus: NEGATIVE
TB1-NIL: 0 [IU]/mL
TB2-NIL: 0 [IU]/mL

## 2024-10-18 NOTE — Progress Notes (Signed)
 TB Gold is negative.

## 2024-10-25 NOTE — Progress Notes (Unsigned)
 "  Office Visit Note  Patient: Rebecca Fleming             Date of Birth: 10/06/1965           MRN: [id number]             PCP: [name], MD Referring: [name], MD Visit Date: 11/08/2024 Occupation: Data Unavailable  Subjective:  No chief complaint on file.   History of Present Illness: Rebecca Fleming is a 59 y.o. female ***     Activities of Daily Living:  Patient reports morning stiffness for *** {minute/hour:19697}.   Patient {ACTIONS;DENIES/REPORTS:21021675::Denies} nocturnal pain.  Difficulty dressing/grooming: {ACTIONS;DENIES/REPORTS:21021675::Denies} Difficulty climbing stairs: {ACTIONS;DENIES/REPORTS:21021675::Denies} Difficulty getting out of chair: {ACTIONS;DENIES/REPORTS:21021675::Denies} Difficulty using hands for taps, buttons, cutlery, and/or writing: {ACTIONS;DENIES/REPORTS:21021675::Denies}  No Rheumatology ROS completed.   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   Blood transfusion without reported diagnosis 1976   1976  following surgery at [facility]    Cancer [facility]) 08/2008   left breast--has bilateral mastectomy   Complication of anesthesia    pt states gets  shakes with general   Cyst near tailbone    Elevated hemoglobin A1c    Elevated LDL cholesterol level    Environmental and seasonal allergies    Fibroid    Headache    sleeps , no meds   Heart murmur    never has caused any problems   Helicobacter pylori infection    Hemorrhoids    hx of   History of breast cancer    History of shingles    03/07/18   Hypotension    hx of   Post-operative nausea and vomiting    RA (rheumatoid arthritis) (HCC)    RA (rheumatoid arthritis) (HCC)    Scoliosis    tx with robaxin  prn - rarely uses   SVD (spontaneous vaginal delivery) 1996   x 1    Family History  Problem Relation Age of Onset   Breast cancer Mother    Diabetes Mother    Hypertension Mother    Stroke Mother    Irritable bowel syndrome Mother    Kidney disease Mother    Heart failure Father    Hypertension Father    Colon cancer Neg Hx    Colon polyps Neg Hx    Esophageal cancer Neg Hx    Pancreatic cancer Neg Hx    Rectal cancer Neg Hx    Stomach cancer Neg Hx    Past Surgical History:  Procedure Laterality Date   ABDOMINAL HYSTERECTOMY     APPENDECTOMY     BREAST LUMPECTOMY  05/17/2008   x 2 re excise for margin - Dr Merrilyn, bilateral  mastectomy 08/2008   BREAST LUMPECTOMY W/ NEEDLE LOCALIZATION  04/23/2008   w SL[facility] Dr Merrilyn   CESAREAN SECTION  01/01/2004   Dr Nikki   COLONOSCOPY  03/2018   COLONOSCOPY  05/05/2019   ENDOMETRIAL BIOPSY  06/2013, 2015   x 2   EYE SURGERY  2003   bilateral lasik   FOOT SURGERY     HYSTEROSCOPY WITH D & C  07/21/2011   Procedure: DILATATION AND CURETTAGE (D&C) /HYSTEROSCOPY;  Surgeon: Bobie DELENA Nikki;  Location: [facility];  Service: Gynecology;  Laterality: N/A;  with Ultrasound Guidance   INSERTION OF TISSUE EXPANDER AFTER MASTECTOMY  09/11/2008   Dr Leora   KNEE ARTHROSCOPY W/ MENISCAL REPAIR Right 2010   --Dr. Lucious   LAPAROSCOPIC ASSISTED VAGINAL HYSTERECTOMY N/A 01/15/2015   Procedure: LAPAROSCOPIC ASSISTED VAGINAL  HYSTERECTOMY;  Surgeon: Bobie FORBES Cathlyn JAYSON Nikki, MD;  Location: [facility];  Service: Gynecology;  Laterality: N/A;   left foot surgery  2004   left rotator cuff repair  01/2006   MASTECTOMY  09/11/2008   bilateral - Dr Merrilyn   [facility] REMOVAL  09/09/2009   Dr Merrilyn   [facility] PLACEMENT  05/17/2008   Dr Wallis rounds of chemo   REMOVAL OF TISSUE EXPANDER AND PLACEMENT OF IMPLANT  12/2008   RHINOPLASTY     SALPINGOOPHORECTOMY Bilateral 01/15/2015   Procedure: BILATERAL SALPINGO OOPHORECTOMY;  Surgeon: Bobie FORBES Cathlyn JAYSON Nikki, MD;  Location: [facility];  Service: Gynecology;  Laterality: Bilateral;   UMBILICAL HERNIA REPAIR  09/09/2009   Dr Merrilyn   UPPER GI ENDOSCOPY  03/2018   WISDOM TOOTH EXTRACTION     Social History[1] Social History   Social History Narrative   Right Handed    Lives in a two story home. Main living on first floor.     Immunization History  Administered Date(s) Administered   Influenza,inj,Quad PF,6+ Mos 07/12/2019   PFIZER(Purple Top)SARS-COV-2 Vaccination 10/05/2019, 10/25/2019, 05/11/2020   Pneumococcal Polysaccharide-23 01/16/2015   Tdap 09/21/2010, 07/12/2019     Objective: Vital Signs: LMP 11/12/2014    Physical Exam   Musculoskeletal Exam: ***  CDAI Exam: CDAI Score: -- Patient Global: --; Provider Global: -- Swollen: --; Tender: -- Joint Exam 11/08/2024   No joint exam has been documented for this visit   There is currently no information documented on the homunculus. Go to the Rheumatology activity and complete the homunculus joint exam.  Investigation: No additional findings.  Imaging: No results found.  Recent Labs: Lab Results  Component Value Date   WBC 7.2 10/13/2024   HGB 13.2 10/13/2024   PLT 205 10/13/2024   NA 140 10/13/2024   K 5.2 10/13/2024   CL 103 10/13/2024   CO2 30 10/13/2024   GLUCOSE 90 10/13/2024   BUN 18 10/13/2024   CREATININE 0.87 10/13/2024   BILITOT 0.7 10/13/2024   ALKPHOS 66 03/31/2017    AST 19 10/13/2024   ALT 15 10/13/2024   PROT 6.5 10/13/2024   ALBUMIN 4.1 03/31/2017   CALCIUM 10.1 10/13/2024   GFRAA 105 02/24/2021   QFTBGOLDPLUS NEGATIVE 10/13/2024    Speciality Comments: PLQ Eye Exam: 12/20/2023 WNL @  [facility]. (In Care Everywhere). Follow up in 1 year.  Humira  09/22  Procedures:  No procedures performed Allergies: Latex, Yellow dye #6 (sunset yellow), and Percocet [oxycodone -acetaminophen ]   Assessment / Plan:     Visit Diagnoses: Rheumatoid arthritis involving multiple sites with positive rheumatoid factor (HCC)  High  risk medication use  Psoriasis  History of breast cancer  Heart murmur  Primary osteoarthritis of both hands  Dupuytren's contracture of right hand  Primary osteoarthritis of both knees  Chondromalacia of left patella  DDD (degenerative disc disease), cervical  Osteopenia of multiple sites  History of asthma  History of anemia  History of shingles  Orders: No orders of the defined types were placed in this encounter.  No orders of the defined types were placed in this encounter.   Face-to-face time spent with patient was *** minutes. Greater than 50% of time was spent in counseling and coordination of care.  Follow-Up Instructions: No follow-ups on file.   Waddell CHRISTELLA Craze, PA-C  Note - This record has been created using Dragon software.  Chart creation errors have been sought, but may not always  have been located. Such creation errors do not reflect on  the standard of medical care.     [1]  Social History Tobacco Use   Smoking status: Never    Passive exposure: Past   Smokeless tobacco: Never  Vaping Use   Vaping status: Never Used  Substance Use Topics   Alcohol use: Yes    Alcohol/week: 2.0 standard drinks of alcohol    Types: 2 Cans of beer per week    Comment: occ glass of wine or beer   Drug use: No   "

## 2024-11-01 ENCOUNTER — Ambulatory Visit: Admitting: Obstetrics and Gynecology

## 2024-11-08 ENCOUNTER — Ambulatory Visit: Admitting: Physician Assistant

## 2024-11-08 DIAGNOSIS — M0579 Rheumatoid arthritis with rheumatoid factor of multiple sites without organ or systems involvement: Secondary | ICD-10-CM

## 2024-11-08 DIAGNOSIS — M170 Bilateral primary osteoarthritis of knee: Secondary | ICD-10-CM

## 2024-11-08 DIAGNOSIS — Z8709 Personal history of other diseases of the respiratory system: Secondary | ICD-10-CM

## 2024-11-08 DIAGNOSIS — M8589 Other specified disorders of bone density and structure, multiple sites: Secondary | ICD-10-CM

## 2024-11-08 DIAGNOSIS — M2242 Chondromalacia patellae, left knee: Secondary | ICD-10-CM

## 2024-11-08 DIAGNOSIS — M720 Palmar fascial fibromatosis [Dupuytren]: Secondary | ICD-10-CM

## 2024-11-08 DIAGNOSIS — M5030 Other cervical disc degeneration, unspecified cervical region: Secondary | ICD-10-CM

## 2024-11-08 DIAGNOSIS — Z853 Personal history of malignant neoplasm of breast: Secondary | ICD-10-CM

## 2024-11-08 DIAGNOSIS — Z8619 Personal history of other infectious and parasitic diseases: Secondary | ICD-10-CM

## 2024-11-08 DIAGNOSIS — Z862 Personal history of diseases of the blood and blood-forming organs and certain disorders involving the immune mechanism: Secondary | ICD-10-CM

## 2024-11-08 DIAGNOSIS — M19041 Primary osteoarthritis, right hand: Secondary | ICD-10-CM

## 2024-11-08 DIAGNOSIS — L409 Psoriasis, unspecified: Secondary | ICD-10-CM

## 2024-11-08 DIAGNOSIS — Z79899 Other long term (current) drug therapy: Secondary | ICD-10-CM

## 2024-11-08 DIAGNOSIS — R011 Cardiac murmur, unspecified: Secondary | ICD-10-CM
